# Patient Record
Sex: Female | Born: 1970 | Race: Black or African American | Hispanic: No | Marital: Single | Smoking: Never smoker
Health system: Southern US, Community
[De-identification: ages and names within clinical notes are randomized; demographics above are authoritative.]

## PROBLEM LIST (undated history)

## (undated) DIAGNOSIS — M069 Rheumatoid arthritis, unspecified: Secondary | ICD-10-CM

## (undated) DIAGNOSIS — A6009 Herpesviral infection of other urogenital tract: Secondary | ICD-10-CM

## (undated) DIAGNOSIS — J45909 Unspecified asthma, uncomplicated: Secondary | ICD-10-CM

## (undated) DIAGNOSIS — E119 Type 2 diabetes mellitus without complications: Secondary | ICD-10-CM

## (undated) DIAGNOSIS — T7840XA Allergy, unspecified, initial encounter: Secondary | ICD-10-CM

## (undated) DIAGNOSIS — I10 Essential (primary) hypertension: Secondary | ICD-10-CM

## (undated) HISTORY — DX: Unspecified asthma, uncomplicated: J45.909

## (undated) HISTORY — DX: Type 2 diabetes mellitus without complications: E11.9

## (undated) HISTORY — PX: DENTAL SURGERY: SHX609

## (undated) HISTORY — DX: Allergy, unspecified, initial encounter: T78.40XA

## (undated) HISTORY — PX: OVARY SURGERY: SHX727

---

## 1997-12-06 ENCOUNTER — Emergency Department (HOSPITAL_COMMUNITY): Admission: EM | Admit: 1997-12-06 | Discharge: 1997-12-07 | Payer: Self-pay | Admitting: Emergency Medicine

## 1998-09-12 ENCOUNTER — Ambulatory Visit (HOSPITAL_COMMUNITY): Admission: RE | Admit: 1998-09-12 | Discharge: 1998-09-12 | Payer: Self-pay | Admitting: Obstetrics and Gynecology

## 1998-09-12 ENCOUNTER — Encounter: Payer: Self-pay | Admitting: Obstetrics and Gynecology

## 1998-09-29 ENCOUNTER — Emergency Department (HOSPITAL_COMMUNITY): Admission: EM | Admit: 1998-09-29 | Discharge: 1998-09-29 | Payer: Self-pay | Admitting: Emergency Medicine

## 1999-01-24 ENCOUNTER — Emergency Department (HOSPITAL_COMMUNITY): Admission: EM | Admit: 1999-01-24 | Discharge: 1999-01-24 | Payer: Self-pay

## 1999-10-09 ENCOUNTER — Other Ambulatory Visit: Admission: RE | Admit: 1999-10-09 | Discharge: 1999-10-09 | Payer: Self-pay | Admitting: Obstetrics and Gynecology

## 2001-06-12 ENCOUNTER — Encounter: Admission: RE | Admit: 2001-06-12 | Discharge: 2001-06-12 | Payer: Self-pay | Admitting: Rheumatology

## 2001-06-12 ENCOUNTER — Encounter: Payer: Self-pay | Admitting: Rheumatology

## 2002-04-02 ENCOUNTER — Encounter: Payer: Self-pay | Admitting: Emergency Medicine

## 2002-04-02 ENCOUNTER — Emergency Department (HOSPITAL_COMMUNITY): Admission: EM | Admit: 2002-04-02 | Discharge: 2002-04-02 | Payer: Self-pay | Admitting: Emergency Medicine

## 2002-10-09 ENCOUNTER — Other Ambulatory Visit: Admission: RE | Admit: 2002-10-09 | Discharge: 2002-10-09 | Payer: Self-pay | Admitting: Family Medicine

## 2002-12-25 ENCOUNTER — Encounter: Payer: Self-pay | Admitting: Emergency Medicine

## 2002-12-25 ENCOUNTER — Emergency Department (HOSPITAL_COMMUNITY): Admission: EM | Admit: 2002-12-25 | Discharge: 2002-12-25 | Payer: Self-pay | Admitting: Emergency Medicine

## 2003-04-16 ENCOUNTER — Emergency Department (HOSPITAL_COMMUNITY): Admission: EM | Admit: 2003-04-16 | Discharge: 2003-04-17 | Payer: Self-pay | Admitting: Emergency Medicine

## 2004-02-26 ENCOUNTER — Other Ambulatory Visit: Admission: RE | Admit: 2004-02-26 | Discharge: 2004-02-26 | Payer: Self-pay | Admitting: Family Medicine

## 2004-12-24 ENCOUNTER — Other Ambulatory Visit: Admission: RE | Admit: 2004-12-24 | Discharge: 2004-12-24 | Payer: Self-pay | Admitting: Gynecology

## 2005-06-07 ENCOUNTER — Other Ambulatory Visit: Admission: RE | Admit: 2005-06-07 | Discharge: 2005-06-07 | Payer: Self-pay | Admitting: Gynecology

## 2005-11-24 ENCOUNTER — Other Ambulatory Visit: Admission: RE | Admit: 2005-11-24 | Discharge: 2005-11-24 | Payer: Self-pay | Admitting: Gynecology

## 2006-08-21 ENCOUNTER — Emergency Department (HOSPITAL_COMMUNITY): Admission: EM | Admit: 2006-08-21 | Discharge: 2006-08-21 | Payer: Self-pay | Admitting: Emergency Medicine

## 2006-09-02 ENCOUNTER — Encounter: Admission: RE | Admit: 2006-09-02 | Discharge: 2006-09-02 | Payer: Self-pay | Admitting: Family Medicine

## 2007-02-01 ENCOUNTER — Encounter: Admission: RE | Admit: 2007-02-01 | Discharge: 2007-04-12 | Payer: Self-pay | Admitting: Family Medicine

## 2007-04-11 ENCOUNTER — Ambulatory Visit (HOSPITAL_COMMUNITY): Admission: RE | Admit: 2007-04-11 | Discharge: 2007-04-11 | Payer: Self-pay | Admitting: Surgery

## 2007-04-20 ENCOUNTER — Encounter: Admission: RE | Admit: 2007-04-20 | Discharge: 2007-04-20 | Payer: Self-pay | Admitting: Surgery

## 2007-04-25 ENCOUNTER — Ambulatory Visit (HOSPITAL_COMMUNITY): Admission: RE | Admit: 2007-04-25 | Discharge: 2007-04-25 | Payer: Self-pay | Admitting: Surgery

## 2007-05-10 ENCOUNTER — Encounter: Admission: RE | Admit: 2007-05-10 | Discharge: 2007-06-07 | Payer: Self-pay | Admitting: Family Medicine

## 2007-08-17 ENCOUNTER — Encounter: Admission: RE | Admit: 2007-08-17 | Discharge: 2007-08-17 | Payer: Self-pay | Admitting: Family Medicine

## 2007-12-22 ENCOUNTER — Emergency Department (HOSPITAL_COMMUNITY): Admission: EM | Admit: 2007-12-22 | Discharge: 2007-12-22 | Payer: Self-pay | Admitting: Family Medicine

## 2008-03-18 ENCOUNTER — Emergency Department (HOSPITAL_COMMUNITY): Admission: EM | Admit: 2008-03-18 | Discharge: 2008-03-18 | Payer: Self-pay | Admitting: Family Medicine

## 2008-05-08 ENCOUNTER — Emergency Department (HOSPITAL_COMMUNITY): Admission: EM | Admit: 2008-05-08 | Discharge: 2008-05-08 | Payer: Self-pay | Admitting: Family Medicine

## 2008-08-14 ENCOUNTER — Emergency Department (HOSPITAL_COMMUNITY): Admission: EM | Admit: 2008-08-14 | Discharge: 2008-08-14 | Payer: Self-pay | Admitting: Family Medicine

## 2008-11-13 ENCOUNTER — Emergency Department (HOSPITAL_COMMUNITY): Admission: EM | Admit: 2008-11-13 | Discharge: 2008-11-13 | Payer: Self-pay | Admitting: Family Medicine

## 2009-01-09 ENCOUNTER — Encounter: Admission: RE | Admit: 2009-01-09 | Discharge: 2009-01-09 | Payer: Self-pay | Admitting: Family Medicine

## 2009-05-22 ENCOUNTER — Encounter: Admission: RE | Admit: 2009-05-22 | Discharge: 2009-05-22 | Payer: Self-pay | Admitting: Family Medicine

## 2009-06-16 ENCOUNTER — Emergency Department (HOSPITAL_COMMUNITY): Admission: EM | Admit: 2009-06-16 | Discharge: 2009-06-16 | Payer: Self-pay | Admitting: Emergency Medicine

## 2009-09-17 ENCOUNTER — Encounter: Admission: RE | Admit: 2009-09-17 | Discharge: 2009-09-17 | Payer: Self-pay | Admitting: Family Medicine

## 2010-01-22 ENCOUNTER — Encounter: Admission: RE | Admit: 2010-01-22 | Discharge: 2010-01-22 | Payer: Self-pay | Admitting: Family Medicine

## 2010-01-22 ENCOUNTER — Emergency Department (HOSPITAL_COMMUNITY): Admission: EM | Admit: 2010-01-22 | Discharge: 2010-01-22 | Payer: Self-pay | Admitting: Emergency Medicine

## 2010-01-28 ENCOUNTER — Encounter: Admission: RE | Admit: 2010-01-28 | Discharge: 2010-01-28 | Payer: Self-pay | Admitting: Family Medicine

## 2010-02-09 ENCOUNTER — Encounter: Admission: RE | Admit: 2010-02-09 | Discharge: 2010-02-09 | Payer: Self-pay | Admitting: Family Medicine

## 2010-04-03 ENCOUNTER — Inpatient Hospital Stay (HOSPITAL_COMMUNITY)
Admission: EM | Admit: 2010-04-03 | Discharge: 2010-04-08 | Payer: Self-pay | Source: Home / Self Care | Attending: Internal Medicine | Admitting: Internal Medicine

## 2010-04-03 ENCOUNTER — Encounter (INDEPENDENT_AMBULATORY_CARE_PROVIDER_SITE_OTHER): Payer: Self-pay | Admitting: Cardiology

## 2010-04-29 ENCOUNTER — Encounter
Admission: RE | Admit: 2010-04-29 | Discharge: 2010-04-29 | Payer: Self-pay | Source: Home / Self Care | Attending: Family Medicine | Admitting: Family Medicine

## 2010-05-03 ENCOUNTER — Encounter: Payer: Self-pay | Admitting: Rheumatology

## 2010-05-03 ENCOUNTER — Encounter: Payer: Self-pay | Admitting: Surgery

## 2010-05-06 ENCOUNTER — Encounter: Payer: Self-pay | Admitting: Internal Medicine

## 2010-05-06 ENCOUNTER — Encounter
Admission: RE | Admit: 2010-05-06 | Discharge: 2010-05-06 | Payer: Self-pay | Source: Home / Self Care | Attending: Family Medicine | Admitting: Family Medicine

## 2010-05-06 ENCOUNTER — Inpatient Hospital Stay (HOSPITAL_COMMUNITY)
Admission: EM | Admit: 2010-05-06 | Discharge: 2010-05-08 | Payer: Self-pay | Source: Home / Self Care | Attending: Internal Medicine | Admitting: Internal Medicine

## 2010-05-06 LAB — CBC
HCT: 29.6 % — ABNORMAL LOW (ref 36.0–46.0)
Hemoglobin: 9.6 g/dL — ABNORMAL LOW (ref 12.0–15.0)
MCHC: 32.4 g/dL (ref 30.0–36.0)
MCV: 80.9 fL (ref 78.0–100.0)
RDW: 17 % — ABNORMAL HIGH (ref 11.5–15.5)

## 2010-05-06 LAB — DIFFERENTIAL
Basophils Relative: 0 % (ref 0–1)
Eosinophils Relative: 0 % (ref 0–5)
Lymphs Abs: 2.4 10*3/uL (ref 0.7–4.0)
Monocytes Absolute: 2.1 10*3/uL — ABNORMAL HIGH (ref 0.1–1.0)
Monocytes Relative: 13 % — ABNORMAL HIGH (ref 3–12)

## 2010-05-06 LAB — COMPREHENSIVE METABOLIC PANEL
ALT: 15 U/L (ref 0–35)
AST: 32 U/L (ref 0–37)
Albumin: 3.1 g/dL — ABNORMAL LOW (ref 3.5–5.2)
Alkaline Phosphatase: 90 U/L (ref 39–117)
Calcium: 8.5 mg/dL (ref 8.4–10.5)
GFR calc Af Amer: >60 mL/min (ref >60)
Glucose, Bld: 116 mg/dL — ABNORMAL HIGH (ref 70–99)
Potassium: 3.4 mEq/L — ABNORMAL LOW (ref 3.5–5.1)
Sodium: 141 mEq/L (ref 135–145)
Total Protein: 9.1 g/dL — ABNORMAL HIGH (ref 6.0–8.3)

## 2010-05-07 LAB — CARDIAC PANEL(CRET KIN+CKTOT+MB+TROPI)
Relative Index: INVALID (ref 0.0–2.5)
Relative Index: INVALID (ref 0.0–2.5)
Total CK: 89 U/L (ref 7–177)
Total CK: 80 U/L (ref 7–177)
Troponin I: 0.02 ng/mL (ref 0.00–0.06)

## 2010-05-07 LAB — CBC
HCT: 26.9 % — ABNORMAL LOW (ref 36.0–46.0)
Hemoglobin: 8.5 g/dL — ABNORMAL LOW (ref 12.0–15.0)
RBC: 3.33 MIL/uL — ABNORMAL LOW (ref 3.87–5.11)

## 2010-05-07 LAB — MAGNESIUM: Magnesium: 1.7 mg/dL (ref 1.5–2.5)

## 2010-05-07 LAB — DIFFERENTIAL
Basophils Absolute: 0 10*3/uL (ref 0.0–0.1)
Basophils Relative: 0 % (ref 0–1)
Lymphocytes Relative: 10 % — ABNORMAL LOW (ref 12–46)
Monocytes Relative: 8 % (ref 3–12)
Neutro Abs: 10.9 10*3/uL — ABNORMAL HIGH (ref 1.7–7.7)
Neutrophils Relative %: 81 % — ABNORMAL HIGH (ref 43–77)

## 2010-05-07 LAB — PHOSPHORUS: Phosphorus: 2.4 mg/dL (ref 2.3–4.6)

## 2010-05-07 LAB — BASIC METABOLIC PANEL
Chloride: 108 mEq/L (ref 96–112)
GFR calc non Af Amer: >60 mL/min (ref >60)
Potassium: 3.1 mEq/L — ABNORMAL LOW (ref 3.5–5.1)
Sodium: 141 mEq/L (ref 135–145)

## 2010-05-07 LAB — LACTIC ACID, PLASMA: Lactic Acid, Venous: 0.9 mmol/L (ref 0.5–2.2)

## 2010-05-08 LAB — COMPREHENSIVE METABOLIC PANEL
BUN: 10 mg/dL (ref 6–23)
CO2: 23 mEq/L (ref 19–32)
Calcium: 7.9 mg/dL — ABNORMAL LOW (ref 8.4–10.5)
Creatinine, Ser: 0.67 mg/dL (ref 0.4–1.2)
GFR calc non Af Amer: >60 mL/min (ref >60)
Glucose, Bld: 296 mg/dL — ABNORMAL HIGH (ref 70–99)
Sodium: 138 mEq/L (ref 135–145)
Total Protein: 7.6 g/dL (ref 6.0–8.3)

## 2010-05-08 LAB — DIFFERENTIAL
Basophils Absolute: 0 10*3/uL (ref 0.0–0.1)
Eosinophils Relative: 0 % (ref 0–5)
Lymphocytes Relative: 7 % — ABNORMAL LOW (ref 12–46)
Monocytes Absolute: 0.7 10*3/uL (ref 0.1–1.0)
Monocytes Relative: 5 % (ref 3–12)

## 2010-05-08 LAB — CBC
HCT: 26.2 % — ABNORMAL LOW (ref 36.0–46.0)
MCH: 24.8 pg — ABNORMAL LOW (ref 26.0–34.0)
MCHC: 30.2 g/dL (ref 30.0–36.0)
RDW: 17.1 % — ABNORMAL HIGH (ref 11.5–15.5)

## 2010-05-08 LAB — MAGNESIUM: Magnesium: 1.9 mg/dL (ref 1.5–2.5)

## 2010-05-08 LAB — PHOSPHORUS: Phosphorus: 2.2 mg/dL — ABNORMAL LOW (ref 2.3–4.6)

## 2010-05-08 NOTE — Discharge Summary (Signed)
[name], [name]                 ACCOUNT NO.:  [id number]  MEDICAL RECORD NO.:  [id number]          PATIENT TYPE:  INP  LOCATION:  1443                         FACILITY:  [facility]  PHYSICIAN:  Rock Nephew, MD       DATE OF BIRTH:  05-17-1970  DATE OF ADMISSION:  05/06/2010 DATE OF DISCHARGE:  05/08/2010                        DISCHARGE SUMMARY - REFERRING   PRIMARY CARE PHYSICIAN:  The patient's primary care physician is Carola J. Gerri Spore, M.D.  CARDIOLOGIST:  Armanda Magic, M.D.  DISCHARGE DIAGNOSES:  The patient's discharge diagnosis are as follows: 1. Bilateral pneumonia with small left pleural effusion. 2. Morbid obesity. 3. History of rheumatoid arthritis and lupus. 4. History of asthma. 5. History of chronic back pain. 6. History of gastroesophageal reflux disease. 7. History of pericardial effusion. 8. History of recent genital herpes.  DISCHARGE MEDICATIONS:  Discharge medications for the patient are as follows: 1. Levofloxacin 500 mg by mouth p.o. daily. 2. Prednisone taper 60 mg for 2 days, 50 mg for 2 days, 40 mg for 2     days, 30 mg for 2 days, 20 mg for 2 days, 10 mg for 2 days. 3. Acetaminophen 500 mg 2 to 3 tablets 2 tablets by mouth every 6     hours as needed for pain. 4. Acyclovir 200 mg p.o. daily. 5. Albuterol inhaler 2 puffs inhaled every 4 hours as needed for     shortness of breath. 6. Alka-Seltzer Plus Cold and Cough 2 tablets over-the-counter every 4     hours as needed. 7. Excedrin 2 to 3 tablets by mouth every 12 hours as needed. 8. Flonase 2 sprays nasally daily as needed. 9. Indomethacin 500 mg 1 capsule by mouth 3 times a day with meals. 10.Ibuprofen 600 mg by mouth 3 times a day as needed. 11.Mucinex 600 mg 1 tablet by mouth every 12 hours as needed. 12.Plaquenil 200 mg 1 tablet by mouth daily. 13.Promethazine with codeine 6.25/10 mg 5 mL by mouth every 6 hours as     needed. 14.Robaxin 500 mg 1 tablet by mouth every 6 hours as  needed. 15.Symbicort 80/4.5 mcg 2 puffs inhaled twice daily as needed. 16.Tramadol 50 mg 1 tablet by mouth every 6 hours as needed. 17.Veramyst 2 sprays inhaled every 4 hours as needed.  DISPOSITION:  The patient is discharged home.  DIET:  The patient's diet is regular.  PROCEDURES:  The patient had a CT angiogram of the chest which showed limited evaluation for pulmonary embolism secondary to the patient's body habitus.  No pulmonary embolism in the central or segmental pulmonary arteries.  Consolidation noted in the lingula and left lower lobe with left greater than right pleural effusion suggesting infection. Improved right lower lobe airspace disease, cardiomegaly, and small pericardial effusion, unchanged.  The patient also had a 2-D echocardiogram ordered that was cancelled by the echocardiogram department secondary to having an echocardiogram done in December 2011.  CONSULTATIONS:  Consultations on this case, none.  FOLLOWUP:  The patient should follow up with Dr. Otilio Connors. [place]ann in about 1 week.  The patient should follow up with Dr. Gloris Manchester  Turner in 1 to 2 weeks.  INITIAL HISTORY AND PHYSICAL:  Chief Complaint:  Shortness of breath. This 40 year old female who was recently admitted in December of this year with shortness of breath diagnosed with bilateral pneumonia and pleural as well as pericardial effusion.  She has a baseline history of lupus as well as rheumatoid arthritis.  At the time of discharge, the patient was doing fine.  She was discharged on 5 days of oral Avelox which she completed.  After completion, however, the patient continued to have recurrent shortness of breath and worsening symptoms.  She was referred back to Dr. Armanda Magic with possible recurrent pericardial effusion.  Evaluation there showed that she had no new pericardial effusion, rather she had another organizing pneumonia in her lungs.  The patient is subsequently admitted to the ED for  further management.  In the ED, her oxygen saturation dropped into 80s after being initially discharged home.  HOSPITAL COURSE: 1. Bilateral pneumonia with pleural effusion.  The patient was placed     on Levaquin as well as vancomycin.  The patient's leukocytosis     improved.  The patient was afebrile and seemed ready for discharge. 2. Morbid obesity.  The patient was counseled. 3. History of rheumatoid arthritis plus lupus.  The patient received     Solu-Medrol during the hospitalization.  She will go home on a     prednisone taper.  She takes Plaquenil at home for rheumatoid     arthritis and lupus. 4. History of asthma.  The patient is on nebulizations.  She also     received steroids. 5. Chronic back pain.  The patient has a history of chronic back pain     which is stable. 6. GERD.  The patient is on a PPI. 7. DVT prophylaxis.  The patient received Lovenox.  The patient also     has a history of genital herpes.  She takes acyclovir at home.  It     was also noted that the patient has more multiple medications that     have Tylenol in it.  The patient was instructed not to take more     than 4 g of Tylenol in a 24-hour period.  The patient's vital signs at time of discharge were temperature 97.6, pulse 81, respiratory rate 18, blood pressure 117/73, 92% saturation on room air.  Please note this is not an official document until electronically signed.     Rock Nephew, MD     NH/MEDQ  D:  05/08/2010  T:  05/08/2010  Job:  [id number]  cc:   Otilio Connors. Gerri Spore, M.D. Fax: [phone]  Armanda Magic, M.D. Fax: [phone]  Electronically Signed by Rock Nephew MD on 05/08/2010 03:49:59 PM

## 2010-05-14 LAB — CULTURE, BLOOD (ROUTINE X 2)
Culture  Setup Time: 201201271731
Culture: NO GROWTH

## 2010-05-18 ENCOUNTER — Other Ambulatory Visit: Payer: Self-pay | Admitting: Family Medicine

## 2010-05-18 ENCOUNTER — Ambulatory Visit
Admission: RE | Admit: 2010-05-18 | Discharge: 2010-05-18 | Disposition: A | Discharge status: Home / Self Care | Payer: Medicare PPO | Source: Ambulatory Visit | Attending: Family Medicine | Admitting: Family Medicine

## 2010-05-18 DIAGNOSIS — J189 Pneumonia, unspecified organism: Secondary | ICD-10-CM

## 2010-05-27 ENCOUNTER — Other Ambulatory Visit: Payer: Self-pay | Admitting: Family Medicine

## 2010-05-27 ENCOUNTER — Ambulatory Visit
Admission: RE | Admit: 2010-05-27 | Discharge: 2010-05-27 | Disposition: A | Discharge status: Home / Self Care | Payer: Medicare PPO | Source: Ambulatory Visit | Attending: Family Medicine | Admitting: Family Medicine

## 2010-05-27 ENCOUNTER — Encounter: Payer: Self-pay | Admitting: Internal Medicine

## 2010-05-27 DIAGNOSIS — R05 Cough: Secondary | ICD-10-CM

## 2010-05-27 DIAGNOSIS — R059 Cough, unspecified: Secondary | ICD-10-CM

## 2010-06-04 DIAGNOSIS — J45909 Unspecified asthma, uncomplicated: Secondary | ICD-10-CM | POA: Insufficient documentation

## 2010-06-05 ENCOUNTER — Institutional Professional Consult (permissible substitution) (INDEPENDENT_AMBULATORY_CARE_PROVIDER_SITE_OTHER): Payer: Medicare PPO | Admitting: Internal Medicine

## 2010-06-05 ENCOUNTER — Other Ambulatory Visit: Payer: Medicare PPO

## 2010-06-05 ENCOUNTER — Other Ambulatory Visit: Payer: Self-pay | Admitting: Internal Medicine

## 2010-06-05 ENCOUNTER — Encounter: Payer: Self-pay | Admitting: Internal Medicine

## 2010-06-05 DIAGNOSIS — J45909 Unspecified asthma, uncomplicated: Secondary | ICD-10-CM

## 2010-06-05 LAB — SEDIMENTATION RATE: Sed Rate: 58 mm/hr — ABNORMAL HIGH (ref 0–22)

## 2010-06-05 LAB — CBC WITH DIFFERENTIAL/PLATELET
Basophils Absolute: 0.1 10*3/uL (ref 0.0–0.1)
Eosinophils Relative: 5.3 % — ABNORMAL HIGH (ref 0.0–5.0)
HCT: 30.9 % — ABNORMAL LOW (ref 36.0–46.0)
Hemoglobin: 10.4 g/dL — ABNORMAL LOW (ref 12.0–15.0)
Lymphocytes Relative: 30.5 % (ref 12.0–46.0)
Lymphs Abs: 2.4 10*3/uL (ref 0.7–4.0)
Monocytes Relative: 12.3 % — ABNORMAL HIGH (ref 3.0–12.0)
Neutro Abs: 3.9 10*3/uL (ref 1.4–7.7)
RDW: 19.8 % — ABNORMAL HIGH (ref 11.5–14.6)
WBC: 7.7 10*3/uL (ref 4.5–10.5)

## 2010-06-08 ENCOUNTER — Telehealth: Payer: Self-pay | Admitting: Internal Medicine

## 2010-06-09 NOTE — Assessment & Plan Note (Signed)
Summary: Pulmonary/ new pt eval  > hfa 75% p coaching   Visit Type:  Initial Consult Copy to:  Dr. Armanda Magic Primary Provider/Referring Provider:  Dr. Clyde Canterbury  CC:  Recurrent PNA.  History of Present Illness: 40 yobf never smoker with dx of asthma as teenager requiring intermittent treatment since then.  June 05, 2010  1st pulmonary office eval ov cc abupt onset in early  December 2011 with severe dyspnea diifferent because didn't respond and began having chest tightness and cough > yellow hospitalized Apr 04 2011 admit [facility] x one week then better then readmitted 3 weeks later [facility] then better  x still felt a little tight then worse again starting x one week cough coming back and low fever rx avelox x 5 days.    Presently Pt denies any significant sore throat, dysphagia, itching, sneezing,  nasal congestion or excess secretions,  fever, chills, sweats, unintended wt loss, pleuritic or exertional cp, hempoptysis, change in activity tolerance  orthopnea pnd or leg swelling Pt also denies any obvious fluctuation in symptoms with weather or environmental change or other alleviating or aggravating factors.       Current Medications (verified): 1)  Acyclovir 200 Mg Caps (Acyclovir) .Marland Kitchen.. 1 Once Daily 2)  Tramadol Hcl 50 Mg Tabs (Tramadol Hcl) .Marland Kitchen.. 1 Every 6 Hrs As Needed 3)  Nexium 40 Mg Cpdr (Esomeprazole Magnesium) .Marland Kitchen.. 1 Once Daily As Needed 4)  Mucinex 600 Mg Xr12h-Tab (Guaifenesin) .Marland Kitchen.. 1 Every 12 Hrs As Neeed 5)  Symbicort 80-4.5 Mcg/act Aero (Budesonide-Formoterol Fumarate) .... 2 Puffs Two Times A Day As Needed 6)  Promethazine-Codeine 6.25-10 Mg/20ml Syrp (Promethazine-Codeine) .Marland Kitchen.. 1 Tsp Every 6 Hrs As Needed 7)  Avelox 400 Mg Tabs (Moxifloxacin Hcl) .Marland Kitchen.. 1 Once Daily X 10 Days- Per Dr Mayford Knife 8)  Plaquenil 200 Mg Tabs (Hydroxychloroquine Sulfate) .... 2 At Bedtime 9)  Veramyst 27.5 Mcg/spray Susp (Fluticasone Furoate) .... 2 Sprays Each Nostril Once Daily 10)  Flonase 50 Mcg/act  Susp (Fluticasone Propionate) .... 2 Sprays Each Nostril Once Daily 11)  Tessalon Perles 100 Mg Caps (Benzonatate) .Marland Kitchen.. 1 Every 8 Hrs As Needed For Cough  Allergies (verified): 1)  ! Pcn 2)  ! Biaxin 3)  ! Hydrocodone  Past History:  Past Medical History: Allergies  Asthma     - HFA 50% June 05, 2010  Genital Herpes RA-Sees [facility]    Family History: Heart dz- Mother DM- Mother Breast CA- Mother  Social History: Never smoker No ETOH [place] No children Disabled Lives with Mother  Review of Systems       The patient complains of shortness of breath with activity, shortness of breath at rest, productive cough, chest pain, irregular heartbeats, weight change, abdominal pain, headaches, sneezing, and joint stiffness or pain.  The patient denies non-productive cough, coughing up blood, acid heartburn, indigestion, loss of appetite, difficulty swallowing, sore throat, tooth/dental problems, nasal congestion/difficulty breathing through nose, itching, ear ache, anxiety, depression, hand/feet swelling, rash, change in color of mucus, and fever.    Vital Signs:  Patient profile:   40 year old female Height:      61 inches Weight:      250 pounds BMI:     47.41 O2 Sat:      96 % on Room air Temp:     98.9 degrees F oral Pulse rate:   94 / minute BP sitting:   160 / 98  (left arm)  Vitals Entered By: Vernie Murders (June 05, 2010 10:48  AM)  O2 Flow:  Room air  Physical Exam  Additional Exam:  obese very cushingnoid appearing bf nad wt 250 June 05, 2010 HEENT: nl dentition, turbinates, and orophanx. Nl external ear canals without cough reflex NECK :  without JVD/Nodes/TM/ nl carotid upstrokes bilaterally LUNGS: no acc muscle use, clear to A and P bilaterally without cough on insp or exp maneuvers CV:  RRR  no s3 or murmur or increase in P2, no edema   ABD:  soft and nontender with nl excursion in the supine position. No bruits or organomegaly, bowel sounds  nl MS:  warm without deformities, calf tenderness, cyanosis or clubbing SKIN: warm and dry without lesions   NEURO:  alert, approp, no deficits     White Cell Count          7.7 K/uL                    4.5-10.5   Red Cell Count       [L]  3.83 Mil/uL                 3.87-5.11   Hemoglobin           [L]  10.4 g/dL                   91.4-78.2   Hematocrit           [L]  30.9 %                      36.0-46.0   MCV                       80.6 fl                     78.0-100.0   MCHC                      33.7 g/dL                   95.6-21.3   RDW                  [H]  19.8 %                      11.5-14.6   Platelet Count       [H]  593.0 K/uL                  150.0-400.0   Neutrophil %              50.2 %                      43.0-77.0   Lymphocyte %              30.5 %                      12.0-46.0   Monocyte %           [H]  12.3 %                      3.0-12.0   Eosinophils%         [H]  5.3 %  0.0-5.0   Basophils %               1.7 %                       0.0-3.0   Neutrophill Absolute      3.9 K/uL                    1.4-7.7   Lymphocyte Absolute       2.4 K/uL                    0.7-4.0   Monocyte Absolute         1.0 K/uL                    0.1-1.0  Eosinophils, Absolute                             0.4 K/uL                    0.0-0.7   Basophils Absolute        0.1 K/uL                    0.0-0.1  Tests: (2) Sed Rate (ESR)   Sed Rate             [H]  58 mm/hr                    0-22  CXR  Procedure date:  06/05/2010  Findings:        Comparison: Chest x-ray of 05/18/2010    Findings: The lungs are clear.  Mediastinal contours appear normal.   The heart is within upper limits of normal.  No bony abnormality is   seen.    IMPRESSION:   No active lung disease.  Stable chest x-ray.    Impression & Recommendations:  Problem # 1:  ASTHMA (ICD-493.90)  DDX of  difficult airways managment all start with A and  include Adherence, Ace Inhibitors, Acid  Reflux, Active Sinus Disease, Alpha 1 Antitripsin deficiency, Anxiety masquerading as Airways dz,  ABPA,  allergy(esp in young), Aspiration (esp in elderly), Adverse effects of DPI,  Active smokers, plus two Bs  = Bronchiectasis and Beta blocker use..and one C= CHF    Also in ddx is RA assoc lung dz, both bronchiolits and BOOP - does not appear to have ILD  Adherence: I spent extra time with the patient today explaining optimal mdi  technique.  This improved from  50-75% p coaching  ? acid reflux esp given atypical assoc cp/ chest tightness : See instructions for specific recommendations    Each maintenance medication was reviewed in detail including most importantly the difference between maintenance and as needed and under what circumstances the prns are to be used. struggling with concept of medication reconciliation.   To keep things simple, I have asked the patient to first separate medicines that are perceived as maintenance, that is to be taken daily "no matter what", from those medicines that are taken on only on an as-needed basis and I have given the patient examples of both, and then return to see our NP to generate a  detailed  medication calendar which should be followed until the next physician sees the patient and updates it.   Once we're sure that we're all reading  from the same page in terms of medication admiistration, she needs to be scheduled to follow up with me with pft's  Medications Added to Medication List This Visit: 1)  Acyclovir 200 Mg Caps (Acyclovir) .Marland Kitchen.. 1 once daily 2)  Nexium 40 Mg Cpdr (Esomeprazole magnesium) .... Take  one 30-60 min before first meal of the day 3)  Nexium 40 Mg Cpdr (Esomeprazole magnesium) .Marland Kitchen.. 1 once daily as needed 4)  Symbicort 80-4.5 Mcg/act Aero (Budesonide-formoterol fumarate) .... 2 puffs two times a day as needed 5)  Plaquenil 200 Mg Tabs (Hydroxychloroquine sulfate) .... 2 at bedtime 6)  Veramyst 27.5 Mcg/spray Susp (Fluticasone furoate)  .... 2 sprays each nostril once daily 7)  Flonase 50 Mcg/act Susp (Fluticasone propionate) .... 2 sprays each nostril once daily 8)  Tessalon Perles 100 Mg Caps (Benzonatate) .Marland Kitchen.. 1 every 8 hrs as needed for cough 9)  Tramadol Hcl 50 Mg Tabs (Tramadol hcl) .Marland Kitchen.. 1 every 6 hrs as needed 10)  Tramadol Hcl 50 Mg Tabs (Tramadol hcl) .Marland Kitchen.. 1-2 up to every 4 hours for bad cough or chest pain 11)  Mucinex 600 Mg Xr12h-tab (Guaifenesin) .Marland Kitchen.. 1 every 12 hrs as neeed 12)  Promethazine-codeine 6.25-10 Mg/10ml Syrp (Promethazine-codeine) .Marland Kitchen.. 1 tsp every 6 hrs as needed 13)  Pepcid 20 Mg Tabs (Famotidine) .... Take one by mouth at bedtime 14)  Avelox 400 Mg Tabs (Moxifloxacin hcl) .Marland Kitchen.. 1 once daily x 10 days- per dr turner 15)  Prednisone 10 Mg Tabs (Prednisone) .... 4 each am x 2days, 2x2days, 1x2days and stop  Other Orders: TLB-CBC Platelet - w/Differential (85025-CBCD) TLB-Sedimentation Rate (ESR) (85652-ESR) New Patient Level V (16109)  Patient Instructions: 1)  Pepcid 20 mg at bedtime 2)  GERD (REFLUX)  is a common cause of respiratory symptoms. It commonly presents without heartburn and can be treated with medication, but also with lifestyle changes including avoidance of late meals, excessive alcohol, smoking cessation, and avoid fatty foods, chocolate, peppermint, colas, red wine, and acidic juices such as orange juice. NO MINT OR MENTHOL PRODUCTS SO NO COUGH DROPS  3)  USE SUGARLESS CANDY INSTEAD (jolley ranchers)  4)  NO OIL BASED VITAMINS  5)   Think of your medications in 3 separate categories and keep them separate:  6)  a) The ones you take no matter what daily on a scheduled basis 7)  b)  The ones you only take if needed for specific problems 8)  c) The ones you take for a short course and stop, like antibiotics and prednisone. 9)  If condition worsens take prednisone  4 each am x 2days, 2x2days, 1x2days and stop  10)  See Tammy NP w/in 1 weeks with all your medications, even over the  counter meds, separated in two separate bags, the ones you take no matter what vs the ones you stop once you feel better and take only as needed.  She will generate for you a new user friendly medication calendar that will put [place] all on the same page re: your medication use.    CXR  Procedure date:  06/05/2010  Findings:        Comparison: Chest x-ray of 05/18/2010    Findings: The lungs are clear.  Mediastinal contours appear normal.   The heart is within upper limits of normal.  No bony abnormality is   seen.    IMPRESSION:   No active lung disease.  Stable chest x-ray.

## 2010-06-12 ENCOUNTER — Encounter: Payer: Medicare PPO | Admitting: Adult Health

## 2010-06-15 ENCOUNTER — Inpatient Hospital Stay (HOSPITAL_COMMUNITY)
Admission: EM | Admit: 2010-06-15 | Discharge: 2010-06-22 | DRG: 196 | Disposition: A | Discharge status: Home / Self Care | Payer: Medicare PPO | Attending: Internal Medicine | Admitting: Internal Medicine

## 2010-06-15 ENCOUNTER — Emergency Department (HOSPITAL_COMMUNITY): Payer: Medicare PPO

## 2010-06-15 DIAGNOSIS — R079 Chest pain, unspecified: Secondary | ICD-10-CM

## 2010-06-15 DIAGNOSIS — I509 Heart failure, unspecified: Secondary | ICD-10-CM | POA: Diagnosis present

## 2010-06-15 DIAGNOSIS — M0510 Rheumatoid lung disease with rheumatoid arthritis of unspecified site: Principal | ICD-10-CM | POA: Diagnosis present

## 2010-06-15 DIAGNOSIS — E876 Hypokalemia: Secondary | ICD-10-CM | POA: Diagnosis present

## 2010-06-15 DIAGNOSIS — M3500 Sicca syndrome, unspecified: Secondary | ICD-10-CM | POA: Diagnosis present

## 2010-06-15 DIAGNOSIS — R918 Other nonspecific abnormal finding of lung field: Secondary | ICD-10-CM | POA: Diagnosis present

## 2010-06-15 DIAGNOSIS — M0560 Rheumatoid arthritis of unspecified site with involvement of other organs and systems: Secondary | ICD-10-CM | POA: Diagnosis present

## 2010-06-15 DIAGNOSIS — R059 Cough, unspecified: Secondary | ICD-10-CM | POA: Diagnosis present

## 2010-06-15 DIAGNOSIS — I5023 Acute on chronic systolic (congestive) heart failure: Secondary | ICD-10-CM | POA: Diagnosis present

## 2010-06-15 DIAGNOSIS — J90 Pleural effusion, not elsewhere classified: Secondary | ICD-10-CM | POA: Diagnosis present

## 2010-06-15 DIAGNOSIS — R000 Tachycardia, unspecified: Secondary | ICD-10-CM | POA: Diagnosis present

## 2010-06-15 DIAGNOSIS — R05 Cough: Secondary | ICD-10-CM | POA: Diagnosis present

## 2010-06-15 DIAGNOSIS — R6510 Systemic inflammatory response syndrome (SIRS) of non-infectious origin without acute organ dysfunction: Secondary | ICD-10-CM | POA: Diagnosis present

## 2010-06-15 DIAGNOSIS — R509 Fever, unspecified: Secondary | ICD-10-CM

## 2010-06-15 DIAGNOSIS — J45909 Unspecified asthma, uncomplicated: Secondary | ICD-10-CM | POA: Diagnosis present

## 2010-06-15 DIAGNOSIS — M329 Systemic lupus erythematosus, unspecified: Secondary | ICD-10-CM | POA: Diagnosis present

## 2010-06-15 DIAGNOSIS — A6000 Herpesviral infection of urogenital system, unspecified: Secondary | ICD-10-CM | POA: Diagnosis present

## 2010-06-15 DIAGNOSIS — K219 Gastro-esophageal reflux disease without esophagitis: Secondary | ICD-10-CM | POA: Diagnosis present

## 2010-06-15 DIAGNOSIS — R197 Diarrhea, unspecified: Secondary | ICD-10-CM | POA: Diagnosis not present

## 2010-06-15 DIAGNOSIS — R0902 Hypoxemia: Secondary | ICD-10-CM | POA: Diagnosis present

## 2010-06-15 LAB — BASIC METABOLIC PANEL
Chloride: 99 mEq/L (ref 96–112)
Potassium: 4.2 mEq/L (ref 3.5–5.1)
Sodium: 136 mEq/L (ref 135–145)

## 2010-06-15 LAB — URINALYSIS, ROUTINE W REFLEX MICROSCOPIC
Bilirubin Urine: NEGATIVE
Glucose, UA: NEGATIVE mg/dL
Hgb urine dipstick: NEGATIVE
Specific Gravity, Urine: >1.046 — ABNORMAL HIGH (ref 1.005–1.030)
pH: 5.5 (ref 5.0–8.0)

## 2010-06-15 LAB — DIFFERENTIAL
Basophils Absolute: 0 10*3/uL (ref 0.0–0.1)
Eosinophils Relative: 0 % (ref 0–5)
Lymphocytes Relative: 27 % (ref 12–46)
Lymphs Abs: 2.9 10*3/uL (ref 0.7–4.0)
Monocytes Absolute: 1.2 10*3/uL — ABNORMAL HIGH (ref 0.1–1.0)
Neutro Abs: 6.8 10*3/uL (ref 1.7–7.7)

## 2010-06-15 LAB — LACTIC ACID, PLASMA: Lactic Acid, Venous: 1.5 mmol/L (ref 0.5–2.2)

## 2010-06-15 LAB — BLOOD GAS, ARTERIAL
Drawn by: 257701
O2 Content: 2 L/min
pCO2 arterial: 38.9 mmHg (ref 35.0–45.0)
pH, Arterial: 7.438 — ABNORMAL HIGH (ref 7.350–7.400)
pO2, Arterial: 77.1 mmHg — ABNORMAL LOW (ref 80.0–100.0)

## 2010-06-15 LAB — APTT: aPTT: 34 seconds (ref 24–37)

## 2010-06-15 LAB — BRAIN NATRIURETIC PEPTIDE: Pro B Natriuretic peptide (BNP): 199 pg/mL — ABNORMAL HIGH (ref 0.0–100.0)

## 2010-06-15 LAB — PROTIME-INR: INR: 1.11 (ref 0.00–1.49)

## 2010-06-15 LAB — CBC
HCT: 33.8 % — ABNORMAL LOW (ref 36.0–46.0)
Hemoglobin: 10.3 g/dL — ABNORMAL LOW (ref 12.0–15.0)
MCV: 85.6 fL (ref 78.0–100.0)
WBC: 11 10*3/uL — ABNORMAL HIGH (ref 4.0–10.5)

## 2010-06-15 LAB — CK TOTAL AND CKMB (NOT AT ARMC)
CK, MB: 0.2 ng/mL — ABNORMAL LOW (ref 0.3–4.0)
Relative Index: INVALID (ref 0.0–2.5)
Total CK: 92 U/L (ref 7–177)

## 2010-06-15 LAB — PROCALCITONIN: Procalcitonin: 0.28 ng/mL

## 2010-06-15 LAB — URINE MICROSCOPIC-ADD ON

## 2010-06-15 MED ORDER — IOHEXOL 300 MG/ML  SOLN
100.0000 mL | Freq: Once | INTRAMUSCULAR | Status: AC | PRN
  Administered 2010-06-15: 100 mL via INTRAVENOUS

## 2010-06-16 DIAGNOSIS — J984 Other disorders of lung: Secondary | ICD-10-CM

## 2010-06-16 DIAGNOSIS — A419 Sepsis, unspecified organism: Secondary | ICD-10-CM

## 2010-06-16 DIAGNOSIS — M329 Systemic lupus erythematosus, unspecified: Secondary | ICD-10-CM

## 2010-06-16 LAB — COMPREHENSIVE METABOLIC PANEL
AST: 19 U/L (ref 0–37)
Albumin: 3 g/dL — ABNORMAL LOW (ref 3.5–5.2)
Alkaline Phosphatase: 135 U/L — ABNORMAL HIGH (ref 39–117)
BUN: 10 mg/dL (ref 6–23)
Chloride: 102 mEq/L (ref 96–112)
Creatinine, Ser: 0.76 mg/dL (ref 0.4–1.2)
GFR calc Af Amer: >60 mL/min (ref >60)
Potassium: 3.3 mEq/L — ABNORMAL LOW (ref 3.5–5.1)
Total Protein: 8.8 g/dL — ABNORMAL HIGH (ref 6.0–8.3)

## 2010-06-16 LAB — RAPID URINE DRUG SCREEN, HOSP PERFORMED
Amphetamines: NOT DETECTED
Tetrahydrocannabinol: NOT DETECTED

## 2010-06-16 LAB — CARDIAC PANEL(CRET KIN+CKTOT+MB+TROPI)
Relative Index: INVALID (ref 0.0–2.5)
Relative Index: INVALID (ref 0.0–2.5)
Total CK: 90 U/L (ref 7–177)
Total CK: 67 U/L (ref 7–177)
Troponin I: 0.02 ng/mL (ref 0.00–0.06)

## 2010-06-16 LAB — URINE CULTURE: Culture: NO GROWTH

## 2010-06-16 LAB — URINALYSIS, ROUTINE W REFLEX MICROSCOPIC
Nitrite: NEGATIVE
Specific Gravity, Urine: 1.035 — ABNORMAL HIGH (ref 1.005–1.030)
Urobilinogen, UA: 1 mg/dL (ref 0.0–1.0)

## 2010-06-16 LAB — TSH: TSH: 3.114 u[IU]/mL (ref 0.350–4.500)

## 2010-06-16 LAB — CBC
Hemoglobin: 9.2 g/dL — ABNORMAL LOW (ref 12.0–15.0)
MCH: 25.6 pg — ABNORMAL LOW (ref 26.0–34.0)
Platelets: 530 10*3/uL — ABNORMAL HIGH (ref 150–400)
RBC: 3.59 MIL/uL — ABNORMAL LOW (ref 3.87–5.11)

## 2010-06-16 LAB — DIFFERENTIAL
Basophils Absolute: 0 10*3/uL (ref 0.0–0.1)
Basophils Relative: 0 % (ref 0–1)
Eosinophils Absolute: 0 10*3/uL (ref 0.0–0.7)
Monocytes Relative: 14 % — ABNORMAL HIGH (ref 3–12)
Neutro Abs: 7.5 10*3/uL (ref 1.7–7.7)
Neutrophils Relative %: 67 % (ref 43–77)

## 2010-06-16 LAB — MRSA PCR SCREENING: MRSA by PCR: POSITIVE — AB

## 2010-06-16 LAB — URINE MICROSCOPIC-ADD ON

## 2010-06-17 ENCOUNTER — Institutional Professional Consult (permissible substitution): Payer: Medicare PPO | Admitting: Pulmonary Disease

## 2010-06-17 LAB — PROCALCITONIN: Procalcitonin: 0.3 ng/mL

## 2010-06-17 LAB — BASIC METABOLIC PANEL
CO2: 24 mEq/L (ref 19–32)
Calcium: 8.3 mg/dL — ABNORMAL LOW (ref 8.4–10.5)
Sodium: 136 mEq/L (ref 135–145)

## 2010-06-17 LAB — CBC
Hemoglobin: 9.2 g/dL — ABNORMAL LOW (ref 12.0–15.0)
MCHC: 29.6 g/dL — ABNORMAL LOW (ref 30.0–36.0)
WBC: 13.6 10*3/uL — ABNORMAL HIGH (ref 4.0–10.5)

## 2010-06-17 LAB — C4 COMPLEMENT: Complement C4, Body Fluid: 34 mg/dL (ref 16–47)

## 2010-06-17 LAB — LEGIONELLA ANTIGEN, URINE: Legionella Antigen, Urine: NEGATIVE

## 2010-06-17 LAB — CARDIAC PANEL(CRET KIN+CKTOT+MB+TROPI)
CK, MB: 1 ng/mL (ref 0.3–4.0)
Relative Index: 0.8 (ref 0.0–2.5)
Total CK: 130 U/L (ref 7–177)

## 2010-06-17 LAB — C3 COMPLEMENT: C3 Complement: 181 mg/dL (ref 88–201)

## 2010-06-17 LAB — RHEUMATOID FACTOR: Rhuematoid fact SerPl-aCnc: 64 IU/mL — ABNORMAL HIGH (ref <=14)

## 2010-06-18 DIAGNOSIS — M069 Rheumatoid arthritis, unspecified: Secondary | ICD-10-CM

## 2010-06-18 LAB — BASIC METABOLIC PANEL
CO2: 26 mEq/L (ref 19–32)
Chloride: 108 mEq/L (ref 96–112)
Creatinine, Ser: 0.78 mg/dL (ref 0.4–1.2)
GFR calc Af Amer: >60 mL/min (ref >60)
Glucose, Bld: 130 mg/dL — ABNORMAL HIGH (ref 70–99)

## 2010-06-18 LAB — CBC
HCT: 29.8 % — ABNORMAL LOW (ref 36.0–46.0)
Hemoglobin: 8.8 g/dL — ABNORMAL LOW (ref 12.0–15.0)
MCH: 24.9 pg — ABNORMAL LOW (ref 26.0–34.0)
MCV: 84.4 fL (ref 78.0–100.0)
RBC: 3.53 MIL/uL — ABNORMAL LOW (ref 3.87–5.11)

## 2010-06-18 LAB — ANTI-RIBONUCLEIC ACID ANTIBODY: Sm/rnp: 1 AU/mL (ref <30)

## 2010-06-18 LAB — SJOGRENS SYNDROME-B EXTRACTABLE NUCLEAR ANTIBODY: SSB (La) (ENA) Antibody, IgG: 71 AU/mL — ABNORMAL HIGH (ref <30)

## 2010-06-18 LAB — CYCLIC CITRUL PEPTIDE ANTIBODY, IGG: Cyclic Citrullin Peptide Ab: 99.9 U/mL — ABNORMAL HIGH (ref 0.0–5.0)

## 2010-06-18 LAB — SJOGRENS SYNDROME-A EXTRACTABLE NUCLEAR ANTIBODY: SSA (Ro) (ENA) Antibody, IgG: 245 AU/mL — ABNORMAL HIGH (ref <30)

## 2010-06-18 NOTE — Progress Notes (Signed)
Summary: pt stated that she was returning a call from leslie  Phone Note Call from Patient Call back at Home Phone [phone] [facility]     Caller: Patient Call For: Yasemin Rabon Action Taken: Patient advised to go to ER Summary of Call: patient phoned stated that she was returning a call to [place]. Patient can be reached at [phone]  Follow-up for Phone Call        pt advised of lab results per append. Carron Curie CMA  June 08, 2010 2:55 PM

## 2010-06-18 NOTE — H&P (Signed)
[name], [name]                 ACCOUNT NO.:  [id number]  MEDICAL RECORD NO.:  [id number]          PATIENT TYPE:  INP  LOCATION:  1443                         FACILITY:  [facility]  PHYSICIAN:  Lonia Blood, M.D.      DATE OF BIRTH:  Mar 16, 1971  DATE OF ADMISSION:  05/06/2010 DATE OF DISCHARGE:                             HISTORY & PHYSICAL   PRIMARY CARE PHYSICIAN:  Carola J. Gerri Spore, M.D.  PRESENTING COMPLAINTS:  Shortness of breath.  HISTORY OF PRESENT ILLNESS:  The patient is a 40 year old female who was recently admitted in December of this year with shortness of breath diagnosed with bilateral pneumonia and pleural as well as pericardial effusion.  She has baseline history of lupus as well as rheumatoid arthritis.  At the time of discharge, the patient was doing fine.  She was discharged on 5 days of oral Avelox which she completed.  After completion, however, the patient continued to have recurrent shortness of breath and worsening symptoms.  She was referred back to Dr. Armanda Magic, cardiologist, with possible recurrent pericardial effusion. Evaluation there showed that she had no new pericardial effusion; rather, she had another organizing pneumonia in the lungs.  The patient is subsequently being admitted for further management.  In the ED, her oxygen saturation dropped into the 80s after being initially discharged home.  PAST MEDICAL HISTORY:  Significant for recent pneumonia which is multifocal with pericardial effusion, history of lupus, history of rheumatoid arthritis, GERD, asthma, recent genital herpes, history of back pain, morbid obesity, chronic back pain, previous EKG changes but no confound cardiac disease.  ALLERGIES:  To PENICILLIN that causes swelling, HYDROCODONE, ACETAMINOPHEN as well as CLARITHROMYCIN.  CURRENT MEDICATIONS:  Her current medications include ibuprofen 600 mg t.i.d.  She is back on Avelox 400 mg daily started today, OxyContin 5 mg 1  tablet every 6 hours p.r.n., prednisone as needed, Robaxin 600 mg q.6 h p.r.n., Tylenol 500 mg 1 to 2 tablets p.r.n., albuterol inhaler 2 puffs q.4 h p.r.n., Nexium 40 mg daily, Plaquenil 200 mg daily, Symbicort 84.5 two puffs twice daily, and Veramyst 2 sprays inhale every 4 hours as needed.  SOCIAL HISTORY:  The patient lives in [place].  She denied any smoking, alcohol or IV drug use.  She is unemployed and lives with her family including her brother her mother.  She moved along with without any problem.  FAMILY HISTORY:  Her family history is significant for diabetes, hypertension and coronary artery disease.  REVIEW OF SYSTEMS:  Just some pain in her back.  Otherwise, all systems reviewed are negative except per HPI.  PHYSICAL EXAMINATION:  VITAL SIGNS:  On exam, temperature was 97.9, initial blood pressure 127/84 with a pulse of 117, respiratory rate 20. Her sats 86% on room air, currently 97% of 2 L. GENERAL:  Generally, she is awake, alert, oriented.  She is in no acute distress.  She is morbidly obese. HEENT: PERRLA.  EOMI.  No pallor, no jaundice.  No rhinorrhea. NECK:  Supple.  No JVD, no lymphadenopathy. RESPIRATORY:  She has slight decrease in air entry at the bases with  some rhonchi and crackles.  No audible wheezing. CARDIOVASCULAR SYSTEM:  She is tachycardic. ABDOMEN:  Obese, soft, nontender with positive bowel sounds. EXTREMITIES:  No edema, cyanosis or clubbing. SKIN EXAM:  No rashes, no ulcers observed. MUSCULOSKELETAL:  No significant joint swellings or tenderness at this point.  LABORATORY DATA:  Her white count is 15900 with left shift, ANC of 11.4, platelet count of 632.  Sodium is 141, potassium is 3.4, chloride 107, CO2 of 22, glucose 116, BUN 9, creatinine 0.70, total bilirubin is 1.4, total protein 9.1 with albumin 3.1, calcium 8.5.  CT angiogram of the chest showed limited evaluation for PE but consolidation noted in the lingula and left lower lobe  with left greater than right pleural effusion suggesting infection.  There is improved right lower lobe airspace disease.  There is cardiomegaly and small pericardial effusion which is unchanged.  ASSESSMENT:  This a 40 year old female with persistent recurrent pneumonia.  More than likely, the patient's pneumonia was not completely cleared.  She probably started with the pneumonia early on, was given some Avelox but it has not completely cleared.  She needs to continue with further treatment.  PLAN: 1. Bilateral pneumonia and pleural effusion.  We will admit the     patient momentarily secondary to her hypoxia.  We have attempted to     treat her as an outpatient, however, her oxygen sat on room air was     in the 80s.  We will therefore admit her, start IV antibiotics once     again.  We will recheck her chest x-ray in the morning.  If her     pleural effusion gets worse, we may have to do thoracentesis;     otherwise, we will keep her on oxygen briefly, treat her     accordingly and see if she requires home oxygen. 2. Morbid obesity.  The patient is counseled and she knows she needs     to lose weight. 3. Rheumatoid arthritis and lupus.  She is currently on Plaquenil.  We     will continue with her medication in the hospital. 4. History of asthma.  I have put her empirically on nebulizers, again     due to her hypoxia. 5. Chronic back pain.  We will continue her home medications as     necessary. 6. Subtle EKG changes.  The patient's EKG shows sinus tachycardia with     T-wave inversions in lateral leads, however, this is not new.  We     will still cycle her enzymes since she has family history of     coronary artery disease. 7. GERD.  Continue with PPI at this hospitalization.  Further     treatment depends on the patient's response to our initial     measures.     Lonia Blood, M.D.     [name]  D:  05/07/2010  T:  05/07/2010  Job:  [id number]  Electronically Signed by  Lonia Blood M.D. on 06/17/2010 04:10:11 PM

## 2010-06-19 DIAGNOSIS — R05 Cough: Secondary | ICD-10-CM

## 2010-06-19 DIAGNOSIS — R059 Cough, unspecified: Secondary | ICD-10-CM

## 2010-06-19 LAB — ANTI-NUCLEAR AB-TITER (ANA TITER): ANA Titer 1: 1:2560 {titer}

## 2010-06-19 LAB — BASIC METABOLIC PANEL
Calcium: 8.8 mg/dL (ref 8.4–10.5)
Chloride: 109 mEq/L (ref 96–112)
Creatinine, Ser: 0.51 mg/dL (ref 0.4–1.2)
GFR calc Af Amer: >60 mL/min (ref >60)
Sodium: 139 mEq/L (ref 135–145)

## 2010-06-19 LAB — CBC
MCH: 24.3 pg — ABNORMAL LOW (ref 26.0–34.0)
MCHC: 29.1 g/dL — ABNORMAL LOW (ref 30.0–36.0)
Platelets: 454 10*3/uL — ABNORMAL HIGH (ref 150–400)
RBC: 3.45 MIL/uL — ABNORMAL LOW (ref 3.87–5.11)

## 2010-06-19 NOTE — Consult Note (Signed)
[name], [name] NO.:  [id number]  MEDICAL RECORD NO.:  [id number]           PATIENT TYPE:  E  LOCATION:  WLED                         FACILITY:  [facility]  PHYSICIAN:  Wendi Snipes, MD DATE OF BIRTH:  10-20-1970  DATE OF CONSULTATION:  06/15/2010 DATE OF DISCHARGE:                                CONSULTATION   CARDIOLOGIST:  Armanda Magic, M.D.  PRIMARY CARE PHYSICIAN:  Carola J. Gerri Spore, M.D.  CHIEF COMPLAINT:  Fever and chest pain.  HISTORY OF PRESENT ILLNESS:  This is a 40 year old African American female with a history of rheumatoid arthritis and lupus with a recent diagnosis of bilateral pneumonia who presents here with a week of fever and worsening pleuritic/positional chest pain.  She states that she had most recently been on antibiotics 2 weeks ago for severe bilateral pneumonia and she was admitted for approximately 3 days of January.  She was diagnosed with a small pleural effusion at that time with a normal ejection fraction.  She was discharged and has been defervescing up until approximately 1 week ago when she noticed return of her fever and the severe burning chest pain.  The chest pain starts in right chest wall and has most recently radiated to her left side.  She is unable to take deep breaths or laugh and additionally she takes ibuprofen at night so that she can fully lay flat as the pain is worse in a supine position.  She otherwise denies increased lower extremity edema, palpitations, syncope or presyncope before.  ALLERGIES:  HYDROCODONE, PENICILLIN, BIAXIN, and ZOFRAN.  PAST MEDICAL HISTORY: 1. Lupus. 2. Rheumatoid arthritis. 3. Asthma. 4. Genital herpes. 5. GERD. 6. Obesity.  MEDICATIONS ON ADMISSION: 1. Acyclovir 200 mg daily. 2. Flonase 50 mcg 2 puffs daily. 3. Veramyst 2.75 twice daily. 4. Hydroxychloroquine 400 mg at night. 5. Nexium 40 mg daily. 6. Albuterol as needed. 7. Symbicort 80/4.5 twice a day. 8.  Phenergan with Codeine. 9. Tessalon Perles. 10.Advil 200 mg as needed.  SOCIAL HISTORY:  She lives in [place] with mother.  She is currently unemployed.  She does not smoke.  FAMILY HISTORY:  Mother had myocardial function in her 47s.  REVIEW OF SYSTEMS:  All 14 systems were reviewed and were negative except as mentioned in detail in HPI.  PHYSICAL EXAMINATION:  VITAL SIGNS:  Blood pressure is 140/97, respiratory rate is 20.  Pulse is 135.  She is satting 90% on 2 L nasal cannula.  Her T-max in the emergency department was 102.6 degrees Fahrenheit. GENERAL:  She is a 40 year old obese African American female appearing stated age in mild acute distress. HEENT:  Moist mucous membranes.  Pupils equal, round, react to light accommodation.  Anicteric sclera. NECK:  A 10-12 cm jugular venous pulsations without Kussmaul sign. CARDIOVASCULAR:  Tachycardic without murmur. LUNGS:  Dull to auscultation at bases, otherwise clear at the apices. ABDOMEN:  Nontender, nondistended.  Positive bowel sounds.  No masses. EXTREMITIES:  Trace lower extremity edema, 2+ pulses throughout. NEUROLOGIC:  Alert and oriented x3.  Cranial nerves II-XII grossly intact.  No focal neurologic deficits. SKIN:  Warm, dry, intact.  No rashes. PSYCH:  Mood and affect are appropriate.  RADIOLOGY:  CT PA showed suspicion for acute bilateral pneumonia with pleural and pericardial effusions.  EKG showed sinus tachycardia with a rate of 103 beats per minute with nonspecific T-wave abnormalities and no PR deviation with previous study showing T-wave inversions in inferolateral leads with faster rate.  LABORATORY REVIEW:  White cell count is 11, hematocrit is 33, potassium is 4.2, creatinine is less than 0.3.  INR is 1.1.  Her blood gas showed a pH of 7.44, CO2 of 39, O2 of 77 on 2 L nasal cannula.  ASSESSMENT AND PLAN:  This is a 40 year old African American female with fever, pleuritic chest pain, here found to  have bilateral pneumonia and pericardial and pleural effusions. 1. Pericardial effusion.  Her exam does not suggest tamponade     physiology at this time.  This is likely a chronic pericardial     effusion and very likely has progressed since her previous study in     January.  Her pericardial process is likely rheumatoid in etiology     and may require trial of systemic steroids.  Continue her current     treatment of bilateral pneumonia and supportive care with serial     echocardiograms to monitor effusion.  We will check an     echocardiogram in the morning. 2. Sinus tachycardia.  This is likely multifactorial in context of an     effusion and thoracic inflammations.  She may indeed develop a     tachycardia induced cardiomyopathy and her sinus tachycardia should     be addressed by treating her underlying inflammatory process at     this time.  We will watch her closely.     Wendi Snipes, MD     BHH/MEDQ  D:  06/15/2010  T:  06/15/2010  Job:  [id number]  Electronically Signed by Jim Desanctis MD on 06/19/2010 12:57:56 PM

## 2010-06-20 DIAGNOSIS — I5021 Acute systolic (congestive) heart failure: Secondary | ICD-10-CM

## 2010-06-20 LAB — BASIC METABOLIC PANEL
CO2: 24 mEq/L (ref 19–32)
Calcium: 9.3 mg/dL (ref 8.4–10.5)
Creatinine, Ser: 0.65 mg/dL (ref 0.4–1.2)
Glucose, Bld: 178 mg/dL — ABNORMAL HIGH (ref 70–99)

## 2010-06-21 LAB — CULTURE, BLOOD (ROUTINE X 2)
Culture  Setup Time: 201203052310
Culture: NO GROWTH

## 2010-06-21 LAB — BASIC METABOLIC PANEL
BUN: 16 mg/dL (ref 6–23)
CO2: 26 mEq/L (ref 19–32)
Chloride: 103 mEq/L (ref 96–112)
Glucose, Bld: 275 mg/dL — ABNORMAL HIGH (ref 70–99)
Potassium: 4.4 mEq/L (ref 3.5–5.1)

## 2010-06-22 LAB — BLOOD GAS, ARTERIAL
Acid-base deficit: 1 mmol/L (ref 0.0–2.0)
Acid-base deficit: 1.3 mmol/L (ref 0.0–2.0)
Bicarbonate: 19.9 mEq/L — ABNORMAL LOW (ref 20.0–24.0)
Bicarbonate: 22.7 mEq/L (ref 20.0–24.0)
Delivery systems: POSITIVE
Expiratory PAP: 4
FIO2: 0.4 %
FIO2: 0.4 %
FIO2: 0.4 %
Inspiratory PAP: 8
O2 Saturation: 98.5 %
O2 Saturation: 98.5 %
O2 Saturation: 98.5 %
Patient temperature: 98.6
Patient temperature: 99.4
TCO2: 18.8 mmol/L (ref 0–100)
TCO2: 19.8 mmol/L (ref 0–100)
pCO2 arterial: 29.4 mmHg — ABNORMAL LOW (ref 35.0–45.0)
pO2, Arterial: 115 mmHg — ABNORMAL HIGH (ref 80.0–100.0)

## 2010-06-22 LAB — COMPREHENSIVE METABOLIC PANEL
ALT: 17 U/L (ref 0–35)
AST: 17 U/L (ref 0–37)
CO2: 23 mEq/L (ref 19–32)
Chloride: 110 mEq/L (ref 96–112)
GFR calc Af Amer: >60 mL/min (ref >60)
GFR calc non Af Amer: >60 mL/min (ref >60)
Sodium: 140 mEq/L (ref 135–145)
Total Bilirubin: 0.4 mg/dL (ref 0.3–1.2)

## 2010-06-22 LAB — BASIC METABOLIC PANEL
BUN: 23 mg/dL (ref 6–23)
BUN: 23 mg/dL (ref 6–23)
BUN: 7 mg/dL (ref 6–23)
CO2: 27 mEq/L (ref 19–32)
CO2: 23 mEq/L (ref 19–32)
CO2: 26 mEq/L (ref 19–32)
Chloride: 99 mEq/L (ref 96–112)
Chloride: 107 mEq/L (ref 96–112)
Chloride: 107 mEq/L (ref 96–112)
Creatinine, Ser: 0.72 mg/dL (ref 0.4–1.2)
GFR calc Af Amer: >60 mL/min (ref >60)
GFR calc Af Amer: >60 mL/min (ref >60)
GFR calc non Af Amer: >60 mL/min (ref >60)
Glucose, Bld: 125 mg/dL — ABNORMAL HIGH (ref 70–99)
Glucose, Bld: 179 mg/dL — ABNORMAL HIGH (ref 70–99)
Potassium: 2.9 mEq/L — ABNORMAL LOW (ref 3.5–5.1)
Potassium: 3.6 mEq/L (ref 3.5–5.1)
Potassium: 4.2 mEq/L (ref 3.5–5.1)
Sodium: 138 mEq/L (ref 135–145)
Sodium: 139 mEq/L (ref 135–145)
Sodium: 142 mEq/L (ref 135–145)

## 2010-06-22 LAB — TSH: TSH: 1.488 u[IU]/mL (ref 0.350–4.500)

## 2010-06-22 LAB — URINALYSIS, ROUTINE W REFLEX MICROSCOPIC
Glucose, UA: NEGATIVE mg/dL
Hgb urine dipstick: NEGATIVE
Leukocytes, UA: NEGATIVE
Protein, ur: 30 mg/dL — AB
Specific Gravity, Urine: 1.026 (ref 1.005–1.030)
pH: 7 (ref 5.0–8.0)

## 2010-06-22 LAB — CULTURE, BLOOD (ROUTINE X 2)
Culture  Setup Time: 201112232308
Culture: NO GROWTH

## 2010-06-22 LAB — POCT CARDIAC MARKERS
CKMB, poc: <1 ng/mL — ABNORMAL LOW (ref 1.0–8.0)
Myoglobin, poc: 92.7 ng/mL (ref 12–200)

## 2010-06-22 LAB — URINE MICROSCOPIC-ADD ON

## 2010-06-22 LAB — DIFFERENTIAL
Basophils Relative: 0 % (ref 0–1)
Lymphs Abs: 2.1 10*3/uL (ref 0.7–4.0)
Monocytes Absolute: 1.3 10*3/uL — ABNORMAL HIGH (ref 0.1–1.0)
Monocytes Relative: 9 % (ref 3–12)
Neutro Abs: 11.7 10*3/uL — ABNORMAL HIGH (ref 1.7–7.7)
Neutrophils Relative %: 77 % (ref 43–77)

## 2010-06-22 LAB — CARDIAC PANEL(CRET KIN+CKTOT+MB+TROPI)
CK, MB: 0.7 ng/mL (ref 0.3–4.0)
Relative Index: INVALID (ref 0.0–2.5)
Total CK: 68 U/L (ref 7–177)
Total CK: 69 U/L (ref 7–177)

## 2010-06-22 LAB — GLUCOSE, CAPILLARY
Glucose-Capillary: 168 mg/dL — ABNORMAL HIGH (ref 70–99)
Glucose-Capillary: 201 mg/dL — ABNORMAL HIGH (ref 70–99)
Glucose-Capillary: 206 mg/dL — ABNORMAL HIGH (ref 70–99)
Glucose-Capillary: 234 mg/dL — ABNORMAL HIGH (ref 70–99)
Glucose-Capillary: 319 mg/dL — ABNORMAL HIGH (ref 70–99)
Glucose-Capillary: 337 mg/dL — ABNORMAL HIGH (ref 70–99)
Glucose-Capillary: 367 mg/dL — ABNORMAL HIGH (ref 70–99)

## 2010-06-22 LAB — CBC
HCT: 32.4 % — ABNORMAL LOW (ref 36.0–46.0)
Hemoglobin: 10.1 g/dL — ABNORMAL LOW (ref 12.0–15.0)
Hemoglobin: 8.2 g/dL — ABNORMAL LOW (ref 12.0–15.0)
MCH: 25.4 pg — ABNORMAL LOW (ref 26.0–34.0)
MCHC: 30.8 g/dL (ref 30.0–36.0)
MCHC: 31.2 g/dL (ref 30.0–36.0)
Platelets: 378 10*3/uL (ref 150–400)
RBC: 3.21 MIL/uL — ABNORMAL LOW (ref 3.87–5.11)
RBC: 3.46 MIL/uL — ABNORMAL LOW (ref 3.87–5.11)
RBC: 3.9 MIL/uL (ref 3.87–5.11)
WBC: 16.9 10*3/uL — ABNORMAL HIGH (ref 4.0–10.5)

## 2010-06-22 LAB — MAGNESIUM: Magnesium: 1.5 mg/dL (ref 1.5–2.5)

## 2010-06-22 LAB — SEDIMENTATION RATE: Sed Rate: 80 mm/hr — ABNORMAL HIGH (ref 0–22)

## 2010-06-22 LAB — TROPONIN I: Troponin I: 0.02 ng/mL (ref 0.00–0.06)

## 2010-06-22 LAB — PROCALCITONIN: Procalcitonin: 0.18 ng/mL

## 2010-06-22 LAB — LACTIC ACID, PLASMA: Lactic Acid, Venous: 2 mmol/L (ref 0.5–2.2)

## 2010-06-22 LAB — MRSA PCR SCREENING: MRSA by PCR: NEGATIVE

## 2010-06-22 LAB — VANCOMYCIN, TROUGH: Vancomycin Tr: 18.4 ug/mL (ref 10.0–20.0)

## 2010-06-22 LAB — CK TOTAL AND CKMB (NOT AT ARMC): Relative Index: INVALID (ref 0.0–2.5)

## 2010-06-22 LAB — POTASSIUM: Potassium: 3.9 mEq/L (ref 3.5–5.1)

## 2010-06-24 LAB — BASIC METABOLIC PANEL
CO2: 28 mEq/L (ref 19–32)
Chloride: 105 mEq/L (ref 96–112)
GFR calc Af Amer: >60 mL/min (ref >60)
Potassium: 3.2 mEq/L — ABNORMAL LOW (ref 3.5–5.1)
Sodium: 139 mEq/L (ref 135–145)

## 2010-06-24 LAB — CBC
MCH: 28 pg (ref 26.0–34.0)
Platelets: 499 10*3/uL — ABNORMAL HIGH (ref 150–400)
RBC: 4.02 MIL/uL (ref 3.87–5.11)
WBC: 14.2 10*3/uL — ABNORMAL HIGH (ref 4.0–10.5)

## 2010-06-24 LAB — DIFFERENTIAL
Eosinophils Absolute: 0.3 10*3/uL (ref 0.0–0.7)
Lymphs Abs: 4.7 10*3/uL — ABNORMAL HIGH (ref 0.7–4.0)
Neutro Abs: 8.4 10*3/uL — ABNORMAL HIGH (ref 1.7–7.7)
Neutrophils Relative %: 59 % (ref 43–77)

## 2010-06-25 NOTE — H&P (Signed)
NAMEBRONWEN, [name] NO.:  [id number]  MEDICAL RECORD NO.:  [id number]           PATIENT TYPE:  E  LOCATION:  WLED                         FACILITY:  [facility]  PHYSICIAN:  Marilyn Harvest, MD    DATE OF BIRTH:  01/02/1971  DATE OF ADMISSION:  06/15/2010 DATE OF DISCHARGE:                             HISTORY & PHYSICAL   PRIMARY CARE PHYSICIAN:  [name], M.D., of [facility]  CARDIOLOGIST:  [name], M.D., of [facility].  CHIEF COMPLAINT:  Hypoxia/shortness of breath.  HISTORY OF PRESENT ILLNESS:  [name] is a pleasant 40 year old African American female with a history of asthma, lupus, rheumatoid arthritis on Plaquenil, history of pericardial effusion per E-Chart, recent hospitalization twice for multifocal pneumonia since December 2011, who presents to ED from PCP's office with hypoxia.  The patient stated that she did have a 3-day history of worsening shortness of breath on exertion, will get short of breath on walking to the mailbox. She also did state that she had a temperature of 101.7 at home with a productive cough of yellowish sputum, nausea and some burning in the midchest, constipation, right-sided dull pain which has moved now to the left side and is a dull pain.  The patient went to see her PCP.  She was found to be hypoxic with sats in the high 80s, with increased shortness of breath and she presented to the ED.  In the ED, CT chest which was done was negative for PE and was positive for bilateral pneumonia with a pericardial effusion.  We were called to admit the patient.  The patient on the interview does have a packet from her PCP's office that shows a recent 2-D echo done on June 01, 2010, with an EF of 32.2% and the patient states that she is supposed to be scheduled for a cardiac catheterization this Friday.  The patient denies any headache.  No diarrhea.  Does endorse some constipation.  No focal  neurological symptoms.  No dysuria.  No other associated symptoms.  ALLERGIES: 1. HYDROCODONE/APAP causes nausea. 2. PENICILLIN causes swelling. 3. BIAXIN causes an upset stomach. 4. ONDANSETRON causes itching.  SOCIAL HISTORY:  No tobacco use.  No alcohol use.  No IV drug use.  The patient is unemployed and on [employer].  PAST MEDICAL HISTORY: 1. Asthma. 2. Rheumatoid arthritis. 3. Lupus. 4. History of genital herpes. 5. Gastroesophageal reflux disease. 6. Obesity. 7. Chronic back pain. 8. History of bilateral recurrent pneumonia in December 2011 and     January 2012.  FAMILY HISTORY:  Mother alive at age 71 with a history of coronary artery disease, diabetes, and breast cancer.  Father deceased at age 81 with blood clots in the brain per the patient.  HOME MEDICATIONS: 1. Acyclovir 200 mg p.o. daily. 2. Flonase 50 mcg 2 sprays once daily. 3. Veramyst 27.5 mcg per spray 2 puffs each nostril once a day. 4. Hydroxychloroquine sulfate 200 mg 2 tablets at bedtime. 5. Mucinex 600 mg p.o. b.i.d. 6. Nexium 40 mg p.o. daily p.r.n. 7. Albuterol inhaler 2 puffs q.3-4 h. p.r.n. 8.  Symbicort 80/4.5 two puffs twice daily as needed. 9. Promethazine and codeine 6.25/10 mg per 5 cc syrup 5 cc p.r.n. q.6     h. 10.Tessalon Perles 100 mg p.o. q.8 h. p.r.n. 11.Tramadol 50 mg p.o. q.6 h. p.r.n. 12.Advil 200 mg p.o. daily p.r.n.  REVIEW OF SYSTEMS:  As per HPI, otherwise negative.  PHYSICAL EXAMINATION:  VITAL SIGNS:  Temperature 98.8, blood pressure 138/53, heart rate ranged from 125-134, respiratory rate 20, satting 93% on room air. GENERAL:  The patient is a well-developed, well-nourished female with some shortness of breath on talking, however, in no overt cardiopulmonary distress. HEENT:  Normocephalic, atraumatic.  Pupils equal, round, reactive to light and accommodation.  Extraocular movements intact.  Oropharynx is clear.  No lesions, no exudates. NECK:  Supple.  No  lymphadenopathy. RESPIRATORY:  Coarse diffuse crackly breath sounds, decreased breath sounds in the bases. CARDIOVASCULAR:  Tachycardic, regular rhythm. ABDOMEN:  Soft, obese, nontender, nondistended.  Positive bowel sounds. EXTREMITIES:  No clubbing, cyanosis, or edema. NEUROLOGIC:  The patient is alert and oriented x3.  Cranial nerves II through XII are grossly intact with no focal deficits.  ADMISSION LABORATORY DATA:  BMET:  Sodium 136, potassium 4.2, chloride 99, bicarb 28, glucose 123, BUN 9, creatinine less than 0.3, calcium of 8.7.  PTT of 34, PT of 14.5, INR 1.11.  CBC:  White count 11, hemoglobin 10.3, hematocrit 33.8, platelet count of 573 with an ANC of 6.8.  CT angiogram of the chest study is limited technically by large body habitus and breathing motion, no suspicion of PE, bilateral patchy airspace infiltrates suspicious for acute pneumonia, bilateral pleural effusions and pericardial effusion.  EKG shows a sinus tachycardia, LVH with repolarization abnormalities.  ASSESSMENT AND PLAN:  [name] is a 40 year old female with a history of lupus, history of rheumatoid arthritis, recurrent hospitalizations for pneumonia x3 since December presenting with hypoxia from PCP's office.  CT of the chest with bilateral pneumonia and bilateral pleural effusions and pericardial effusion. 1. Recurrent bilateral pneumonia.  This is the patient's 3rd episode     since December 2011, this is per CT.  We will admit the patient to     step-down unit as the patient is tachypneic and tachycardic.  We     will check a sputum Gram stain and culture.  Check blood cultures     x2.  Check a lactic acid level.  Check a procalcitonin.  Place on     oxygen, Tessalon Perles, IV vancomycin and Levaquin, nebulizer     treatment.  We will consult with Pulmonary for further evaluation     and recommendations.  Continue home dose Symbicort. 2. Tachycardia, questionable etiology, may be secondary to  recurrent     bilateral pneumonia versus secondary to pericardial effusion and     pleural effusions versus cardiac in nature versus dehydration     versus secondary to hypoxia.  CT of the chest is negative for     pulmonary embolism.  The patient does have a recurrent 2-D echo     June 01, 2010, with an EF of 32%.  We will check a BNP.  We     will cycle cardiac enzymes q.8 h. x3.  Check a 2-D echo.  Saline     lock IV fluids.  We will give 1 dose of Lasix IV x1 and we will     consult with Cardiology for further evaluation and management. 3. Pericardial effusion, may be likely secondary to  lupus; however,     this on CT scan seems a little bigger than before.  We will check a     TSH.  We will check a 2-D echo to follow up on pericardial     effusion, to follow up on hemodynamics.  The patient's blood     pressure is currently stable.  We will need to monitor this.  We     will consult with Cardiology for further evaluation and     recommendations. 4. Dyspnea/hypoxia, likely multifactorial in nature secondary to     recurrent pneumonia and may be secondary to pericardial effusion     and a cardiac etiology.  The patient did have a recent 2-D echo     done that did show an EF of 32.2%.  We will check an ABG stat,     check a TSH, check a BNP.  We will repeat a 2-D echo.  We will give     Lasix 40 mg IV x1.  We will check a BNP.  We will saline lock     fluids.  We will consult with Cardiology and Pulmonary for further     evaluation and recommendation.  We will place on empiric IV     vancomycin and Levaquin. 5. Lupus/rheumatoid arthritis.  We will hold the patient's Plaquenil     for now. 6. Asthma.  Nebs as needed. 7. History of herpes.  Continue home dose acyclovir. 8. Prophylaxis.  Protonix for gastrointestinal prophylaxis.  Lovenox     for deep venous thrombosis prophylaxis.  It has been a pleasure taking care of Ms. Casimer Leek.     Marilyn Harvest, MD     DT/MEDQ   D:  06/15/2010  T:  06/15/2010  Job:  [id number]  cc:   Otilio Connors. Gerri [name], M.D. Fax: [phone]  [name], M.D. Fax: [phone]  Electronically Signed by Marilyn Harvest MD on 06/25/2010 08:27:04 PM

## 2010-06-27 ENCOUNTER — Inpatient Hospital Stay (INDEPENDENT_AMBULATORY_CARE_PROVIDER_SITE_OTHER)
Admission: RE | Admit: 2010-06-27 | Discharge: 2010-06-27 | Disposition: A | Discharge status: Home / Self Care | Payer: Medicare PPO | Source: Ambulatory Visit | Attending: Family Medicine | Admitting: Family Medicine

## 2010-06-27 DIAGNOSIS — K219 Gastro-esophageal reflux disease without esophagitis: Secondary | ICD-10-CM

## 2010-06-30 NOTE — Letter (Signed)
Summary: Carolin Coy MD/[facility]  Carolin Coy MD/[facility]   Imported By: Lester Canterwood 06/23/2010 08:05:57  _____________________________________________________________________  External Attachment:    Type:   Image     Comment:   External Document

## 2010-07-09 NOTE — Discharge Summary (Signed)
[name], [name]                 ACCOUNT NO.:  [id number]  MEDICAL RECORD NO.:  [id number]           PATIENT TYPE:  I  LOCATION:  1421                         [facility]AC[facility]  [facility]  PHYSICIAN:  Kela Millin, M.D.DATE OF BIRTH:  November 27, 1970  DATE OF ADMISSION:  06/15/2010 DATE OF DISCHARGE:  06/22/2010                        DISCHARGE SUMMARY - REFERRING   DISCHARGE DIAGNOSES: 1. 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 2. Pericardial effusion secondary to rheumatoid arthritis, per     rheumatology. 3. Acute on chronic systolic heart failure. 4. Diarrhea, Clostridium difficile negative, resolved. 5. Hypokalemia, resolved. 6. Systemic lupus. 7. Probable Sjogren's. 8. History of asthma. 9. History of genital herpes. 10.Gastroesophageal reflux disease. 11.Obesity. 12.History of chronic back pain. 13.History of ?bilateral recurrent pneumonia in December 2011 and     January 2012.  PROCEDURES AND STUDIES: 1. CT angiogram of the chest on 03/05:  The study is limited     technically by a large body habitus and breathing motion.  No     suspicion of pulmonary emboli.  Bilateral patchy air space     infiltrates suspicious for acute pneumonia.  Bilateral pleural     effusions and pericardial effusion. 2. 2-D echocardiogram on June 16, 2010:  There is mild focal basal     hypertrophy of the septum.  Systolic function moderately to     severely reduced.  The estimated ejection fraction was in the range     of 30% to35%.  Diffuse hypokinesis.  Doppler parameters consistent     with abnormal left ventricular relaxation, grade 1 diastolic     dysfunction.  A small pericardial effusion was identified.  When     compared to a prior study of 03/2010, ejection fraction has     decreased.  Tachycardia remains persistent.  Diffuse left     ventricular hypokinesis.  CONSULTATIONS: 1. Pulmonary:  Dr. Corinda Gubler, Critical care. 2. Cardiology: Dr. French Ana  Turner/Dr. Garnette Scheuermann. 3. Rheumatology:  Dr. Stacey Drain. 4.  BRIEF HISTORY:  The patient is a pleasant 40 year old black female with the above-listed medical problems who presented with complaints of shortness of breath and was found to be hypoxic at her primary care physician's office.  She reported that she had had a 3-day history of worsening shortness of breath on exertion and was getting short of breath with walking to her mailbox.  She also had a temperature to 101.7 and a cough productive of yellowish sputum.  She also admitted to burning pain in her mid chest and right-sided chest pain which was dull and stated that it had moved to the left side.  From her PCP's office she was sent to the ED and her saturations were in the high 80s in the ED and a CT scan of her chest was done.  It was negative for PE but did show bilateral infiltrates.  She was admitted for further evaluation and management.  HOSPITAL COURSE: 1. Rheumatoid lung disease with bilateral lung infiltrates and a     pleural effusion:  Upon admission, the blood and sputum cultures  were obtained and the patient was started on empiric antibiotics.     A CT scan of her chest was done and the results are as stated     above.  Pulmonology was consulted for further recommendations.  She     was also maintained on bronchodilators during this hospital stay.     Pulmonology saw the patient and indicated that her CT scan was     unchanged compared to the one of October 2011, December 2011 and     this was despite treatment on these occasions already for     infectious etiologies.  Dr. Marchelle Gearing indicated that his suspicion     was that this was due to collagen vascular disease given the     history of failed antibiotic treatment and relatively normal PCP.     Workup for collagen vascular disease included rheumatoid factor,     which came back elevated at 64, her C3 was 181 with C4 complement     of 34, cyclic  citrullinated peptide antibody - IgG was high at     99.9.  Anti-DNA 10, scleroderma SCL of 70, IgG antibody was 1, SSA     245 SSB 71, RNP 1, ANA positive, anti ANA antibody 1:2560, CH50     greater than 60.  These findings were consistent with active     rheumatoid disease and probable Sjogren's as well.  The patient     continued to be febrile even on the broad-spectrum antibiotics and     her blood cultures came back with no growth.  Following this, Dr.     Kellie Simmering was consulted and he saw the patient and his impression was     that these lung findings, including the pleural effusion as well as     her pericardial effusion, was secondary to rheumatoid arthritis.     He started the patient on IV Solu-Medrol 80 mg IV q.8 h for 2 days     and then changed that to q.12 h.  With this, the patient's symptoms     improved significantly and she defervesced and has remained     afebrile.  I discussed the patient with Dr. Marchelle Gearing and he agreed     to have the antibiotics discontinued and this was done.  The     patient has continued to improve and Dr. Kellie Simmering followed up with     her today, and given her improvement, he changed her to oral  steroids, and from his standpoint, recommended that she could be     discharged.  A component of the patient's lung infiltrates was     likely secondary to CHF/edema as well and cardiology had been     consulted, who followed and diuresed the patient and she is much     improved and her peripheral edema is resolved at this time.  Her IV     Lasix was also changed to p.o. Lasix per cardiology today.  She     will be discharged on oral steroids at this time and she is to     taper the prednisone down as directed to 20 mg and stay on that for     a maintenance dose and follow up with Dr. Kellie Simmering as directed. 2. Small pericardial effusion:  As discussed above, noted on CT scan     as well as 2-D echocardiogram.  Per rheumatology and cardiology,     this is  secondary to her  rheumatoid arthritis, which was treated as     discussed above. 3. Acute on chronic systolic heart failure:  A 2-D echocardiogram was     done following her admission and her cardiac enzymes were cycled     and came back negative.  The 2-D echocardiogram came back with     ejection fraction decreased to 30% to 35% from 50% to 60% in     December 2011.  The patient was placed on beta blockers as well as     ACE inhibitor, and because she was tachycardic as well, her beta-     blockers dose was adjusted as appropriate per cardiology.  She was     diuresed with IV fluids and her shortness of breath and peripheral     edema have resolved at this time; her last brain natriuretic     peptide today prior to discharge is 131.  It was noted that Dr.     Mayford Knife had been planning on doing a cardiac cath on the patient     even prior to this admission, and Dr. Katrinka Blazing followed up with the     patient today and indicated that he doubts coronary artery disease,     and following discussion with Dr. Mayford Knife, indicated that no cardiac     cath was planned during this hospitalization and that the patient     is to follow up with Dr. Mayford Knife in 5 to 7 days, and is to have a     BMET done as an outpatient upon followup.  She had been on an ACE     during this hospital stay but because of her cough which had been     persisting, Pulmonology recommended to change the ACE to an ARB and     so she has been discharged on an ARB as well as the metoprolol and     Lasix. 4. Lupus:  She was maintained on Plaquenil during this hospital stay. 5. Rheumatoid arthritis:  As discussed above, the patient has been     discharged on prednisone which she is to taper down to 20 mg and     stay on that dose, and follow up with Dr. Kellie Simmering. 6. Her other chronic medical problems remained stable during this     hospital stay and she was maintained on her outpatient medications     except as indicated  above.  DISCHARGE MEDICATIONS: 1. Lasix 40 mg p.o. b.i.d. 2. Mucinex DM 2 tablets p.o. b.i.d. 3. Losartan 50 mg p.o. daily. 4. Metoprolol 50 mg p.o. b.i.d. 5. Oxycodone 5 mg q.4 h p.r.n. 6. Potassium chloride 20 mEq p.o. daily. 7. Prednisone, taper as directed and stay on a maintenance dose of 20     mg daily and follow up with Dr. Kellie Simmering. 8. Nexium 40 mg p.o. b.i.d. 9. Acyclovir 200 mg p.o. q. p.m. as previously. 10.Advil migraine 1 tablet daily p.r.n. as previously. 11.Albuterol inhaler 2 puffs q.4 h p.r.n. 12.Alka-Seltzer Plus Cold and Cough p.r.n. as previously. 13.Flonase nasal spray, 2 sprays daily p.r.n. 14.Plaquenil 200 mg 2 tablets at bedtime. 15.Promethazine/codeine 5 mL q.6 h p.r.n. 16.Robaxin one tablet q.6 h p.r.n. 17.Symbicort 2 puffs b.i.d. as previously. 18.Tessalon Perles 100 mg q.8 h p.r.n. 19.Tramadol 50 mg q.6 h p.r.n.  FOLLOWUP CARE: 1. Dr. Carolin Coy in 1 to 2 weeks.  The patient to call for     appointment. 2. Dr. Armanda Magic on July 02, 2010, at 3:30 p.m.,  the patient is to     have a BMET on followup. 3. Dr. Kellie Simmering in 7 to 10 days.  The patient is to call for an     appointment.  DISCHARGE CONDITION:  Improved/stable.     Kela Millin, M.D.     ACV/MEDQ  D:  06/22/2010  T:  06/22/2010  Job:  [id number]  cc:   Aundra Dubin, M.D. [address] [place] [address]  Otilio Connors. Gerri Spore, M.D. Fax: [phone]  Armanda Magic, M.D. Fax: [phone]  Electronically Signed by Donnalee Curry M.D. on 07/09/2010 10:37:00 AM

## 2010-07-13 ENCOUNTER — Inpatient Hospital Stay (HOSPITAL_BASED_OUTPATIENT_CLINIC_OR_DEPARTMENT_OTHER)
Admission: RE | Admit: 2010-07-13 | Discharge: 2010-07-13 | Disposition: A | Discharge status: Home / Self Care | Payer: Medicare PPO | Source: Ambulatory Visit | Attending: Cardiology | Admitting: Cardiology

## 2010-07-13 DIAGNOSIS — R9439 Abnormal result of other cardiovascular function study: Secondary | ICD-10-CM | POA: Insufficient documentation

## 2010-07-13 DIAGNOSIS — I428 Other cardiomyopathies: Secondary | ICD-10-CM | POA: Insufficient documentation

## 2010-07-13 DIAGNOSIS — R0789 Other chest pain: Secondary | ICD-10-CM | POA: Insufficient documentation

## 2010-07-15 NOTE — Procedures (Signed)
[name], [name]                 ACCOUNT NO.:  [id number]  MEDICAL RECORD NO.:  [id number]           PATIENT TYPE:  A  LOCATION:  URG                          FACILITY:  [facility]  PHYSICIAN:  Armanda Magic, M.D.     DATE OF BIRTH:  May 31, 1970  DATE OF PROCEDURE:  07/13/2010 DATE OF DISCHARGE:  06/27/2010                           CARDIAC CATHETERIZATION   REFERRING PHYSICIAN:  Carola J. Gerri Spore, MD.  PROCEDURES:  Left heart catheterization, coronary angiography, left ventriculography.  OPERATOR:  Armanda Magic, MD  INDICATIONS:  Dilated cardiomyopathy, chest pain, abnormal nuclear stress test.  COMPLICATIONS:  None.  IV ACCESS:  Via right femoral artery 4-French sheath.  IV MEDICATIONS:  Versed 2 mg, fentanyl 25 mcg.  This is a 40 year old black female with a history of recurrent chest pain, dilated cardiomyopathy by echo several months ago, recurrent congestive heart failure in the setting of pneumonia and lupus who had an abnormal nuclear stress test and now presents for cardiac catheterization.  The patient was brought to cardiac catheterization laboratory in a fasting nonsedated state.  Informed consent was obtained.  The patient was connected to continuous heart rate and pulse oximetry monitoring and intermittent blood pressure monitoring.  The right groin was prepped and draped in sterile fashion.  A 1% Xylocaine was used for local anesthesia.  Using modified Seldinger technique, a 4-French sheath was placed in right femoral artery.  Under fluoroscopic guidance, a 4-French JL-4 catheter was placed in a cine left coronary ostium but could not engage the ostium.  The catheter was exchanged out over a guidewire for a 4-French JL 3.5 catheter was successfully engaged to the coronary ostium.  Multiple cine films were taken at 30 degree RAO and 40 degree LAO views.  This catheter was exchanged out over a guidewire for a 4- [place] 3DRCA catheter which successfully  engaged to the right coronary ostium.  Multiple cine films were taken at 30 degree RAO, 40 degree LAO views.  This catheter was exchanged out over a guidewire for a 4-French angled pigtail catheter.  This was placed in fluoroscopic guidance in left ventricular cavity.  Left ventriculography was performed in 30 degrees RAO view using total of 25 mL of contrast, 12 mL per second. The catheter was then pulled back across the aortic valve with no significant gradient noted.  At the end of procedure, all catheters and sheaths were removed.  Manual compression was performed, so adequate hemostasis was obtained.  The patient was transferred back to room in stable condition.  RESULTS:  Left main coronary artery was widely patent and bifurcates in left anterior descending artery and left circumflex artery both of which were widely patent.  The left anterior descending artery was widely patent throughout its course giving rise to a first diagonal branch which is moderate to size and widely patent and the ongoing LAD traverses the apex is widely patent.  It gives rise to a second small diagonal branch which was widely patent.  The left circumflex was widely patent throughout its course in the AV groove.  It gives rise to a first obtuse marginal branch  which is small and patent and a second moderate-sized obtuse marginal branch which was patent.  The distal circumflex was widely patent.  The second obtuse marginal branch actually trifurcates into several branches all of which were widely patent.  Right coronary was widely patent throughout its course, distally bifurcates into posterior descending artery and posterolateral artery both of which were widely patent.  Left ventriculography shows low normal LV function, EF 50-55%, LVEDP 10 mmHg, LV pressure 110/6 mmHg, aortic pressure 111/68 mmHg.  ASSESSMENT: 1. Normal coronary arteries. 2. Noncardiac chest pain. 3. Low normal left ventricular  function. 4. History of lupus. 5. 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  PLAN:  We will discharge to home after IV fluid and bedrest are complete.  Given low LVEDP, we will decrease Lasix to 40 mg 1/2 tablet daily.  She will follow up with my nurse practitioner on July 20, 2010 at 2:00 p.m. and we will also get labs done same day with a basic metabolic panel at 1:30 p.m.     Armanda Magic, M.D.     TT/MEDQ  D:  07/13/2010  T:  07/14/2010  Job:  [id number]  cc:   Otilio Connors. Gerri Spore, M.D.  Electronically Signed by Armanda Magic M.D. on 07/15/2010 11:26:09 AM

## 2010-12-09 ENCOUNTER — Other Ambulatory Visit: Payer: Self-pay | Admitting: Family Medicine

## 2010-12-09 ENCOUNTER — Ambulatory Visit
Admission: RE | Admit: 2010-12-09 | Discharge: 2010-12-09 | Disposition: A | Discharge status: Home / Self Care | Payer: Medicare PPO | Source: Ambulatory Visit | Attending: Family Medicine | Admitting: Family Medicine

## 2010-12-09 DIAGNOSIS — R0602 Shortness of breath: Secondary | ICD-10-CM

## 2010-12-09 DIAGNOSIS — R0989 Other specified symptoms and signs involving the circulatory and respiratory systems: Secondary | ICD-10-CM

## 2010-12-09 DIAGNOSIS — R059 Cough, unspecified: Secondary | ICD-10-CM

## 2010-12-09 DIAGNOSIS — R05 Cough: Secondary | ICD-10-CM

## 2011-04-20 ENCOUNTER — Other Ambulatory Visit (HOSPITAL_COMMUNITY)
Admission: RE | Admit: 2011-04-20 | Discharge: 2011-04-20 | Disposition: A | Discharge status: Home / Self Care | Payer: Medicare Other | Source: Ambulatory Visit | Attending: Family Medicine | Admitting: Family Medicine

## 2011-04-20 ENCOUNTER — Other Ambulatory Visit: Payer: Self-pay | Admitting: Family Medicine

## 2011-04-20 ENCOUNTER — Ambulatory Visit
Admission: RE | Admit: 2011-04-20 | Discharge: 2011-04-20 | Disposition: A | Discharge status: Home / Self Care | Payer: Medicare Other | Source: Ambulatory Visit | Attending: Family Medicine | Admitting: Family Medicine

## 2011-04-20 DIAGNOSIS — R05 Cough: Secondary | ICD-10-CM

## 2011-04-20 DIAGNOSIS — R87810 Cervical high risk human papillomavirus (HPV) DNA test positive: Secondary | ICD-10-CM | POA: Insufficient documentation

## 2011-04-20 DIAGNOSIS — Z124 Encounter for screening for malignant neoplasm of cervix: Secondary | ICD-10-CM | POA: Insufficient documentation

## 2011-04-20 DIAGNOSIS — R059 Cough, unspecified: Secondary | ICD-10-CM

## 2011-04-20 DIAGNOSIS — R509 Fever, unspecified: Secondary | ICD-10-CM

## 2011-05-04 ENCOUNTER — Ambulatory Visit
Admission: RE | Admit: 2011-05-04 | Discharge: 2011-05-04 | Disposition: A | Discharge status: Home / Self Care | Payer: Medicare Other | Source: Ambulatory Visit | Attending: Family Medicine | Admitting: Family Medicine

## 2011-05-04 ENCOUNTER — Other Ambulatory Visit: Payer: Self-pay | Admitting: Family Medicine

## 2011-05-04 DIAGNOSIS — R059 Cough, unspecified: Secondary | ICD-10-CM

## 2011-05-04 DIAGNOSIS — R05 Cough: Secondary | ICD-10-CM

## 2011-05-04 DIAGNOSIS — R509 Fever, unspecified: Secondary | ICD-10-CM

## 2011-07-07 ENCOUNTER — Other Ambulatory Visit: Payer: Self-pay | Admitting: Obstetrics and Gynecology

## 2011-07-25 ENCOUNTER — Encounter (HOSPITAL_COMMUNITY): Payer: Self-pay

## 2011-07-25 ENCOUNTER — Emergency Department (HOSPITAL_COMMUNITY)
Admission: EM | Admit: 2011-07-25 | Discharge: 2011-07-25 | Disposition: A | Discharge status: Home / Self Care | Payer: Medicare Other | Source: Home / Self Care | Attending: Emergency Medicine | Admitting: Emergency Medicine

## 2011-07-25 DIAGNOSIS — Z79899 Other long term (current) drug therapy: Secondary | ICD-10-CM

## 2011-07-25 DIAGNOSIS — N898 Other specified noninflammatory disorders of vagina: Secondary | ICD-10-CM

## 2011-07-25 DIAGNOSIS — N899 Noninflammatory disorder of vagina, unspecified: Secondary | ICD-10-CM

## 2011-07-25 DIAGNOSIS — Z113 Encounter for screening for infections with a predominantly sexual mode of transmission: Secondary | ICD-10-CM

## 2011-07-25 HISTORY — DX: Herpesviral infection of other urogenital tract: A60.09

## 2011-07-25 HISTORY — DX: Rheumatoid arthritis, unspecified: M06.9

## 2011-07-25 HISTORY — DX: Essential (primary) hypertension: I10

## 2011-07-25 LAB — POCT URINALYSIS DIP (DEVICE)
Bilirubin Urine: NEGATIVE
Glucose, UA: NEGATIVE mg/dL
Ketones, ur: NEGATIVE mg/dL
Nitrite: NEGATIVE

## 2011-07-25 LAB — WET PREP, GENITAL: Clue Cells Wet Prep HPF POC: NONE SEEN

## 2011-07-25 NOTE — ED Provider Notes (Signed)
History     CSN: [id number]  Arrival date & time 07/25/11  1325   First MD Initiated Contact with Patient 07/25/11 1402      Chief Complaint  Patient presents with  . Vaginal Itching    (Consider location/radiation/quality/duration/timing/severity/associated sxs/prior treatment) HPI Comments: Pt had unknown gyn procedure on 3/26 for abnormal pap smear.  No problems post procedure.  Had sexual intercourse for the first time since procedure evening of 4/12.  Felt ok initially, but has had burning, irritated, occasionally itchy feeling vaginally since morning of 4/13.  Feels worse if urine touches area while urinating.      Patient is a 41 y.o. female presenting with vaginal itching. The history is provided by the patient.  Vaginal Itching This is a new problem. The current episode started yesterday. The problem occurs constantly. The problem has not changed since onset.Pertinent negatives include no abdominal pain. Exacerbated by: urination. The symptoms are relieved by nothing. Treatments tried: ibuprofen. The treatment provided no relief.    Past Medical History  Diagnosis Date  . Hypertension   . Rheumatoid arthritis   . Herpes simplex of female genitalia   . Cardiomyopathy     Past Surgical History  Procedure Date  . Dental surgery     History reviewed. No pertinent family history.  History  Substance Use Topics  . Smoking status: Never Smoker   . Smokeless tobacco: Not on file  . Alcohol Use: No    OB History    Grav Para Term Preterm Abortions TAB SAB Ect Mult Living                  Review of Systems  Constitutional: Negative for fever and chills.  Gastrointestinal: Negative for nausea, vomiting, abdominal pain and diarrhea.  Genitourinary: Positive for vaginal pain. Negative for dysuria, hematuria, flank pain, vaginal bleeding, vaginal discharge and dyspareunia.       Pt hasn't noticed a change in vaginal d/c from 4/12 to now; has had some d/c post  procedure, but is not malodorous.     Allergies  Clarithromycin; Hydrocodone; and Penicillins  Home Medications   Current Outpatient Rx  Name Route Sig Dispense Refill  . ACYCLOVIR 200 MG PO CAPS Oral Take by mouth every 4 (four) hours while awake.    . ALBUTEROL SULFATE HFA 108 (90 BASE) MCG/ACT IN AERS Inhalation Inhale 2 puffs into the lungs every 6 (six) hours as needed.    Marland Kitchen BENZONATATE 100 MG PO CAPS Oral Take 100 mg by mouth 3 (three) times daily as needed.    . BUDESONIDE-FORMOTEROL FUMARATE 80-4.5 MCG/ACT IN AERO Inhalation Inhale into the lungs 2 (two) times daily.    Marland Kitchen CETIRIZINE HCL 10 MG PO TABS Oral Take 10 mg by mouth daily.    . CYCLOBENZAPRINE HCL 10 MG PO TABS Oral Take 10 mg by mouth 3 (three) times daily as needed.    Marland Kitchen DIPHENHYDRAMINE HCL (SLEEP) 25 MG PO TABS Oral Take 25 mg by mouth at bedtime as needed.    Marland Kitchen DIPHENOXYLATE-ATROPINE 2.5-0.025 MG PO TABS Oral Take 1 tablet by mouth 4 (four) times daily as needed.    Marland Kitchen ESOMEPRAZOLE MAGNESIUM 40 MG PO CPDR Oral Take 40 mg by mouth daily before breakfast.    . FLUTICASONE PROPIONATE 50 MCG/ACT NA SUSP Nasal Place 2 sprays into the nose daily.    Marland Kitchen FLUTICASONE FUROATE 27.5 MCG/SPRAY NA SUSP Nasal Place 2 sprays into the nose daily.    Marland Kitchen FOLIC  ACID 1 MG PO TABS Oral Take 1 mg by mouth daily.    Marland Kitchen HYDROXYCHLOROQUINE SULFATE 200 MG PO TABS Oral Take by mouth daily.    . IBUPROFEN 600 MG PO TABS Oral Take 600 mg by mouth every 6 (six) hours as needed.    Marland Kitchen LEVOFLOXACIN 500 MG PO TABS Oral Take by mouth daily.    Marland Kitchen LOSARTAN POTASSIUM 50 MG PO TABS Oral Take 50 mg by mouth daily.    Marland Kitchen METHOCARBAMOL 500 MG PO TABS Oral Take 500 mg by mouth 4 (four) times daily.    Marland Kitchen METHOTREXATE SODIUM 5 MG PO TABS Oral Take 5 mg by mouth once a week. Caution: Chemotherapy. Protect from light.    Marland Kitchen METOPROLOL SUCCINATE ER 50 MG PO TB24 Oral Take 50 mg by mouth daily. Take with or immediately following a meal.    . POTASSIUM CHLORIDE CRYS ER 20  MEQ PO TBCR Oral Take 20 mEq by mouth 2 (two) times daily.    Marland Kitchen PREDNISONE 5 MG PO TABS Oral Take 5 mg by mouth daily.    . TRAMADOL HCL 50 MG PO TABS Oral Take 50 mg by mouth every 6 (six) hours as needed.      BP 122/72  Pulse 94  Temp(Src) 98.7 F (37.1 C) (Oral)  Resp 18  SpO2 98%  LMP 07/16/2011  Physical Exam  Constitutional: She appears well-developed and well-nourished. No distress.       Morbidly obese  Cardiovascular: Normal rate and regular rhythm.   Pulmonary/Chest: Effort normal and breath sounds normal.  Abdominal: Soft. Bowel sounds are normal. She exhibits no distension. There is no tenderness. There is no guarding.  Genitourinary: Cervix exhibits no motion tenderness and no discharge. There are signs of injury around the vagina. Vaginal discharge found.       Slight superificial tearing/irritation to the posterior almost external aspect of vagina- this is location of patient's pain. There is evidence of old trauma to cervix (likely from whatever procedure pt had done) that is healing well.  Vaginal d/c most likely related to this healing tissue.     ED Course  Procedures (including critical care time)  Labs Reviewed  POCT URINALYSIS DIP (DEVICE) - Abnormal; Notable for the following:    Hgb urine dipstick SMALL (*)    Protein, ur 30 (*)    Leukocytes, UA SMALL (*) Biochemical Testing Only. Please order routine urinalysis from main lab if confirmatory testing is needed.   All other components within normal limits  POCT PREGNANCY, URINE  GC/CHLAMYDIA PROBE AMP, GENITAL  WET PREP, GENITAL   No results found.   1. Vaginal irritation       MDM          Cathlyn Parsons, NP 07/25/11 1645

## 2011-07-25 NOTE — Discharge Instructions (Signed)
Try using KY jelly if the irritated areas are really bothering you.  Also consider using KY jelly during sexual activity.    Follow up with your gynecologist if you feel you are not getting better.

## 2011-07-25 NOTE — ED Notes (Signed)
Pt has vaginal burning when urinating, itching and discharge.  Pt had procedure done for abdnormal pap on 3-26 and now has itching and burning.

## 2011-07-26 ENCOUNTER — Telehealth (HOSPITAL_COMMUNITY): Payer: Self-pay | Admitting: *Deleted

## 2011-07-26 LAB — GC/CHLAMYDIA PROBE AMP, GENITAL
Chlamydia, DNA Probe: NEGATIVE
GC Probe Amp, Genital: NEGATIVE

## 2011-07-26 NOTE — ED Notes (Signed)
GC/Chlamydia neg., Wet prep: few yeast, mod. WBC's. No treatment. Shown to Dr. Chaney [name]. She said it could be her Herpes flaring up. She said to call pt. and ask her if she has a discharge or is it like her Herpes. I called pt. and she said its not like her Herpes. She does have a discharge, but it is clear and brownish, with a little blood mixed with it. States she is just coming off her period. I told her I would call back. [name] 07/26/2011

## 2011-07-26 NOTE — ED Notes (Signed)
Discussed with Dr. Chaney Malling and she said she thinks the discharge she is describing is just her coming off her period. She thinks the irritation and burning are from the superficial tear/injury to the vagina.  She is not going to treat the yeast at this time. I called pt. back and told her this. I told her if the discharge gets white and clumpy like yeast to call back. Pt. voiced understanding. [name] 07/26/2011

## 2011-07-27 NOTE — ED Provider Notes (Signed)
Medical screening examination/treatment/procedure(s) were performed by non-physician practitioner and as supervising physician I was immediately available for consultation/collaboration.  Saulo Anthis M. MD   Kess Mcilwain M Arin Vanosdol, MD 07/27/11 2322 

## 2011-10-04 ENCOUNTER — Ambulatory Visit
Admission: RE | Admit: 2011-10-04 | Discharge: 2011-10-04 | Disposition: A | Discharge status: Home / Self Care | Payer: Medicare Other | Source: Ambulatory Visit | Attending: Family Medicine | Admitting: Family Medicine

## 2011-10-04 ENCOUNTER — Other Ambulatory Visit: Payer: Self-pay | Admitting: Family Medicine

## 2011-10-04 DIAGNOSIS — M79606 Pain in leg, unspecified: Secondary | ICD-10-CM

## 2011-12-26 IMAGING — CR DG CHEST 2V
2 series · 2 of 2 positions shown · non-contrast
Comparison: Chest x-ray of 05/22/2009

CLINICAL DATA: Shortness of breath, cough

CHEST - 2 VIEW

[w chest pa]
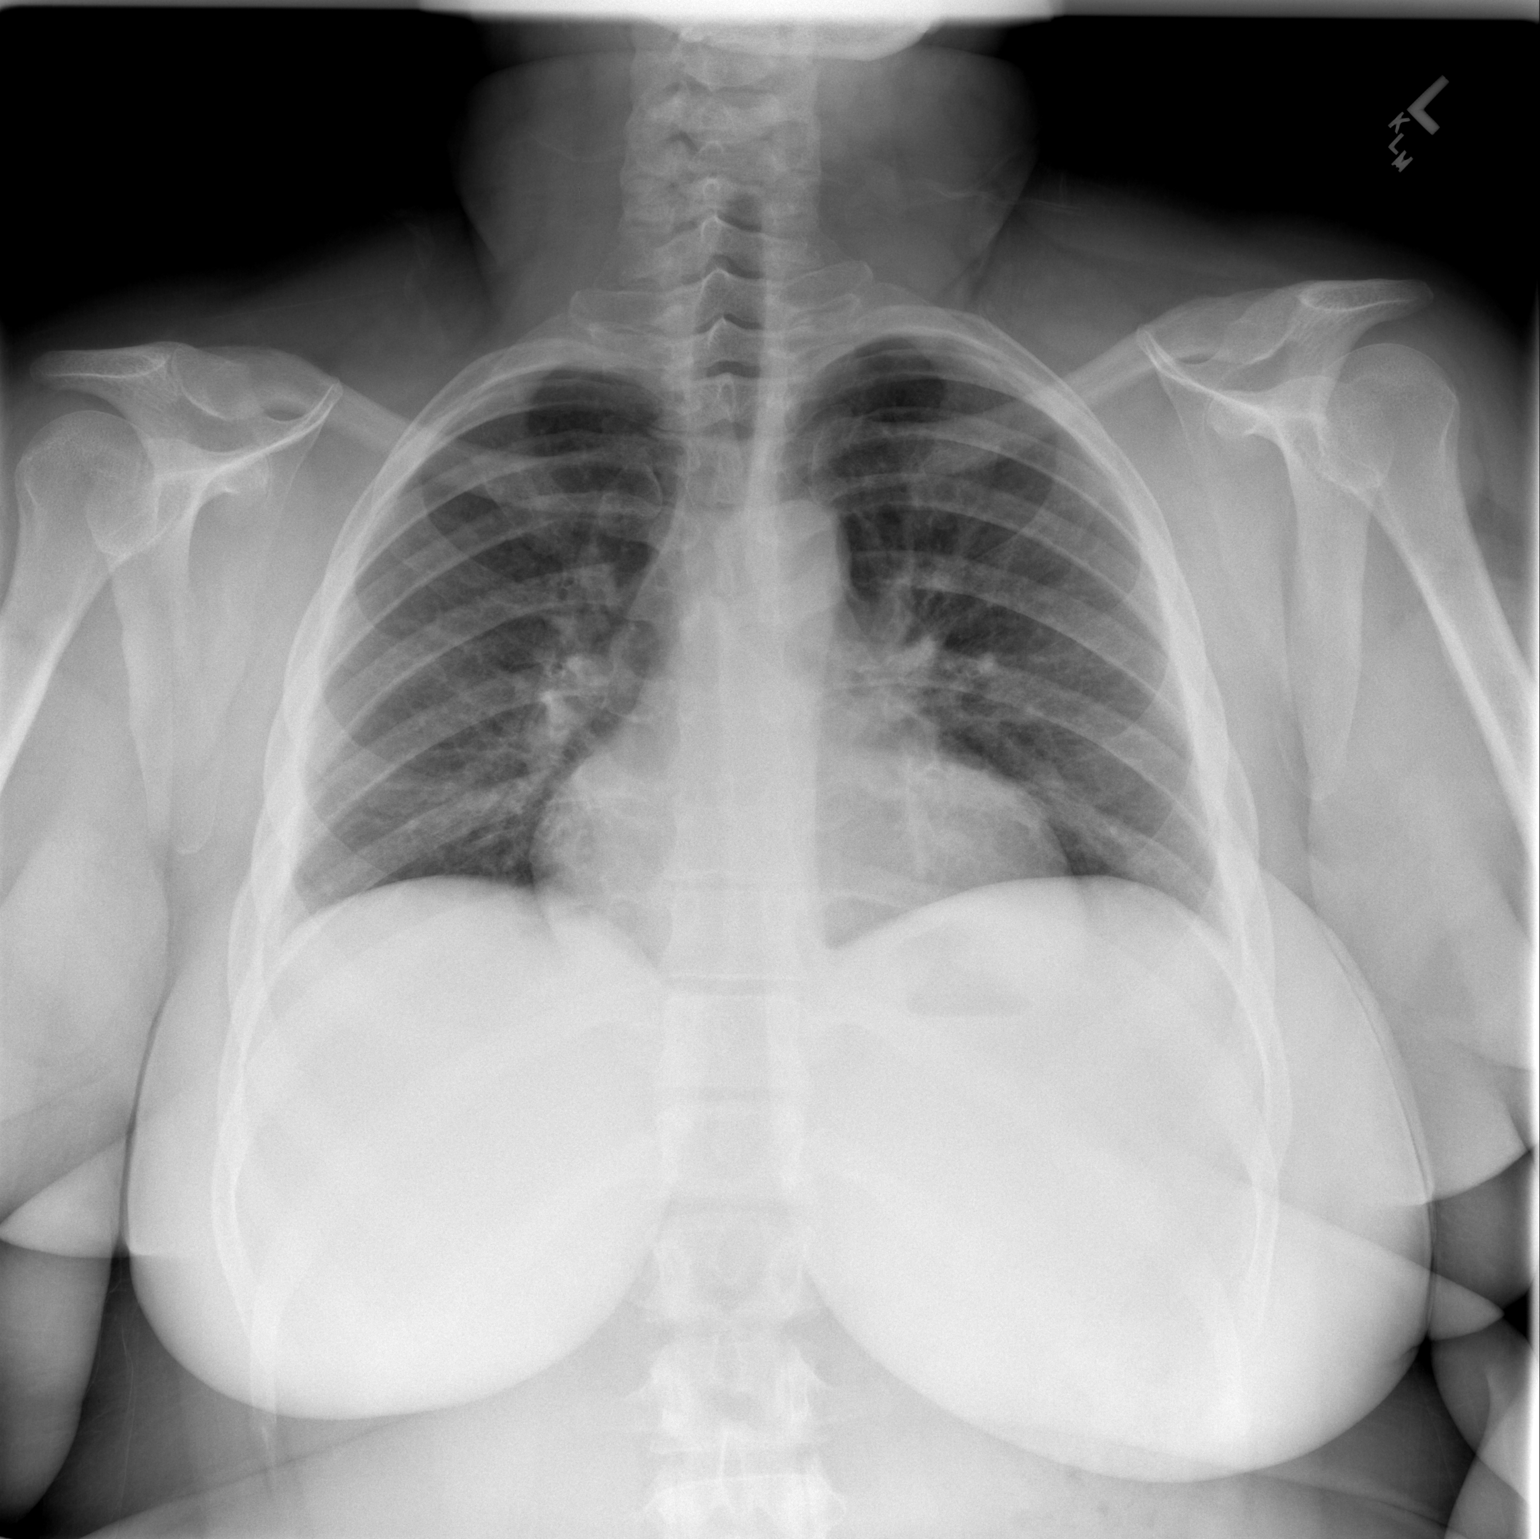

[w chest lat]
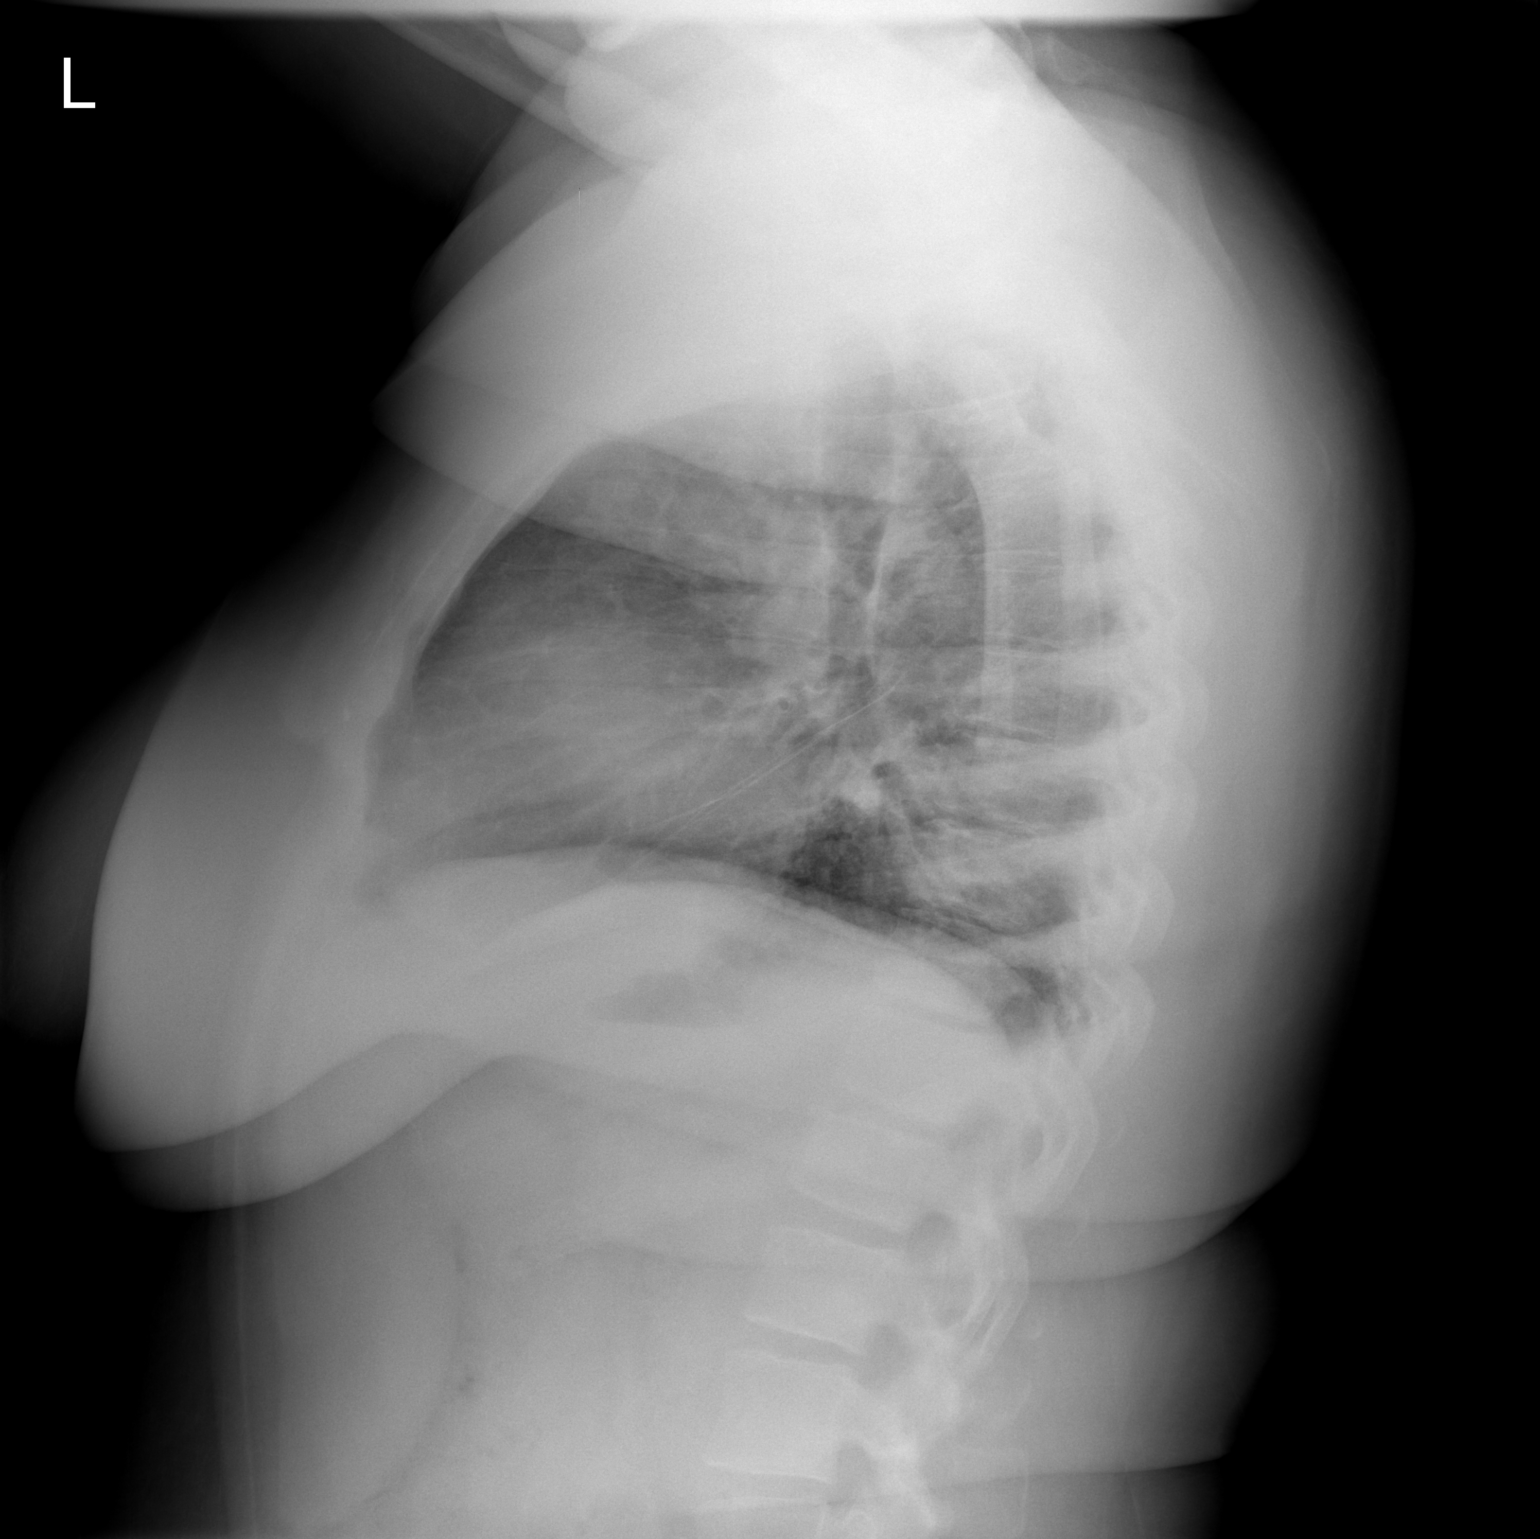

[2 of 2 positions shown; findings below may reference images not displayed]

FINDINGS: No pneumonia is seen.  There is peribronchial thickening
which may indicate bronchitis.  The heart is within normal limits
in size.  No bony abnormality is seen.
IMPRESSION: No active lung disease.  Peribronchial thickening may indicate
bronchitis.

## 2012-03-02 ENCOUNTER — Inpatient Hospital Stay (HOSPITAL_COMMUNITY)
Admission: EM | Admit: 2012-03-02 | Discharge: 2012-03-05 | DRG: 292 | Disposition: A | Discharge status: Home / Self Care | Payer: Medicare Other | Attending: Internal Medicine | Admitting: Internal Medicine

## 2012-03-02 ENCOUNTER — Encounter (HOSPITAL_COMMUNITY): Payer: Self-pay

## 2012-03-02 ENCOUNTER — Other Ambulatory Visit: Payer: Self-pay | Admitting: Family Medicine

## 2012-03-02 ENCOUNTER — Emergency Department (HOSPITAL_COMMUNITY): Payer: Medicare Other

## 2012-03-02 ENCOUNTER — Ambulatory Visit
Admission: RE | Admit: 2012-03-02 | Discharge: 2012-03-02 | Disposition: A | Discharge status: Home / Self Care | Payer: Medicare Other | Source: Ambulatory Visit | Attending: Family Medicine | Admitting: Family Medicine

## 2012-03-02 DIAGNOSIS — I10 Essential (primary) hypertension: Secondary | ICD-10-CM | POA: Diagnosis present

## 2012-03-02 DIAGNOSIS — R0602 Shortness of breath: Secondary | ICD-10-CM

## 2012-03-02 DIAGNOSIS — M069 Rheumatoid arthritis, unspecified: Secondary | ICD-10-CM | POA: Diagnosis present

## 2012-03-02 DIAGNOSIS — I509 Heart failure, unspecified: Secondary | ICD-10-CM | POA: Diagnosis present

## 2012-03-02 DIAGNOSIS — E876 Hypokalemia: Secondary | ICD-10-CM | POA: Diagnosis present

## 2012-03-02 DIAGNOSIS — I5023 Acute on chronic systolic (congestive) heart failure: Principal | ICD-10-CM | POA: Diagnosis present

## 2012-03-02 DIAGNOSIS — K219 Gastro-esophageal reflux disease without esophagitis: Secondary | ICD-10-CM | POA: Diagnosis present

## 2012-03-02 DIAGNOSIS — Z6841 Body Mass Index (BMI) 40.0 and over, adult: Secondary | ICD-10-CM

## 2012-03-02 DIAGNOSIS — J45909 Unspecified asthma, uncomplicated: Secondary | ICD-10-CM

## 2012-03-02 DIAGNOSIS — I011 Acute rheumatic endocarditis: Secondary | ICD-10-CM | POA: Insufficient documentation

## 2012-03-02 DIAGNOSIS — Z9119 Patient's noncompliance with other medical treatment and regimen: Secondary | ICD-10-CM

## 2012-03-02 DIAGNOSIS — Z91199 Patient's noncompliance with other medical treatment and regimen due to unspecified reason: Secondary | ICD-10-CM

## 2012-03-02 DIAGNOSIS — M329 Systemic lupus erythematosus, unspecified: Secondary | ICD-10-CM | POA: Diagnosis present

## 2012-03-02 DIAGNOSIS — E119 Type 2 diabetes mellitus without complications: Secondary | ICD-10-CM | POA: Diagnosis present

## 2012-03-02 LAB — BASIC METABOLIC PANEL
BUN: 9 mg/dL (ref 6–23)
Chloride: 102 mEq/L (ref 96–112)
Creatinine, Ser: 0.63 mg/dL (ref 0.50–1.10)
GFR calc Af Amer: >90 mL/min (ref >90)
GFR calc non Af Amer: >90 mL/min (ref >90)
Potassium: 3.3 mEq/L — ABNORMAL LOW (ref 3.5–5.1)

## 2012-03-02 LAB — POCT I-STAT TROPONIN I: Troponin i, poc: 0 ng/mL (ref 0.00–0.08)

## 2012-03-02 LAB — CBC WITH DIFFERENTIAL/PLATELET
Basophils Absolute: 0 10*3/uL (ref 0.0–0.1)
Basophils Relative: 1 % (ref 0–1)
Eosinophils Absolute: 0.1 10*3/uL (ref 0.0–0.7)
MCH: 25.8 pg — ABNORMAL LOW (ref 26.0–34.0)
MCHC: 31.3 g/dL (ref 30.0–36.0)
Monocytes Relative: 7 % (ref 3–12)
Neutro Abs: 4.9 10*3/uL (ref 1.7–7.7)
Neutrophils Relative %: 58 % (ref 43–77)
RDW: 16.4 % — ABNORMAL HIGH (ref 11.5–15.5)

## 2012-03-02 LAB — PRO B NATRIURETIC PEPTIDE: Pro B Natriuretic peptide (BNP): 2197 pg/mL — ABNORMAL HIGH (ref 0–125)

## 2012-03-02 MED ORDER — SODIUM CHLORIDE 0.9 % IV SOLN
INTRAVENOUS | Status: DC
  Administered 2012-03-02: 21:00:00 via INTRAVENOUS

## 2012-03-02 MED ORDER — FUROSEMIDE 10 MG/ML IJ SOLN
60.0000 mg | Freq: Once | INTRAMUSCULAR | Status: AC
  Administered 2012-03-02: 60 mg via INTRAVENOUS
  Filled 2012-03-02: qty 8

## 2012-03-02 NOTE — H&P (Signed)
PCP:   Elie Confer, MD  Cardiology: Dr. Mayford Knife  Chief Complaint:  Shortness of breath  HPI: This is a 41 year old female who states that approximately 2 weeks ago she developed a cough and shortness of breath. She thought she had asthma, she used her inhaler with no effect. Approximately 2 days ago she went and saw her PCP who prescribed a Zpack and give her a steroid shot. The patient states she continued to feel worse and therefore she returned to her PCP today. Chest x-ray done revealed congestive heart failure. She was sent to the ER. The patient does have a history of recurrent congestive heart failure, the last episode was approximately 2 years ago. She sees Dr. Mayford Knife. She states her last heart cath was 2 years ago, it was normal. Her last stress test was approximately one year ago. She last saw Dr. Mayford Knife a year ago. Patient does report some chest pain on and off over the last 2 weeks. Pain is located on the right side of the chest. She states it been associated with activity, at its worst it was 7/10. She has had no chest pain over the past week. She denies any palpitation, no fluid retention. She states she's not on Lasix and has not been for almost 2 years. She denies increase in fluid intake or salt intake.  Review of Systems:  The patient denies anorexia, fever, weight loss,, vision loss, decreased hearing, hoarseness, syncope, dyspnea on exertion, peripheral edema, balance deficits, hemoptysis, abdominal pain, melena, hematochezia, severe indigestion/heartburn, hematuria, incontinence, genital sores, muscle weakness, suspicious skin lesions, transient blindness, difficulty walking, depression, unusual weight change, abnormal bleeding, enlarged lymph nodes, angioedema, and breast masses.  Past Medical History: Past Medical History  Diagnosis Date  . Hypertension   . Rheumatoid arthritis   . Herpes simplex of female genitalia   . Cardiomyopathy   . Lupus    Past Surgical  History  Procedure Date  . Dental surgery     Medications: Prior to Admission medications   Medication Sig Start Date End Date Taking? Authorizing Provider  acyclovir (ZOVIRAX) 200 MG capsule Take 200 mg by mouth daily.    Yes Historical Provider, MD  albuterol (PROVENTIL HFA;VENTOLIN HFA) 108 (90 BASE) MCG/ACT inhaler Inhale 2 puffs into the lungs every 6 (six) hours as needed. Shortness of breath   Yes Historical Provider, MD  benzonatate (TESSALON) 100 MG capsule Take 100 mg by mouth 3 (three) times daily as needed. cough   Yes Historical Provider, MD  budesonide-formoterol (SYMBICORT) 80-4.5 MCG/ACT inhaler Inhale 2 puffs into the lungs 2 (two) times daily.    Yes Historical Provider, MD  cyclobenzaprine (FLEXERIL) 10 MG tablet Take 10 mg by mouth 3 (three) times daily as needed. Muscle spasms   Yes Historical Provider, MD  diphenhydrAMINE (SOMINEX) 25 MG tablet Take 25 mg by mouth at bedtime as needed. allergies   Yes Historical Provider, MD  esomeprazole (NEXIUM) 40 MG capsule Take 40 mg by mouth daily before breakfast.   Yes Historical Provider, MD  folic acid (FOLVITE) 1 MG tablet Take 1 mg by mouth daily.   Yes Historical Provider, MD  hydroxychloroquine (PLAQUENIL) 200 MG tablet Take 200 mg by mouth daily.    Yes Historical Provider, MD  ibuprofen (ADVIL,MOTRIN) 600 MG tablet Take 600 mg by mouth every 6 (six) hours as needed. pain   Yes Historical Provider, MD  losartan (COZAAR) 50 MG tablet Take 50 mg by mouth daily.   Yes Historical Provider, MD  metFORMIN (GLUCOPHAGE) 500 MG tablet Take 500 mg by mouth 2 (two) times daily with a meal.   Yes Historical Provider, MD  methocarbamol (ROBAXIN) 500 MG tablet Take 500 mg by mouth 2 (two) times daily as needed. Muscle spasms   Yes Historical Provider, MD  metoprolol succinate (TOPROL-XL) 50 MG 24 hr tablet Take 50 mg by mouth daily. Take with or immediately following a meal.   Yes Historical Provider, MD  predniSONE (DELTASONE) 5 MG  tablet Take 5 mg by mouth daily.   Yes Historical Provider, MD  traMADol (ULTRAM) 50 MG tablet Take 50 mg by mouth every 6 (six) hours as needed. pain   Yes Historical Provider, MD    Allergies:   Allergies  Allergen Reactions  . Clarithromycin     REACTION: upset stomach  . Hydrocodone     REACTION: nausea  . Penicillins Swelling    Social History:  reports that she has never smoked. She has never used smokeless tobacco. She reports that she does not drink alcohol or use illicit drugs.  Family History: Hypertension, diabetes mellitus  Physical Exam: Filed Vitals:   03/02/12 1811 03/02/12 1817  BP: 140/96   Pulse: 101   Temp: 98.2 F (36.8 C)   Resp: 20   SpO2: 96% 96%    General:  Alert and oriented times three, obese, no acute distress Eyes: PERRLA, pink conjunctiva, no scleral icterus ENT: Moist oral mucosa, neck supple, no thyromegaly Lungs: clear to ascultation, no wheeze, no crackles, no use of accessory muscles Cardiovascular: regular rate and rhythm, no regurgitation, no gallops, no murmurs. No carotid bruits, no JVD Abdomen: soft, positive BS, non-tender, non-distended, no organomegaly, not an acute abdomen GU: not examined Neuro: CN II - XII grossly intact, sensation intact Musculoskeletal: strength 5/5 all extremities, no clubbing, cyanosis or edema Skin: no rash, no subcutaneous crepitation, no decubitus Psych: appropriate patient   Labs on Admission:   [facility] 03/02/12 1920  NA 139  K 3.3*  CL 102  CO2 24  GLUCOSE 94  BUN 9  CREATININE 0.63  CALCIUM 9.0  MG --  PHOS --   No results found for this basename: AST:2,ALT:2,ALKPHOS:2,BILITOT:2,PROT:2,ALBUMIN:2 in the last 72 hours No results found for this basename: LIPASE:2,AMYLASE:2 in the last 72 hours  Basename 03/02/12 1920  WBC 8.5  NEUTROABS 4.9  HGB 9.4*  HCT 30.0*  MCV 82.2  PLT 397  Results for DYLAN, SORRENTINO (MRN [id number]) as of 03/02/2012 23:45  Ref. Range 03/02/2012 19:43  03/02/2012 20:20  Troponin i, poc Latest Range: 0.00-0.08 ng/mL 0.00   Pro B Natriuretic peptide (BNP) Latest Range: 0-125 pg/mL  2197.0 (H)     Micro Results: No results found for this or any previous visit (from the past 240 hour(s)).   Radiological Exams on Admission: Dg Chest 2 View  03/02/2012  *RADIOLOGY REPORT*  Clinical Data: Shortness of breath and cough.  CHEST - 2 VIEW  Comparison: 03/02/2012  Findings: Cardiomegaly noted with bilateral interstitial opacity. No overt airspace opacity.  No pleural effusion identified.  IMPRESSION:  1.  Cardiomegaly with bilateral interstitial accentuation favoring interstitial edema in the setting of congestive heart failure. Atypical pneumonia is a differential diagnostic consideration.   Original Report Authenticated By: Gaylyn Rong, M.D.    Dg Chest 2 View  03/02/2012  *RADIOLOGY REPORT*  Clinical Data: Shortness of breath.  CHEST - 2 VIEW  Comparison: 05/04/2011.  Findings: The heart is mildly enlarged but stable.  There is central vascular  congestion, peribronchial thickening and increased interstitial markings suggesting vascular congestion and probable mild interstitial edema.  No definite pleural effusion.  IMPRESSION: Cardiac enlargement and mild CHF.   Original Report Authenticated By: Rudie Meyer, M.D.    EKG: Normal sinus rhythm  Assessment/Plan Present on Admission:  . CHF (congestive heart failure) Cardiomyopathy Admit to telemetry Lasix 40 mg IV every 12 BMP in a.m., cycle cardiac enzymes Oxygen and nebulizers as needed  patient's most recent 2-D echo was in 2011 EF 50-60%  Rheumatoid arthritis History of lupus Diabetes mellitus Morbid obesity  stable ADA diet Sensitive insulin Resume home medications   Full code DVT prophylaxis   Rabia Argote 03/02/2012, 11:45 PM

## 2012-03-02 NOTE — ED Notes (Signed)
Crosley, MD at bedside. 

## 2012-03-02 NOTE — ED Notes (Signed)
Patient reports that she has had SOB  and a prouductive cough at night x 2 weeks.Patient was seen by PCP yesterday and a CXR was done. Patient was instructed to come to the ED for evaluation and treatment of mild CHF.

## 2012-03-02 NOTE — ED Provider Notes (Signed)
History     CSN: [id number]  Arrival date & time 03/02/12  1805   First MD Initiated Contact with Patient 03/02/12 2004      No chief complaint on file.   (Consider location/radiation/quality/duration/timing/severity/associated sxs/prior treatment) The history is provided by the patient.   patient here with shortness of breath x3-4 days is worse with lying flat and walking around. An x-ray today prior to arrival which did show heart failure. She does have a prior history of CHF but has not taken medications currently. Denies any chest pain chest pressure. Does note some increasing pedal edema. Topics been nonproductive and not associated with fever. Her Dr. call her with results and she was sent here for further evaluation  Past Medical History  Diagnosis Date  . Hypertension   . Rheumatoid arthritis   . Herpes simplex of female genitalia   . Cardiomyopathy   . Lupus     Past Surgical History  Procedure Date  . Dental surgery     No family history on file.  History  Substance Use Topics  . Smoking status: Never Smoker   . Smokeless tobacco: Never Used  . Alcohol Use: No    OB History    Grav Para Term Preterm Abortions TAB SAB Ect Mult Living                  Review of Systems  All other systems reviewed and are negative.    Allergies  Clarithromycin; Hydrocodone; and Penicillins  Home Medications   Current Outpatient Rx  Name  Route  Sig  Dispense  Refill  . ACYCLOVIR 200 MG PO CAPS   Oral   Take 200 mg by mouth daily.          . ALBUTEROL SULFATE HFA 108 (90 BASE) MCG/ACT IN AERS   Inhalation   Inhale 2 puffs into the lungs every 6 (six) hours as needed. Shortness of breath         . BENZONATATE 100 MG PO CAPS   Oral   Take 100 mg by mouth 3 (three) times daily as needed. cough         . BUDESONIDE-FORMOTEROL FUMARATE 80-4.5 MCG/ACT IN AERO   Inhalation   Inhale 2 puffs into the lungs 2 (two) times daily.          . CYCLOBENZAPRINE  HCL 10 MG PO TABS   Oral   Take 10 mg by mouth 3 (three) times daily as needed. Muscle spasms         . DIPHENHYDRAMINE HCL (SLEEP) 25 MG PO TABS   Oral   Take 25 mg by mouth at bedtime as needed. allergies         . ESOMEPRAZOLE MAGNESIUM 40 MG PO CPDR   Oral   Take 40 mg by mouth daily before breakfast.         . FOLIC ACID 1 MG PO TABS   Oral   Take 1 mg by mouth daily.         Marland Kitchen HYDROXYCHLOROQUINE SULFATE 200 MG PO TABS   Oral   Take 200 mg by mouth daily.          . IBUPROFEN 600 MG PO TABS   Oral   Take 600 mg by mouth every 6 (six) hours as needed. pain         . LOSARTAN POTASSIUM 50 MG PO TABS   Oral   Take 50 mg by mouth daily.         Marland Kitchen  METHOCARBAMOL 500 MG PO TABS   Oral   Take 500 mg by mouth 2 (two) times daily as needed. Muscle spasms         . METOPROLOL SUCCINATE ER 50 MG PO TB24   Oral   Take 50 mg by mouth daily. Take with or immediately following a meal.         . PREDNISONE 5 MG PO TABS   Oral   Take 5 mg by mouth daily.         . TRAMADOL HCL 50 MG PO TABS   Oral   Take 50 mg by mouth every 6 (six) hours as needed. pain           BP 140/96  Pulse 101  Temp 98.2 F (36.8 C)  Resp 20  SpO2 96%  LMP 02/15/2012  Physical Exam  Nursing note and vitals reviewed. Constitutional: She is oriented to person, place, and time. She appears well-developed and well-nourished.  Non-toxic appearance. No distress.  HENT:  Head: Normocephalic and atraumatic.  Eyes: Conjunctivae normal, EOM and lids are normal. Pupils are equal, round, and reactive to light.  Neck: Normal range of motion. Neck supple. No tracheal deviation present. No mass present.  Cardiovascular: Normal rate, regular rhythm and normal heart sounds.  Exam reveals no gallop.   No murmur heard. Pulmonary/Chest: Effort normal. No stridor. No respiratory distress. She has decreased breath sounds. She has no wheezes. She has rhonchi. She has no rales.  Abdominal:  Soft. Normal appearance and bowel sounds are normal. She exhibits no distension. There is no tenderness. There is no rebound and no CVA tenderness.  Musculoskeletal: Normal range of motion. She exhibits no edema and no tenderness.  Neurological: She is alert and oriented to person, place, and time. She has normal strength. No cranial nerve deficit or sensory deficit. GCS eye subscore is 4. GCS verbal subscore is 5. GCS motor subscore is 6.  Skin: Skin is warm and dry. No abrasion and no rash noted.  Psychiatric: She has a normal mood and affect. Her speech is normal and behavior is normal.    ED Course  Procedures (including critical care time)  Labs Reviewed  CBC WITH DIFFERENTIAL - Abnormal; Notable for the following:    RBC 3.65 (*)     Hemoglobin 9.4 (*)     HCT 30.0 (*)     MCH 25.8 (*)     RDW 16.4 (*)     All other components within normal limits  POCT I-STAT TROPONIN I  BASIC METABOLIC PANEL  PRO B NATRIURETIC PEPTIDE   Dg Chest 2 View  03/02/2012  *RADIOLOGY REPORT*  Clinical Data: Shortness of breath.  CHEST - 2 VIEW  Comparison: 05/04/2011.  Findings: The heart is mildly enlarged but stable.  There is central vascular congestion, peribronchial thickening and increased interstitial markings suggesting vascular congestion and probable mild interstitial edema.  No definite pleural effusion.  IMPRESSION: Cardiac enlargement and mild CHF.   Original Report Authenticated By: Rudie Meyer, M.D.      No diagnosis found.    MDM   Date: 03/02/2012  Rate: 97  Rhythm: normal sinus rhythm  QRS Axis: normal  Intervals: normal  ST/T Wave abnormalities: nonspecific ST changes  Conduction Disutrbances:none  Narrative Interpretation:   Old EKG Reviewed: none available   10:31 PM Patient given IV Lasix here and will be admitted for CHF exacerbation  CRITICAL CARE Performed by: Toy Baker   Total critical care time:  50  Critical care time was exclusive of separately  billable procedures and treating other patients.  Critical care was necessary to treat or prevent imminent or life-threatening deterioration.  Critical care was time spent personally by me on the following activities: development of treatment plan with patient and/or surrogate as well as nursing, discussions with consultants, evaluation of patient's response to treatment, examination of patient, obtaining history from patient or surrogate, ordering and performing treatments and interventions, ordering and review of laboratory studies, ordering and review of radiographic studies, pulse oximetry and re-evaluation of patient's condition.       Toy Baker, MD 03/02/12 2232

## 2012-03-03 DIAGNOSIS — I10 Essential (primary) hypertension: Secondary | ICD-10-CM | POA: Diagnosis present

## 2012-03-03 DIAGNOSIS — E119 Type 2 diabetes mellitus without complications: Secondary | ICD-10-CM | POA: Diagnosis present

## 2012-03-03 DIAGNOSIS — Z6841 Body Mass Index (BMI) 40.0 and over, adult: Secondary | ICD-10-CM

## 2012-03-03 DIAGNOSIS — M069 Rheumatoid arthritis, unspecified: Secondary | ICD-10-CM

## 2012-03-03 DIAGNOSIS — J45909 Unspecified asthma, uncomplicated: Secondary | ICD-10-CM

## 2012-03-03 DIAGNOSIS — E876 Hypokalemia: Secondary | ICD-10-CM

## 2012-03-03 DIAGNOSIS — M329 Systemic lupus erythematosus, unspecified: Secondary | ICD-10-CM

## 2012-03-03 DIAGNOSIS — K219 Gastro-esophageal reflux disease without esophagitis: Secondary | ICD-10-CM | POA: Diagnosis present

## 2012-03-03 LAB — CBC
HCT: 32.6 % — ABNORMAL LOW (ref 36.0–46.0)
MCH: 26.2 pg (ref 26.0–34.0)
MCHC: 32.2 g/dL (ref 30.0–36.0)
MCV: 81.3 fL (ref 78.0–100.0)
RDW: 16.5 % — ABNORMAL HIGH (ref 11.5–15.5)

## 2012-03-03 LAB — GLUCOSE, CAPILLARY
Glucose-Capillary: 118 mg/dL — ABNORMAL HIGH (ref 70–99)
Glucose-Capillary: 81 mg/dL (ref 70–99)
Glucose-Capillary: 90 mg/dL (ref 70–99)
Glucose-Capillary: 134 mg/dL — ABNORMAL HIGH (ref 70–99)

## 2012-03-03 LAB — LIPID PANEL
Cholesterol: 139 mg/dL (ref 0–200)
HDL: 40 mg/dL (ref >39)
Total CHOL/HDL Ratio: 3.5 RATIO

## 2012-03-03 LAB — POTASSIUM: Potassium: 3.2 mEq/L — ABNORMAL LOW (ref 3.5–5.1)

## 2012-03-03 LAB — MRSA PCR SCREENING: MRSA by PCR: NEGATIVE

## 2012-03-03 LAB — BASIC METABOLIC PANEL
BUN: 9 mg/dL (ref 6–23)
CO2: 29 mEq/L (ref 19–32)
Chloride: 99 mEq/L (ref 96–112)
Creatinine, Ser: 0.65 mg/dL (ref 0.50–1.10)
GFR calc Af Amer: >90 mL/min (ref >90)
Glucose, Bld: 103 mg/dL — ABNORMAL HIGH (ref 70–99)

## 2012-03-03 LAB — HEMOGLOBIN A1C
Hgb A1c MFr Bld: 6 % — ABNORMAL HIGH (ref <5.7)
Mean Plasma Glucose: 126 mg/dL — ABNORMAL HIGH (ref <117)

## 2012-03-03 LAB — MAGNESIUM: Magnesium: 1.5 mg/dL (ref 1.5–2.5)

## 2012-03-03 MED ORDER — TRAMADOL HCL 50 MG PO TABS
50.0000 mg | ORAL_TABLET | Freq: Two times a day (BID) | ORAL | Status: DC | PRN
  Administered 2012-03-03: 50 mg via ORAL
  Filled 2012-03-03: qty 1

## 2012-03-03 MED ORDER — POTASSIUM CHLORIDE CRYS ER 20 MEQ PO TBCR
40.0000 meq | EXTENDED_RELEASE_TABLET | Freq: Once | ORAL | Status: AC
  Administered 2012-03-03: 40 meq via ORAL
  Filled 2012-03-03: qty 2

## 2012-03-03 MED ORDER — ALUM & MAG HYDROXIDE-SIMETH 200-200-20 MG/5ML PO SUSP
30.0000 mL | Freq: Four times a day (QID) | ORAL | Status: DC | PRN
  Administered 2012-03-03: 30 mL via ORAL
  Filled 2012-03-03: qty 30

## 2012-03-03 MED ORDER — FOLIC ACID 1 MG PO TABS
1.0000 mg | ORAL_TABLET | Freq: Every day | ORAL | Status: DC
  Administered 2012-03-03 – 2012-03-05 (×3): 1 mg via ORAL
  Filled 2012-03-03 (×3): qty 1

## 2012-03-03 MED ORDER — SODIUM CHLORIDE 0.9 % IJ SOLN
3.0000 mL | Freq: Two times a day (BID) | INTRAMUSCULAR | Status: DC
  Administered 2012-03-03 – 2012-03-05 (×5): 3 mL via INTRAVENOUS

## 2012-03-03 MED ORDER — FUROSEMIDE 10 MG/ML IJ SOLN
40.0000 mg | Freq: Two times a day (BID) | INTRAMUSCULAR | Status: DC
  Administered 2012-03-03 – 2012-03-04 (×4): 40 mg via INTRAVENOUS
  Filled 2012-03-03 (×5): qty 4

## 2012-03-03 MED ORDER — METHOCARBAMOL 500 MG PO TABS
500.0000 mg | ORAL_TABLET | Freq: Four times a day (QID) | ORAL | Status: DC | PRN
  Administered 2012-03-03 – 2012-03-05 (×4): 500 mg via ORAL
  Filled 2012-03-03 (×4): qty 1

## 2012-03-03 MED ORDER — CYCLOBENZAPRINE HCL 10 MG PO TABS
10.0000 mg | ORAL_TABLET | Freq: Three times a day (TID) | ORAL | Status: DC | PRN
  Administered 2012-03-03: 10 mg via ORAL
  Filled 2012-03-03 (×2): qty 1

## 2012-03-03 MED ORDER — METFORMIN HCL 500 MG PO TABS
500.0000 mg | ORAL_TABLET | Freq: Two times a day (BID) | ORAL | Status: DC
  Administered 2012-03-03 – 2012-03-05 (×5): 500 mg via ORAL
  Filled 2012-03-03 (×8): qty 1

## 2012-03-03 MED ORDER — TRAMADOL HCL 50 MG PO TABS
50.0000 mg | ORAL_TABLET | Freq: Four times a day (QID) | ORAL | Status: DC
  Administered 2012-03-03 – 2012-03-05 (×10): 50 mg via ORAL
  Filled 2012-03-03 (×13): qty 1

## 2012-03-03 MED ORDER — ENOXAPARIN SODIUM 60 MG/0.6ML ~~LOC~~ SOLN
50.0000 mg | SUBCUTANEOUS | Status: DC
  Administered 2012-03-03 – 2012-03-04 (×2): 50 mg via SUBCUTANEOUS
  Filled 2012-03-03 (×3): qty 0.6

## 2012-03-03 MED ORDER — PANTOPRAZOLE SODIUM 40 MG PO TBEC
40.0000 mg | DELAYED_RELEASE_TABLET | Freq: Every day | ORAL | Status: DC
  Administered 2012-03-03 – 2012-03-05 (×3): 40 mg via ORAL
  Filled 2012-03-03 (×3): qty 1

## 2012-03-03 MED ORDER — ONDANSETRON HCL 4 MG PO TABS
4.0000 mg | ORAL_TABLET | Freq: Four times a day (QID) | ORAL | Status: DC | PRN
  Administered 2012-03-05: 4 mg via ORAL
  Filled 2012-03-03: qty 1

## 2012-03-03 MED ORDER — TRAMADOL HCL 50 MG PO TABS
50.0000 mg | ORAL_TABLET | Freq: Four times a day (QID) | ORAL | Status: DC
  Filled 2012-03-03 (×5): qty 1

## 2012-03-03 MED ORDER — ONDANSETRON HCL 4 MG/2ML IJ SOLN: 4.0000 mg | Freq: Four times a day (QID) | INTRAMUSCULAR | Status: DC | PRN

## 2012-03-03 MED ORDER — LOSARTAN POTASSIUM 50 MG PO TABS
50.0000 mg | ORAL_TABLET | Freq: Every day | ORAL | Status: DC
  Administered 2012-03-03 – 2012-03-04 (×2): 50 mg via ORAL
  Filled 2012-03-03 (×2): qty 1

## 2012-03-03 MED ORDER — ENOXAPARIN SODIUM 40 MG/0.4ML ~~LOC~~ SOLN: 40.0000 mg | SUBCUTANEOUS | Status: DC

## 2012-03-03 MED ORDER — VITAMINS A & D EX OINT
TOPICAL_OINTMENT | CUTANEOUS | Status: AC
  Administered 2012-03-03: 11:00:00
  Filled 2012-03-03: qty 5

## 2012-03-03 MED ORDER — ALBUTEROL SULFATE (5 MG/ML) 0.5% IN NEBU: 2.5000 mg | INHALATION_SOLUTION | RESPIRATORY_TRACT | Status: DC | PRN

## 2012-03-03 MED ORDER — BUDESONIDE-FORMOTEROL FUMARATE 80-4.5 MCG/ACT IN AERO
2.0000 | INHALATION_SPRAY | Freq: Two times a day (BID) | RESPIRATORY_TRACT | Status: DC
  Administered 2012-03-03 – 2012-03-05 (×6): 2 via RESPIRATORY_TRACT
  Filled 2012-03-03: qty 6.9

## 2012-03-03 MED ORDER — HYDROXYCHLOROQUINE SULFATE 200 MG PO TABS
200.0000 mg | ORAL_TABLET | Freq: Every day | ORAL | Status: DC
  Administered 2012-03-03 – 2012-03-05 (×3): 200 mg via ORAL
  Filled 2012-03-03 (×3): qty 1

## 2012-03-03 MED ORDER — BENZONATATE 100 MG PO CAPS
100.0000 mg | ORAL_CAPSULE | Freq: Three times a day (TID) | ORAL | Status: DC | PRN
  Filled 2012-03-03: qty 1

## 2012-03-03 MED ORDER — INSULIN ASPART 100 UNIT/ML ~~LOC~~ SOLN
0.0000 [IU] | Freq: Three times a day (TID) | SUBCUTANEOUS | Status: DC
  Administered 2012-03-04: 2 [IU] via SUBCUTANEOUS

## 2012-03-03 MED ORDER — METOPROLOL SUCCINATE ER 50 MG PO TB24
50.0000 mg | ORAL_TABLET | Freq: Every day | ORAL | Status: DC
  Administered 2012-03-03 – 2012-03-04 (×2): 50 mg via ORAL
  Filled 2012-03-03 (×2): qty 1

## 2012-03-03 MED ORDER — DIPHENHYDRAMINE HCL 25 MG PO CAPS
25.0000 mg | ORAL_CAPSULE | Freq: Every day | ORAL | Status: DC | PRN
  Administered 2012-03-03 – 2012-03-05 (×4): 25 mg via ORAL
  Filled 2012-03-03 (×4): qty 1

## 2012-03-03 MED ORDER — IPRATROPIUM BROMIDE 0.02 % IN SOLN: 0.5000 mg | RESPIRATORY_TRACT | Status: DC | PRN

## 2012-03-03 MED ORDER — SENNOSIDES-DOCUSATE SODIUM 8.6-50 MG PO TABS
1.0000 | ORAL_TABLET | Freq: Every evening | ORAL | Status: DC | PRN
  Administered 2012-03-03: 1 via ORAL
  Filled 2012-03-03: qty 1

## 2012-03-03 MED ORDER — ACYCLOVIR 200 MG PO CAPS
200.0000 mg | ORAL_CAPSULE | Freq: Every day | ORAL | Status: DC
  Administered 2012-03-03 – 2012-03-05 (×3): 200 mg via ORAL
  Filled 2012-03-03 (×3): qty 1

## 2012-03-03 MED ORDER — INSULIN ASPART 100 UNIT/ML ~~LOC~~ SOLN: 0.0000 [IU] | Freq: Every day | SUBCUTANEOUS | Status: DC

## 2012-03-03 NOTE — Progress Notes (Signed)
Rx:  Brief Lovenox note  Wt= 109kg, CrCl = 108 ml/min  Adjusted Lovenox to 0.5 mg/kg for DVT prophylaxis in pt with BMI >30.  [name] 03/03/2012 12:35 AM

## 2012-03-03 NOTE — Care Management Note (Unsigned)
    Page 1 of 1   03/03/2012     12:33:42 PM   CARE MANAGEMENT NOTE 03/03/2012  Patient:  [name], [name]   Account Number:  [id number]  Date Initiated:  03/03/2012  Documentation initiated by:  Lanier Clam  Subjective/Objective Assessment:   ADMITTED W/SOB.     Action/Plan:   FROM HOME.HAS PCP,PHARMACY.   Anticipated DC Date:  03/07/2012   Anticipated DC Plan:  HOME/SELF CARE      DC Planning Services  CM consult      Choice offered to / List presented to:             Status of service:  In process, will continue to follow Medicare Important Message given?   (If response is "NO", the following Medicare IM given date fields will be blank) Date Medicare IM given:   Date Additional Medicare IM given:    Discharge Disposition:    Per UR Regulation:  Reviewed for med. necessity/level of care/duration of stay  If discussed at Long Length of Stay Meetings, dates discussed:    Comments:  03/03/12 [place] Coleta Grosshans RN,BSN NCM [phone]

## 2012-03-03 NOTE — Progress Notes (Signed)
Patient ID: [name]  female  WGN:[id number]    DOB: 1970-07-24    DOA: 03/02/2012  PCP: Elie Confer, MD  Assessment/Plan: Active Problems:  Acute on chronic CHF, diastolic: Likely secondary to noncompliance with medication and dietary indiscretions (per brother in the room, she does not follow low salt diet, eating a lot, does not exercise) -  Continue IV Lasix, daily weights, I.'s and O.'s - recheck 2-D echocardiogram - Per pt, last stress test approximately one year ago was normal and last heart cath 2 years ago was normal   Rheumatoid arthritis/ Lupus - Continue pain medication, Flexeril, Robaxin   HTN (hypertension): Continue Avapro, BB, Lasix   GERD (gastroesophageal reflux disease) - cont PPI   Morbid obesity,BMI of 45.0-49.9, adult - check HbA1c, TSH, Lipid Panel - counseled strongly on diet and lifestyle modifications  Diabetes mellitus - Obtain hemoglobin A1c, continue sliding-scale insulin  Hypokalemia: Likely secondary to diuresis, replaced  DVT Prophylaxis: Lovenox  Code Status: Full code  Disposition: Hopefully tomorrow if improving    Subjective: Feels somewhat better today, no chest pain, sitting upright but no significant SOB, no nause,a no vomiting  Objective: Weight change:   Intake/Output Summary (Last 24 hours) at 03/03/12 1119 Last data filed at 03/03/12 0700  Gross per 24 hour  Intake    200 ml  Output   4000 ml  Net  -3800 ml   Blood pressure 151/83, pulse 101, temperature 93 F (33.9 C), temperature source Oral, resp. rate 16, height 5\' 1"  (1.549 m), weight 109.9 kg (242 lb 4.6 oz), last menstrual period 01/28/2012, SpO2 95.00%.  Physical Exam: General: Alert and awake, oriented x3, not in any acute distress. HEENT: anicteric sclera, PERLA, EOMI CVS: S1-S2 clear, no murmur rubs or gallops Chest: Bibasilar crackles Abdomen: Morbidly obese, soft nontender, nondistended, normal bowel sounds Extremities: no cyanosis,  clubbing. Trace edema noted bilaterally Neuro: Cranial nerves II-XII intact, no focal neurological deficits  Lab Results: Basic Metabolic Panel:  Lab 03/03/12 7846 03/02/12 1920  NA 140 139  K 2.7* 3.3*  CL 99 102  CO2 29 24  GLUCOSE 103* 94  BUN 9 9  CREATININE 0.65 0.63  CALCIUM 9.6 9.0  MG 1.5 --  PHOS -- --   CBC:  Lab 03/03/12 0635 03/02/12 1920  WBC 7.7 8.5  NEUTROABS -- 4.9  HGB 10.5* 9.4*  HCT 32.6* 30.0*  MCV 81.3 82.2  PLT 428* 397   Cardiac Enzymes:  Lab 03/03/12 0635 03/03/12 0121  CKTOTAL -- --  CKMB -- --  CKMBINDEX -- --  TROPONINI <0.30 <0.30   BNP: No components found with this basename: POCBNP:2 CBG:  Lab 03/03/12 0744 03/03/12 0015  GLUCAP 118* 119*     Micro Results: Recent Results (from the past 240 hour(s))  MRSA PCR SCREENING     Status: Normal   Collection Time   03/03/12 12:57 AM      Component Value Range Status Comment   MRSA by PCR NEGATIVE  NEGATIVE Final     Studies/Results: Dg Chest 2 View  03/02/2012  *RADIOLOGY REPORT*  Clinical Data: Shortness of breath and cough.  CHEST - 2 VIEW  Comparison: 03/02/2012  Findings: Cardiomegaly noted with bilateral interstitial opacity. No overt airspace opacity.  No pleural effusion identified.  IMPRESSION:  1.  Cardiomegaly with bilateral interstitial accentuation favoring interstitial edema in the setting of congestive heart failure. Atypical pneumonia is a differential diagnostic consideration.   Original Report Authenticated By: Gaylyn Rong, M.D.  Dg Chest 2 View  03/02/2012  *RADIOLOGY REPORT*  Clinical Data: Shortness of breath.  CHEST - 2 VIEW  Comparison: 05/04/2011.  Findings: The heart is mildly enlarged but stable.  There is central vascular congestion, peribronchial thickening and increased interstitial markings suggesting vascular congestion and probable mild interstitial edema.  No definite pleural effusion.  IMPRESSION: Cardiac enlargement and mild CHF.   Original  Report Authenticated By: Rudie Meyer, M.D.     Medications: Scheduled Meds:   . acyclovir  200 mg Oral Daily  . budesonide-formoterol  2 puff Inhalation BID  . enoxaparin (LOVENOX) injection  50 mg Subcutaneous Q24H  . folic acid  1 mg Oral Daily  . furosemide  40 mg Intravenous Q12H  . [COMPLETED] furosemide  60 mg Intravenous Once  . hydroxychloroquine  200 mg Oral Daily  . insulin aspart  0-15 Units Subcutaneous TID WC  . insulin aspart  0-5 Units Subcutaneous QHS  . losartan  50 mg Oral Daily  . metFORMIN  500 mg Oral BID WC  . metoprolol succinate  50 mg Oral Daily  . pantoprazole  40 mg Oral Daily  . [COMPLETED] potassium chloride  40 mEq Oral Once  . [COMPLETED] potassium chloride  40 mEq Oral Once  . sodium chloride  3 mL Intravenous Q12H  . traMADol  50 mg Oral Q6H  . [COMPLETED] vitamin A & D      . [DISCONTINUED] enoxaparin (LOVENOX) injection  40 mg Subcutaneous Q24H  . [DISCONTINUED] traMADol  50 mg Oral Q6H      LOS: 1 day   RAI,RIPUDEEP M.D. [facility] 03/03/2012, 11:19 AM Pager: [phone]  If 7PM-7AM, please contact night-coverage [email or web address] Password [email or web address]

## 2012-03-03 NOTE — Clinical Documentation Improvement (Signed)
CHF DOCUMENTATION CLARIFICATION QUERY  THIS DOCUMENT IS NOT A PERMANENT PART OF THE MEDICAL RECORD  TO RESPOND TO THE THIS QUERY, FOLLOW THE INSTRUCTIONS BELOW:  1. If needed, update documentation for the patient's encounter via the notes activity.  2. Access this query again and click edit on the In [employer].  3. After updating, or not, click F2 to complete all highlighted (required) fields concerning your review. Select "additional documentation in the medical record" OR "no additional documentation provided".  4. Click Sign note button.  5. The deficiency will fall out of your In Basket *Please let us know if you are not able to complete this workflow by phone or e-mail (listed below).  Please update your documentation within the medical record to reflect your response to this query.                                                                                    03/03/12  Dear Dr. Isidoro Donning, [facility],  In a better effort to capture your patient's severity of illness, reflect appropriate length of stay and utilization of resources, a review of the patient medical record has revealed the following indicators the diagnosis of Heart Failure.    Based on your clinical judgment, please clarify and document in a progress note and/or discharge summary the clinical condition associated with the following supporting information:  In responding to this query please exercise your independent judgment.  The fact that a query is asked, does not imply that any particular answer is desired or expected.  Pt with documented CHF. Please clarify the acuity and type of CHF and document in pn and d/c summary.    Possible Clinical Conditions?   Chronic Systolic Congestive Heart Failure Chronic Diastolic Congestive Heart Failure Chronic Systolic & Diastolic Congestive Heart Failure Acute Systolic Congestive Heart Failure Acute Diastolic Congestive Heart Failure Acute Systolic & Diastolic  Congestive Heart Failure Acute on Chronic Systolic Congestive Heart Failure Acute on Chronic Diastolic Congestive Heart Failure Acute on Chronic Systolic & Diastolic  Congestive Heart Failure Other Condition________________________________________ Cannot Clinically Determine  Supporting Information:  Risk Factors CHF, Morbid Obesity  Signs & Symptoms:  cough and shortness of breath, Cardiomegaly  Diagnostics: Component     Latest Ref Rng 03/02/2012          Pro B Natriuretic peptide (BNP)     0 - 125 pg/mL 2197.0 (H)   Component     Latest Ref Rng 03/03/2012         6:35 AM  Pro B Natriuretic peptide (BNP)     0 - 125 pg/mL 2632.0 (H)   CXR 03/02/12 IMPRESSION:  1. Cardiomegaly with bilateral interstitial accentuation favoring  interstitial edema in the setting of congestive heart failure  Treatment: Monitoring: predniSONE (DELTASONE metoprolol succinate (TOPROL-XL) 50 MG COZAAR PROVENTIL    Reviewed: {Reviewed:please refer to my progress note, thanks Thank You,  Enis Slipper  RN, BSN, MSN/Inf, CCDS Clinical Documentation Specialist [facility] Pager: [phone] / E-mail: [email or web address]   Health Information Management [place]

## 2012-03-03 NOTE — Progress Notes (Signed)
  Echocardiogram 2D Echocardiogram has been performed.  [name] 03/03/2012, 1:37 PM

## 2012-03-04 LAB — BASIC METABOLIC PANEL
CO2: 27 mEq/L (ref 19–32)
Chloride: 99 mEq/L (ref 96–112)
Creatinine, Ser: 0.54 mg/dL (ref 0.50–1.10)
GFR calc Af Amer: >90 mL/min (ref >90)
Potassium: 3.5 mEq/L (ref 3.5–5.1)

## 2012-03-04 LAB — GLUCOSE, CAPILLARY: Glucose-Capillary: 116 mg/dL — ABNORMAL HIGH (ref 70–99)

## 2012-03-04 MED ORDER — METOPROLOL SUCCINATE ER 25 MG PO TB24
25.0000 mg | ORAL_TABLET | Freq: Every day | ORAL | Status: DC
  Administered 2012-03-05: 25 mg via ORAL
  Filled 2012-03-04: qty 1

## 2012-03-04 MED ORDER — LOSARTAN POTASSIUM 25 MG PO TABS
25.0000 mg | ORAL_TABLET | Freq: Every day | ORAL | Status: DC
  Administered 2012-03-05: 25 mg via ORAL
  Filled 2012-03-04 (×2): qty 1

## 2012-03-04 MED ORDER — BISACODYL 10 MG RE SUPP
10.0000 mg | Freq: Once | RECTAL | Status: DC
  Filled 2012-03-04: qty 1

## 2012-03-04 MED ORDER — SENNOSIDES-DOCUSATE SODIUM 8.6-50 MG PO TABS
2.0000 | ORAL_TABLET | Freq: Once | ORAL | Status: AC
  Administered 2012-03-04: 2 via ORAL
  Filled 2012-03-04: qty 2

## 2012-03-04 NOTE — Progress Notes (Signed)
Patient ID: [name]  female  ZOX:[id number]    DOB: 01-17-1971    DOA: 03/02/2012  PCP: Elie Confer, MD  Assessment/Plan: Active Problems:  Acute on chronic CHF, diastolic: Likely secondary to noncompliance with medication and dietary indiscretions (per brother in the room, she does not follow low salt diet, eating a lot, does not exercise), down 4 pounds during this hospitalization  -  Continue IV Lasix, daily weights, I.'s and O.'s - 2-D echo done, results pending - Per pt, last stress test approximately one year ago was normal and last heart cath 2 years ago was normal   Rheumatoid arthritis/ Lupus - Continue pain medication, Flexeril, Robaxin   HTN (hypertension): BP on softer side - Decrease Avapro, BB, continue Lasix   GERD (gastroesophageal reflux disease) - cont PPI   Morbid obesity,BMI of 45.0-49.9, adult - Hemoglobin A1c 6.0, TSH 2.9, LDL 76 - counseled strongly on diet and lifestyle modifications  Diabetes mellitus: Hemoglobin A1c 6.0 -  continue sliding-scale insulin  Constipation: Added suppository and Senokot-S  DVT Prophylaxis: Lovenox  Code Status: Full code  Disposition: Hopefully tomorrow if improving    Subjective: Feels better, however slightly dizzy this morning. Feels constipated, no chest pain, shortness of breath or nausea, vomiting, tolerating solid diet  Objective: Weight change: -1.717 kg (-3 lb 12.6 oz)  Intake/Output Summary (Last 24 hours) at 03/04/12 1111 Last data filed at 03/04/12 0654  Gross per 24 hour  Intake      4 ml  Output   3075 ml  Net  -3071 ml   Blood pressure 108/72, pulse 92, temperature 97.8 F (36.6 C), temperature source Oral, resp. rate 16, height 5\' 1"  (1.549 m), weight 108.183 kg (238 lb 8 oz), last menstrual period 01/28/2012, SpO2 97.00%.  Physical Exam: General: Alert and awake, oriented x3, not in any acute distress. HEENT: anicteric sclera, PERLA, EOMI CVS: S1-S2 clear, no murmur rubs or  gallops Chest: Bibasilar crackles improved, fairly clear today Abdomen: Morbidly obese, soft nontender, nondistended, normal bowel sounds Extremities: no cyanosis, clubbing. Trace edema noted bilaterally   Lab Results: Basic Metabolic Panel:  Lab 03/04/12 8119 03/03/12 1215 03/03/12 0635  NA 138 -- 140  K 3.5 3.2* --  CL 99 -- 99  CO2 27 -- 29  GLUCOSE 103* -- 103*  BUN 11 -- 9  CREATININE 0.54 -- 0.65  CALCIUM 9.5 -- 9.6  MG -- -- 1.5  PHOS -- -- --   CBC:  Lab 03/03/12 0635 03/02/12 1920  WBC 7.7 8.5  NEUTROABS -- 4.9  HGB 10.5* 9.4*  HCT 32.6* 30.0*  MCV 81.3 82.2  PLT 428* 397   Cardiac Enzymes:  Lab 03/03/12 1215 03/03/12 0635 03/03/12 0121  CKTOTAL -- -- --  CKMB -- -- --  CKMBINDEX -- -- --  TROPONINI <0.30 <0.30 <0.30   BNP: No components found with this basename: POCBNP:2 CBG:  Lab 03/04/12 0720 03/03/12 2110 03/03/12 1648 03/03/12 1156 03/03/12 0744  GLUCAP 116* 134* 90 81 118*     Micro Results: Recent Results (from the past 240 hour(s))  MRSA PCR SCREENING     Status: Normal   Collection Time   03/03/12 12:57 AM      Component Value Range Status Comment   MRSA by PCR NEGATIVE  NEGATIVE Final     Studies/Results: Dg Chest 2 View  03/02/2012  *RADIOLOGY REPORT*  Clinical Data: Shortness of breath and cough.  CHEST - 2 VIEW  Comparison: 03/02/2012  Findings:  Cardiomegaly noted with bilateral interstitial opacity. No overt airspace opacity.  No pleural effusion identified.  IMPRESSION:  1.  Cardiomegaly with bilateral interstitial accentuation favoring interstitial edema in the setting of congestive heart failure. Atypical pneumonia is a differential diagnostic consideration.   Original Report Authenticated By: Gaylyn Rong, M.D.    Dg Chest 2 View  03/02/2012  *RADIOLOGY REPORT*  Clinical Data: Shortness of breath.  CHEST - 2 VIEW  Comparison: 05/04/2011.  Findings: The heart is mildly enlarged but stable.  There is central vascular  congestion, peribronchial thickening and increased interstitial markings suggesting vascular congestion and probable mild interstitial edema.  No definite pleural effusion.  IMPRESSION: Cardiac enlargement and mild CHF.   Original Report Authenticated By: Rudie Meyer, M.D.     Medications: Scheduled Meds:    . acyclovir  200 mg Oral Daily  . bisacodyl  10 mg Rectal Once  . budesonide-formoterol  2 puff Inhalation BID  . enoxaparin (LOVENOX) injection  50 mg Subcutaneous Q24H  . folic acid  1 mg Oral Daily  . furosemide  40 mg Intravenous Q12H  . hydroxychloroquine  200 mg Oral Daily  . insulin aspart  0-15 Units Subcutaneous TID WC  . insulin aspart  0-5 Units Subcutaneous QHS  . losartan  25 mg Oral Daily  . metFORMIN  500 mg Oral BID WC  . metoprolol succinate  25 mg Oral Daily  . pantoprazole  40 mg Oral Daily  . senna-docusate  2 tablet Oral Once  . sodium chloride  3 mL Intravenous Q12H  . traMADol  50 mg Oral Q6H  . [DISCONTINUED] losartan  50 mg Oral Daily  . [DISCONTINUED] metoprolol succinate  50 mg Oral Daily      LOS: 2 days   RAI,RIPUDEEP M.D. [facility] 03/04/2012, 11:11 AM Pager: [phone]  If 7PM-7AM, please contact night-coverage [email or web address] Password [email or web address]

## 2012-03-05 ENCOUNTER — Emergency Department (HOSPITAL_COMMUNITY)
Admission: EM | Admit: 2012-03-05 | Discharge: 2012-03-06 | Disposition: A | Discharge status: Home / Self Care | Payer: Medicare Other | Attending: Emergency Medicine | Admitting: Emergency Medicine

## 2012-03-05 ENCOUNTER — Other Ambulatory Visit: Payer: Self-pay

## 2012-03-05 ENCOUNTER — Encounter (HOSPITAL_COMMUNITY): Payer: Self-pay | Admitting: Emergency Medicine

## 2012-03-05 DIAGNOSIS — Z872 Personal history of diseases of the skin and subcutaneous tissue: Secondary | ICD-10-CM | POA: Insufficient documentation

## 2012-03-05 DIAGNOSIS — M069 Rheumatoid arthritis, unspecified: Secondary | ICD-10-CM | POA: Insufficient documentation

## 2012-03-05 DIAGNOSIS — E669 Obesity, unspecified: Secondary | ICD-10-CM | POA: Insufficient documentation

## 2012-03-05 DIAGNOSIS — R42 Dizziness and giddiness: Secondary | ICD-10-CM

## 2012-03-05 DIAGNOSIS — R51 Headache: Secondary | ICD-10-CM | POA: Insufficient documentation

## 2012-03-05 DIAGNOSIS — M329 Systemic lupus erythematosus, unspecified: Secondary | ICD-10-CM | POA: Insufficient documentation

## 2012-03-05 DIAGNOSIS — I10 Essential (primary) hypertension: Secondary | ICD-10-CM | POA: Insufficient documentation

## 2012-03-05 DIAGNOSIS — Z79899 Other long term (current) drug therapy: Secondary | ICD-10-CM | POA: Insufficient documentation

## 2012-03-05 LAB — BASIC METABOLIC PANEL
BUN: 19 mg/dL (ref 6–23)
CO2: 26 mEq/L (ref 19–32)
Calcium: 9.3 mg/dL (ref 8.4–10.5)
Creatinine, Ser: 0.68 mg/dL (ref 0.50–1.10)
GFR calc non Af Amer: >90 mL/min (ref >90)
Glucose, Bld: 110 mg/dL — ABNORMAL HIGH (ref 70–99)
Sodium: 134 mEq/L — ABNORMAL LOW (ref 135–145)

## 2012-03-05 LAB — GLUCOSE, CAPILLARY: Glucose-Capillary: 105 mg/dL — ABNORMAL HIGH (ref 70–99)

## 2012-03-05 MED ORDER — FUROSEMIDE 40 MG PO TABS
40.0000 mg | ORAL_TABLET | Freq: Two times a day (BID) | ORAL | Status: DC
  Administered 2012-03-05: 40 mg via ORAL
  Filled 2012-03-05 (×3): qty 1

## 2012-03-05 MED ORDER — ONDANSETRON HCL 4 MG/2ML IJ SOLN
4.0000 mg | Freq: Once | INTRAMUSCULAR | Status: AC
  Administered 2012-03-05: 4 mg via INTRAVENOUS
  Filled 2012-03-05: qty 2

## 2012-03-05 MED ORDER — TRAMADOL HCL 50 MG PO TABS
50.0000 mg | ORAL_TABLET | Freq: Four times a day (QID) | ORAL | Status: DC
Start: 2012-03-05 — End: 2019-05-15

## 2012-03-05 MED ORDER — METOPROLOL SUCCINATE ER 25 MG PO TB24: 25.0000 mg | ORAL_TABLET | Freq: Every day | ORAL | Status: DC

## 2012-03-05 MED ORDER — ASPIRIN 81 MG PO CHEW
81.0000 mg | CHEWABLE_TABLET | Freq: Every day | ORAL | Status: DC
  Administered 2012-03-05: 81 mg via ORAL
  Filled 2012-03-05: qty 1

## 2012-03-05 MED ORDER — LOSARTAN POTASSIUM 50 MG PO TABS: 25.0000 mg | ORAL_TABLET | Freq: Every day | ORAL | Status: DC

## 2012-03-05 MED ORDER — FUROSEMIDE 40 MG PO TABS: 40.0000 mg | ORAL_TABLET | Freq: Two times a day (BID) | ORAL | Status: DC

## 2012-03-05 MED ORDER — ASPIRIN 81 MG PO CHEW: 81.0000 mg | CHEWABLE_TABLET | Freq: Every day | ORAL | Status: DC

## 2012-03-05 MED ORDER — MECLIZINE HCL 25 MG PO TABS
25.0000 mg | ORAL_TABLET | Freq: Once | ORAL | Status: AC
  Administered 2012-03-05: 25 mg via ORAL
  Filled 2012-03-05: qty 1

## 2012-03-05 NOTE — Consult Note (Signed)
[name] is an 41 y.o. female.   Chief Complaint: Shortness of breath and leg edema. HPI: 41 years old female with dietary non-compliance has shortness of breath and leg edema with non-ischemic dilated cardiomyopathy. She is treated with lasix. No fever. Cardiac cath 2 years ago was unremarkable with normal coronaries. Echocardiogram has shown further decline in systolic function. Past Medical History  Diagnosis Date  . Hypertension   . Rheumatoid arthritis   . Herpes simplex of female genitalia   . Cardiomyopathy   . Lupus       Past Surgical History  Procedure Date  . Dental surgery     Family History  Problem Relation Age of Onset  . Hypertension    . Diabetes Mellitus II     Social History:  reports that she has never smoked. She has never used smokeless tobacco. She reports that she does not drink alcohol or use illicit drugs.  Allergies:  Allergies  Allergen Reactions  . Clarithromycin     REACTION: upset stomach  . Hydrocodone     REACTION: nausea  . Penicillins Swelling    Medications Prior to Admission  Medication Sig Dispense Refill  . acyclovir (ZOVIRAX) 200 MG capsule Take 200 mg by mouth daily.       Marland Kitchen albuterol (PROVENTIL HFA;VENTOLIN HFA) 108 (90 BASE) MCG/ACT inhaler Inhale 2 puffs into the lungs every 6 (six) hours as needed. Shortness of breath      . benzonatate (TESSALON) 100 MG capsule Take 100 mg by mouth 3 (three) times daily as needed. cough      . budesonide-formoterol (SYMBICORT) 80-4.5 MCG/ACT inhaler Inhale 2 puffs into the lungs 2 (two) times daily.       . cyclobenzaprine (FLEXERIL) 10 MG tablet Take 10 mg by mouth 3 (three) times daily as needed. Muscle spasms      . diphenhydrAMINE (SOMINEX) 25 MG tablet Take 25 mg by mouth at bedtime as needed. allergies      . esomeprazole (NEXIUM) 40 MG capsule Take 40 mg by mouth daily before breakfast.      . folic acid (FOLVITE) 1 MG tablet Take 1 mg by mouth daily.      . hydroxychloroquine  (PLAQUENIL) 200 MG tablet Take 200 mg by mouth daily.       . metFORMIN (GLUCOPHAGE) 500 MG tablet Take 500 mg by mouth 2 (two) times daily with a meal.      . methocarbamol (ROBAXIN) 500 MG tablet Take 500 mg by mouth 2 (two) times daily as needed. Muscle spasms      . [DISCONTINUED] ibuprofen (ADVIL,MOTRIN) 600 MG tablet Take 600 mg by mouth every 6 (six) hours as needed. pain      . [DISCONTINUED] losartan (COZAAR) 50 MG tablet Take 50 mg by mouth daily.      . [DISCONTINUED] metoprolol succinate (TOPROL-XL) 50 MG 24 hr tablet Take 50 mg by mouth daily. Take with or immediately following a meal.      . [DISCONTINUED] traMADol (ULTRAM) 50 MG tablet Take 50 mg by mouth every 6 (six) hours as needed. pain        Results for orders placed during the hospital encounter of 03/02/12 (from the past 48 hour(s))  GLUCOSE, CAPILLARY     Status: Normal   Collection Time   03/03/12 11:56 AM      Component Value Range Comment   Glucose-Capillary 81  70 - 99 mg/dL   TROPONIN I  Status: Normal   Collection Time   03/03/12 12:15 PM      Component Value Range Comment   Troponin I <0.30  <0.30 ng/mL   POTASSIUM     Status: Abnormal   Collection Time   03/03/12 12:15 PM      Component Value Range Comment   Potassium 3.2 (*) 3.5 - 5.1 mEq/L   TSH     Status: Normal   Collection Time   03/03/12 12:15 PM      Component Value Range Comment   TSH 2.943  0.350 - 4.500 uIU/mL   HEMOGLOBIN A1C     Status: Abnormal   Collection Time   03/03/12 12:15 PM      Component Value Range Comment   Hemoglobin A1C 6.0 (*) <5.7 %    Mean Plasma Glucose 126 (*) <117 mg/dL   LIPID PANEL     Status: Normal   Collection Time   03/03/12 12:15 PM      Component Value Range Comment   Cholesterol 139  0 - 200 mg/dL    Triglycerides 161  <096 mg/dL    HDL 40  >04 mg/dL    Total CHOL/HDL Ratio 3.5      VLDL 23  0 - 40 mg/dL    LDL Cholesterol 76  0 - 99 mg/dL   GLUCOSE, CAPILLARY     Status: Normal   Collection  Time   03/03/12  4:48 PM      Component Value Range Comment   Glucose-Capillary 90  70 - 99 mg/dL   GLUCOSE, CAPILLARY     Status: Abnormal   Collection Time   03/03/12  9:10 PM      Component Value Range Comment   Glucose-Capillary 134 (*) 70 - 99 mg/dL    Comment 1 Documented in Chart      Comment 2 Notify RN     BASIC METABOLIC PANEL     Status: Abnormal   Collection Time   03/04/12  5:20 AM      Component Value Range Comment   Sodium 138  135 - 145 mEq/L    Potassium 3.5  3.5 - 5.1 mEq/L    Chloride 99  96 - 112 mEq/L    CO2 27  19 - 32 mEq/L    Glucose, Bld 103 (*) 70 - 99 mg/dL    BUN 11  6 - 23 mg/dL    Creatinine, Ser 5.40  0.50 - 1.10 mg/dL    Calcium 9.5  8.4 - 98.1 mg/dL    GFR calc non Af Amer >90  >90 mL/min    GFR calc Af Amer >90  >90 mL/min   GLUCOSE, CAPILLARY     Status: Abnormal   Collection Time   03/04/12  7:20 AM      Component Value Range Comment   Glucose-Capillary 116 (*) 70 - 99 mg/dL    Comment 1 Notify RN      Comment 2 Documented in Chart     GLUCOSE, CAPILLARY     Status: Abnormal   Collection Time   03/04/12  1:06 PM      Component Value Range Comment   Glucose-Capillary 131 (*) 70 - 99 mg/dL   GLUCOSE, CAPILLARY     Status: Normal   Collection Time   03/04/12  5:15 PM      Component Value Range Comment   Glucose-Capillary 91  70 - 99 mg/dL    Comment 1 Notify RN  Comment 2 Documented in Chart     GLUCOSE, CAPILLARY     Status: Abnormal   Collection Time   03/04/12  8:49 PM      Component Value Range Comment   Glucose-Capillary 135 (*) 70 - 99 mg/dL    Comment 1 Documented in Chart      Comment 2 Notify RN     BASIC METABOLIC PANEL     Status: Abnormal   Collection Time   03/05/12  4:47 AM      Component Value Range Comment   Sodium 134 (*) 135 - 145 mEq/L    Potassium 3.5  3.5 - 5.1 mEq/L    Chloride 96  96 - 112 mEq/L    CO2 26  19 - 32 mEq/L    Glucose, Bld 110 (*) 70 - 99 mg/dL    BUN 19  6 - 23 mg/dL    Creatinine,  Ser 1.61  0.50 - 1.10 mg/dL    Calcium 9.3  8.4 - 09.6 mg/dL    GFR calc non Af Amer >90  >90 mL/min    GFR calc Af Amer >90  >90 mL/min   GLUCOSE, CAPILLARY     Status: Abnormal   Collection Time   03/05/12  7:44 AM      Component Value Range Comment   Glucose-Capillary 105 (*) 70 - 99 mg/dL    No results found.  @ROS @ + weight gain, No hearing or vision loss. + Chest pain, No hemoptysis, + arthritis, + diabetes, No stroke, seizures or psych admissions.  Physical Exam: Blood pressure 117/73, pulse 90, temperature 98.4 F (36.9 C), temperature source Oral, resp. rate 17, height 5\' 1"  (1.549 m), weight 108.455 kg (239 lb 1.6 oz), last menstrual period 01/28/2012, SpO2 96.00%.  General: Alert and oriented times three, obese, no acute distress  Eyes: Brown eyes, PERLA, pink conjunctiva, no scleral icterus  ENT: Moist oral mucosa,  Neck: supple, no thyromegaly, no JVD at 90 degree. Lungs: bibasilar crackles, no use of accessory muscles  Cardiovascular: regular rate and rhythm, II/VI systolic murmurs.  Abdomen: soft, positive BS, non-tender, non-distended, no organomegaly. Neuro: CN II - XII grossly intact, sensation intact. Moves all 4 ext. Musculoskeletal: strength 5/5 all extremities, no clubbing, cyanosis or edema  Skin: no rash, warm and dry.  Psych: appropriate affect.  Assessment/Plan Acute on chronic left heart systolic failure. Non ischemic dilated cardiomyopathy DM, II Rheumatoid arthritis HTN Dietary non-compliance Obesity  She has improved with lasix use. She was instructed to decrease food intake and fluid intake by about half. F/U in 1 week. Agree with current lasix,  small dose B-blocker and ARB use. Dietary consult for DM, II and obesity.  Janice Seales S 03/05/2012, 9:54 AM

## 2012-03-05 NOTE — Discharge Summary (Signed)
Physician Discharge Summary  Patient ID: [name] MRN: [id number] DOB/AGE: 10/25/1970 41 y.o.  Admit date: 03/02/2012 Discharge date: 03/05/2012  Primary Care Physician:  Elie Confer, MD  Discharge Diagnoses:    . Acute on chronic CHF, systolic   . Lupus . HTN (hypertension) . GERD (gastroesophageal reflux disease) . Rheumatoid arthritis . Diabetes mellitus . Hypokalemia Morbid Obesity BMI 45.9  Consults:  Cardiology, Dr Algie Coffer   Discharge Medications:   Medication List     As of 03/05/2012 10:30 AM    STOP taking these medications         ibuprofen 600 MG tablet   Commonly known as: ADVIL,MOTRIN      TAKE these medications         acyclovir 200 MG capsule   Commonly known as: ZOVIRAX   Take 200 mg by mouth daily.      albuterol 108 (90 BASE) MCG/ACT inhaler   Commonly known as: PROVENTIL HFA;VENTOLIN HFA   Inhale 2 puffs into the lungs every 6 (six) hours as needed. Shortness of breath      aspirin 81 MG chewable tablet   Chew 1 tablet (81 mg total) by mouth daily.      benzonatate 100 MG capsule   Commonly known as: TESSALON   Take 100 mg by mouth 3 (three) times daily as needed. cough      budesonide-formoterol 80-4.5 MCG/ACT inhaler   Commonly known as: SYMBICORT   Inhale 2 puffs into the lungs 2 (two) times daily.      cyclobenzaprine 10 MG tablet   Commonly known as: FLEXERIL   Take 10 mg by mouth 3 (three) times daily as needed. Muscle spasms      diphenhydrAMINE 25 MG tablet   Commonly known as: SOMINEX   Take 25 mg by mouth at bedtime as needed. allergies      esomeprazole 40 MG capsule   Commonly known as: NEXIUM   Take 40 mg by mouth daily before breakfast.      folic acid 1 MG tablet   Commonly known as: FOLVITE   Take 1 mg by mouth daily.      furosemide 40 MG tablet   Commonly known as: LASIX   Take 1 tablet (40 mg total) by mouth 2 (two) times daily.      hydroxychloroquine 200 MG tablet   Commonly known as:  PLAQUENIL   Take 200 mg by mouth daily.      losartan 50 MG tablet   Commonly known as: COZAAR   Take 0.5 tablets (25 mg total) by mouth daily.      metFORMIN 500 MG tablet   Commonly known as: GLUCOPHAGE   Take 500 mg by mouth 2 (two) times daily with a meal.      methocarbamol 500 MG tablet   Commonly known as: ROBAXIN   Take 500 mg by mouth 2 (two) times daily as needed. Muscle spasms      metoprolol succinate 25 MG 24 hr tablet   Commonly known as: TOPROL-XL   Take 1 tablet (25 mg total) by mouth daily.      traMADol 50 MG tablet   Commonly known as: ULTRAM   Take 1 tablet (50 mg total) by mouth 4 (four) times daily. pain         Brief H and P: For complete details please refer to admission H and P, but in brief This is a 41 year old female who states that approximately 2 weeks  ago she developed a cough and shortness of breath. She thought she had asthma, she used her inhaler with no effect. Approximately 2 days ago she went and saw her PCP who prescribed a Zpack and give her a steroid shot. The patient states she continued to feel worse and therefore she returned to her PCP on the day of admission.  Chest x-ray done revealed congestive heart failure. She was sent to the ER. The patient does have a history of recurrent congestive heart failure, the last episode was approximately 2 years ago.  She states her last heart cath was 2 years ago, it was normal. Her last stress test was approximately one year ago. Patient also reported right sided exertional, chest pain off and on for the last 2 weeks prior to admission.   Hospital Course:  Acute on chronic CHF, systolic: Likely secondary to noncompliance with medication and dietary indiscretions (per her family's report, she does not follow low salt diet, eating a lot and at night, does not exercise). Patient was admitted to telemetry, ruled out for acute ACS, placed on IV Lasix for diuresis. She had good diuresis with Lasix and diuresed  7 L since admission. 2-D echocardiogram was done which showed EF of 30-35% which was considerable drop from her prior EF of 50-55% on cardiac cath in 06/2010. Patient was transitioned over to oral Lasix. Cardiology was consulted and patient wanted to continue to followup with Dr. Algie Coffer. She will followup in one week for labs and may need adjustment in the Lasix dose at that time. Patient was strongly counseled to follow low salt diet, weight loss, exercise and lifestyle modifications.  Rheumatoid arthritis/ Lupus- Continue pain medication, Flexeril, Robaxin. She is not on prednisone anymore.   HTN (hypertension): BP on softer side, Avapro and beta blocker were reduced in the view of diuresis with Lasix   GERD (gastroesophageal reflux disease)- cont PPI   Morbid obesity,BMI of 45.0-49.9, adult: Hemoglobin A1c 6.0, TSH 2.9, LDL 76, counseled strongly on diet control and lifestyle modifications   Day of Discharge BP 117/73  Pulse 90  Temp 98.4 F (36.9 C) (Oral)  Resp 17  Ht 5\' 1"  (1.549 m)  Wt 108.455 kg (239 lb 1.6 oz)  BMI 45.18 kg/m2  SpO2 96%  LMP 01/28/2012  Physical Exam: General: Alert and awake oriented x3 not in any acute distress. HEENT: anicteric sclera, pupils reactive to light and accommodation CVS: S1-S2 clear no murmur rubs or gallops Chest: clear to auscultation bilaterally, no wheezing rales or rhonchi Abdomen: Obesity, soft nontender, nondistended, normal bowel sounds, no organomegaly Extremities: no cyanosis, clubbing or edema noted bilaterally Neuro: Cranial nerves II-XII intact, no focal neurological deficits   The results of significant diagnostics from this hospitalization (including imaging, microbiology, ancillary and laboratory) are listed below for reference.    LAB RESULTS: Basic Metabolic Panel:  Lab 03/05/12 2130 03/04/12 0520 03/03/12 0635  NA 134* 138 --  K 3.5 3.5 --  CL 96 99 --  CO2 26 27 --  GLUCOSE 110* 103* --  BUN 19 11 --  CREATININE  0.68 0.54 --  CALCIUM 9.3 9.5 --  MG -- -- 1.5  PHOS -- -- --   Liver Function Tests: No results found for this basename: AST:2,ALT:2,ALKPHOS:2,BILITOT:2,PROT:2,ALBUMIN:2 in the last 168 hours No results found for this basename: LIPASE:2,AMYLASE:2 in the last 168 hours No results found for this basename: AMMONIA:2 in the last 168 hours CBC:  Lab 03/03/12 0635 03/02/12 1920  WBC 7.7 8.5  NEUTROABS -- 4.9  HGB 10.5* 9.4*  HCT 32.6* 30.0*  MCV 81.3 --  PLT 428* 397   Cardiac Enzymes:  Lab 03/03/12 1215 03/03/12 0635  CKTOTAL -- --  CKMB -- --  CKMBINDEX -- --  TROPONINI <0.30 <0.30   BNP: No components found with this basename: POCBNP:2 CBG:  Lab 03/05/12 0744 03/04/12 2049  GLUCAP 105* 135*    Significant Diagnostic Studies:  Dg Chest 2 View  03/02/2012  *RADIOLOGY REPORT*  Clinical Data: Shortness of breath and cough.  CHEST - 2 VIEW  Comparison: 03/02/2012  Findings: Cardiomegaly noted with bilateral interstitial opacity. No overt airspace opacity.  No pleural effusion identified.  IMPRESSION:  1.  Cardiomegaly with bilateral interstitial accentuation favoring interstitial edema in the setting of congestive heart failure. Atypical pneumonia is a differential diagnostic consideration.   Original Report Authenticated By: Gaylyn Rong, M.D.      Disposition and Follow-up:     Discharge Orders    Future Orders Please Complete By Expires   Diet - low sodium heart healthy      Increase activity slowly      (HEART FAILURE PATIENTS) Call MD:  Anytime you have any of the following symptoms: 1) 3 pound weight gain in 24 hours or 5 pounds in 1 week 2) shortness of breath, with or without a dry hacking cough 3) swelling in the hands, feet or stomach 4) if you have to sleep on extra pillows at night in order to breathe.      Discharge instructions      Comments:   Please watch your diet and fluid intake at home. Weigh yourself weekly and start exercising.        DISPOSITION: Home  DIET: Heart healthy, carb modified diet ACTIVITY: As tolerated   TESTS THAT NEED FOLLOW-UP BMET in 1 week  DISCHARGE FOLLOW-UP Follow-up Information    Follow up with [facility] S, MD. Schedule an appointment as soon as possible for a visit in 1 week. (Needs labs in 1 week)    Contact information:   [address] [place] [address] [phone]       Follow up with Elie Confer, MD. Schedule an appointment as soon as possible for a visit in 10 days. (for hospital follow-up)    Contact information:   [address] [place] [address] [phone]          Time spent on Discharge: 40 mins  Signed:   Nidal Rivet M.D. [facility] 03/05/2012, 10:30 AM Pager: [phone]

## 2012-03-05 NOTE — ED Provider Notes (Signed)
History     CSN: [id number]  Arrival date & time 03/05/12  2215   First MD Initiated Contact with Patient 03/05/12 2232      Chief Complaint  Patient presents with  . Dizziness  . Headache    (Consider location/radiation/quality/duration/timing/severity/associated sxs/prior treatment) HPI  Patient reports she was admitted to the hospital for congestive heart failure. She reports she had echocardiogram done this weekend and was told by a cardiologist today that her ejection fraction was 33%. She states she was discharged today about noon. She states however yesterday while she was in the hospital she felt dizzy and states it's a spinning sensation.  She states she was told there was nothing they could give her for it. She also has a mild frontal headache that is throbbing with nausea but no vomiting. She also feels like she started having ringing in her left ear today. She states movement of her head will make the dizziness worse. She denies numbness or weakness in her arms or legs. She denies any fever. She states she has wheezing and some mild shortness of breath that she had while she was still in the hospital. She denies swelling in her legs and relates that she had a lot of fluid removed while in the hospital which she reports it was about 7 pounds. She states she called her cardiologist Dr. Eliott Nine tonight and was told to return to the hospital for evaluation. She states she was seen by another cardiologist today when she was in the hospital.  PCP Dr Clyde Canterbury Cardiologist Dr Kym Groom  Past Medical History  Diagnosis Date  . Hypertension   . Rheumatoid arthritis   . Herpes simplex of female genitalia   . Cardiomyopathy   . Lupus     Past Surgical History  Procedure Date  . Dental surgery     Family History  Problem Relation Age of Onset  . Hypertension    . Diabetes Mellitus II      History  Substance Use Topics  . Smoking status: Never Smoker   . Smokeless  tobacco: Never Used  . Alcohol Use: No  Lives at home  OB History    Grav Para Term Preterm Abortions TAB SAB Ect Mult Living                  Review of Systems  All other systems reviewed and are negative.    Allergies  Clarithromycin; Hydrocodone; and Penicillins  Home Medications   Current Outpatient Rx  Name  Route  Sig  Dispense  Refill  . ACYCLOVIR 200 MG PO CAPS   Oral   Take 200 mg by mouth daily.          . ALBUTEROL SULFATE HFA 108 (90 BASE) MCG/ACT IN AERS   Inhalation   Inhale 2 puffs into the lungs every 6 (six) hours as needed. Shortness of breath         . ASPIRIN 81 MG PO CHEW   Oral   Chew 1 tablet (81 mg total) by mouth daily.   30 tablet   3   . BENZONATATE 100 MG PO CAPS   Oral   Take 100 mg by mouth 3 (three) times daily as needed. cough         . BUDESONIDE-FORMOTEROL FUMARATE 80-4.5 MCG/ACT IN AERO   Inhalation   Inhale 2 puffs into the lungs 2 (two) times daily.          . CYCLOBENZAPRINE  HCL 10 MG PO TABS   Oral   Take 10 mg by mouth 3 (three) times daily as needed. Muscle spasms         . DIPHENHYDRAMINE HCL (SLEEP) 25 MG PO TABS   Oral   Take 25 mg by mouth at bedtime as needed. allergies         . ESOMEPRAZOLE MAGNESIUM 40 MG PO CPDR   Oral   Take 40 mg by mouth daily before breakfast.         . FOLIC ACID 1 MG PO TABS   Oral   Take 1 mg by mouth daily.         . FUROSEMIDE 40 MG PO TABS   Oral   Take 1 tablet (40 mg total) by mouth 2 (two) times daily.   60 tablet   3   . HYDROXYCHLOROQUINE SULFATE 200 MG PO TABS   Oral   Take 200 mg by mouth daily.          Marland Kitchen LOSARTAN POTASSIUM 50 MG PO TABS   Oral   Take 0.5 tablets (25 mg total) by mouth daily.   30 tablet   3   . METFORMIN HCL 500 MG PO TABS   Oral   Take 500 mg by mouth 2 (two) times daily with a meal.         . METHOCARBAMOL 500 MG PO TABS   Oral   Take 500 mg by mouth 2 (two) times daily as needed. Muscle spasms         .  METOPROLOL SUCCINATE ER 25 MG PO TB24   Oral   Take 1 tablet (25 mg total) by mouth daily.   30 tablet   3   . TRAMADOL HCL 50 MG PO TABS   Oral   Take 1 tablet (50 mg total) by mouth 4 (four) times daily. pain   60 tablet   1     BP 104/87  Pulse 92  Temp 98.7 F (37.1 C) (Oral)  Resp 20  SpO2 98%  LMP 01/28/2012  Vital signs normal    Physical Exam  Nursing note and vitals reviewed. Constitutional: She is oriented to person, place, and time. She appears well-developed and well-nourished.  Non-toxic appearance. She does not appear ill. No distress.       obese  HENT:  Head: Normocephalic and atraumatic.  Right Ear: External ear normal.  Left Ear: External ear normal.  Nose: Nose normal. No mucosal edema or rhinorrhea.  Mouth/Throat: Oropharynx is clear and moist and mucous membranes are normal. No dental abscesses or uvula swelling.  Eyes: Conjunctivae normal and EOM are normal. Pupils are equal, round, and reactive to light.       No nystagymus  Neck: Normal range of motion and full passive range of motion without pain. Neck supple.  Cardiovascular: Normal rate, regular rhythm and normal heart sounds.  Exam reveals no gallop and no friction rub.   No murmur heard. Pulmonary/Chest: Effort normal and breath sounds normal. No respiratory distress. She has no wheezes. She has no rhonchi. She has no rales. She exhibits no tenderness and no crepitus.  Abdominal: Soft. Normal appearance and bowel sounds are normal. She exhibits no distension. There is no tenderness. There is no rebound and no guarding.  Musculoskeletal: Normal range of motion. She exhibits no edema and no tenderness.       Moves all extremities well.   Neurological: She is alert and oriented to  person, place, and time. She has normal strength. No cranial nerve deficit.  Skin: Skin is warm, dry and intact. No rash noted. No erythema. No pallor.  Psychiatric: She has a normal mood and affect. Her speech is  normal and behavior is normal. Her mood appears not anxious.    ED Course  Procedures (including critical care time)   Medications  meclizine (ANTIVERT) tablet 25 mg (not administered)  ondansetron (ZOFRAN) injection 4 mg (not administered)    Pt turned over to Dr Norlene Campbell at change of shift to get CXR and lab results.      Date: 03/05/2012  Rate: 89  Rhythm: normal sinus rhythm  QRS Axis: normal  Intervals: normal  ST/T Wave abnormalities: nonspecific ST/T changes  Conduction Disutrbances:none  Narrative Interpretation:   Old EKG Reviewed: unchanged from 03/02/2012    1. Dizziness     Disposition per Dr Roney Marion, MD, FACEP   MDM          Ward Givens, MD 03/05/12 [phone]

## 2012-03-05 NOTE — ED Notes (Signed)
Pt states she was seen here on Thursday for same complaints and was admitted and was discharged today  Pt states she had an echo and it showed her heart function was only 37%  Pt states she spoke to her cardiologist today and was told to come back to the hospital  Pt states her dr is Dr Mayford Knife with [facility]

## 2012-03-05 NOTE — ED Notes (Signed)
MD at bedside. 

## 2012-03-06 LAB — URINE MICROSCOPIC-ADD ON

## 2012-03-06 LAB — CBC WITH DIFFERENTIAL/PLATELET
Basophils Absolute: 0 10*3/uL (ref 0.0–0.1)
HCT: 33 % — ABNORMAL LOW (ref 36.0–46.0)
Hemoglobin: 10.3 g/dL — ABNORMAL LOW (ref 12.0–15.0)
Lymphocytes Relative: 31 % (ref 12–46)
Monocytes Absolute: 0.7 10*3/uL (ref 0.1–1.0)
Monocytes Relative: 10 % (ref 3–12)
Neutro Abs: 4 10*3/uL (ref 1.7–7.7)
RBC: 3.98 MIL/uL (ref 3.87–5.11)
WBC: 7.2 10*3/uL (ref 4.0–10.5)

## 2012-03-06 LAB — URINALYSIS, ROUTINE W REFLEX MICROSCOPIC
Bilirubin Urine: NEGATIVE
Glucose, UA: NEGATIVE mg/dL
Hgb urine dipstick: NEGATIVE
Ketones, ur: NEGATIVE mg/dL
Protein, ur: NEGATIVE mg/dL

## 2012-03-06 LAB — COMPREHENSIVE METABOLIC PANEL
AST: 56 U/L — ABNORMAL HIGH (ref 0–37)
CO2: 26 mEq/L (ref 19–32)
Chloride: 95 mEq/L — ABNORMAL LOW (ref 96–112)
Creatinine, Ser: 0.95 mg/dL (ref 0.50–1.10)
GFR calc non Af Amer: 74 mL/min — ABNORMAL LOW (ref >90)
Total Bilirubin: 0.5 mg/dL (ref 0.3–1.2)

## 2012-03-06 MED ORDER — MECLIZINE HCL 25 MG PO TABS: 25.0000 mg | ORAL_TABLET | Freq: Three times a day (TID) | ORAL | Status: DC | PRN

## 2012-03-06 NOTE — ED Provider Notes (Signed)
Care received from Dr. Lynelle Doctor. Patient discharged after a [place] for congestive heart failure with diuresis. Patient complaining of dizziness and headache. Labs were pending, patient was given Zofran and meclizine by Dr. Lynelle Doctor. Labs with slightly low sodium, otherwise unremarkable. Patient feels better. She has followup with primary care and with her cardiologist.  Results for orders placed during the hospital encounter of 03/05/12  CBC WITH DIFFERENTIAL      Component Value Range   WBC 7.2  4.0 - 10.5 K/uL   RBC 3.98  3.87 - 5.11 MIL/uL   Hemoglobin 10.3 (*) 12.0 - 15.0 g/dL   HCT 45.4 (*) 09.8 - 11.9 %   MCV 82.9  78.0 - 100.0 fL   MCH 25.9 (*) 26.0 - 34.0 pg   MCHC 31.2  30.0 - 36.0 g/dL   RDW 14.7 (*) 82.9 - 56.2 %   Platelets 423 (*) 150 - 400 K/uL   Neutrophils Relative 56  43 - 77 %   Neutro Abs 4.0  1.7 - 7.7 K/uL   Lymphocytes Relative 31  12 - 46 %   Lymphs Abs 2.2  0.7 - 4.0 K/uL   Monocytes Relative 10  3 - 12 %   Monocytes Absolute 0.7  0.1 - 1.0 K/uL   Eosinophils Relative 3  0 - 5 %   Eosinophils Absolute 0.2  0.0 - 0.7 K/uL   Basophils Relative 0  0 - 1 %   Basophils Absolute 0.0  0.0 - 0.1 K/uL  COMPREHENSIVE METABOLIC PANEL      Component Value Range   Sodium 131 (*) 135 - 145 mEq/L   Potassium 4.7  3.5 - 5.1 mEq/L   Chloride 95 (*) 96 - 112 mEq/L   CO2 26  19 - 32 mEq/L   Glucose, Bld 114 (*) 70 - 99 mg/dL   BUN 20  6 - 23 mg/dL   Creatinine, Ser 1.30  0.50 - 1.10 mg/dL   Calcium 9.4  8.4 - 86.5 mg/dL   Total Protein 9.4 (*) 6.0 - 8.3 g/dL   Albumin 3.6  3.5 - 5.2 g/dL   AST 56 (*) 0 - 37 U/L   ALT 41 (*) 0 - 35 U/L   Alkaline Phosphatase 122 (*) 39 - 117 U/L   Total Bilirubin 0.5  0.3 - 1.2 mg/dL   GFR calc non Af Amer 74 (*) >90 mL/min   GFR calc Af Amer 86 (*) >90 mL/min  PRO B NATRIURETIC PEPTIDE      Component Value Range   Pro B Natriuretic peptide (BNP) 227.9 (*) 0 - 125 pg/mL  URINALYSIS, ROUTINE W REFLEX MICROSCOPIC      Component Value Range     Color, Urine YELLOW  YELLOW   APPearance CLOUDY (*) CLEAR   Specific Gravity, Urine 1.024  1.005 - 1.030   pH 5.5  5.0 - 8.0   Glucose, UA NEGATIVE  NEGATIVE mg/dL   Hgb urine dipstick NEGATIVE  NEGATIVE   Bilirubin Urine NEGATIVE  NEGATIVE   Ketones, ur NEGATIVE  NEGATIVE mg/dL   Protein, ur NEGATIVE  NEGATIVE mg/dL   Urobilinogen, UA 0.2  0.0 - 1.0 mg/dL   Nitrite NEGATIVE  NEGATIVE   Leukocytes, UA SMALL (*) NEGATIVE  URINE MICROSCOPIC-ADD ON      Component Value Range   Squamous Epithelial / LPF FEW (*) RARE   WBC, UA 0-2  <3 WBC/hpf   RBC / HPF 0-2  <3 RBC/hpf   Bacteria, UA FEW (*)  RARE   Casts GRANULAR CAST (*) NEGATIVE   Urine-Other MUCOUS PRESENT     Dg Chest 2 View  03/02/2012  *RADIOLOGY REPORT*  Clinical Data: Shortness of breath and cough.  CHEST - 2 VIEW  Comparison: 03/02/2012  Findings: Cardiomegaly noted with bilateral interstitial opacity. No overt airspace opacity.  No pleural effusion identified.  IMPRESSION:  1.  Cardiomegaly with bilateral interstitial accentuation favoring interstitial edema in the setting of congestive heart failure. Atypical pneumonia is a differential diagnostic consideration.   Original Report Authenticated By: Gaylyn Rong, M.D.    Dg Chest 2 View  03/02/2012  *RADIOLOGY REPORT*  Clinical Data: Shortness of breath.  CHEST - 2 VIEW  Comparison: 05/04/2011.  Findings: The heart is mildly enlarged but stable.  There is central vascular congestion, peribronchial thickening and increased interstitial markings suggesting vascular congestion and probable mild interstitial edema.  No definite pleural effusion.  IMPRESSION: Cardiac enlargement and mild CHF.   Original Report Authenticated By: Rudie Meyer, M.D.       Olivia Mackie, MD 03/06/12 [phone]

## 2012-05-24 DIAGNOSIS — M069 Rheumatoid arthritis, unspecified: Secondary | ICD-10-CM | POA: Insufficient documentation

## 2012-06-08 ENCOUNTER — Other Ambulatory Visit: Payer: Self-pay | Admitting: Obstetrics and Gynecology

## 2012-06-08 ENCOUNTER — Ambulatory Visit (HOSPITAL_COMMUNITY)
Admission: RE | Admit: 2012-06-08 | Discharge: 2012-06-08 | Disposition: A | Discharge status: Home / Self Care | Payer: BC Managed Care – PPO | Source: Ambulatory Visit | Attending: Obstetrics and Gynecology | Admitting: Obstetrics and Gynecology

## 2012-06-08 NOTE — ED Notes (Signed)
BP 133/75, Pulse 89, Wt. 239.5lb

## 2012-06-27 ENCOUNTER — Other Ambulatory Visit: Payer: Self-pay | Admitting: Obstetrics and Gynecology

## 2012-06-28 ENCOUNTER — Other Ambulatory Visit (HOSPITAL_COMMUNITY)
Admission: RE | Admit: 2012-06-28 | Discharge: 2012-06-28 | Disposition: A | Discharge status: Home / Self Care | Payer: BC Managed Care – PPO | Source: Ambulatory Visit | Attending: Obstetrics and Gynecology | Admitting: Obstetrics and Gynecology

## 2012-06-28 ENCOUNTER — Other Ambulatory Visit (HOSPITAL_COMMUNITY)
Admission: RE | Admit: 2012-06-28 | Discharge: 2012-06-28 | Disposition: A | Discharge status: Home / Self Care | Payer: Medicare Other | Source: Ambulatory Visit | Attending: Obstetrics and Gynecology | Admitting: Obstetrics and Gynecology

## 2012-06-28 ENCOUNTER — Other Ambulatory Visit: Payer: Self-pay | Admitting: Obstetrics and Gynecology

## 2012-06-28 DIAGNOSIS — Z1151 Encounter for screening for human papillomavirus (HPV): Secondary | ICD-10-CM | POA: Insufficient documentation

## 2012-06-28 DIAGNOSIS — Z01419 Encounter for gynecological examination (general) (routine) without abnormal findings: Secondary | ICD-10-CM | POA: Diagnosis not present

## 2012-07-15 IMAGING — CR DG CHEST 2V
3 series · 3 of 3 positions shown · non-contrast
Comparison: 04/03/2010

CLINICAL DATA: Recent pneumonia.  Pericardial effusion.

CHEST - 2 VIEW

[w chest pa * (1 of 2)]
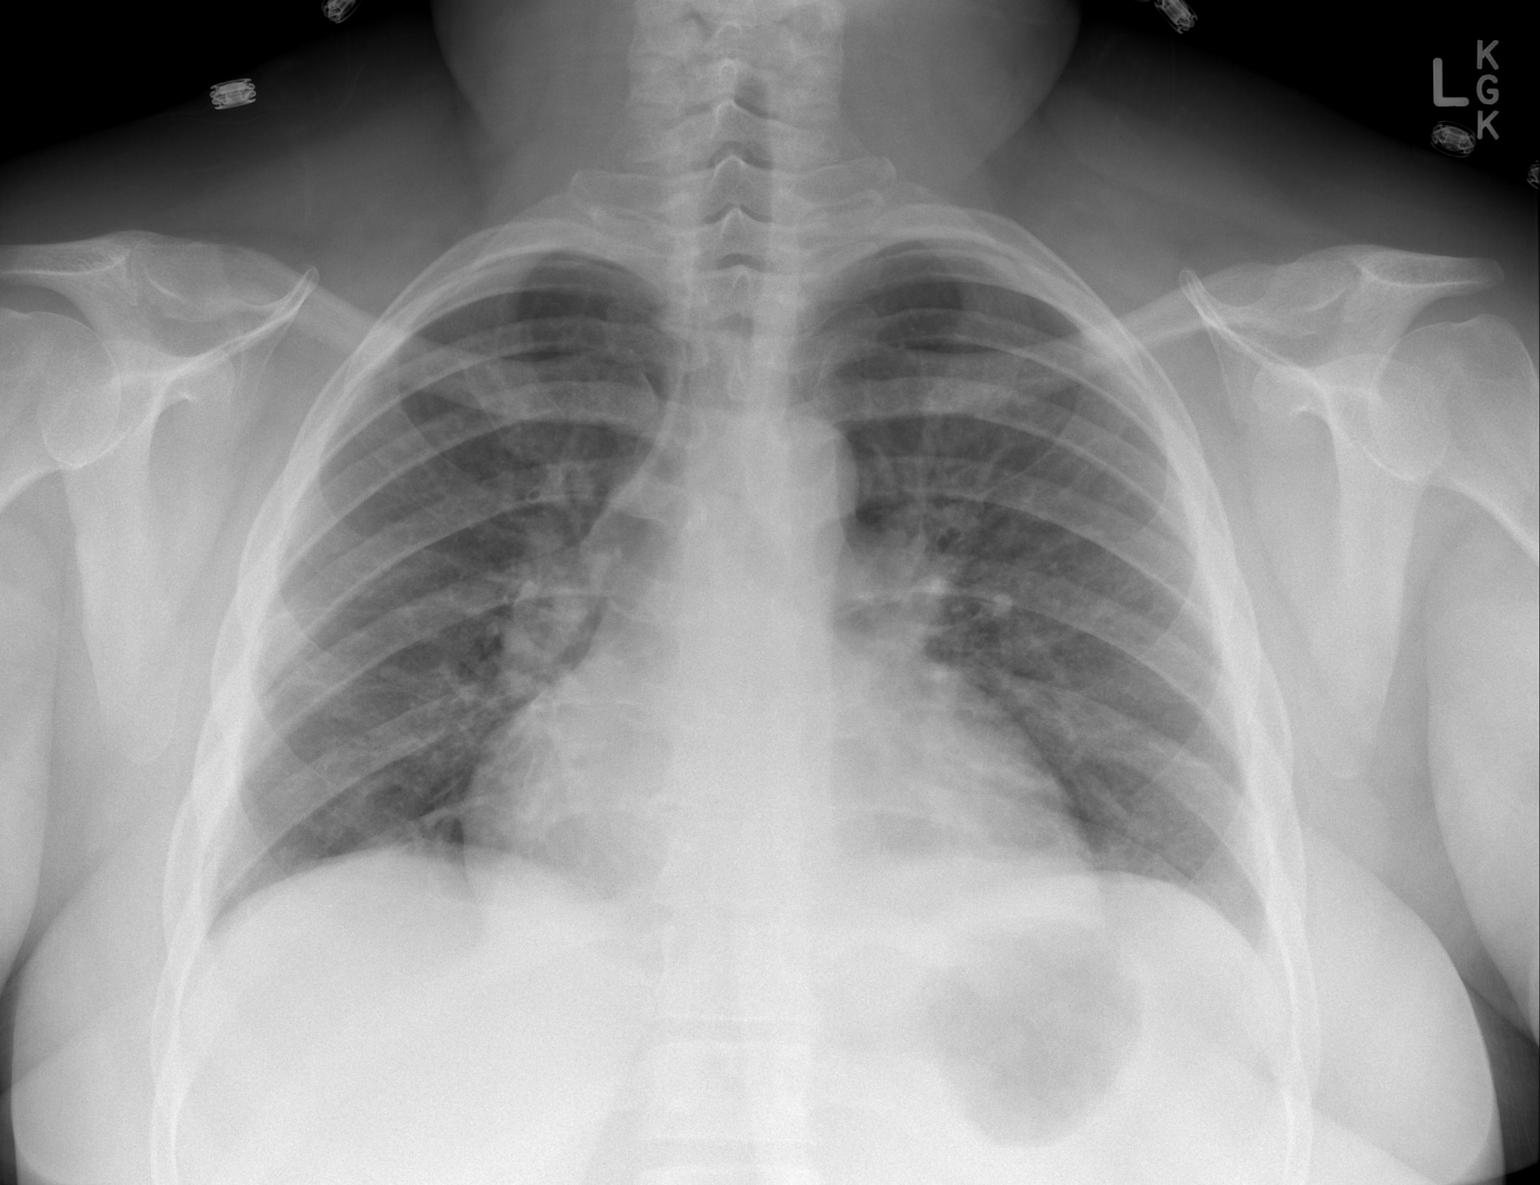

[w chest pa * (2 of 2)]
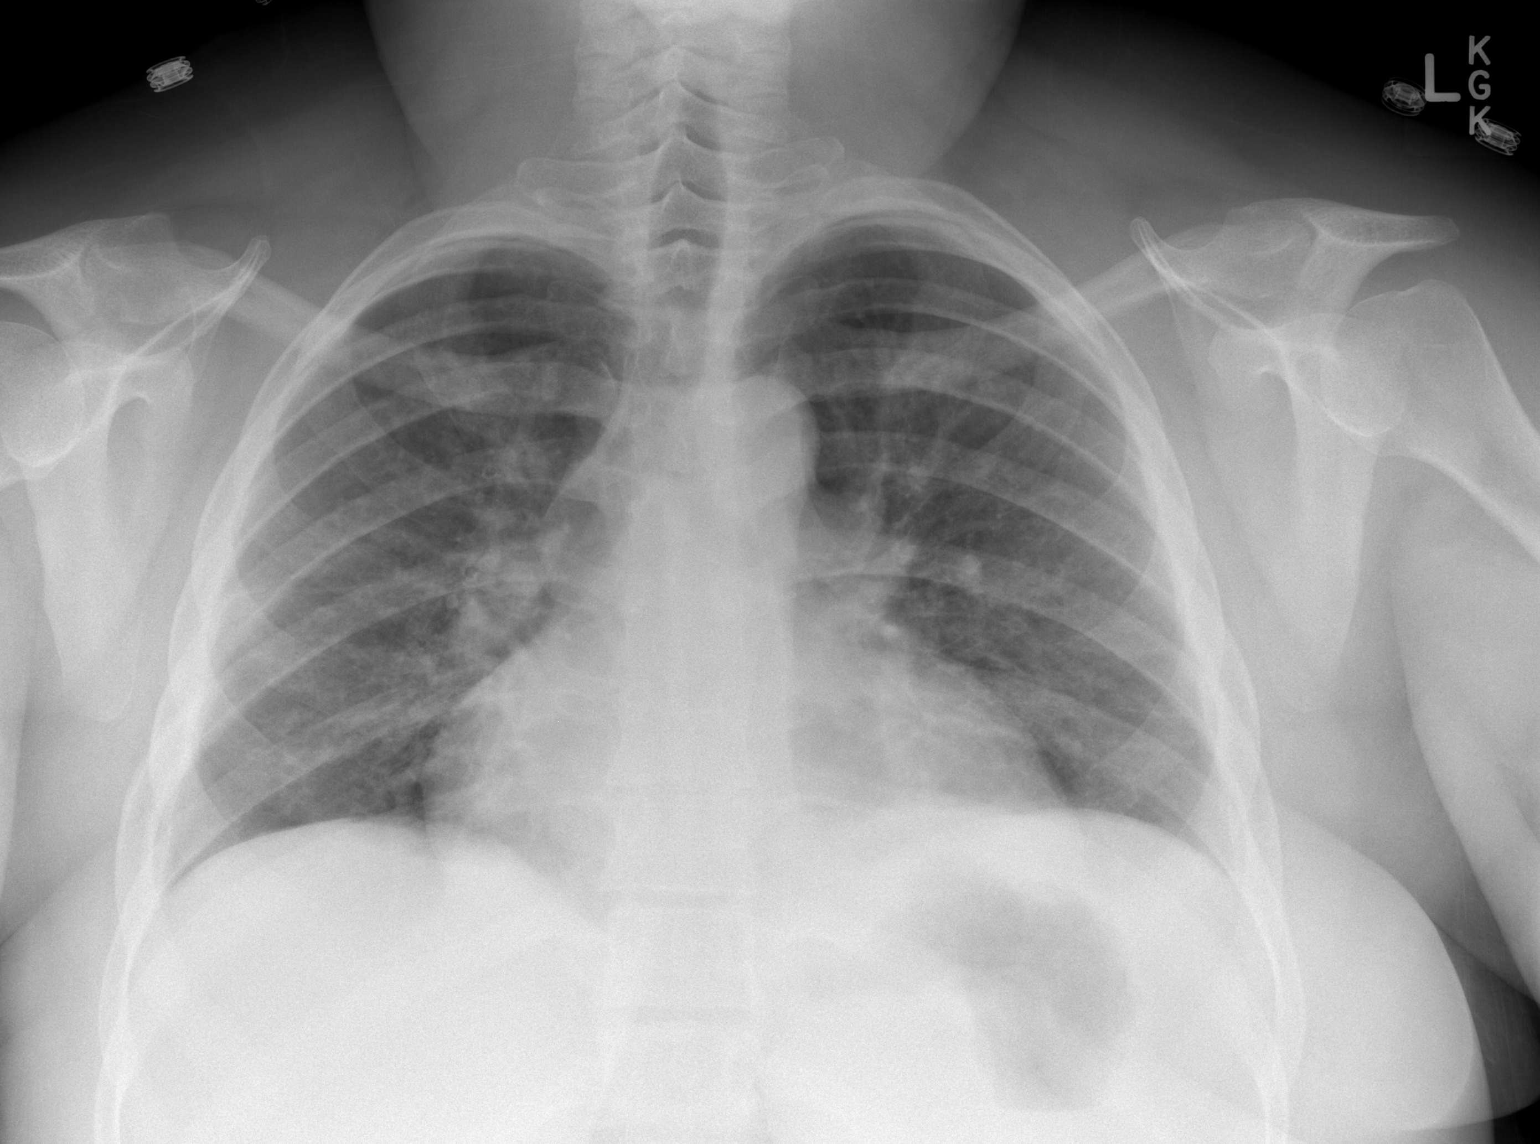

[w chest lat *]
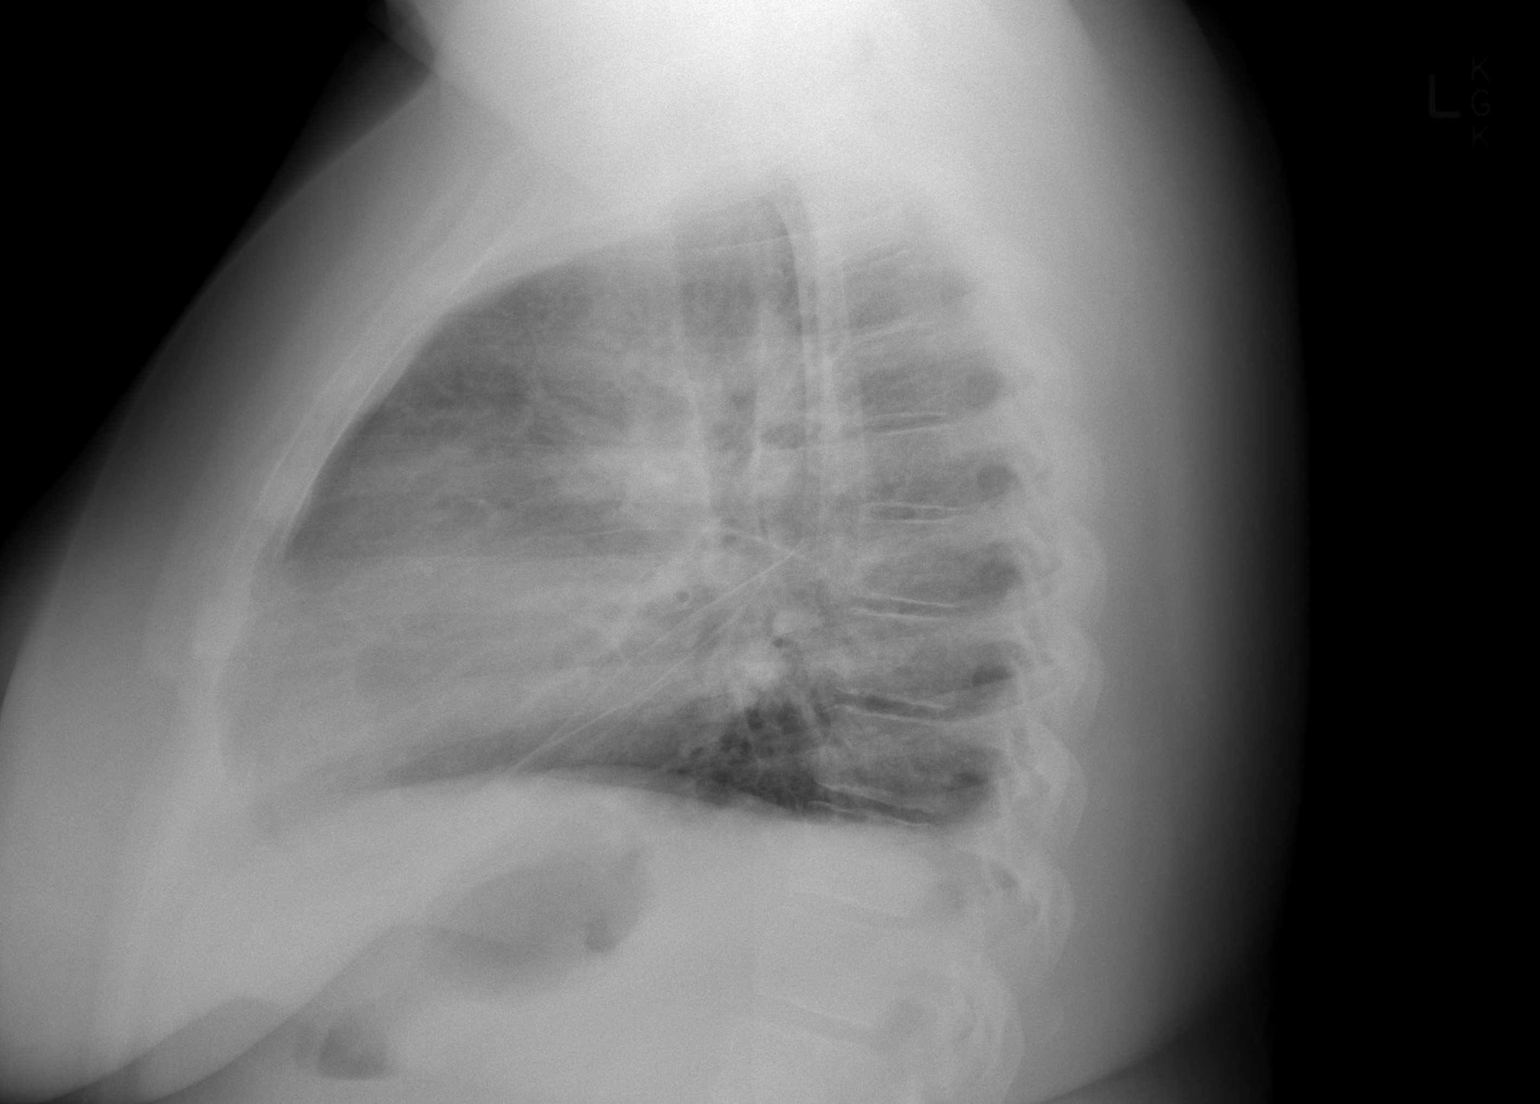

[3 of 3 positions shown; findings below may reference images not displayed]

FINDINGS: Cardiopericardial silhouette remains enlarged.  The
bulbous configuration of the heart is compatible with a degree of
pericardial effusion is seen on the recent CT scan.  The bibasilar
atelectasis/infiltrate has improved substantially in the interval.
No substantial pleural effusion.  There is mild vascular congestion
without overt airspace pulmonary edema. Imaged bony structures of
the thorax are intact.
IMPRESSION: Persistent enlargement of the cardiopericardial silhouette.

Interval decrease in bibasilar collapse / consolidation.

Vascular congestion without overt pulmonary edema.

## 2012-08-18 DIAGNOSIS — N978 Female infertility of other origin: Secondary | ICD-10-CM | POA: Insufficient documentation

## 2012-08-18 DIAGNOSIS — N971 Female infertility of tubal origin: Secondary | ICD-10-CM | POA: Insufficient documentation

## 2012-10-16 ENCOUNTER — Ambulatory Visit: Payer: Medicare Other | Admitting: Cardiovascular Disease

## 2012-10-23 ENCOUNTER — Ambulatory Visit (INDEPENDENT_AMBULATORY_CARE_PROVIDER_SITE_OTHER): Payer: BC Managed Care – PPO | Admitting: Cardiovascular Disease

## 2012-10-23 ENCOUNTER — Encounter: Payer: Self-pay | Admitting: Cardiovascular Disease

## 2012-10-23 VITALS — BP 114/80 | HR 84 | Ht 61.0 in | Wt 240.0 lb

## 2012-10-23 DIAGNOSIS — I428 Other cardiomyopathies: Secondary | ICD-10-CM

## 2012-10-23 NOTE — Patient Instructions (Addendum)
Your physician recommends that you schedule a follow-up appointment in: on July 29 or 30 at end of scheduled office day with Dr. Clifton James  Your physician has requested that you have an echocardiogram. Echocardiography is a painless test that uses sound waves to create images of your heart. It provides your doctor with information about the size and shape of your heart and how well your heart's chambers and valves are working. This procedure takes approximately one hour. There are no restrictions for this procedure.

## 2012-10-23 NOTE — Progress Notes (Signed)
History of Present Illness: 42 yo female with history of RA, Lupus, non-ischemic cardiomyopathy, pericardial effusion who is here today for a second opinion on her cardiac disease. She has been followed in the past by Dr. Armanda Magic in [facility]. She was told several years ago that her heart muscle was weak. Last echo November 2013 with LVEF 30-35%, mild MR. She has been feeling well. No chest pain, SOB, palpitations, near syncope or syncope. She is planning on pursuing in-vitro fertilization. She is wondering if she can pursue this. She has been managed by Dr. Mayford Knife   Primary Care Physician: [facility]  Last Lipid Profile:Lipid Panel     Component Value Date/Time   CHOL 139 03/03/2012 1215   TRIG 115 03/03/2012 1215   HDL 40 03/03/2012 1215   CHOLHDL 3.5 03/03/2012 1215   VLDL 23 03/03/2012 1215   LDLCALC 76 03/03/2012 1215     Past Medical History  Diagnosis Date  . Hypertension   . Rheumatoid arthritis(714.0)   . Herpes simplex of female genitalia   . Cardiomyopathy     Non-ischemic  . Lupus     Past Surgical History  Procedure Laterality Date  . Dental surgery    . Ovary surgery      Current Outpatient Prescriptions  Medication Sig Dispense Refill  . acyclovir (ZOVIRAX) 200 MG capsule Take 200 mg by mouth daily.       Marland Kitchen aspirin 81 MG chewable tablet Chew 1 tablet (81 mg total) by mouth daily.  30 tablet  3  . diphenhydrAMINE (SOMINEX) 25 MG tablet Take 25 mg by mouth at bedtime as needed. allergies      . esomeprazole (NEXIUM) 40 MG capsule Take 40 mg by mouth daily before breakfast.      . fluconazole (DIFLUCAN) 150 MG tablet as directed.      . hydroxychloroquine (PLAQUENIL) 200 MG tablet Take 200 mg by mouth daily.       Marland Kitchen losartan (COZAAR) 50 MG tablet Take 50 mg by mouth daily.      . metFORMIN (GLUCOPHAGE) 500 MG tablet Take 500 mg by mouth 2 (two) times daily with a meal.      . methocarbamol (ROBAXIN) 500 MG tablet Take 500 mg by mouth 2 (two) times  daily as needed. Muscle spasms      . metoprolol succinate (TOPROL-XL) 25 MG 24 hr tablet Take 1 tablet (25 mg total) by mouth daily.  30 tablet  3  . potassium chloride (K-DUR) 10 MEQ tablet Take 1 tablet by mouth daily.      . traMADol (ULTRAM) 50 MG tablet Take 1 tablet (50 mg total) by mouth 4 (four) times daily. pain  60 tablet  1   No current facility-administered medications for this visit.    Allergies  Allergen Reactions  . Clarithromycin     REACTION: upset stomach  . Hydrocodone     REACTION: nausea  . Ondansetron Itching  . Penicillins Swelling  . Tdap (Diphth-Acell Pertussis-Tetanus)     History   Social History  . Marital Status: Single    Spouse Name: N/A    Number of Children: 0  . Years of Education: N/A   Occupational History  . Disabled    Social History Main Topics  . Smoking status: Never Smoker   . Smokeless tobacco: Never Used  . Alcohol Use: No  . Drug Use: No  . Sexually Active:    Other Topics Concern  . Not on  file   Social History Narrative  . No narrative on file    Family History  Problem Relation Age of Onset  . Hypertension    . Diabetes Mellitus II    . Heart attack Mother   . CAD Mother     Review of Systems:  As stated in the HPI and otherwise negative.   BP 114/80  Pulse 84  Ht 5\' 1"  (1.549 m)  Wt 240 lb (108.863 kg)  BMI 45.37 kg/m2  SpO2 96%  Physical Examination: General: Well developed, well nourished, NAD HEENT: OP clear, mucus membranes moist SKIN: warm, dry. No rashes. Neuro: No focal deficits Musculoskeletal: Muscle strength 5/5 all ext Psychiatric: Mood and affect normal Neck: No JVD, no carotid bruits, no thyromegaly, no lymphadenopathy. Lungs:Clear bilaterally, no wheezes, rhonci, crackles Cardiovascular: Regular rate and rhythm. No murmurs, gallops or rubs. Abdomen:Soft. Bowel sounds present. Non-tender.  Extremities: No lower extremity edema. Pulses are 2 + in the bilateral DP/PT.  Echo  03/03/12: Left ventricle: The cavity size was normal. Systolic function was moderately to severely reduced. The estimated ejection fraction was in the range of 30% to 35%. Wall motion was normal; there were no regional wall motion abnormalities. - Mitral valve: Mild regurgitation. - Right ventricle: Systolic function was moderately reduced. - Pulmonary arteries: Systolic pressure was mildly increased.  Assessment and Plan:   1. Non-ischemic CM: Followed in [facility]. Cath 2012 with reported normal coronary arteries. She is on appropriate medical therapy. Will repeat echo to assess LVEF. Will get records from [facility]. No medication changes. Further planning to follow after records reviewed and echo repeated.

## 2012-10-26 ENCOUNTER — Ambulatory Visit (HOSPITAL_COMMUNITY): Payer: Medicare Other | Attending: Cardiovascular Disease | Admitting: Radiology

## 2012-10-26 DIAGNOSIS — I428 Other cardiomyopathies: Secondary | ICD-10-CM

## 2012-10-26 NOTE — Progress Notes (Signed)
Echocardiogram performed.  

## 2012-11-07 ENCOUNTER — Ambulatory Visit: Payer: BC Managed Care – PPO | Admitting: Cardiovascular Disease

## 2012-11-14 ENCOUNTER — Telehealth: Payer: Self-pay | Admitting: Cardiovascular Disease

## 2012-11-14 NOTE — Telephone Encounter (Signed)
ROI Faxed to Dr.Turner/[facility] [phone]  11/14/12/KM

## 2012-11-23 ENCOUNTER — Encounter: Payer: Self-pay | Admitting: Cardiovascular Disease

## 2012-11-23 ENCOUNTER — Ambulatory Visit (INDEPENDENT_AMBULATORY_CARE_PROVIDER_SITE_OTHER): Payer: Medicare Other | Admitting: Cardiovascular Disease

## 2012-11-23 VITALS — BP 110/84 | HR 82 | Ht 61.0 in | Wt 239.0 lb

## 2012-11-23 DIAGNOSIS — I428 Other cardiomyopathies: Secondary | ICD-10-CM

## 2012-11-23 NOTE — Patient Instructions (Addendum)
Your physician recommends that you schedule a follow-up appointment in: 2 MONTHS with Dr Clifton James  Your physician recommends that you continue on your current medications as directed. Please refer to the Current Medication list given to you today.

## 2012-11-23 NOTE — Progress Notes (Addendum)
History of Present Illness: 42 yo female with history of RA, Lupus, non-ischemic cardiomyopathy, pericardial effusion who is here today for cardiac follow up. I saw her as new patient 10/23/12 for a second opinion on her cardiac disease. She has been followed in the past by Dr. Armanda Magic in [facility]. She was told several years ago that her heart muscle was weak. Last echo November 2013 with LVEF 30-35%, mild MR. Cath 2012 with normal coronary arteries. She has been feeling well. No chest pain, SOB, palpitations, near syncope or syncope. She is planning on pursuing in-vitro fertilization. She is wondering if she can pursue this.   She is here today for follow up. She is feeling well. No chest pain, SOB, orthopnea, PND.   Primary Care Physician: [facility]  Last Lipid Profile:Lipid Panel     Component Value Date/Time   CHOL 139 03/03/2012 1215   TRIG 115 03/03/2012 1215   HDL 40 03/03/2012 1215   CHOLHDL 3.5 03/03/2012 1215   VLDL 23 03/03/2012 1215   LDLCALC 76 03/03/2012 1215     Past Medical History  Diagnosis Date  . Hypertension   . Rheumatoid arthritis(714.0)   . Herpes simplex of female genitalia   . Cardiomyopathy     Non-ischemic  . Lupus     Past Surgical History  Procedure Laterality Date  . Dental surgery    . Ovary surgery      Current Outpatient Prescriptions  Medication Sig Dispense Refill  . acyclovir (ZOVIRAX) 200 MG capsule Take 200 mg by mouth daily.       Marland Kitchen aspirin 81 MG chewable tablet Chew 1 tablet (81 mg total) by mouth daily.  30 tablet  3  . esomeprazole (NEXIUM) 40 MG capsule Take 40 mg by mouth daily before breakfast.      . fluconazole (DIFLUCAN) 150 MG tablet as directed.      . hydroxychloroquine (PLAQUENIL) 200 MG tablet Take 200 mg by mouth daily.       Marland Kitchen losartan (COZAAR) 50 MG tablet Take 50 mg by mouth daily.      . metFORMIN (GLUCOPHAGE) 500 MG tablet Take 500 mg by mouth 2 (two) times daily with a meal.      . methocarbamol  (ROBAXIN) 500 MG tablet Take 500 mg by mouth 2 (two) times daily as needed. Muscle spasms      . metoprolol succinate (TOPROL-XL) 25 MG 24 hr tablet Take 1 tablet (25 mg total) by mouth daily.  30 tablet  3  . potassium chloride (K-DUR) 10 MEQ tablet Take 1 tablet by mouth daily.      . traMADol (ULTRAM) 50 MG tablet Take 1 tablet (50 mg total) by mouth 4 (four) times daily. pain  60 tablet  1   No current facility-administered medications for this visit.    Allergies  Allergen Reactions  . Clarithromycin     REACTION: upset stomach  . Hydrocodone     REACTION: nausea  . Ondansetron Itching  . Penicillins Swelling  . Tdap [Diphth-Acell Pertussis-Tetanus]     History   Social History  . Marital Status: Single    Spouse Name: N/A    Number of Children: 0  . Years of Education: N/A   Occupational History  . Disabled    Social History Main Topics  . Smoking status: Never Smoker   . Smokeless tobacco: Never Used  . Alcohol Use: No  . Drug Use: No  . Sexual Activity:  Other Topics Concern  . Not on file   Social History Narrative  . No narrative on file    Family History  Problem Relation Age of Onset  . Hypertension    . Diabetes Mellitus II    . Heart attack Mother   . CAD Mother     Review of Systems:  As stated in the HPI and otherwise negative.   BP 110/84  Pulse 82  Ht 5\' 1"  (1.549 m)  Wt 239 lb (108.41 kg)  BMI 45.18 kg/m2  SpO2 99%  Physical Examination: General: Well developed, well nourished, NAD HEENT: OP clear, mucus membranes moist SKIN: warm, dry. No rashes. Neuro: No focal deficits Musculoskeletal: Muscle strength 5/5 all ext Psychiatric: Mood and affect normal Neck: No JVD, no carotid bruits, no thyromegaly, no lymphadenopathy. Lungs:Clear bilaterally, no wheezes, rhonci, crackles Cardiovascular: Regular rate and rhythm. No murmurs, gallops or rubs. Abdomen:Soft. Bowel sounds present. Non-tender.  Extremities: No lower extremity  edema. Pulses are 2 + in the bilateral DP/PT.  EKG:  Echo 10/26/12: Left ventricle: LVEF is approximately 30% with diffuse hypokinesis. The cavity size was mildly dilated. Wall thickness was normal. - Right ventricle: Systolic function was mildly reduced.  Assessment and Plan:   1. Non-ischemic CM: Followed in past in [facility]. Cath April 2012 with reported normal coronary arteries. She is on appropriate medical therapy. Repeat echo with LVEF 30%. Will attempt again to get records from [facility]. No medication changes today. We briefly discussed ICD today. I would like to arrange a cardiac MRI but she wishes to delay for now. Will get records and if no MRI has been done, will arrange cardiac MRI. I will see her back in 2 months.   Addendum 12/21/12: Records received from [place]. ABI normal 5/13. Venous dopplers June 2013 with no DVT.

## 2013-01-03 ENCOUNTER — Ambulatory Visit: Payer: BC Managed Care – PPO | Admitting: Cardiovascular Disease

## 2013-01-24 ENCOUNTER — Ambulatory Visit (INDEPENDENT_AMBULATORY_CARE_PROVIDER_SITE_OTHER): Payer: Medicare Other | Admitting: Cardiovascular Disease

## 2013-01-24 ENCOUNTER — Encounter (INDEPENDENT_AMBULATORY_CARE_PROVIDER_SITE_OTHER): Payer: Self-pay

## 2013-01-24 ENCOUNTER — Encounter: Payer: Self-pay | Admitting: Cardiovascular Disease

## 2013-01-24 VITALS — BP 124/82 | HR 72 | Ht 61.0 in | Wt 247.0 lb

## 2013-01-24 DIAGNOSIS — I428 Other cardiomyopathies: Secondary | ICD-10-CM

## 2013-01-24 LAB — BASIC METABOLIC PANEL
GFR: 147.46 mL/min (ref >60.00)
Potassium: 3.3 mEq/L — ABNORMAL LOW (ref 3.5–5.1)
Sodium: 136 mEq/L (ref 135–145)

## 2013-01-24 NOTE — Patient Instructions (Signed)
Your physician wants you to follow-up in:  3 months. You will receive a reminder letter in the mail two months in advance. If you don't receive a letter, please call our office to schedule the follow-up appointment.  Your physician has requested that you have a cardiac MRI. Cardiac MRI uses a computer to create images of your heart as its beating, producing both still and moving pictures of your heart and major blood vessels. For further information please visit [email or web address].

## 2013-01-24 NOTE — Progress Notes (Signed)
History of Present Illness: 42 yo female with history of RA, Lupus, non-ischemic cardiomyopathy, pericardial effusion who is here today for cardiac follow up. I saw her as new patient 10/23/12 for a second opinion on her cardiac disease. She has been followed in the past by Dr. Armanda Magic in [facility]. She was told several years ago that her heart muscle was weak. Echo November 2013 with LVEF 30-35%, mild MR. Cath 2012 with normal coronary arteries. She has been feeling well. No chest pain, SOB, palpitations, near syncope or syncope. She is planning on pursuing in-vitro fertilization. She is wondering if she can pursue this. Records received from [place]. ABI normal 5/13. Venous dopplers June 2013 with no DVT. Repeat echo July 2013 with LVEF 30% with diffuse hypokinesis. The cavity size was mildly dilated. Wall thickness was normal. Right ventricular systolic function was mildly reduced.  She is here today for follow up. She is feeling well. No chest pain, SOB, orthopnea, PND.   Primary Care Physician: [facility]  Last Lipid Profile:Lipid Panel     Component Value Date/Time   CHOL 139 03/03/2012 1215   TRIG 115 03/03/2012 1215   HDL 40 03/03/2012 1215   CHOLHDL 3.5 03/03/2012 1215   VLDL 23 03/03/2012 1215   LDLCALC 76 03/03/2012 1215     Past Medical History  Diagnosis Date  . Hypertension   . Rheumatoid arthritis(714.0)   . Herpes simplex of female genitalia   . Cardiomyopathy     Non-ischemic  . Lupus     Past Surgical History  Procedure Laterality Date  . Dental surgery    . Ovary surgery      Current Outpatient Prescriptions  Medication Sig Dispense Refill  . acyclovir (ZOVIRAX) 200 MG capsule Take 200 mg by mouth daily.       Marland Kitchen aspirin 81 MG chewable tablet Chew 1 tablet (81 mg total) by mouth daily.  30 tablet  3  . esomeprazole (NEXIUM) 40 MG capsule Take 40 mg by mouth daily before breakfast.      . fluconazole (DIFLUCAN) 150 MG tablet as directed.      .  hydroxychloroquine (PLAQUENIL) 200 MG tablet Take 200 mg by mouth daily.       Marland Kitchen losartan (COZAAR) 50 MG tablet Take 50 mg by mouth daily.      . metFORMIN (GLUCOPHAGE) 500 MG tablet Take 500 mg by mouth 2 (two) times daily with a meal.      . methocarbamol (ROBAXIN) 500 MG tablet Take 500 mg by mouth 2 (two) times daily as needed. Muscle spasms      . metoprolol succinate (TOPROL-XL) 25 MG 24 hr tablet Take 1 tablet (25 mg total) by mouth daily.  30 tablet  3  . potassium chloride (K-DUR) 10 MEQ tablet Take 1 tablet by mouth daily.      . traMADol (ULTRAM) 50 MG tablet Take 1 tablet (50 mg total) by mouth 4 (four) times daily. pain  60 tablet  1   No current facility-administered medications for this visit.    Allergies  Allergen Reactions  . Clarithromycin     REACTION: upset stomach  . Hydrocodone     REACTION: nausea  . Ondansetron Itching  . Penicillins Swelling  . Tdap [Diphth-Acell Pertussis-Tetanus]     History   Social History  . Marital Status: Single    Spouse Name: N/A    Number of Children: 0  . Years of Education: N/A   Occupational  History  . Disabled    Social History Main Topics  . Smoking status: Never Smoker   . Smokeless tobacco: Never Used  . Alcohol Use: No  . Drug Use: No  . Sexual Activity:    Other Topics Concern  . Not on file   Social History Narrative  . No narrative on file    Family History  Problem Relation Age of Onset  . Hypertension    . Diabetes Mellitus II    . Heart attack Mother   . CAD Mother     Review of Systems:  As stated in the HPI and otherwise negative.   BP 124/82  Pulse 72  Ht 5\' 1"  (1.549 m)  Wt 247 lb (112.038 kg)  BMI 46.69 kg/m2  Physical Examination: General: Well developed, well nourished, NAD HEENT: OP clear, mucus membranes moist SKIN: warm, dry. No rashes. Neuro: No focal deficits Musculoskeletal: Muscle strength 5/5 all ext Psychiatric: Mood and affect normal Neck: No JVD, no carotid  bruits, no thyromegaly, no lymphadenopathy. Lungs:Clear bilaterally, no wheezes, rhonci, crackles Cardiovascular: Regular rate and rhythm. No murmurs, gallops or rubs. Abdomen:Soft. Bowel sounds present. Non-tender.  Extremities: No lower extremity edema. Pulses are 2 + in the bilateral DP/PT.  Echo 10/26/12: Left ventricle: LVEF is approximately 30% with diffuse hypokinesis. The cavity size was mildly dilated. Wall thickness was normal. - Right ventricle: Systolic function was mildly reduced.  Assessment and Plan:   1. Non-ischemic CM: Followed in past in [facility]. Cath April 2012 with reported normal coronary arteries. She is on appropriate medical therapy. Repeat echo July 2014 with LVEF 30%.  I  would like to arrange a cardiac MRI to better define her LVEF, assess myocardium. She agrees to proceed. Continue current meds. Will have to discuss ICD in the future if LVEF remains below 35%.  I have recommended that she not become pregnant at this time as it is very risky given her cardiomyopathy as well as connective tissue disease.

## 2013-02-08 ENCOUNTER — Encounter: Payer: Self-pay | Admitting: Cardiovascular Disease

## 2013-02-20 ENCOUNTER — Encounter: Payer: Self-pay | Admitting: Cardiovascular Disease

## 2013-02-23 ENCOUNTER — Ambulatory Visit (HOSPITAL_COMMUNITY): Admission: RE | Admit: 2013-02-23 | Payer: Medicare Other | Source: Ambulatory Visit

## 2013-02-27 ENCOUNTER — Encounter: Payer: Self-pay | Admitting: Cardiovascular Disease

## 2013-02-28 ENCOUNTER — Telehealth: Payer: Self-pay | Admitting: Cardiovascular Disease

## 2013-02-28 DIAGNOSIS — I428 Other cardiomyopathies: Secondary | ICD-10-CM

## 2013-02-28 NOTE — Telephone Encounter (Signed)
Spoke with pt who is asking if MRI can be done as an open MRI. I told her this cannot be done for a cardiac MRI. I explained to pt the reason test was ordered. She is asking if she there is any other test that can be done and if she really needed to have MRI done. She states she will try again if Dr. Clifton James feels it is necessary for her to have MRI.

## 2013-02-28 NOTE — Telephone Encounter (Signed)
New Problem:  Pt states she went to [facility] for an MRI and she couldn't do it b/c it was closed. Pt is asking if they can order her to have an open xray test. Pt is also asking if the test is medically necessary. Pt states she wants to know why the test was ordered to begin with... Please advise

## 2013-02-28 NOTE — Telephone Encounter (Signed)
Follow Up  ° °Pt returned the call  °

## 2013-02-28 NOTE — Telephone Encounter (Signed)
Left message to call back  

## 2013-03-01 NOTE — Telephone Encounter (Signed)
Left message to call back  

## 2013-03-01 NOTE — Telephone Encounter (Signed)
Spoke with pt and gave her information from Dr. Clifton James.  She would like to proceed with MRI. She would like to try Valium prior to test. Dr. Clifton James will be back in office and prescription can be written at that time.  Pt will stop by office tomorrow to pick up prescription.

## 2013-03-01 NOTE — Telephone Encounter (Signed)
The MRI was to better define her LVEF because she will likely need referral to EP for consideration of ICD. If she cannot complete the MRI, we can cancel. No other tests will give [place] that information. Does she think a little Valium would help her complete the test? chris

## 2013-03-01 NOTE — Telephone Encounter (Signed)
Patient is returning your call. Please call [phone].

## 2013-03-02 MED ORDER — DIAZEPAM 5 MG PO TABS: ORAL_TABLET | ORAL | Status: DC

## 2013-03-02 NOTE — Telephone Encounter (Signed)
Prescription left at front desk for pt to pick up.

## 2013-03-07 ENCOUNTER — Ambulatory Visit (HOSPITAL_COMMUNITY): Admission: RE | Admit: 2013-03-07 | Payer: Medicare Other | Source: Ambulatory Visit

## 2013-03-13 ENCOUNTER — Telehealth: Payer: Self-pay | Admitting: Cardiovascular Disease

## 2013-03-13 ENCOUNTER — Other Ambulatory Visit: Payer: Self-pay | Admitting: Cardiovascular Disease

## 2013-03-13 ENCOUNTER — Ambulatory Visit (HOSPITAL_COMMUNITY)
Admission: RE | Admit: 2013-03-13 | Discharge: 2013-03-13 | Disposition: A | Discharge status: Home / Self Care | Payer: Medicare Other | Source: Ambulatory Visit | Attending: Cardiovascular Disease | Admitting: Cardiovascular Disease

## 2013-03-13 DIAGNOSIS — I517 Cardiomegaly: Secondary | ICD-10-CM | POA: Insufficient documentation

## 2013-03-13 DIAGNOSIS — I428 Other cardiomyopathies: Secondary | ICD-10-CM

## 2013-03-13 LAB — CREATININE, SERUM
Creatinine, Ser: 0.46 mg/dL — ABNORMAL LOW (ref 0.50–1.10)
GFR calc Af Amer: >90 mL/min (ref >90)
GFR calc non Af Amer: >90 mL/min (ref >90)

## 2013-03-13 NOTE — Telephone Encounter (Signed)
Follow up   [facility] MRI sup.  needs new written order for IV start.   It could not be done today the appoint was rescheduled 12/16.    Pt need a PO med order for claustrophobia.   Fax # [phone]    Thank you!

## 2013-03-13 NOTE — Telephone Encounter (Signed)
Spoke with [name] in MRI (phone [phone]).  Pt was to have MRI today but IV team was unable to start IV.  Radiology is requesting an order for ultrasound guided IV to be started in Interventional Radiology.  Pt took Valium prior to procedure today at home and radiology is requesting order for po Valium to be given in radiology just prior to repeat MRI.  [name] is checking how these orders can be put in EPIC and I will call her back tomorrow to follow up.

## 2013-03-14 NOTE — Telephone Encounter (Signed)
Spoke with [name] and orders can be put in [facility] under hospital encounter. ---Place under Interventional Radiology with order to read IV start by MD. Order for Valium 5 mg po prior to MRI can be placed in EPIC also.  If unable to place in EPIC can be faxed to number below

## 2013-03-14 NOTE — Telephone Encounter (Signed)
Pat, Can we handwrite the orders? I would otherwise say that the MD in the hospital in radiology can write this order. Thayer Ohm

## 2013-03-14 NOTE — Telephone Encounter (Signed)
Order faxed to Radiology at number below.

## 2013-03-23 ENCOUNTER — Other Ambulatory Visit: Payer: Self-pay | Admitting: Family Medicine

## 2013-03-23 ENCOUNTER — Ambulatory Visit
Admission: RE | Admit: 2013-03-23 | Discharge: 2013-03-23 | Disposition: A | Discharge status: Home / Self Care | Payer: Commercial Managed Care - HMO | Source: Ambulatory Visit | Attending: Family Medicine | Admitting: Family Medicine

## 2013-03-23 DIAGNOSIS — R059 Cough, unspecified: Secondary | ICD-10-CM

## 2013-03-23 DIAGNOSIS — R05 Cough: Secondary | ICD-10-CM

## 2013-03-27 ENCOUNTER — Ambulatory Visit (HOSPITAL_COMMUNITY)
Admission: RE | Admit: 2013-03-27 | Discharge: 2013-03-27 | Disposition: A | Discharge status: Home / Self Care | Payer: Medicare Other | Source: Ambulatory Visit | Attending: Cardiovascular Disease | Admitting: Cardiovascular Disease

## 2013-03-27 ENCOUNTER — Other Ambulatory Visit: Payer: Self-pay | Admitting: Cardiovascular Disease

## 2013-03-27 ENCOUNTER — Telehealth: Payer: Self-pay | Admitting: *Deleted

## 2013-03-27 DIAGNOSIS — I428 Other cardiomyopathies: Secondary | ICD-10-CM

## 2013-03-27 MED ORDER — GADOBENATE DIMEGLUMINE 529 MG/ML IV SOLN
35.0000 mL | Freq: Once | INTRAVENOUS | Status: AC
  Administered 2013-03-27: 35 mL via INTRAVENOUS

## 2013-03-27 MED ORDER — DIAZEPAM 5 MG PO TABS
ORAL_TABLET | ORAL | Status: AC
  Filled 2013-03-27: qty 2

## 2013-03-27 MED ORDER — DIAZEPAM 5 MG PO TABS
10.0000 mg | ORAL_TABLET | Freq: Once | ORAL | Status: AC
  Administered 2013-03-27: 10 mg via ORAL

## 2013-03-27 NOTE — Telephone Encounter (Signed)
Received phone call from [facility] Radiology nurse. Patient is there for an MRI ordered by Dr.McAlhany and is anxious and needs an order for Valium. Advised OK to give Valium 10mg  one time dose verbal order from Dr.McAlhany. Patient has a driver that will be taking her home.

## 2013-03-28 ENCOUNTER — Telehealth: Payer: Self-pay | Admitting: Cardiovascular Disease

## 2013-03-28 NOTE — Telephone Encounter (Signed)
Spoke with pt and reviewed cardiac MRI results with her.

## 2013-03-28 NOTE — Telephone Encounter (Signed)
Follow up ° ° °Pt returning our call please give her a call back.  °

## 2013-05-11 ENCOUNTER — Encounter: Payer: Self-pay | Admitting: Cardiovascular Disease

## 2013-05-11 ENCOUNTER — Ambulatory Visit (INDEPENDENT_AMBULATORY_CARE_PROVIDER_SITE_OTHER): Payer: Commercial Managed Care - HMO | Admitting: Cardiovascular Disease

## 2013-05-11 VITALS — BP 147/97 | HR 87 | Ht 61.0 in | Wt 248.0 lb

## 2013-05-11 DIAGNOSIS — I428 Other cardiomyopathies: Secondary | ICD-10-CM

## 2013-05-11 NOTE — Patient Instructions (Signed)
Your physician wants you to follow-up in:  12 months.  You will receive a reminder letter in the mail two months in advance. If you don't receive a letter, please call our office to schedule the follow-up appointment.   

## 2013-05-11 NOTE — Progress Notes (Signed)
History of Present Illness: 43 yo female with history of RA, Lupus, non-ischemic cardiomyopathy, pericardial effusion who is here today for cardiac follow up. I saw her as new patient 10/23/12 for a second opinion on her cardiac disease. She has been followed in the past by Dr. Armanda Magic in [facility]. She was told several years ago that her heart muscle was weak. Echo November 2013 with LVEF 30-35%, mild MR. Cath 2012 with normal coronary arteries. She had done well with no symptoms and was planning on pursuing in-vitro fertilization and she wanted my opinion on doing this. ABI normal 5/13. Venous dopplers June 2013 with no DVT. Repeat echo July 2013 with LVEF 30% with diffuse hypokinesis. The cavity size was mildly dilated. Wall thickness was normal. Right ventricular systolic function was mildly reduced.  She is here today for follow up. She is feeling well. No chest pain, SOB, orthopnea, PND.   Primary Care Physician: [facility]  Last Lipid Profile:Lipid Panel     Component Value Date/Time   CHOL 139 03/03/2012 1215   TRIG 115 03/03/2012 1215   HDL 40 03/03/2012 1215   CHOLHDL 3.5 03/03/2012 1215   VLDL 23 03/03/2012 1215   LDLCALC 76 03/03/2012 1215     Past Medical History  Diagnosis Date  . Hypertension   . Rheumatoid arthritis(714.0)   . Herpes simplex of female genitalia   . Cardiomyopathy     Non-ischemic  . Lupus     Past Surgical History  Procedure Laterality Date  . Dental surgery    . Ovary surgery      Current Outpatient Prescriptions  Medication Sig Dispense Refill  . acyclovir (ZOVIRAX) 200 MG capsule Take 200 mg by mouth daily.       Marland Kitchen aspirin 81 MG chewable tablet Chew 1 tablet (81 mg total) by mouth daily.  30 tablet  3  . esomeprazole (NEXIUM) 40 MG capsule Take 40 mg by mouth daily before breakfast.      . hydroxychloroquine (PLAQUENIL) 200 MG tablet Take 200 mg by mouth daily.       Marland Kitchen losartan (COZAAR) 50 MG tablet Take 50 mg by mouth  daily.      . metFORMIN (GLUCOPHAGE) 500 MG tablet Take 500 mg by mouth 2 (two) times daily with a meal.      . methocarbamol (ROBAXIN) 500 MG tablet Take 500 mg by mouth 2 (two) times daily as needed. Muscle spasms      . metoprolol succinate (TOPROL-XL) 25 MG 24 hr tablet Take 1 tablet (25 mg total) by mouth daily.  30 tablet  3  . potassium chloride (K-DUR) 10 MEQ tablet Take 1 tablet by mouth daily.      . traMADol (ULTRAM) 50 MG tablet Take 1 tablet (50 mg total) by mouth 4 (four) times daily. pain  60 tablet  1   No current facility-administered medications for this visit.    Allergies  Allergen Reactions  . Clarithromycin     REACTION: upset stomach  . Hydrocodone     REACTION: nausea  . Ondansetron Itching  . Penicillins Swelling  . Tdap [Diphth-Acell Pertussis-Tetanus]     History   Social History  . Marital Status: Single    Spouse Name: N/A    Number of Children: 0  . Years of Education: N/A   Occupational History  . Disabled    Social History Main Topics  . Smoking status: Never Smoker   . Smokeless tobacco: Never  Used  . Alcohol Use: No  . Drug Use: No  . Sexual Activity:    Other Topics Concern  . Not on file   Social History Narrative  . No narrative on file    Family History  Problem Relation Age of Onset  . Hypertension    . Diabetes Mellitus II    . Heart attack Mother   . CAD Mother     Review of Systems:  As stated in the HPI and otherwise negative.   BP 147/97  Pulse 87  Ht 5\' 1"  (1.549 m)  Wt 248 lb (112.492 kg)  BMI 46.88 kg/m2  Physical Examination: General: Well developed, well nourished, NAD HEENT: OP clear, mucus membranes moist SKIN: warm, dry. No rashes. Neuro: No focal deficits Musculoskeletal: Muscle strength 5/5 all ext Psychiatric: Mood and affect normal Neck: No JVD, no carotid bruits, no thyromegaly, no lymphadenopathy. Lungs:Clear bilaterally, no wheezes, rhonci, crackles Cardiovascular: Regular rate and  rhythm. No murmurs, gallops or rubs. Abdomen:Soft. Bowel sounds present. Non-tender.  Extremities: No lower extremity edema. Pulses are 2 + in the bilateral DP/PT.  EKG: NSR, rate 88 bpm. Non-specific T wave abnormality.   Echo 10/26/12: Left ventricle: LVEF is approximately 30% with diffuse hypokinesis. The cavity size was mildly dilated. Wall thickness was normal. - Right ventricle: Systolic function was mildly reduced.  Cardiac MRI 03/27/13: 1. Mildly dilated LV with moderate global hypokinesis, EF 40%. 2. Normal RV size and systolic function. 3. No myocardial delayed enhancement, so no definitive evidence for prior MI, infiltrative disease, or myocarditis.  Assessment and Plan:   1. Non-ischemic cardiomyopathy: Followed in past in [facility]. Known to have LV systolic dysfunction for years. Cath April 2012 with reported normal coronary arteries. She is on appropriate medical therapy. Repeat echo July 2014 with LVEF 30%. Cardiac MRI 03/27/13 with LVEF=40%. She is doing well. Continue current meds. We have once again discussed her wishes to become pregnant. She has spoken to Dr. 03/29/13 in the fertility clinic and is still considering fertility therapy. As in previous visits,  I have recommended that she not become pregnant at this time as it is high rik given her cardiomyopathy as well as connective tissue disease. She is adamant about proceeding. While high risk, I do not think pregnancy would be absolutely contraindicated. If she does go this route, will need to review all medications to assess safety while pregnant. She understands that becoming pregnant with connective tissue disease and a cardiomyopathy could be detrimental to her own health.

## 2013-07-02 ENCOUNTER — Other Ambulatory Visit (HOSPITAL_COMMUNITY)
Admission: RE | Admit: 2013-07-02 | Discharge: 2013-07-02 | Disposition: A | Discharge status: Home / Self Care | Payer: Medicare HMO | Source: Ambulatory Visit | Attending: Obstetrics and Gynecology | Admitting: Obstetrics and Gynecology

## 2013-07-02 ENCOUNTER — Other Ambulatory Visit: Payer: Self-pay | Admitting: Obstetrics and Gynecology

## 2013-07-02 DIAGNOSIS — Z1151 Encounter for screening for human papillomavirus (HPV): Secondary | ICD-10-CM | POA: Insufficient documentation

## 2013-07-02 DIAGNOSIS — Z124 Encounter for screening for malignant neoplasm of cervix: Secondary | ICD-10-CM | POA: Insufficient documentation

## 2013-07-02 DIAGNOSIS — R87810 Cervical high risk human papillomavirus (HPV) DNA test positive: Secondary | ICD-10-CM | POA: Insufficient documentation

## 2013-07-03 ENCOUNTER — Other Ambulatory Visit: Payer: Self-pay | Admitting: Family Medicine

## 2013-07-03 DIAGNOSIS — R0602 Shortness of breath: Secondary | ICD-10-CM

## 2013-07-04 ENCOUNTER — Ambulatory Visit (INDEPENDENT_AMBULATORY_CARE_PROVIDER_SITE_OTHER): Payer: Medicare HMO | Admitting: Internal Medicine

## 2013-07-04 DIAGNOSIS — R0602 Shortness of breath: Secondary | ICD-10-CM

## 2013-07-04 LAB — PULMONARY FUNCTION TEST
DL/VA % pred: 123 %
DL/VA: 5.6 ml/min/mmHg/L
DLCO UNC % PRED: 81 %
DLCO unc: 17.56 ml/min/mmHg
FEF 25-75 PRE: 2.26 L/s
FEF 25-75 Post: 2.04 L/sec
FEF2575-%Change-Post: -10 %
FEF2575-%Pred-Post: 77 %
FEF2575-%Pred-Pre: 85 %
FEV1-%CHANGE-POST: 0 %
FEV1-%Pred-Post: 74 %
FEV1-%Pred-Pre: 74 %
FEV1-PRE: 1.74 L
FEV1-Post: 1.73 L
FEV1FVC-%Change-Post: 1 %
FEV1FVC-%PRED-PRE: 102 %
FEV6-%Change-Post: 0 %
FEV6-%Pred-Post: 72 %
FEV6-%Pred-Pre: 72 %
FEV6-POST: 2.01 L
FEV6-PRE: 2.02 L
FEV6FVC-%CHANGE-POST: 0 %
FEV6FVC-%Pred-Post: 102 %
FEV6FVC-%Pred-Pre: 101 %
FVC-%Change-Post: -1 %
FVC-%Pred-Post: 70 %
FVC-%Pred-Pre: 71 %
FVC-Post: 2.01 L
FVC-Pre: 2.03 L
POST FEV6/FVC RATIO: 100 %
Post FEV1/FVC ratio: 86 %
Pre FEV1/FVC ratio: 85 %
Pre FEV6/FVC Ratio: 99 %
RV % pred: 89 %
RV: 1.36 L
TLC % PRED: 71 %
TLC: 3.37 L

## 2013-07-04 NOTE — Progress Notes (Signed)
PFT done today. 

## 2013-07-12 NOTE — Progress Notes (Signed)
Quick Note:  [id number] ______ 

## 2013-07-17 NOTE — Progress Notes (Signed)
Quick Note:  [id number] ______ 

## 2013-07-18 ENCOUNTER — Ambulatory Visit
Admission: RE | Admit: 2013-07-18 | Discharge: 2013-07-18 | Disposition: A | Discharge status: Home / Self Care | Payer: Commercial Managed Care - HMO | Source: Ambulatory Visit | Attending: Family Medicine | Admitting: Family Medicine

## 2013-07-18 ENCOUNTER — Other Ambulatory Visit: Payer: Self-pay | Admitting: Family Medicine

## 2013-07-18 DIAGNOSIS — R0989 Other specified symptoms and signs involving the circulatory and respiratory systems: Secondary | ICD-10-CM

## 2013-07-23 ENCOUNTER — Other Ambulatory Visit: Payer: Self-pay | Admitting: Family Medicine

## 2013-07-23 DIAGNOSIS — J90 Pleural effusion, not elsewhere classified: Secondary | ICD-10-CM

## 2013-07-31 ENCOUNTER — Encounter: Payer: Self-pay | Admitting: Internal Medicine

## 2013-07-31 ENCOUNTER — Ambulatory Visit (INDEPENDENT_AMBULATORY_CARE_PROVIDER_SITE_OTHER): Payer: Commercial Managed Care - HMO | Admitting: Internal Medicine

## 2013-07-31 VITALS — BP 142/70 | HR 73 | Temp 97.3°F | Ht 62.0 in | Wt 252.2 lb

## 2013-07-31 DIAGNOSIS — R918 Other nonspecific abnormal finding of lung field: Secondary | ICD-10-CM

## 2013-07-31 DIAGNOSIS — R05 Cough: Secondary | ICD-10-CM

## 2013-07-31 DIAGNOSIS — R059 Cough, unspecified: Secondary | ICD-10-CM

## 2013-07-31 DIAGNOSIS — J45909 Unspecified asthma, uncomplicated: Secondary | ICD-10-CM

## 2013-07-31 MED ORDER — MOXIFLOXACIN HCL 400 MG PO TABS: 400.0000 mg | ORAL_TABLET | Freq: Every day | ORAL | Status: DC

## 2013-07-31 MED ORDER — PROMETHAZINE-CODEINE 6.25-10 MG/5ML PO SYRP: 5.0000 mL | ORAL_SOLUTION | Freq: Four times a day (QID) | ORAL | Status: DC | PRN

## 2013-07-31 MED ORDER — PREDNISONE 10 MG PO TABS: ORAL_TABLET | ORAL | Status: DC

## 2013-07-31 NOTE — Progress Notes (Signed)
   Subjective:    Patient ID: [name], female    DOB: 03-19-1971 MRN: [id number]  HPI  43 yobf never smoker with RA x age 43 referred 07/31/2013 to pulmonary clinic by Shirlean Mylar for recurrent cough and sob x around early 2000s  but worse since Oct 2014  07/31/2013 1st [facility] office visit/ [name] on prednisone daily better on higher doses ? 3 pills daily Chief Complaint  Patient presents with  . Pulmonary Consult    Referred per Dr. Shirlean Mylar. The pt c/o SOB and cough x 6 months. Cough is prod with green/yellow/blood tinged sputum.    cough onset was insidious, present daily x 40m, worst after supper, some better with neb , better with percocet but can't sleep with it.  Sob at rest mostly linked to coughing, cough worse with exp to cig smoke  No obvious  Other patterns in day to day or daytime variabilty or assoc  Localized pleuritic or exertional cp or chest tightness, subjective wheeze.  Has both hearburn and sensation of nasal congestion. No unusual exp hx or h/o childhood pna/ asthma or knowledge of premature birth.   Also denies any obvious fluctuation of symptoms with weather or other environmental changes or other aggravating or alleviating factors except as outlined above   Current Medications, Allergies, Complete Past Medical History, Past Surgical History, Family History, and Social History were reviewed in [employer] record.             Review of Systems  Constitutional: Negative for fever, chills and unexpected weight change.  HENT: Positive for congestion and sore throat. Negative for dental problem, ear pain, nosebleeds, postnasal drip, rhinorrhea, sinus pressure, sneezing, trouble swallowing and voice change.   Eyes: Negative for visual disturbance.  Respiratory: Positive for cough and shortness of breath. Negative for choking.   Cardiovascular: Positive for leg swelling. Negative for chest pain.  Gastrointestinal: Negative for  vomiting, abdominal pain and diarrhea.  Genitourinary: Negative for difficulty urinating.       Heartburn Indigestion  Musculoskeletal: Positive for arthralgias.  Skin: Negative for rash.  Neurological: Positive for headaches. Negative for tremors and syncope.  Hematological: Does not bruise/bleed easily.       Objective:   Physical Exam  Wt Readings from Last 3 Encounters:  05/11/13 248 lb (112.492 kg)  01/24/13 247 lb (112.038 kg)  11/23/12 239 lb (108.41 kg)     amb hoarse slt cushingnoid obese bf nad  HEENT: nl dentition, turbinates, and orophanx. Nl external ear canals without cough reflex   NECK :  without JVD/Nodes/TM/ nl carotid upstrokes bilaterally   LUNGS: no acc muscle use, clear to A and P bilaterally without cough on insp or exp maneuvers   CV:  RRR  no s3 or murmur or increase in P2, no edema   ABD:  soft and nontender with nl excursion in the supine position. No bruits or organomegaly, bowel sounds nl  MS:  warm without deformities, calf tenderness, cyanosis or clubbing  SKIN: warm and dry without lesions    NEURO:  alert, approp, no deficits      CXR  07/31/2013 :  1. New pleural opacity at the right lung base may represent fluid,  possibly loculated and or pleural thickening with volume loss.  Consider CT of the chest if further assessment is warranted  clinically.  2. Peribronchial thickening most consistent with bronchitis.  3. Mild cardiomegaly.  Assessment & Plan:

## 2013-07-31 NOTE — Patient Instructions (Addendum)
Prednisone 10 mg 2 daily until better then 1 daily x 5 days and stop.  Avelox 400 mg one daily x 5 days and repeat x 5 days if mucus is still green/ yellow  Take nexium 40 mg Take 30-60 min before first meal of the day and pepcid ac 20 mg at bedtime until return to office  For cough > use the codeine cough syrup as needed / tramadol or percocet will also work  GERD (REFLUX)  is an extremely common cause of respiratory symptoms, many times with no significant heartburn at all.    It can be treated with medication, but also with lifestyle changes including avoidance of late meals, excessive alcohol, smoking cessation, and avoid fatty foods, chocolate, peppermint, colas, red wine, and acidic juices such as orange juice.  NO MINT OR MENTHOL PRODUCTS SO NO COUGH DROPS  USE SUGARLESS CANDY INSTEAD (jolley ranchers or Stover's)  NO OIL BASED VITAMINS - use powdered substitutes.    Please schedule a follow up office visit in 2 weeks with cxr and all medications and your list in hand to see Tammy NP who will generate a new/ updated accurate list for you Add Next step sinus and chest ct

## 2013-08-01 ENCOUNTER — Encounter: Payer: Self-pay | Admitting: Internal Medicine

## 2013-08-01 DIAGNOSIS — R918 Other nonspecific abnormal finding of lung field: Secondary | ICD-10-CM | POA: Insufficient documentation

## 2013-08-01 DIAGNOSIS — R059 Cough, unspecified: Secondary | ICD-10-CM | POA: Insufficient documentation

## 2013-08-01 DIAGNOSIS — R05 Cough: Secondary | ICD-10-CM | POA: Insufficient documentation

## 2013-08-01 NOTE — Assessment & Plan Note (Addendum)
In setting of ? RA/ lupus ddx includes low grade pneumonia or Lupus/ RA lung dz or even opportunistic infection related to rx   So far has received zpak with no change so re cavelox x 10 day and regroup in 2 weeks to consider CT chest   See instructions for specific recommendations which were reviewed directly with the patient who was given a copy with highlighter outlining the key components.

## 2013-08-01 NOTE — Assessment & Plan Note (Addendum)
Not clearly present but ok to rx with prn saba for now if she feels it helps  She is very confused with meds/ indications  To keep things simple, I have asked the patient to first separate medicines that are perceived as maintenance, that is to be taken daily "no matter what", from those medicines that are taken on only on an as-needed basis and I have given the patient examples of both, and then return to see our NP to generate a  detailed  medication calendar which should be followed until the next physician sees the patient and updates it.

## 2013-08-01 NOTE — Assessment & Plan Note (Signed)
It is possible that the cough and the infiltrates are not related to one another except possibly via sinus dz with pnds and asp of nasal contents into distal airways   rec Empiric rx for gerd while actively coughing Sinus ct if chest CT done

## 2013-08-14 ENCOUNTER — Encounter: Payer: Commercial Managed Care - HMO | Admitting: Adult Health

## 2013-11-23 ENCOUNTER — Encounter: Payer: Commercial Managed Care - HMO | Admitting: Adult Health

## 2014-09-21 ENCOUNTER — Other Ambulatory Visit: Payer: Self-pay | Admitting: Internal Medicine

## 2014-10-17 ENCOUNTER — Ambulatory Visit (INDEPENDENT_AMBULATORY_CARE_PROVIDER_SITE_OTHER): Payer: Commercial Managed Care - HMO | Admitting: Nurse Practitioner

## 2014-10-17 ENCOUNTER — Encounter: Payer: Self-pay | Admitting: Nurse Practitioner

## 2014-10-17 VITALS — BP 120/80 | HR 91 | Ht 61.0 in | Wt 249.0 lb

## 2014-10-17 DIAGNOSIS — I429 Cardiomyopathy, unspecified: Secondary | ICD-10-CM

## 2014-10-17 DIAGNOSIS — I428 Other cardiomyopathies: Secondary | ICD-10-CM

## 2014-10-17 NOTE — Progress Notes (Addendum)
CARDIOLOGY OFFICE NOTE  Date:  10/17/2014    [name] Date of Birth: [birth year] Medical Record #[id number]  PCP:  Frederich Chick, MD  Cardiologist:  [facility]    Chief Complaint  Patient presents with  . Congestive Heart Failure    1 1/2 year check - seen for Dr. Clifton James    History of Present Illness: [name] is a 44 y.o. female who presents today for a 1 1/2 year check. Seen for Dr. Clifton James. She has a history of RA, Lupus, non-ischemic cardiomyopathy, & pericardial effusion.  Dr. Mike Craze saw her as new patient 10/23/12 for a second opinion on her cardiac disease. She had been followed in the past by Dr. Armanda Magic in [facility].  Echo November 2013 with LVEF 30-35%, mild MR. Cath 2012 with normal coronary arteries. Repeat echo July 2013 with LVEF 30% with diffuse hypokinesis. The cavity size was mildly dilated. Wall thickness was normal. Right ventricular systolic function was mildly reduced.  Last seen in January of 2015. Felt to be doing well. Discussion about fertility/pregnancy - Dr. Clifton James explained that she would be high risk for a pregnancy due to her NICM and connective tissue disorder and he advised her to NOT pursue a pregnancy.   Comes in today. Here alone. Referred back by PCP for complaint of palpitations. No EKG sent. Can't lose weight. Says she has been going to the gym for 4 months and has had no weight loss. Some "light" palpitations - happens almost every day - do not seem to be symptomatic. More concerned about her weight. Blood sugar not controlled. A1C almost 8. Discussed her diet - her portions are too large, she loves sweets, etc. No chest pain. Breathing is ok. No swelling.   Past Medical History  Diagnosis Date  . Hypertension   . Rheumatoid arthritis(714.0)   . Herpes simplex of female genitalia   . Cardiomyopathy     Non-ischemic  . Lupus     Past Surgical History  Procedure Laterality Date  . Dental surgery    . Ovary  surgery       Medications: Current Outpatient Prescriptions  Medication Sig Dispense Refill  . acyclovir (ZOVIRAX) 200 MG capsule Take 200 mg by mouth daily.     Marland Kitchen albuterol (PROAIR HFA) 108 (90 BASE) MCG/ACT inhaler Inhale 2 puffs into the lungs every 6 (six) hours as needed for wheezing or shortness of breath.    Marland Kitchen aspirin 81 MG chewable tablet Chew 1 tablet (81 mg total) by mouth daily. 30 tablet 3  . esomeprazole (NEXIUM) 40 MG capsule Take 40 mg by mouth daily before breakfast.    . fluticasone (FLONASE) 50 MCG/ACT nasal spray Place 2 sprays into both nostrils daily.    . hydroxychloroquine (PLAQUENIL) 200 MG tablet Take 200 mg by mouth daily.     Marland Kitchen loratadine (CLARITIN) 10 MG tablet Take 10 mg by mouth daily.    Marland Kitchen losartan (COZAAR) 50 MG tablet Take 50 mg by mouth daily.    . metFORMIN (GLUCOPHAGE) 500 MG tablet Take 500 mg by mouth 2 (two) times daily with a meal.    . methocarbamol (ROBAXIN) 500 MG tablet Take 500 mg by mouth 2 (two) times daily as needed. Muscle spasms    . metoprolol succinate (TOPROL-XL) 25 MG 24 hr tablet Take 1 tablet (25 mg total) by mouth daily. 30 tablet 3  . potassium chloride (K-DUR) 10 MEQ tablet Take 1 tablet by mouth  daily.    . promethazine-codeine (PHENERGAN WITH CODEINE) 6.25-10 MG/5ML syrup Take 5 mLs by mouth every 6 (six) hours as needed for cough.    . SYMBICORT 160-4.5 MCG/ACT inhaler Inhale 2 puffs into the lungs at bedtime.    . traMADol (ULTRAM) 50 MG tablet Take 1 tablet (50 mg total) by mouth 4 (four) times daily. pain 60 tablet 1   No current facility-administered medications for this visit.    Allergies: Allergies  Allergen Reactions  . Clarithromycin     REACTION: upset stomach  . Hydrocodone     REACTION: nausea  . Ondansetron Itching  . Penicillins Swelling  . Tdap [Diphth-Acell Pertussis-Tetanus]     Social History: The patient  reports that she has never smoked. She has never used smokeless tobacco. She reports that she  does not drink alcohol or use illicit drugs.   Family History: The patient's family history includes Allergies in her mother; Breast cancer in her mother; CAD in her mother; Diabetes Mellitus II in an other family member; Heart attack in her mother; Hypertension in an other family member.   Review of Systems: Please see the history of present illness.   Otherwise, the review of systems is positive for none.   All other systems are reviewed and negative.   Physical Exam: VS:  BP 120/80 mmHg  Pulse 91  Ht 5\' 1"  (1.549 m)  Wt 249 lb (112.946 kg)  BMI 47.07 kg/m2 .  BMI Body mass index is 47.07 kg/(m^2).  Wt Readings from Last 3 Encounters:  10/17/14 249 lb (112.946 kg)  07/31/13 252 lb 3.2 oz (114.397 kg)  05/11/13 248 lb (112.492 kg)    General: Pleasant. She is morbidly obese but in no acute distress.  HEENT: Normal. Neck: Supple, no JVD, carotid bruits, or masses noted.  Cardiac: Regular rate and rhythm. No murmurs, rubs, or gallops. No edema.  Respiratory:  Lungs are clear to auscultation bilaterally with normal work of breathing.  GI: Soft and nontender.  MS: No deformity or atrophy. Gait and ROM intact. Skin: Warm and dry. Color is normal.  Neuro:  Strength and sensation are intact and no gross focal deficits noted.  Psych: Alert, appropriate and with normal affect.   LABORATORY DATA:  EKG:  EKG is ordered today. This demonstrates NSR with inferolateral ST changes .  Lab Results  Component Value Date   WBC 7.2 03/06/2012   HGB 10.3* 03/06/2012   HCT 33.0* 03/06/2012   PLT 423* 03/06/2012   GLUCOSE 164* 01/24/2013   CHOL 139 03/03/2012   TRIG 115 03/03/2012   HDL 40 03/03/2012   LDLCALC 76 03/03/2012   ALT 41* 03/06/2012   AST 56* 03/06/2012   NA 136 01/24/2013   K 3.3* 01/24/2013   CL 103 01/24/2013   CREATININE 0.46* 03/13/2013   BUN 11 01/24/2013   CO2 26 01/24/2013   TSH 2.943 03/03/2012   INR 1.11 06/15/2010   HGBA1C 6.0* 03/03/2012    BNP (last 3  results) No results for input(s): BNP in the last 8760 hours.  ProBNP (last 3 results) No results for input(s): PROBNP in the last 8760 hours.   Other Studies Reviewed Today: Echo Study Conclusions from 10/2012  - Left ventricle: LVEF is approximately 30% with diffuse hypokinesis. The cavity size was mildly dilated. Wall thickness was normal. - Right ventricle: Systolic function was mildly reduced.  Assessment/Plan: 1. Non-ischemic cardiomyopathy: Followed in past by [facility]. Known to have LV systolic dysfunction for  years. Cath April 2012 with reported normal coronary arteries. She is on appropriate medical therapy. Repeat echo July 2014 with LVEF 30%. Cardiac MRI 03/27/13 with LVEF=40%. Will get her echo updated. ? If we may need to consider repeat cardiac MRI. Will see what the holter shows and the echo.   2. Obesity - discussed at length  3. Palpitations - will place Holter. He symptoms are happening almost every day.   Further disposition to follow.   Current medicines are reviewed with the patient today.  The patient does not have concerns regarding medicines other than what has been noted above.  The following changes have been made:  See above.  Labs/ tests ordered today include:    Orders Placed This Encounter  Procedures  . Holter monitor - 24 hour  . EKG 12-Lead  . ECHOCARDIOGRAM COMPLETE     Disposition:   FU with Dr. Clifton James in 6 months.   Patient is agreeable to this plan and will call if any problems develop in the interim.   Signed: Rosalio Macadamia, RN, ANP-C 10/17/2014 9:10 AM  [facility] [address] [place], [place]  [address] Phone: [phone] Fax: [phone]      Addendum 10/23/2014 from Dr. Clifton James following reading of her echo [facility] Fiscal, It looks like her EF has been around 30% by echo for years. It sounds like she is NYHA class 1 so I don't think the EP guys would put in an  ICD. I would continue medical management for now. Thanks for seeing her. chris

## 2014-10-17 NOTE — Patient Instructions (Addendum)
We will be checking the following labs today - NONE   Medication Instructions:    Continue with your current medicines.     Testing/Procedures To Be Arranged:  Echocardiogram  Holter monitor for 24 hours  Follow-Up:   See Dr. Clifton James in 6 months unless studies are abnormal    Other Special Instructions:  Here are my tips to lose weight:  1. Drink only water. You do not need milk, juice, tea, soda or diet soda.  2. Do not eat anything "white". This includes white bread, potatoes, rice or mayo  3. Stay away from fried foods and sweets  4. Your portion should be the size of the palm of your hand.  5. Know what your weaknesses are and avoid.  6. Find an exercise you like and do it every day for 45 to 60 minutes.        Call the [facility] office at [phone] if you have any questions, problems or concerns.

## 2014-10-22 ENCOUNTER — Ambulatory Visit (HOSPITAL_COMMUNITY): Payer: Commercial Managed Care - HMO | Attending: Cardiology

## 2014-10-22 ENCOUNTER — Other Ambulatory Visit: Payer: Self-pay

## 2014-10-22 DIAGNOSIS — I429 Cardiomyopathy, unspecified: Secondary | ICD-10-CM

## 2014-10-22 DIAGNOSIS — I428 Other cardiomyopathies: Secondary | ICD-10-CM

## 2014-10-22 DIAGNOSIS — I059 Rheumatic mitral valve disease, unspecified: Secondary | ICD-10-CM | POA: Diagnosis not present

## 2014-10-25 ENCOUNTER — Telehealth: Payer: Self-pay | Admitting: Nurse Practitioner

## 2014-10-25 NOTE — Telephone Encounter (Signed)
New Message    Pt calling to speak to Danielle per Duwayne Heck she will call pt back

## 2014-10-28 ENCOUNTER — Other Ambulatory Visit: Payer: Self-pay | Admitting: Internal Medicine

## 2014-10-28 NOTE — Telephone Encounter (Signed)
Follow Up ° ° ° ° ° °Pt returning Danielle's phone call. °

## 2014-10-31 DIAGNOSIS — R002 Palpitations: Secondary | ICD-10-CM | POA: Insufficient documentation

## 2014-10-31 DIAGNOSIS — I428 Other cardiomyopathies: Secondary | ICD-10-CM | POA: Insufficient documentation

## 2014-11-04 ENCOUNTER — Ambulatory Visit (INDEPENDENT_AMBULATORY_CARE_PROVIDER_SITE_OTHER): Payer: Commercial Managed Care - HMO

## 2014-11-04 DIAGNOSIS — I429 Cardiomyopathy, unspecified: Secondary | ICD-10-CM | POA: Diagnosis not present

## 2014-11-04 DIAGNOSIS — R002 Palpitations: Secondary | ICD-10-CM | POA: Diagnosis not present

## 2014-11-04 DIAGNOSIS — I428 Other cardiomyopathies: Secondary | ICD-10-CM

## 2014-11-07 ENCOUNTER — Telehealth: Payer: Self-pay | Admitting: Cardiovascular Disease

## 2014-11-07 DIAGNOSIS — I428 Other cardiomyopathies: Secondary | ICD-10-CM

## 2014-11-07 NOTE — Telephone Encounter (Signed)
Spoke with pt. She is calling for results of holter monitor.  I told pt results are not available yet and we would call her when available.

## 2014-11-07 NOTE — Telephone Encounter (Signed)
°  Follow Up ° °Pt is calling following up on test results. Please call. °

## 2014-11-11 ENCOUNTER — Telehealth: Payer: Self-pay | Admitting: Cardiovascular Disease

## 2014-11-11 NOTE — Telephone Encounter (Signed)
New message ° ° ° ° °Pt returning your call °

## 2014-11-11 NOTE — Telephone Encounter (Signed)
We can increase her Toprol to 50 mg daily. Thayer Ohm

## 2014-11-11 NOTE — Telephone Encounter (Signed)
Spoke with pt and reviewed monitor results with her.  She reports she still has feelings of palpitations every 5 minutes. These are worse at night and very bothersome to her.  Is asking if Dr. Clifton James has any suggestions.  She limits caffeine to tea and coffee once per week.  Not using inhalers daily.   Is also concerned about how she can lose weight while watching fluid intake.  Is not exercising at present because she did not lose weight when she last tried to exercise.  Trying to avoid fried foods and white foods as instructed at last office visit.  I asked her to follow up with primary care for additional weight loss recommendations and possible referral to nutritionist. Will forward to Dr. Clifton James regarding continuing complaints of palpitations

## 2014-11-11 NOTE — Telephone Encounter (Signed)
Left message for pt to call back  °

## 2014-11-11 NOTE — Telephone Encounter (Signed)
Left message to call back  

## 2014-11-11 NOTE — Telephone Encounter (Signed)
See previous phone note that addresses this call

## 2014-11-12 NOTE — Telephone Encounter (Signed)
Left message to call back  

## 2014-11-13 ENCOUNTER — Telehealth: Payer: Self-pay | Admitting: Cardiovascular Disease

## 2014-11-13 MED ORDER — FUROSEMIDE 40 MG PO TABS: 40.0000 mg | ORAL_TABLET | Freq: Every day | ORAL | Status: DC

## 2014-11-13 MED ORDER — METOPROLOL SUCCINATE ER 50 MG PO TB24: 50.0000 mg | ORAL_TABLET | Freq: Every day | ORAL | Status: DC

## 2014-11-13 NOTE — Telephone Encounter (Signed)
Left message to call back.  See phone call dated 7/28 that addresses the reason for this call.

## 2014-11-13 NOTE — Telephone Encounter (Signed)
I spoke with pt and gave her instructions from Dr. Clifton James. She will come in for [facility] on November 19, 2014. Will send prescription to [facility] on [address].

## 2014-11-13 NOTE — Telephone Encounter (Signed)
New message ° ° ° ° °Pt returning your call °

## 2014-11-13 NOTE — Telephone Encounter (Signed)
Spoke with [name] and gave her instructions to increase Toprol to 50 mg by mouth daily.  Will send prescription to [facility] on [address].   [name] reports swelling in legs/ankles. This has been going on for 6 months. Has not worsened since she saw Norma Fredrickson, NP in July. [name] is not having shortness of breath. Does not weigh daily. Swelling OK in the morning but worsens as day goes on.  I instructed [name] to weigh daily and let us know if weight increases. I also instructed [name] to keep legs elevated as much as possible.  [name] is asking if she needs to be on a fluid medication.  States she was on for a short term in the past when she saw her previous cardiologist. Will send to Dr. Clifton James for recommendations.

## 2014-11-13 NOTE — Telephone Encounter (Signed)
Would start lasix 40 mg daily with f/u BMET in 5 days. Thayer Ohm

## 2014-11-19 ENCOUNTER — Other Ambulatory Visit (INDEPENDENT_AMBULATORY_CARE_PROVIDER_SITE_OTHER): Payer: Commercial Managed Care - HMO | Admitting: *Deleted

## 2014-11-19 DIAGNOSIS — I429 Cardiomyopathy, unspecified: Secondary | ICD-10-CM

## 2014-11-19 DIAGNOSIS — I428 Other cardiomyopathies: Secondary | ICD-10-CM

## 2014-11-19 LAB — BASIC METABOLIC PANEL
BUN: 13 mg/dL (ref 6–23)
CO2: 28 mEq/L (ref 19–32)
CREATININE: 0.59 mg/dL (ref 0.40–1.20)
Calcium: 8.4 mg/dL (ref 8.4–10.5)
Chloride: 103 mEq/L (ref 96–112)
GFR: 143.31 mL/min (ref >60.00)
GLUCOSE: 164 mg/dL — AB (ref 70–99)
Potassium: 3.6 mEq/L (ref 3.5–5.1)
SODIUM: 138 meq/L (ref 135–145)

## 2014-11-20 ENCOUNTER — Telehealth: Payer: Self-pay | Admitting: Cardiovascular Disease

## 2014-11-20 MED ORDER — POTASSIUM CHLORIDE CRYS ER 20 MEQ PO TBCR: 20.0000 meq | EXTENDED_RELEASE_TABLET | Freq: Every day | ORAL | Status: DC

## 2014-11-20 NOTE — Telephone Encounter (Signed)
Informed pt of lab results and new order to increase K+ to QD. Pt verbalized understanding and was in agreement with this plan.

## 2014-11-20 NOTE — Telephone Encounter (Signed)
New message      Pt is returning a nurses call to get lab results

## 2014-11-26 ENCOUNTER — Encounter: Payer: Commercial Managed Care - HMO | Attending: Family Medicine | Admitting: Dietician

## 2014-11-26 ENCOUNTER — Encounter: Payer: Self-pay | Admitting: Dietician

## 2014-11-26 VITALS — Ht 61.0 in | Wt 249.0 lb

## 2014-11-26 DIAGNOSIS — Z713 Dietary counseling and surveillance: Secondary | ICD-10-CM | POA: Diagnosis not present

## 2014-11-26 DIAGNOSIS — I509 Heart failure, unspecified: Secondary | ICD-10-CM

## 2014-11-26 DIAGNOSIS — I428 Other cardiomyopathies: Secondary | ICD-10-CM

## 2014-11-26 DIAGNOSIS — E785 Hyperlipidemia, unspecified: Secondary | ICD-10-CM | POA: Diagnosis not present

## 2014-11-26 DIAGNOSIS — I159 Secondary hypertension, unspecified: Secondary | ICD-10-CM

## 2014-11-26 DIAGNOSIS — E118 Type 2 diabetes mellitus with unspecified complications: Secondary | ICD-10-CM | POA: Insufficient documentation

## 2014-11-26 DIAGNOSIS — Z6841 Body Mass Index (BMI) 40.0 and over, adult: Secondary | ICD-10-CM

## 2014-11-26 NOTE — Patient Instructions (Addendum)
Recommend no caffeine (regular coffee, iced tea, cola) Never use diet pills! Consider choosing more non starchy vegetables. Recommend choosing water or drinks with no sugar. Fluid restrictions per MD.  5-6 cups daily. Avoid salt and high sodium foods such as processed meat (ham, bacon, sausage, lunch meat, canned vegetables, canned soup) Try the Mrs. Dash seasoning packets and other salt free seasonings.  Only use lite salt or No salt if approved by your doctor. Protein in moderation with each meal and snack. Aim for breakfast, lunch, dinner daily. Read the labels for sodium.  Aim for no more than 2000 mg per day.

## 2014-11-26 NOTE — Progress Notes (Signed)
Medical Nutrition Therapy:  Appt start time: 0900 end time:  1015.   Assessment:  Primary concerns today: Patient is here alone.  He has a hx that includes dilated cardiomyopathy, hyperlipidemia, type 2 diabetes (47yrs), RA, asthma and allergies.  Patient is obese and states that bariatric surgery was recommended but that her insurance will not cover this.  Her last HgbA1C was 8.0% in 2016.  She checks her blood sugar every other day and am reading is 117 and 1 hour post meal is 160.  She states that she is concerned about how much to exercise, fluid restrictions, and desire to lose weight.    Patient lives with her mother. Patient does her own shopping and cooking.  Mother purchases many high sodium items and prepares high sodium foods.  She does not work.  She sleeps 6-7 hours per night with short 30 minute naps occasionally.  Preferred Learning Style:   No preference indicated   Learning Readiness:   Ready  MEDICATIONS: see list to include Metformin, lasix, and KCL  She often takes Metformin on an empty stomach.  DIETARY INTAKE: Patient is lactose intolerant.  "I don't use salt."  Meals are irregular.  Patient with decreased appetite until night. 24-hr recall:  B (11 AM): 2 strips bacon  Snk ( AM):   L (4 PM): baked chicken, vegetable, fruit Snk ( PM):  D ( PM):  Snk ( PM): fruit, cheese and crackers, dessert 1-2 times per month OR cereal with almond milk Beverages: water. Usually decaf coffee once a week, occasionally regular coffee with cream and 1 T sugar or splenda., diet gingerale, regular sweet tea  Usual physical activity: ADL's.  Patient was going to the gym for 4 months and was discouraged when she did not lose weight and then quit when she began having heart palpitations.  Estimated energy needs: 1400 calories 158 g carbohydrates 105 g protein 39 g fat  Progress Towards Goal(s):  In progress.   Nutritional Diagnosis:  NB-1.1 Food and nutrition-related knowledge  deficit As related to fluid limits/needs, balance of carbohydrate, protein, and fat.  As evidenced by patient report and diet hx..    Intervention:  Nutrition counseling/education regarding a low sodium diet avoiding caffeine.  Discussed label reading and meal planning.  Fluid limits discussed.  Patient was not drinking even to the minimal limit of a restriction.  She is weighing herself daily but questions the accuracy of the scale.  Discussed importance of balanced meals, meal timing and good nutrition.  Will need to focus on diabetes and diet, and label reading further in another visit.  Recommend no caffeine (regular coffee, iced tea, cola) Never use diet pills! Consider choosing more non starchy vegetables. Recommend choosing water or drinks with no sugar. Fluid restrictions per MD.  5-6 cups daily. Avoid salt and high sodium foods such as processed meat (ham, bacon, sausage, lunch meat, canned vegetables, canned soup) Try the Mrs. Dash seasoning packets and other salt free seasonings.  Only use lite salt or No salt if approved by your doctor. Protein in moderation with each meal and snack. Aim for breakfast, lunch, dinner daily. Read the labels for sodium.  Aim for no more than 2000 mg per day.   Teaching Method Utilized:  Visual Auditory Hands on  Handouts given during visit include:  Breakfast  My plate  Low sodium nutrition therapy  Low sodium seasoning  Barriers to learning/adherence to lifestyle change: none  Demonstrated degree of understanding via:  Teach  Back   Monitoring/Evaluation:  Dietary intake, exercise, and body weight in 2 week(s).

## 2014-12-10 ENCOUNTER — Ambulatory Visit: Payer: Commercial Managed Care - HMO | Admitting: Dietician

## 2014-12-23 ENCOUNTER — Ambulatory Visit: Payer: Commercial Managed Care - HMO | Admitting: Dietician

## 2014-12-31 ENCOUNTER — Encounter: Payer: Commercial Managed Care - HMO | Attending: Family Medicine | Admitting: Dietician

## 2014-12-31 ENCOUNTER — Encounter: Payer: Self-pay | Admitting: Dietician

## 2014-12-31 VITALS — Wt 250.5 lb

## 2014-12-31 DIAGNOSIS — Z713 Dietary counseling and surveillance: Secondary | ICD-10-CM | POA: Insufficient documentation

## 2014-12-31 DIAGNOSIS — I509 Heart failure, unspecified: Secondary | ICD-10-CM

## 2014-12-31 DIAGNOSIS — E118 Type 2 diabetes mellitus with unspecified complications: Secondary | ICD-10-CM

## 2014-12-31 DIAGNOSIS — E785 Hyperlipidemia, unspecified: Secondary | ICD-10-CM | POA: Diagnosis not present

## 2014-12-31 NOTE — Patient Instructions (Signed)
Don't skip meals. Please do not use salt. Avoid fast foods and processed foods.   Do not drink carbohydrates.  No regular soda, sweet tea or added sugar.  Avoid juice.  Meal ideas: Spanish scrambled eggs and fruit or boiled egg and fruit or grits and scrambled egg and fruit. Homemade chicken and vegetable soup and grilled low sodium swiss cheese sandwich [place] sandwich on thin buns and fruit Chicken with steamed veges.

## 2014-12-31 NOTE — Progress Notes (Signed)
Medical Nutrition Therapy:  Appt start time: 1610 end time:  1300.   Assessment:  11/26/14: Primary concerns today: Patient is here alone.  He has a hx that includes dilated cardiomyopathy, hyperlipidemia, type 2 diabetes (56yr), RA, asthma and allergies.  Patient is obese and states that bariatric surgery was recommended but that her insurance will not cover this.  Her last HgbA1C was 8.0% in 2016.  She checks her blood sugar every other day and am reading is 117 and 1 hour post meal is 160.  She states that she is concerned about how much to exercise, fluid restrictions, and desire to lose weight.    Patient lives with her mother and brother. Patient does her own shopping and cooking.  Mother purchases many high sodium items and prepares high sodium foods.  She does not work.  She sleeps 6-7 hours per night with short 30 minute naps occasionally.  12/31/14: Patient is here alone.  She states that it is difficult avoiding sodium but cooks without added salt.  She states that she has become more aware of what foods contain sodium but diet continues to be very high in sodium.  She states that she met the goal of 2000 mg twice.  She stated that when she did this she felt much better and her legs did not swell.  She goes to bed around 3 am.  Wt Readings from Last 3 Encounters:  12/31/14 250 lb 8 oz (113.626 kg)  11/26/14 249 lb (112.946 kg)  10/17/14 249 lb (112.946 kg)   TANITA  BODY COMP RESULTS 12/31/14 250.5 lbs   BMI (kg/m^2) 47.3   Fat Mass (lbs) 130   Fat Free Mass (lbs) 120.5   Total Body Water (lbs) 88   Preferred Learning Style:   No preference indicated   Learning Readiness:   Ready  MEDICATIONS: see list to include Metformin, lasix, and KCL  She often takes Metformin on an empty stomach.  DIETARY INTAKE: Patient is lactose intolerant.  "I don't use salt."  Meals are irregular.  Patient with decreased appetite until night. 24-hr recall:  B (11 AM): skipped except 16 oz  juice OR brother brings her a steak biscuit from [employer]  Snk ( AM):  banana L (3 PM): sandwich (processed meat) on white bread with regular or diet drink Snk (8 PM): banana or other fruit D ( PM):  Snk (1AM): ice cream OR Hers[employer]'s mini OR fruit, cheese and crackers,   Beverages:  Regular or diet soda, water. Usually decaf coffee once a week, occasionally regular coffee with cream and 1 T sugar or splenda., diet gingerale, regular sweet tea  Usual physical activity: ADL's.  Patient was going to the gym for 4 months and was discouraged when she did not lose weight and then quit when she began having heart palpitations.  Patient states that palpitations are getting stronger.  Estimated energy needs: 1400 calories 158 g carbohydrates 105 g protein 39 g fat  Progress Towards Goal(s):  In progress.   Nutritional Diagnosis:  NB-1.1 Food and nutrition-related knowledge deficit As related to fluid limits/needs, balance of carbohydrate, protein, and fat.  As evidenced by patient report and diet hx..    Intervention:  Nutrition counseling/education continues  regarding a low sodium diet avoiding caffeine.  Discussed  meal planning and basic recommendations of a diabetic diet.    11/26/14:   Recommend no caffeine (regular coffee, iced tea, cola) Never use diet pills! Consider choosing more non starchy vegetables. Recommend  choosing water or drinks with no sugar. Fluid restrictions per MD.  5-6 cups daily. Avoid salt and high sodium foods such as processed meat (ham, bacon, sausage, lunch meat, canned vegetables, canned soup) Try the Mrs. Dash seasoning packets and other salt free seasonings.  Only use lite salt or No salt if approved by your doctor. Protein in moderation with each meal and snack. Aim for breakfast, lunch, dinner daily. Read the labels for sodium.  Aim for no more than 2000 mg per day.  12/31/14: Don't skip meals. Please do not use salt. Avoid fast foods and processed  foods.   Do not drink carbohydrates.  No regular soda, sweet tea or added sugar.  Avoid juice.  Meal ideas: Spanish scrambled eggs and fruit or boiled egg and fruit or grits and scrambled egg and fruit. Homemade chicken and vegetable soup and grilled low sodium swiss cheese sandwich [place] sandwich on thin buns and fruit Chicken with steamed veges.  Teaching Method Utilized:  Visual Auditory Hands on  Handouts given during visit include:  11/26/14  Breakfast  My plate  Low sodium nutrition therapy  Low sodium seasoning  Barriers to learning/adherence to lifestyle change: none  Demonstrated degree of understanding via:  Teach Back   Monitoring/Evaluation:  Dietary intake, exercise, and body weight in 1 month.

## 2015-01-28 ENCOUNTER — Ambulatory Visit: Payer: Commercial Managed Care - HMO | Admitting: Dietician

## 2015-04-16 DIAGNOSIS — M2550 Pain in unspecified joint: Secondary | ICD-10-CM | POA: Diagnosis not present

## 2015-04-16 DIAGNOSIS — R5383 Other fatigue: Secondary | ICD-10-CM | POA: Diagnosis not present

## 2015-04-16 DIAGNOSIS — M0579 Rheumatoid arthritis with rheumatoid factor of multiple sites without organ or systems involvement: Secondary | ICD-10-CM | POA: Diagnosis not present

## 2015-04-16 DIAGNOSIS — Z79899 Other long term (current) drug therapy: Secondary | ICD-10-CM | POA: Diagnosis not present

## 2015-04-25 DIAGNOSIS — J4530 Mild persistent asthma, uncomplicated: Secondary | ICD-10-CM | POA: Diagnosis not present

## 2015-05-21 DIAGNOSIS — M0579 Rheumatoid arthritis with rheumatoid factor of multiple sites without organ or systems involvement: Secondary | ICD-10-CM | POA: Diagnosis not present

## 2015-05-21 DIAGNOSIS — Z79899 Other long term (current) drug therapy: Secondary | ICD-10-CM | POA: Diagnosis not present

## 2015-05-21 DIAGNOSIS — M2550 Pain in unspecified joint: Secondary | ICD-10-CM | POA: Diagnosis not present

## 2015-05-21 DIAGNOSIS — R5383 Other fatigue: Secondary | ICD-10-CM | POA: Diagnosis not present

## 2015-05-26 ENCOUNTER — Encounter: Payer: Self-pay | Admitting: Cardiovascular Disease

## 2015-05-26 ENCOUNTER — Ambulatory Visit (INDEPENDENT_AMBULATORY_CARE_PROVIDER_SITE_OTHER): Payer: PPO | Admitting: Cardiovascular Disease

## 2015-05-26 VITALS — BP 100/66 | HR 76 | Ht 61.0 in | Wt 253.1 lb

## 2015-05-26 DIAGNOSIS — I429 Cardiomyopathy, unspecified: Secondary | ICD-10-CM | POA: Diagnosis not present

## 2015-05-26 DIAGNOSIS — I493 Ventricular premature depolarization: Secondary | ICD-10-CM | POA: Diagnosis not present

## 2015-05-26 DIAGNOSIS — I428 Other cardiomyopathies: Secondary | ICD-10-CM

## 2015-05-26 NOTE — Patient Instructions (Signed)

## 2015-05-26 NOTE — Progress Notes (Signed)
Chief Complaint  Patient presents with  . Follow-up     History of Present Illness: 45 yo female with history of RA, non-ischemic cardiomyopathy, pericardial effusion who is here today for cardiac follow up. I saw her as new patient 10/23/12 for a second opinion on her cardiac disease. She has been followed in the past by Dr. Armanda Magic. She was told several years ago that her heart muscle was weak. Echo November 2013 with LVEF 30-35%, mild MR. Cath 2012 with normal coronary arteries. She had done well with no symptoms and was planning on pursuing in-vitro fertilization and she wanted my opinion on doing this. ABI normal 5/13. Venous dopplers June 2013 with no DVT. I advised against pregnancy. She was seen by Norma Fredrickson, NP July 2016 with c/o palpitations and dyspnea. Echo 10/22/14 with LVEF=25-30%. Cardiac monitor with PACs, PVCs.   She is here today for follow up. She is feeling well. No chest pain, SOB, orthopnea, PND. No LE edema. Rare palpitations.   Primary Care Physician: [facility]  Past Medical History  Diagnosis Date  . Hypertension   . Rheumatoid arthritis(714.0)   . Herpes simplex of female genitalia   . Cardiomyopathy     Non-ischemic  . Diabetes mellitus without complication (HCC)   . Allergy   . Asthma     Past Surgical History  Procedure Laterality Date  . Dental surgery    . Ovary surgery      Current Outpatient Prescriptions  Medication Sig Dispense Refill  . acyclovir (ZOVIRAX) 200 MG capsule Take 200 mg by mouth daily.     Marland Kitchen albuterol (PROAIR HFA) 108 (90 BASE) MCG/ACT inhaler Inhale 2 puffs into the lungs every 6 (six) hours as needed for wheezing or shortness of breath.    Marland Kitchen aspirin 81 MG chewable tablet Chew 1 tablet (81 mg total) by mouth daily. 30 tablet 3  . esomeprazole (NEXIUM) 40 MG capsule Take 40 mg by mouth daily before breakfast.    . fluticasone (FLONASE) 50 MCG/ACT nasal spray Place 2 sprays into both nostrils daily.    . furosemide  (LASIX) 40 MG tablet Take 1 tablet (40 mg total) by mouth daily. 30 tablet 6  . hydroxychloroquine (PLAQUENIL) 200 MG tablet Take 200 mg by mouth daily.     Marland Kitchen loratadine (CLARITIN) 10 MG tablet Take 10 mg by mouth daily.    Marland Kitchen losartan (COZAAR) 50 MG tablet Take 50 mg by mouth daily.    . metFORMIN (GLUCOPHAGE) 500 MG tablet Take 500 mg by mouth 2 (two) times daily with a meal.    . methocarbamol (ROBAXIN) 500 MG tablet Take 500 mg by mouth 2 (two) times daily as needed. Muscle spasms    . metoprolol succinate (TOPROL-XL) 50 MG 24 hr tablet Take 1 tablet (50 mg total) by mouth daily. 30 tablet 6  . potassium chloride SA (K-DUR,KLOR-CON) 20 MEQ tablet Take 1 tablet (20 mEq total) by mouth daily. 90 tablet 3  . promethazine-codeine (PHENERGAN WITH CODEINE) 6.25-10 MG/5ML syrup Take 5 mLs by mouth every 6 (six) hours as needed for cough.    . SYMBICORT 160-4.5 MCG/ACT inhaler Inhale 2 puffs into the lungs at bedtime.    . traMADol (ULTRAM) 50 MG tablet Take 1 tablet (50 mg total) by mouth 4 (four) times daily. pain 60 tablet 1   No current facility-administered medications for this visit.    Allergies  Allergen Reactions  . Clarithromycin     REACTION: upset stomach  .  Hydrocodone     REACTION: nausea  . Ondansetron Itching  . Penicillins Swelling  . Tdap [Diphth-Acell Pertussis-Tetanus]     Social History   Social History  . Marital Status: Single    Spouse Name: N/A  . Number of Children: 0  . Years of Education: N/A   Occupational History  . Disabled    Social History Main Topics  . Smoking status: Never Smoker   . Smokeless tobacco: Never Used  . Alcohol Use: No  . Drug Use: No  . Sexual Activity: Not on file   Other Topics Concern  . Not on file   Social History Narrative    Family History  Problem Relation Age of Onset  . Hypertension    . Diabetes Mellitus II    . Heart attack Mother   . CAD Mother   . Allergies Mother   . Breast cancer Mother     Review  of Systems:  As stated in the HPI and otherwise negative.   BP 100/66 mmHg  Pulse 76  Ht 5\' 1"  (1.549 m)  Wt 253 lb 1.9 oz (114.814 kg)  BMI 47.85 kg/m2  SpO2 99%  Physical Examination: General: Well developed, well nourished, NAD HEENT: OP clear, mucus membranes moist SKIN: warm, dry. No rashes. Neuro: No focal deficits Musculoskeletal: Muscle strength 5/5 all ext Psychiatric: Mood and affect normal Neck: No JVD, no carotid bruits, no thyromegaly, no lymphadenopathy. Lungs:Clear bilaterally, no wheezes, rhonci, crackles Cardiovascular: Regular rate and rhythm. No murmurs, gallops or rubs. Abdomen:Soft. Bowel sounds present. Non-tender.  Extremities: No lower extremity edema. Pulses are 2 + in the bilateral DP/PT.  Echo 10/22/14: Left ventricle: The cavity size was mildly dilated. Systolic function was severely reduced. The estimated ejection fraction was in the range of 25% to 30%. Diffuse hypokinesis. - Mitral valve: Calcified annulus. - Left atrium: The atrium was mildly dilated.  Cardiac MRI 03/27/13: 1. Mildly dilated LV with moderate global hypokinesis, EF 40%. 2. Normal RV size and systolic function. 3. No myocardial delayed enhancement, so no definitive evidence for prior MI, infiltrative disease, or myocarditis.  EKG:  EKG is not ordered today. The ekg ordered today demonstrates   Recent Labs: 11/19/2014: BUN 13; Creatinine, Ser 0.59; Potassium 3.6; Sodium 138   Lipid Panel    Component Value Date/Time   CHOL 139 03/03/2012 1215   TRIG 115 03/03/2012 1215   HDL 40 03/03/2012 1215   CHOLHDL 3.5 03/03/2012 1215   VLDL 23 03/03/2012 1215   LDLCALC 76 03/03/2012 1215     Wt Readings from Last 3 Encounters:  05/26/15 253 lb 1.9 oz (114.814 kg)  12/31/14 250 lb 8 oz (113.626 kg)  11/26/14 249 lb (112.946 kg)     Other studies Reviewed: Additional studies/ records that were reviewed today include: . Review of the above records demonstrates:    Assessment and Plan:   1. Non-ischemic cardiomyopathy: She is known to have LV systolic dysfunction for years. Cath April 2012 with reported normal coronary arteries. She is on appropriate medical therapy. Repeat echo July 2016 with LVEF 30%. Cardiac MRI 03/27/13 with LVEF=40%.   2. PVC/PAC: Rare palpitations. Continue beta blocker  Current medicines are reviewed at length with the patient today.  The patient does not have concerns regarding medicines.  The following changes have been made:  no change  Labs/ tests ordered today include:  No orders of the defined types were placed in this encounter.    Disposition:   FU  with me in 12  months  Signed, Verne Carrow, MD 05/26/2015 4:55 PM    [facility] [address] [place], [place], [place]  [address] Phone: [phone]; Fax: [phone]

## 2015-06-13 DIAGNOSIS — M2550 Pain in unspecified joint: Secondary | ICD-10-CM | POA: Diagnosis not present

## 2015-06-13 DIAGNOSIS — M0579 Rheumatoid arthritis with rheumatoid factor of multiple sites without organ or systems involvement: Secondary | ICD-10-CM | POA: Diagnosis not present

## 2015-06-13 DIAGNOSIS — Z79899 Other long term (current) drug therapy: Secondary | ICD-10-CM | POA: Diagnosis not present

## 2015-06-16 DIAGNOSIS — M79674 Pain in right toe(s): Secondary | ICD-10-CM | POA: Diagnosis not present

## 2015-06-16 DIAGNOSIS — J329 Chronic sinusitis, unspecified: Secondary | ICD-10-CM | POA: Diagnosis not present

## 2015-06-27 DIAGNOSIS — L270 Generalized skin eruption due to drugs and medicaments taken internally: Secondary | ICD-10-CM | POA: Diagnosis not present

## 2015-06-27 DIAGNOSIS — T7840XA Allergy, unspecified, initial encounter: Secondary | ICD-10-CM | POA: Diagnosis not present

## 2015-07-01 DIAGNOSIS — L659 Nonscarring hair loss, unspecified: Secondary | ICD-10-CM | POA: Diagnosis not present

## 2015-07-23 DIAGNOSIS — E119 Type 2 diabetes mellitus without complications: Secondary | ICD-10-CM | POA: Diagnosis not present

## 2015-07-23 DIAGNOSIS — E785 Hyperlipidemia, unspecified: Secondary | ICD-10-CM | POA: Diagnosis not present

## 2015-07-23 DIAGNOSIS — Z7984 Long term (current) use of oral hypoglycemic drugs: Secondary | ICD-10-CM | POA: Diagnosis not present

## 2015-07-23 DIAGNOSIS — Z0000 Encounter for general adult medical examination without abnormal findings: Secondary | ICD-10-CM | POA: Diagnosis not present

## 2015-07-23 DIAGNOSIS — J329 Chronic sinusitis, unspecified: Secondary | ICD-10-CM | POA: Diagnosis not present

## 2015-09-01 DIAGNOSIS — J309 Allergic rhinitis, unspecified: Secondary | ICD-10-CM | POA: Diagnosis not present

## 2015-09-01 DIAGNOSIS — J4540 Moderate persistent asthma, uncomplicated: Secondary | ICD-10-CM | POA: Diagnosis not present

## 2015-09-01 DIAGNOSIS — H1045 Other chronic allergic conjunctivitis: Secondary | ICD-10-CM | POA: Diagnosis not present

## 2015-09-01 DIAGNOSIS — J300 Vasomotor rhinitis: Secondary | ICD-10-CM | POA: Diagnosis not present

## 2015-11-17 ENCOUNTER — Other Ambulatory Visit (HOSPITAL_COMMUNITY)
Admission: RE | Admit: 2015-11-17 | Discharge: 2015-11-17 | Disposition: A | Discharge status: Home / Self Care | Payer: PPO | Source: Ambulatory Visit | Attending: Obstetrics and Gynecology | Admitting: Obstetrics and Gynecology

## 2015-11-17 ENCOUNTER — Other Ambulatory Visit: Payer: Self-pay | Admitting: Obstetrics and Gynecology

## 2015-11-17 DIAGNOSIS — Z9189 Other specified personal risk factors, not elsewhere classified: Secondary | ICD-10-CM | POA: Diagnosis not present

## 2015-11-17 DIAGNOSIS — Z01419 Encounter for gynecological examination (general) (routine) without abnormal findings: Secondary | ICD-10-CM | POA: Diagnosis not present

## 2015-11-17 DIAGNOSIS — Z1151 Encounter for screening for human papillomavirus (HPV): Secondary | ICD-10-CM | POA: Insufficient documentation

## 2015-11-17 DIAGNOSIS — M5432 Sciatica, left side: Secondary | ICD-10-CM | POA: Diagnosis not present

## 2015-11-17 DIAGNOSIS — Z01411 Encounter for gynecological examination (general) (routine) with abnormal findings: Secondary | ICD-10-CM | POA: Diagnosis not present

## 2015-11-17 DIAGNOSIS — R8789 Other abnormal findings in specimens from female genital organs: Secondary | ICD-10-CM | POA: Diagnosis not present

## 2015-11-17 DIAGNOSIS — N760 Acute vaginitis: Secondary | ICD-10-CM | POA: Insufficient documentation

## 2015-11-17 DIAGNOSIS — A6004 Herpesviral vulvovaginitis: Secondary | ICD-10-CM | POA: Diagnosis not present

## 2015-11-17 DIAGNOSIS — M069 Rheumatoid arthritis, unspecified: Secondary | ICD-10-CM | POA: Diagnosis not present

## 2015-11-19 LAB — CYTOLOGY - PAP

## 2015-12-16 DIAGNOSIS — M2550 Pain in unspecified joint: Secondary | ICD-10-CM | POA: Diagnosis not present

## 2015-12-16 DIAGNOSIS — M0579 Rheumatoid arthritis with rheumatoid factor of multiple sites without organ or systems involvement: Secondary | ICD-10-CM | POA: Diagnosis not present

## 2015-12-16 DIAGNOSIS — Z79899 Other long term (current) drug therapy: Secondary | ICD-10-CM | POA: Diagnosis not present

## 2016-01-16 DIAGNOSIS — Z23 Encounter for immunization: Secondary | ICD-10-CM | POA: Diagnosis not present

## 2016-01-16 DIAGNOSIS — J309 Allergic rhinitis, unspecified: Secondary | ICD-10-CM | POA: Diagnosis not present

## 2016-01-16 DIAGNOSIS — J45909 Unspecified asthma, uncomplicated: Secondary | ICD-10-CM | POA: Diagnosis not present

## 2016-01-28 ENCOUNTER — Encounter: Payer: Self-pay | Admitting: Physician Assistant

## 2016-01-30 DIAGNOSIS — R05 Cough: Secondary | ICD-10-CM | POA: Diagnosis not present

## 2016-02-02 ENCOUNTER — Ambulatory Visit
Admission: RE | Admit: 2016-02-02 | Discharge: 2016-02-02 | Disposition: A | Discharge status: Home / Self Care | Payer: PPO | Source: Ambulatory Visit | Attending: Family Medicine | Admitting: Family Medicine

## 2016-02-02 ENCOUNTER — Other Ambulatory Visit: Payer: Self-pay | Admitting: Family Medicine

## 2016-02-02 ENCOUNTER — Ambulatory Visit: Payer: PPO | Admitting: Physician Assistant

## 2016-02-02 DIAGNOSIS — R05 Cough: Secondary | ICD-10-CM

## 2016-02-02 DIAGNOSIS — R059 Cough, unspecified: Secondary | ICD-10-CM

## 2016-02-03 ENCOUNTER — Other Ambulatory Visit: Payer: Self-pay | Admitting: Family Medicine

## 2016-02-03 DIAGNOSIS — R1011 Right upper quadrant pain: Secondary | ICD-10-CM

## 2016-02-12 ENCOUNTER — Other Ambulatory Visit: Payer: Self-pay | Admitting: Cardiovascular Disease

## 2016-02-13 ENCOUNTER — Ambulatory Visit
Admission: RE | Admit: 2016-02-13 | Discharge: 2016-02-13 | Disposition: A | Discharge status: Home / Self Care | Payer: PPO | Source: Ambulatory Visit | Attending: Family Medicine | Admitting: Family Medicine

## 2016-02-13 DIAGNOSIS — R109 Unspecified abdominal pain: Secondary | ICD-10-CM | POA: Diagnosis not present

## 2016-02-13 DIAGNOSIS — R1011 Right upper quadrant pain: Secondary | ICD-10-CM

## 2016-02-26 ENCOUNTER — Other Ambulatory Visit: Payer: Self-pay | Admitting: Cardiovascular Disease

## 2016-04-16 ENCOUNTER — Ambulatory Visit: Payer: PPO | Admitting: Cardiovascular Disease

## 2016-05-19 DIAGNOSIS — R87810 Cervical high risk human papillomavirus (HPV) DNA test positive: Secondary | ICD-10-CM | POA: Diagnosis not present

## 2016-05-20 ENCOUNTER — Other Ambulatory Visit: Payer: Self-pay | Admitting: Obstetrics and Gynecology

## 2016-05-20 DIAGNOSIS — N72 Inflammatory disease of cervix uteri: Secondary | ICD-10-CM | POA: Diagnosis not present

## 2016-06-03 DIAGNOSIS — L84 Corns and callosities: Secondary | ICD-10-CM | POA: Diagnosis not present

## 2016-06-23 ENCOUNTER — Other Ambulatory Visit: Payer: Self-pay | Admitting: Cardiovascular Disease

## 2016-06-23 DIAGNOSIS — Z79899 Other long term (current) drug therapy: Secondary | ICD-10-CM | POA: Diagnosis not present

## 2016-06-23 DIAGNOSIS — Z6841 Body Mass Index (BMI) 40.0 and over, adult: Secondary | ICD-10-CM | POA: Diagnosis not present

## 2016-06-23 DIAGNOSIS — M2550 Pain in unspecified joint: Secondary | ICD-10-CM | POA: Diagnosis not present

## 2016-06-23 DIAGNOSIS — M0579 Rheumatoid arthritis with rheumatoid factor of multiple sites without organ or systems involvement: Secondary | ICD-10-CM | POA: Diagnosis not present

## 2016-06-24 ENCOUNTER — Other Ambulatory Visit: Payer: Self-pay | Admitting: *Deleted

## 2016-06-24 MED ORDER — POTASSIUM CHLORIDE CRYS ER 20 MEQ PO TBCR: 20.0000 meq | EXTENDED_RELEASE_TABLET | Freq: Every day | ORAL | 0 refills | Status: DC

## 2016-07-26 DIAGNOSIS — E785 Hyperlipidemia, unspecified: Secondary | ICD-10-CM | POA: Diagnosis not present

## 2016-07-26 DIAGNOSIS — I5022 Chronic systolic (congestive) heart failure: Secondary | ICD-10-CM | POA: Diagnosis not present

## 2016-07-26 DIAGNOSIS — Z6841 Body Mass Index (BMI) 40.0 and over, adult: Secondary | ICD-10-CM | POA: Diagnosis not present

## 2016-07-26 DIAGNOSIS — I10 Essential (primary) hypertension: Secondary | ICD-10-CM | POA: Diagnosis not present

## 2016-07-26 DIAGNOSIS — E119 Type 2 diabetes mellitus without complications: Secondary | ICD-10-CM | POA: Diagnosis not present

## 2016-07-26 DIAGNOSIS — M069 Rheumatoid arthritis, unspecified: Secondary | ICD-10-CM | POA: Diagnosis not present

## 2016-07-26 DIAGNOSIS — D509 Iron deficiency anemia, unspecified: Secondary | ICD-10-CM | POA: Diagnosis not present

## 2016-07-26 DIAGNOSIS — Z0000 Encounter for general adult medical examination without abnormal findings: Secondary | ICD-10-CM | POA: Diagnosis not present

## 2016-07-26 DIAGNOSIS — Z7984 Long term (current) use of oral hypoglycemic drugs: Secondary | ICD-10-CM | POA: Diagnosis not present

## 2016-08-06 ENCOUNTER — Ambulatory Visit: Payer: PPO | Admitting: Cardiovascular Disease

## 2016-08-06 DIAGNOSIS — M069 Rheumatoid arthritis, unspecified: Secondary | ICD-10-CM | POA: Diagnosis not present

## 2016-08-18 ENCOUNTER — Other Ambulatory Visit: Payer: Self-pay | Admitting: Cardiovascular Disease

## 2016-08-18 NOTE — Telephone Encounter (Signed)
potassium chloride SA (K-DUR,KLOR-CON) 20 MEQ tablet  Medication  Date: 06/24/2016 Department: [facility] [address] Office Ordering/Authorizing: Kathleene Hazel, MD  Order Providers   Prescribing Provider Encounter Provider  Kathleene Hazel, MD Valrie Hart, CMA  Medication Detail    Disp Refills Start End   potassium chloride SA (K-DUR,KLOR-CON) 20 MEQ tablet 30 tablet 0 06/24/2016    Sig - Route: Take 1 tablet (20 mEq total) by mouth daily. *Patient is overdue for an appointment and needs to call and schedule for further refills* - Oral   E-Prescribing Status: Receipt confirmed by pharmacy (06/24/2016 3:48 PM EDT)   Pharmacy   [id number] - [place], [place] - [address]

## 2016-08-26 DIAGNOSIS — L639 Alopecia areata, unspecified: Secondary | ICD-10-CM | POA: Diagnosis not present

## 2016-09-23 DIAGNOSIS — M2550 Pain in unspecified joint: Secondary | ICD-10-CM | POA: Diagnosis not present

## 2016-09-23 DIAGNOSIS — M0579 Rheumatoid arthritis with rheumatoid factor of multiple sites without organ or systems involvement: Secondary | ICD-10-CM | POA: Diagnosis not present

## 2016-09-23 DIAGNOSIS — Z6841 Body Mass Index (BMI) 40.0 and over, adult: Secondary | ICD-10-CM | POA: Diagnosis not present

## 2016-09-23 DIAGNOSIS — Z79899 Other long term (current) drug therapy: Secondary | ICD-10-CM | POA: Diagnosis not present

## 2016-09-23 DIAGNOSIS — M7989 Other specified soft tissue disorders: Secondary | ICD-10-CM | POA: Diagnosis not present

## 2016-09-30 ENCOUNTER — Encounter: Payer: Self-pay | Admitting: Cardiovascular Disease

## 2016-09-30 ENCOUNTER — Ambulatory Visit (INDEPENDENT_AMBULATORY_CARE_PROVIDER_SITE_OTHER): Payer: PPO | Admitting: Cardiovascular Disease

## 2016-09-30 VITALS — BP 120/68 | HR 80 | Ht 61.0 in | Wt 217.6 lb

## 2016-09-30 DIAGNOSIS — I428 Other cardiomyopathies: Secondary | ICD-10-CM

## 2016-09-30 DIAGNOSIS — I493 Ventricular premature depolarization: Secondary | ICD-10-CM | POA: Diagnosis not present

## 2016-09-30 MED ORDER — LOSARTAN POTASSIUM 50 MG PO TABS: 50.0000 mg | ORAL_TABLET | Freq: Every day | ORAL | 3 refills | Status: DC

## 2016-09-30 MED ORDER — METOPROLOL SUCCINATE ER 50 MG PO TB24: 50.0000 mg | ORAL_TABLET | Freq: Every day | ORAL | 3 refills | Status: DC

## 2016-09-30 NOTE — Progress Notes (Signed)
Chief Complaint  Patient presents with  . Follow-up    cardiomyopathy   History of Present Illness: 46 yo female with history of RA, non-ischemic cardiomyopathy, pericardial effusion who is here today for cardiac follow up. I saw her as new patient 10/23/12 for a second opinion on her cardiac disease. She has been followed in the past by Dr. Armanda Magic. She is known to have a cardiomyopathy for years. at least dating back to 2013. Echo November 2013 with LVEF 30-35%, mild MR. Cardiac MRI in 2014 with no suggestion of myocardial scar or infiltrative disease. Cath 2012 with normal coronary arteries. ABI normal 5/13. Venous dopplers June 2013 with no DVT. I advised against pregnancy given her cardiomyopathy.  She was seen by Norma Fredrickson, NP July 2016 with c/o palpitations and dyspnea. Echo 10/22/14 with LVEF=25-30%. Cardiac monitor with PACs, PVCs.   She is here today for follow up. The patient denies any chest pain, dyspnea, palpitations, lower extremity edema, orthopnea, PND, dizziness, near syncope or syncope.   Primary Care Physician: Shirlean Mylar, MD   Past Medical History:  Diagnosis Date  . Allergy   . Asthma   . Cardiomyopathy    Non-ischemic  . Diabetes mellitus without complication (HCC)   . Herpes simplex of female genitalia   . Hypertension   . Rheumatoid arthritis(714.0)     Past Surgical History:  Procedure Laterality Date  . DENTAL SURGERY    . OVARY SURGERY      Current Outpatient Prescriptions  Medication Sig Dispense Refill  . albuterol (PROAIR HFA) 108 (90 BASE) MCG/ACT inhaler Inhale 2 puffs into the lungs every 6 (six) hours as needed for wheezing or shortness of breath.    . esomeprazole (NEXIUM) 40 MG capsule Take 40 mg by mouth daily before breakfast.    . fluticasone (FLONASE) 50 MCG/ACT nasal spray Place 2 sprays into both nostrils daily.    . hydroxychloroquine (PLAQUENIL) 200 MG tablet Take 200 mg by mouth daily.     Marland Kitchen loratadine (CLARITIN) 10 MG  tablet Take 10 mg by mouth daily.    Marland Kitchen losartan (COZAAR) 50 MG tablet Take 50 mg by mouth daily.    . metFORMIN (GLUCOPHAGE) 500 MG tablet Take 500 mg by mouth 2 (two) times daily with a meal.    . methocarbamol (ROBAXIN) 500 MG tablet Take 500 mg by mouth 2 (two) times daily as needed. Muscle spasms    . metoprolol succinate (TOPROL-XL) 50 MG 24 hr tablet Take 1 tablet (50 mg total) by mouth daily. 30 tablet 6  . omeprazole (PRILOSEC) 40 MG capsule Take 40 mg by mouth daily.    . potassium chloride SA (KLOR-CON M20) 20 MEQ tablet Take 1 tablet (20 mEq total) by mouth daily. PT NEEDS APPT, CALL [phone] TO SCHEDULE, 2ND ATTEMPT 15 tablet 0  . promethazine-codeine (PHENERGAN WITH CODEINE) 6.25-10 MG/5ML syrup Take 5 mLs by mouth every 6 (six) hours as needed for cough.    . SYMBICORT 160-4.5 MCG/ACT inhaler Inhale 2 puffs into the lungs at bedtime.    . traMADol (ULTRAM) 50 MG tablet Take 1 tablet (50 mg total) by mouth 4 (four) times daily. pain 60 tablet 1  . furosemide (LASIX) 40 MG tablet Take 1 tablet (40 mg total) by mouth daily. 30 tablet 6   No current facility-administered medications for this visit.     Allergies  Allergen Reactions  . Clarithromycin     REACTION: upset stomach  . Hydrocodone  REACTION: nausea  . Ondansetron Itching  . Penicillins Swelling  . Tdap [Diphth-Acell Pertussis-Tetanus]     Social History   Social History  . Marital status: Single    Spouse name: N/A  . Number of children: 0  . Years of education: N/A   Occupational History  . Disabled Unemployed   Social History Main Topics  . Smoking status: Never Smoker  . Smokeless tobacco: Never Used  . Alcohol use No  . Drug use: No  . Sexual activity: Not on file   Other Topics Concern  . Not on file   Social History Narrative  . No narrative on file    Family History  Problem Relation Age of Onset  . Heart attack Mother   . CAD Mother   . Allergies Mother   . Breast cancer  Mother   . Hypertension Unknown   . Diabetes Mellitus II Unknown     Review of Systems:  As stated in the HPI and otherwise negative.   BP 120/68   Pulse 80   Ht 5\' 1"  (1.549 m)   Wt 217 lb 9.6 oz (98.7 kg)   SpO2 98%   BMI 41.12 kg/m   Physical Examination: General: Well developed, well nourished, NAD  HEENT: OP clear, mucus membranes moist  SKIN: warm, dry. No rashes. Neuro: No focal deficits  Musculoskeletal: Muscle strength 5/5 all ext  Psychiatric: Mood and affect normal  Neck: No JVD, no carotid bruits, no thyromegaly, no lymphadenopathy.  Lungs:Clear bilaterally, no wheezes, rhonci, crackles Cardiovascular: Regular rate and rhythm. No murmurs, gallops or rubs. Abdomen:Soft. Bowel sounds present. Non-tender.  Extremities: No lower extremity edema. Pulses are 2 + in the bilateral DP/PT.  Echo 10/22/14: Left ventricle: The cavity size was mildly dilated. Systolic function was severely reduced. The estimated ejection fraction was in the range of 25% to 30%. Diffuse hypokinesis. - Mitral valve: Calcified annulus. - Left atrium: The atrium was mildly dilated.  Cardiac MRI 03/27/13: 1. Mildly dilated LV with moderate global hypokinesis, EF 40%. 2. Normal RV size and systolic function. 3. No myocardial delayed enhancement, so no definitive evidence for prior MI, infiltrative disease, or myocarditis.  EKG:  EKG is ordered today. The ekg ordered today demonstrates NSR, rate 73 bpm. T wave inversions diffusely, unchanged.   Recent Labs: No results found for requested labs within last 8760 hours.   Lipid Panel    Component Value Date/Time   CHOL 139 03/03/2012 1215   TRIG 115 03/03/2012 1215   HDL 40 03/03/2012 1215   CHOLHDL 3.5 03/03/2012 1215   VLDL 23 03/03/2012 1215   LDLCALC 76 03/03/2012 1215     Wt Readings from Last 3 Encounters:  09/30/16 217 lb 9.6 oz (98.7 kg)  05/26/15 253 lb 1.9 oz (114.8 kg)  12/31/14 250 lb 8 oz (113.6 kg)     Other  studies Reviewed: Additional studies/ records that were reviewed today include: . Review of the above records demonstrates:   Assessment and Plan:   1. Non-ischemic cardiomyopathy: She is known to have LV systolic dysfunction dating back for years. Cardiac MRI in 2014 with LVEF=40% with no scar or infiltrative disease. Cath in 2012 with no CAD. Last echo in 2016 with LVEF=30%. She feels great. Continue beta blocker and ARB.    2. PVC/PAC: continue beta blocker  Current medicines are reviewed at length with the patient today.  The patient does not have concerns regarding medicines.  The following changes have been made:  no change  Labs/ tests ordered today include:  No orders of the defined types were placed in this encounter.   Disposition:   FU with me in 12  months  Signed, Verne Carrow, MD 09/30/2016 4:03 PM    [facility] [address] [place], [place], [place]  [address] Phone: [phone]; Fax: [phone]

## 2016-09-30 NOTE — Patient Instructions (Signed)

## 2016-10-18 ENCOUNTER — Other Ambulatory Visit: Payer: Self-pay | Admitting: Cardiovascular Disease

## 2016-10-18 DIAGNOSIS — I428 Other cardiomyopathies: Secondary | ICD-10-CM

## 2016-11-16 DIAGNOSIS — R0602 Shortness of breath: Secondary | ICD-10-CM | POA: Diagnosis not present

## 2016-11-16 DIAGNOSIS — D649 Anemia, unspecified: Secondary | ICD-10-CM | POA: Diagnosis not present

## 2016-11-16 DIAGNOSIS — I5022 Chronic systolic (congestive) heart failure: Secondary | ICD-10-CM | POA: Diagnosis not present

## 2016-11-17 ENCOUNTER — Other Ambulatory Visit: Payer: Self-pay | Admitting: Family Medicine

## 2016-11-17 ENCOUNTER — Ambulatory Visit
Admission: RE | Admit: 2016-11-17 | Discharge: 2016-11-17 | Disposition: A | Discharge status: Home / Self Care | Payer: PPO | Source: Ambulatory Visit | Attending: Family Medicine | Admitting: Family Medicine

## 2016-11-17 DIAGNOSIS — I5023 Acute on chronic systolic (congestive) heart failure: Secondary | ICD-10-CM | POA: Diagnosis not present

## 2016-11-17 DIAGNOSIS — R0602 Shortness of breath: Secondary | ICD-10-CM | POA: Diagnosis not present

## 2016-11-22 DIAGNOSIS — I5023 Acute on chronic systolic (congestive) heart failure: Secondary | ICD-10-CM | POA: Diagnosis not present

## 2016-11-24 DIAGNOSIS — I5022 Chronic systolic (congestive) heart failure: Secondary | ICD-10-CM | POA: Diagnosis not present

## 2016-11-25 ENCOUNTER — Telehealth: Payer: Self-pay

## 2016-11-25 NOTE — Telephone Encounter (Signed)
SENT NOTES TO SCHEDULING 

## 2016-11-26 ENCOUNTER — Other Ambulatory Visit: Payer: Self-pay | Admitting: Family Medicine

## 2016-11-26 DIAGNOSIS — I5023 Acute on chronic systolic (congestive) heart failure: Secondary | ICD-10-CM

## 2016-11-30 ENCOUNTER — Other Ambulatory Visit: Payer: Self-pay

## 2016-11-30 ENCOUNTER — Ambulatory Visit (HOSPITAL_COMMUNITY): Payer: PPO | Attending: Cardiovascular Disease

## 2016-11-30 DIAGNOSIS — I069 Rheumatic aortic valve disease, unspecified: Secondary | ICD-10-CM | POA: Diagnosis not present

## 2016-11-30 DIAGNOSIS — I5023 Acute on chronic systolic (congestive) heart failure: Secondary | ICD-10-CM

## 2016-11-30 DIAGNOSIS — I5030 Unspecified diastolic (congestive) heart failure: Secondary | ICD-10-CM | POA: Insufficient documentation

## 2016-12-03 ENCOUNTER — Telehealth: Payer: Self-pay | Admitting: Cardiovascular Disease

## 2016-12-03 NOTE — Telephone Encounter (Signed)
New Message  Pt call requesting to speak with Rn about getting echo results.please call back to discuss

## 2016-12-03 NOTE — Telephone Encounter (Signed)
Echo was ordered by Dr. Hyman Hopes.  I spoke with pt and asked her to contact Dr. Marland Mcalpine office for results.

## 2016-12-08 ENCOUNTER — Encounter: Payer: Self-pay | Admitting: *Deleted

## 2016-12-08 ENCOUNTER — Telehealth: Payer: Self-pay | Admitting: *Deleted

## 2016-12-08 NOTE — Telephone Encounter (Signed)
I spoke with pt and scheduled her to see Dr. Clifton James on 12/10/16 at 9:00.  She reports she continues to have shortness of breath and coughing.  Taking fluid pill but doesn't think it has been helping.

## 2016-12-08 NOTE — Telephone Encounter (Signed)
Received fax from Dr. Hyman Hopes requesting appointment for pt for medication titration due to worsened CHF.  I placed call to pt to schedule this appointment.  Left message to call back

## 2016-12-09 ENCOUNTER — Ambulatory Visit: Payer: PPO | Admitting: Cardiology

## 2016-12-10 ENCOUNTER — Encounter: Payer: Self-pay | Admitting: Cardiovascular Disease

## 2016-12-10 ENCOUNTER — Ambulatory Visit (INDEPENDENT_AMBULATORY_CARE_PROVIDER_SITE_OTHER): Payer: PPO | Admitting: Cardiovascular Disease

## 2016-12-10 VITALS — BP 114/70 | HR 83 | Ht 61.0 in | Wt 226.8 lb

## 2016-12-10 DIAGNOSIS — I428 Other cardiomyopathies: Secondary | ICD-10-CM

## 2016-12-10 DIAGNOSIS — I5021 Acute systolic (congestive) heart failure: Secondary | ICD-10-CM | POA: Diagnosis not present

## 2016-12-10 LAB — BASIC METABOLIC PANEL
BUN/Creatinine Ratio: 14 (ref 9–23)
BUN: 8 mg/dL (ref 6–24)
CALCIUM: 8.5 mg/dL — AB (ref 8.7–10.2)
CO2: 23 mmol/L (ref 20–29)
Chloride: 103 mmol/L (ref 96–106)
Creatinine, Ser: 0.59 mg/dL (ref 0.57–1.00)
GFR, EST AFRICAN AMERICAN: 129 mL/min/{1.73_m2} (ref >59)
GFR, EST NON AFRICAN AMERICAN: 112 mL/min/{1.73_m2} (ref >59)
Glucose: 114 mg/dL — ABNORMAL HIGH (ref 65–99)
POTASSIUM: 3.7 mmol/L (ref 3.5–5.2)
SODIUM: 140 mmol/L (ref 134–144)

## 2016-12-10 MED ORDER — SACUBITRIL-VALSARTAN 49-51 MG PO TABS: 1.0000 | ORAL_TABLET | Freq: Two times a day (BID) | ORAL | 3 refills | Status: DC

## 2016-12-10 NOTE — Progress Notes (Signed)
Chief Complaint  Patient presents with  . Shortness of Breath  . Cough   History of Present Illness: 46 yo female with history of RA, non-ischemic cardiomyopathy, pericardial effusion who is here today for cardiac follow up. I saw her as new patient 10/23/12 for a second opinion on her cardiac disease. She has been followed in the past by Dr. Armanda Magic. She is known to have a cardiomyopathy for years. at least dating back to 2013. Echo November 2013 with LVEF 30-35%, mild MR. Cardiac MRI in 2014 with no suggestion of myocardial scar or infiltrative disease. Cardiac cath in 2012 with normal coronary arteries. ABI normal 5/13. Venous dopplers June 2013 with no DVT. I advised against pregnancy given her cardiomyopathy.  She was seen by Norma Fredrickson, NP July 2016 with c/o palpitations and dyspnea. Echo 10/22/14 with LVEF=25-30%. Cardiac monitor with PACs, PVCs. She has done well over the last five years with no symptoms due to CHF. I saw her in June of 2018 and she was doing well on beta blocker and ARB therapy with class 1 symptoms. 2 weeks ago she started having dyspnea and was seen in primary care. She was started on Lasix and has had this titrated over the past two weeks with loss of 10 lbs. Echo 11/30/16 with LVEF=25-30% with normal wall thickness and no significant valve disease. While seeing some improvement in her dyspnea and LE edema over the past two weeks on Lasix, she continue to have dyspnea with exertion and cough. Her LE edema is improved. She is now NYHA class 2-3. She does endorse chest pain with inspiration.     Primary Care Physician: Shirlean Mylar, MD  Past Medical History:  Diagnosis Date  . Allergy   . Asthma   . Cardiomyopathy    Non-ischemic  . Diabetes mellitus without complication (HCC)   . Herpes simplex of female genitalia   . Hypertension   . Rheumatoid arthritis(714.0)     Past Surgical History:  Procedure Laterality Date  . DENTAL SURGERY    . OVARY SURGERY        Current Outpatient Prescriptions  Medication Sig Dispense Refill  . albuterol (PROAIR HFA) 108 (90 BASE) MCG/ACT inhaler Inhale 2 puffs into the lungs every 6 (six) hours as needed for wheezing or shortness of breath.    . esomeprazole (NEXIUM) 40 MG capsule Take 40 mg by mouth daily before breakfast.    . fluticasone (FLONASE) 50 MCG/ACT nasal spray Place 2 sprays into both nostrils daily.    . furosemide (LASIX) 80 MG tablet Take 80 mg by mouth 2 (two) times daily.    . hydroxychloroquine (PLAQUENIL) 200 MG tablet Take 200 mg by mouth daily.     Marland Kitchen KLOR-CON M20 20 MEQ tablet TAKE 1 TABLET BY MOUTH ONCE DAILY 90 tablet 3  . loratadine (CLARITIN) 10 MG tablet Take 10 mg by mouth daily.    . metFORMIN (GLUCOPHAGE) 500 MG tablet Take 500 mg by mouth 2 (two) times daily with a meal.    . methocarbamol (ROBAXIN) 500 MG tablet Take 500 mg by mouth 2 (two) times daily as needed. Muscle spasms    . metoprolol succinate (TOPROL-XL) 50 MG 24 hr tablet Take 1 tablet (50 mg total) by mouth daily. 90 tablet 3  . omeprazole (PRILOSEC) 40 MG capsule Take 40 mg by mouth daily.    . promethazine-codeine (PHENERGAN WITH CODEINE) 6.25-10 MG/5ML syrup Take 5 mLs by mouth every 6 (six) hours as  needed for cough.    . SYMBICORT 160-4.5 MCG/ACT inhaler Inhale 2 puffs into the lungs at bedtime.    . traMADol (ULTRAM) 50 MG tablet Take 1 tablet (50 mg total) by mouth 4 (four) times daily. pain 60 tablet 1  . sacubitril-valsartan (ENTRESTO) 49-51 MG Take 1 tablet by mouth 2 (two) times daily. 60 tablet 3   No current facility-administered medications for this visit.     Allergies  Allergen Reactions  . Clarithromycin     REACTION: upset stomach  . Hydrocodone     REACTION: nausea  . Ondansetron Itching  . Penicillins Swelling  . Tdap [Diphth-Acell Pertussis-Tetanus]     Social History   Social History  . Marital status: Single    Spouse name: N/A  . Number of children: 0  . Years of education: N/A    Occupational History  . Disabled Unemployed   Social History Main Topics  . Smoking status: Never Smoker  . Smokeless tobacco: Never Used  . Alcohol use No  . Drug use: No  . Sexual activity: Not on file   Other Topics Concern  . Not on file   Social History Narrative  . No narrative on file    Family History  Problem Relation Age of Onset  . Heart attack Mother   . CAD Mother   . Allergies Mother   . Breast cancer Mother   . Hypertension Unknown   . Diabetes Mellitus II Unknown     Review of Systems:  As stated in the HPI and otherwise negative.   BP 114/70   Pulse 83   Ht 5\' 1"  (1.549 m)   Wt 226 lb 12.8 oz (102.9 kg)   SpO2 97%   BMI 42.85 kg/m   Physical Examination:  General: Well developed, well nourished, NAD  HEENT: OP clear, mucus membranes moist  SKIN: warm, dry. No rashes. Neuro: No focal deficits  Musculoskeletal: Muscle strength 5/5 all ext  Psychiatric: Mood and affect normal  Neck: + JVD, no carotid bruits, no thyromegaly, no lymphadenopathy.  Lungs:Clear bilaterally, no wheezes, rhonci, crackles Cardiovascular: Regular rate and rhythm. No murmurs, gallops or rubs. Abdomen:Soft. Bowel sounds present. Non-tender.  Extremities: No lower extremity edema. Pulses are 2 + in the bilateral DP/PT.  Echo 11/30/16: - Left ventricle: The cavity size was normal. Wall thickness was   normal. Systolic function was severely reduced. The estimated   ejection fraction was in the range of 25% to 30%. Diffuse   hypokinesis. Doppler parameters are consistent with a reversible   restrictive pattern, indicative of decreased left ventricular   diastolic compliance and/or increased left atrial pressure (grade   3 diastolic dysfunction). - Aortic valve: Mildly calcified annulus. Mildly thickened, mildly   calcified leaflets.  Cardiac MRI 03/27/13: 1. Mildly dilated LV with moderate global hypokinesis, EF 40%. 2. Normal RV size and systolic function. 3. No  myocardial delayed enhancement, so no definitive evidence for prior MI, infiltrative disease, or myocarditis.  EKG:  EKG is not ordered today. The ekg ordered today demonstrates    Recent Labs: No results found for requested labs within last 8760 hours.   Lipid Panel Followed in primary care   Wt Readings from Last 3 Encounters:  12/10/16 226 lb 12.8 oz (102.9 kg)  09/30/16 217 lb 9.6 oz (98.7 kg)  05/26/15 253 lb 1.9 oz (114.8 kg)     Other studies Reviewed: Additional studies/ records that were reviewed today include: . Review of the above  records demonstrates:   Assessment and Plan:   1. Non-ischemic cardiomyopathy/Acute on Chronic systolic CHF: She has a long standing cardiomyopathy with prior cardiac cath showing normal coronary arteries and cardiac MRI in 2014 with no scar or infiltrative disease. Last echo 8//21/18 with LVEF 25-30%, unchanged. Her LV wall thickness is normal and there is no valve disease. She is now NYHA class 2-3 and a change in her medical therapy is indicated. She continues to have findings of volume overload on exam.  -I will plan on continuing her Toprol.  -I will stop Cozaar and start Entresto which may also aid in diuresis.  -Will continue Lasix 80 mg po BID.  -Ultimately, I would like to start spironolactone if she tolerates the current change in therapy.  -Since she is now symptomatic, she will need to be considered for an ICD for primary prevention of sudden cardiac death. I will check a  BMET today and plan medication titration over the next two weeks. I will see her back in the office in two weeks and will then have further discussion regarding ICD therapy.  -I will go ahead now and get her on the schedule in the advanced CHF clinic given her change in symptoms and continued systolic dysfunction. I think an opinion from our CHF specialists would help outline any change in therapy or plan that is needed.   2. PVC/PAC: No palpitations. Continue beta  blocker.   Current medicines are reviewed at length with the patient today.  The patient does not have concerns regarding medicines.  The following changes have been made:  no change  Labs/ tests ordered today include:   Orders Placed This Encounter  Procedures  . Basic Metabolic Panel (BMET)  . Basic Metabolic Panel (BMET)    Disposition:   FU with me in 2 weeks  Signed, Verne Carrow, MD 12/10/2016 10:18 AM    [facility] [address] [place], [place], [place]  [address] Phone: [phone]; Fax: [phone]

## 2016-12-10 NOTE — Patient Instructions (Signed)
Medication Instructions:  Your physician has recommended you make the following change in your medication:  Stop Losartan. Start Entresto 49/51 mg by mouth twice daily.   Labwork: Lab work to be done today--BMP.  Your physician recommends that you return for lab work in: 10 days.  Scheduled for 12/20/16.  (BMP).  The lab opens at 7:30 AM   Testing/Procedures: none  Follow-Up: Your physician recommends that you schedule a follow-up appointment in: 2 weeks. Scheduled for 12/27/16 at 3:20    Any Other Special Instructions Will Be Listed Below (If Applicable).     If you need a refill on your cardiac medications before your next appointment, please call your pharmacy.

## 2016-12-15 ENCOUNTER — Telehealth (HOSPITAL_COMMUNITY): Payer: Self-pay | Admitting: Cardiology

## 2016-12-15 DIAGNOSIS — M059 Rheumatoid arthritis with rheumatoid factor, unspecified: Secondary | ICD-10-CM | POA: Diagnosis not present

## 2016-12-15 DIAGNOSIS — Z049 Encounter for examination and observation for unspecified reason: Secondary | ICD-10-CM | POA: Diagnosis not present

## 2016-12-15 DIAGNOSIS — Z79899 Other long term (current) drug therapy: Secondary | ICD-10-CM | POA: Diagnosis not present

## 2016-12-15 DIAGNOSIS — H04123 Dry eye syndrome of bilateral lacrimal glands: Secondary | ICD-10-CM | POA: Diagnosis not present

## 2016-12-15 DIAGNOSIS — E119 Type 2 diabetes mellitus without complications: Secondary | ICD-10-CM | POA: Diagnosis not present

## 2016-12-15 DIAGNOSIS — Z09 Encounter for follow-up examination after completed treatment for conditions other than malignant neoplasm: Secondary | ICD-10-CM | POA: Diagnosis not present

## 2016-12-15 NOTE — Telephone Encounter (Signed)
Left message for patient to call back, need to give New pt appt info to pt & mail packet.

## 2016-12-16 ENCOUNTER — Telehealth: Payer: Self-pay | Admitting: *Deleted

## 2016-12-16 NOTE — Telephone Encounter (Signed)
Pt has been notified of lab results by phone with verbal understanding. Pt aware to continue on her current Tx plan. Pt thanked me for my call today.   

## 2016-12-16 NOTE — Telephone Encounter (Signed)
Patient returned my call and is aware of appt 01/13/17 with Dr. Shirlee Latch at 11:40 am.  New patient packet mailed to pt.

## 2016-12-16 NOTE — Telephone Encounter (Signed)
-----   Message from Kathleene Hazel, MD sent at 12/13/2016  8:28 AM EDT ----- BMET is ok. This was checked since we were starting Entresto. cdm

## 2016-12-17 ENCOUNTER — Telehealth (HOSPITAL_COMMUNITY): Payer: Self-pay | Admitting: Pharmacist

## 2016-12-17 NOTE — Telephone Encounter (Signed)
Entresto 49-51 mg BID PA approved by [employer] through 04/11/17.   Tyler Deis. Bonnye Fava, PharmD, BCPS, CPP Clinical Pharmacist Pager: [phone] Phone: [phone] 12/17/2016 10:34 AM

## 2016-12-20 ENCOUNTER — Other Ambulatory Visit: Payer: PPO | Admitting: *Deleted

## 2016-12-20 DIAGNOSIS — I428 Other cardiomyopathies: Secondary | ICD-10-CM | POA: Diagnosis not present

## 2016-12-20 LAB — BASIC METABOLIC PANEL
BUN / CREAT RATIO: 18 (ref 9–23)
BUN: 11 mg/dL (ref 6–24)
CO2: 24 mmol/L (ref 20–29)
CREATININE: 0.61 mg/dL (ref 0.57–1.00)
Calcium: 8.9 mg/dL (ref 8.7–10.2)
Chloride: 101 mmol/L (ref 96–106)
GFR calc Af Amer: 128 mL/min/{1.73_m2} (ref >59)
GFR, EST NON AFRICAN AMERICAN: 111 mL/min/{1.73_m2} (ref >59)
GLUCOSE: 98 mg/dL (ref 65–99)
Potassium: 3.9 mmol/L (ref 3.5–5.2)
Sodium: 140 mmol/L (ref 134–144)

## 2016-12-27 ENCOUNTER — Ambulatory Visit (INDEPENDENT_AMBULATORY_CARE_PROVIDER_SITE_OTHER): Payer: PPO | Admitting: Cardiovascular Disease

## 2016-12-27 ENCOUNTER — Encounter: Payer: Self-pay | Admitting: Cardiovascular Disease

## 2016-12-27 VITALS — BP 118/80 | HR 83 | Ht 61.0 in | Wt 227.4 lb

## 2016-12-27 DIAGNOSIS — I5023 Acute on chronic systolic (congestive) heart failure: Secondary | ICD-10-CM | POA: Diagnosis not present

## 2016-12-27 DIAGNOSIS — I428 Other cardiomyopathies: Secondary | ICD-10-CM | POA: Diagnosis not present

## 2016-12-27 MED ORDER — SPIRONOLACTONE 25 MG PO TABS: 25.0000 mg | ORAL_TABLET | Freq: Every day | ORAL | 3 refills | Status: DC

## 2016-12-27 NOTE — Progress Notes (Signed)
Chief Complaint  Patient presents with  . Follow-up    Dyspnea   History of Present Illness: 46 yo female with history of RA, non-ischemic cardiomyopathy, pericardial effusion who is here today for cardiac follow up. I saw her as new patient 10/23/12 for a second opinion on her cardiac disease. She has been followed in the past by Dr. Armanda Magic. She is known to have a cardiomyopathy for years. at least dating back to 2013. Echo November 2013 with LVEF 30-35%, mild MR. Cardiac MRI in 2014 with no suggestion of myocardial scar or infiltrative disease. Cardiac cath in 2012 with normal coronary arteries. ABI normal 5/13. Venous dopplers June 2013 with no DVT. I advised against pregnancy given her cardiomyopathy.  She was seen by Norma Fredrickson, NP July 2016 with c/o palpitations and dyspnea. Echo 10/22/14 with LVEF=25-30%. Cardiac monitor with PACs, PVCs. She has done well over the last five years with no symptoms due to CHF. I saw her in June of 2018 and she was doing well on beta blocker and ARB therapy with class 1 symptoms. 2 weeks ago she started having dyspnea and was seen in primary care. She was started on Lasix and has had this titrated over the past two weeks with loss of 10 lbs. Echo 11/30/16 with LVEF=25-30% with normal wall thickness and no significant valve disease. I saw her 12/10/16 and she still had c/o LE edema and dyspnea. I stopped Cozaar and started Entresto.    She is here today for follow up. She continues to have cough and dyspnea. She has improvement in LE edema. She has chest pain with inspiration. She is overall feeling better. She has only been taking Lasix 40 mg po BID instead of the recommended 80 mg po BID. She states she did not think the higher dose was helping. She has only been taking Entresto once per day. She has not been weighing at home.   Primary Care Physician: Shirlean Mylar, MD  Past Medical History:  Diagnosis Date  . Allergy   . Asthma   . Cardiomyopathy    Non-ischemic  . Diabetes mellitus without complication (HCC)   . Herpes simplex of female genitalia   . Hypertension   . Rheumatoid arthritis(714.0)     Past Surgical History:  Procedure Laterality Date  . DENTAL SURGERY    . OVARY SURGERY      Current Outpatient Prescriptions  Medication Sig Dispense Refill  . albuterol (PROAIR HFA) 108 (90 BASE) MCG/ACT inhaler Inhale 2 puffs into the lungs every 6 (six) hours as needed for wheezing or shortness of breath.    . esomeprazole (NEXIUM) 40 MG capsule Take 40 mg by mouth daily before breakfast.    . fluticasone (FLONASE) 50 MCG/ACT nasal spray Place 2 sprays into both nostrils daily.    . furosemide (LASIX) 80 MG tablet Take 80 mg by mouth 2 (two) times daily.    . hydroxychloroquine (PLAQUENIL) 200 MG tablet Take 200 mg by mouth daily.     Marland Kitchen KLOR-CON M20 20 MEQ tablet TAKE 1 TABLET BY MOUTH ONCE DAILY 90 tablet 3  . loratadine (CLARITIN) 10 MG tablet Take 10 mg by mouth daily.    . metFORMIN (GLUCOPHAGE) 500 MG tablet Take 500 mg by mouth 2 (two) times daily with a meal.    . methocarbamol (ROBAXIN) 500 MG tablet Take 500 mg by mouth 2 (two) times daily as needed. Muscle spasms    . metoprolol succinate (TOPROL-XL) 50 MG  24 hr tablet Take 1 tablet (50 mg total) by mouth daily. 90 tablet 3  . omeprazole (PRILOSEC) 40 MG capsule Take 40 mg by mouth daily.    . promethazine-codeine (PHENERGAN WITH CODEINE) 6.25-10 MG/5ML syrup Take 5 mLs by mouth every 6 (six) hours as needed for cough.    . sacubitril-valsartan (ENTRESTO) 49-51 MG Take 1 tablet by mouth 2 (two) times daily. 60 tablet 3  . SYMBICORT 160-4.5 MCG/ACT inhaler Inhale 2 puffs into the lungs at bedtime.    . traMADol (ULTRAM) 50 MG tablet Take 1 tablet (50 mg total) by mouth 4 (four) times daily. pain 60 tablet 1  . spironolactone (ALDACTONE) 25 MG tablet Take 1 tablet (25 mg total) by mouth daily. 30 tablet 3   No current facility-administered medications for this visit.      Allergies  Allergen Reactions  . Clarithromycin     REACTION: upset stomach  . Hydrocodone     REACTION: nausea  . Ondansetron Itching  . Penicillins Swelling  . Tdap [Diphth-Acell Pertussis-Tetanus]     Social History   Social History  . Marital status: Single    Spouse name: N/A  . Number of children: 0  . Years of education: N/A   Occupational History  . Disabled Unemployed   Social History Main Topics  . Smoking status: Never Smoker  . Smokeless tobacco: Never Used  . Alcohol use No  . Drug use: No  . Sexual activity: Not on file   Other Topics Concern  . Not on file   Social History Narrative  . No narrative on file    Family History  Problem Relation Age of Onset  . Heart attack Mother   . CAD Mother   . Allergies Mother   . Breast cancer Mother   . Hypertension Unknown   . Diabetes Mellitus II Unknown     Review of Systems:  As stated in the HPI and otherwise negative.   BP 118/80   Pulse 83   Ht 5\' 1"  (1.549 m)   Wt 227 lb 6.4 oz (103.1 kg)   SpO2 99%   BMI 42.97 kg/m   Physical Examination:  General: Well developed, well nourished, NAD  HEENT: OP clear, mucus membranes moist  SKIN: warm, dry. No rashes. Neuro: No focal deficits  Musculoskeletal: Muscle strength 5/5 all ext  Psychiatric: Mood and affect normal  Neck: + JVD, no carotid bruits, no thyromegaly, no lymphadenopathy.  Lungs:Clear bilaterally, no wheezes, rhonci, crackles Cardiovascular: Regular rate and rhythm. No murmurs, gallops or rubs. Abdomen:Soft. Bowel sounds present. Non-tender.  Extremities: Trace bilateral lower extremity edema. Pulses are 2 + in the bilateral DP/PT.  Echo 11/30/16: - Left ventricle: The cavity size was normal. Wall thickness was   normal. Systolic function was severely reduced. The estimated   ejection fraction was in the range of 25% to 30%. Diffuse   hypokinesis. Doppler parameters are consistent with a reversible   restrictive pattern,  indicative of decreased left ventricular   diastolic compliance and/or increased left atrial pressure (grade   3 diastolic dysfunction). - Aortic valve: Mildly calcified annulus. Mildly thickened, mildly   calcified leaflets.  Cardiac MRI 03/27/13: 1. Mildly dilated LV with moderate global hypokinesis, EF 40%. 2. Normal RV size and systolic function. 3. No myocardial delayed enhancement, so no definitive evidence for prior MI, infiltrative disease, or myocarditis.  EKG:  EKG is not ordered today. The ekg ordered today demonstrates    Recent Labs: 12/20/2016:  BUN 11; Creatinine, Ser 0.61; Potassium 3.9; Sodium 140   Lipid Panel Followed in primary care   Wt Readings from Last 3 Encounters:  12/27/16 227 lb 6.4 oz (103.1 kg)  12/10/16 226 lb 12.8 oz (102.9 kg)  09/30/16 217 lb 9.6 oz (98.7 kg)     Other studies Reviewed: Additional studies/ records that were reviewed today include: . Review of the above records demonstrates:   Assessment and Plan:   1. Non-ischemic cardiomyopathy/Acute on Chronic systolic CHF: She has a long standing cardiomyopathy with prior cardiac cath showing normal coronary arteries and cardiac MRI in 2014 with no scar or infiltrative disease. Last echo 8//21/18 with LVEF 25-30%, unchanged. Her LV wall thickness is normal and there is no valve disease. She has developed symptoms of Class 2-3 CHF over the past few months.    Update since last visit: She is still NYHA class 2-3. She has minimal LE edema today but she continues to have dyspnea and her weight is still up. She has not been taking Lasix as advised. She cut back to Lasix 40 mg po BID. She has also been taking her Entresto once daily instead of BID. She has not been weighing at home. I will increase her Lasix back to 80 mg po BID and have her take her Entresto as advised BID. I will start spironolactone 25 mg po Qdaily. Repeat BMET one week. She is on GDMT and given the fact that she is NYHA class 2-3,  she would likely benefit from placement of an ICD for primary prevention of sudden cardiac death. She has an appointment on 01/13/17 in the advanced CHF clinic. I would like to get an opinion on her medical therapy from our CHF team. Question if a right heart cath may be helpful. I will hold off on EP referral for ICD until she is seen in the CHF clinic.   2. PVC/PAC: No awareness of palpitations. Continue beta blocker.   Current medicines are reviewed at length with the patient today.  The patient does not have concerns regarding medicines.  The following changes have been made:  no change  Labs/ tests ordered today include:   Orders Placed This Encounter  Procedures  . Basic Metabolic Panel (BMET)    Disposition:   FU with me in 6 months since she will be seen in the CHF clinic.   Signed, Verne Carrow, MD 12/27/2016 6:50 PM    [facility] [address] [place], [place], [place]  [address] Phone: [phone]; Fax: [phone]

## 2016-12-27 NOTE — Patient Instructions (Addendum)
Medication Instructions:  Your physician has recommended you make the following change in your medication:  Increase furosemide back to 80 mg by mouth twice daily. Take Entresto twice daily. Start spironolactone 25 mg by mouth daily.     Labwork: Your physician recommends that you return for lab work in: one week. --01/03/17.  The lab opens at 7:30 AM   Testing/Procedures: none  Follow-Up: Your physician recommends that you schedule a follow-up appointment in: 6 months. Please call our office in about 3 months to schedule this appointment.   You have an appointment in the CHF clinic on 01/13/2017    Any Other Special Instructions Will Be Listed Below (If Applicable).     If you need a refill on your cardiac medications before your next appointment, please call your pharmacy.

## 2016-12-29 ENCOUNTER — Telehealth: Payer: Self-pay | Admitting: Cardiovascular Disease

## 2016-12-29 NOTE — Telephone Encounter (Signed)
Pt states pharmacy needs okay to fill spironolactone since she is taking potassium. [facility] pharmacy aware to fill spironolactone, pt aware to return for BMET 01/06/17, I week after she starts spironolactone.

## 2016-12-29 NOTE — Telephone Encounter (Signed)
LMTCB

## 2016-12-29 NOTE — Telephone Encounter (Signed)
New message    Pt is calling about a new medication that was supposed to be called in.

## 2016-12-30 DIAGNOSIS — L280 Lichen simplex chronicus: Secondary | ICD-10-CM | POA: Diagnosis not present

## 2017-01-03 ENCOUNTER — Other Ambulatory Visit: Payer: PPO

## 2017-01-06 ENCOUNTER — Other Ambulatory Visit: Payer: PPO | Admitting: *Deleted

## 2017-01-06 DIAGNOSIS — I5023 Acute on chronic systolic (congestive) heart failure: Secondary | ICD-10-CM | POA: Diagnosis not present

## 2017-01-07 LAB — BASIC METABOLIC PANEL
BUN/Creatinine Ratio: 20 (ref 9–23)
BUN: 16 mg/dL (ref 6–24)
CALCIUM: 9.7 mg/dL (ref 8.7–10.2)
CO2: 24 mmol/L (ref 20–29)
CREATININE: 0.81 mg/dL (ref 0.57–1.00)
Chloride: 102 mmol/L (ref 96–106)
GFR calc Af Amer: 102 mL/min/{1.73_m2} (ref >59)
GFR, EST NON AFRICAN AMERICAN: 89 mL/min/{1.73_m2} (ref >59)
GLUCOSE: 76 mg/dL (ref 65–99)
POTASSIUM: 4.4 mmol/L (ref 3.5–5.2)
Sodium: 142 mmol/L (ref 134–144)

## 2017-01-12 DIAGNOSIS — M2550 Pain in unspecified joint: Secondary | ICD-10-CM | POA: Diagnosis not present

## 2017-01-12 DIAGNOSIS — M7989 Other specified soft tissue disorders: Secondary | ICD-10-CM | POA: Diagnosis not present

## 2017-01-12 DIAGNOSIS — M0579 Rheumatoid arthritis with rheumatoid factor of multiple sites without organ or systems involvement: Secondary | ICD-10-CM | POA: Diagnosis not present

## 2017-01-12 DIAGNOSIS — Z79899 Other long term (current) drug therapy: Secondary | ICD-10-CM | POA: Diagnosis not present

## 2017-01-12 DIAGNOSIS — Z6841 Body Mass Index (BMI) 40.0 and over, adult: Secondary | ICD-10-CM | POA: Diagnosis not present

## 2017-01-13 ENCOUNTER — Other Ambulatory Visit (HOSPITAL_COMMUNITY): Payer: Self-pay

## 2017-01-13 ENCOUNTER — Encounter (HOSPITAL_COMMUNITY): Payer: Self-pay | Admitting: Cardiology

## 2017-01-13 ENCOUNTER — Ambulatory Visit (HOSPITAL_COMMUNITY)
Admission: RE | Admit: 2017-01-13 | Discharge: 2017-01-13 | Disposition: A | Discharge status: Home / Self Care | Payer: PPO | Source: Ambulatory Visit | Attending: Cardiology | Admitting: Cardiology

## 2017-01-13 ENCOUNTER — Encounter (HOSPITAL_COMMUNITY): Payer: Self-pay

## 2017-01-13 VITALS — BP 124/73 | HR 84 | Wt 223.2 lb

## 2017-01-13 DIAGNOSIS — Z7984 Long term (current) use of oral hypoglycemic drugs: Secondary | ICD-10-CM | POA: Diagnosis not present

## 2017-01-13 DIAGNOSIS — I428 Other cardiomyopathies: Secondary | ICD-10-CM | POA: Diagnosis not present

## 2017-01-13 DIAGNOSIS — Z7951 Long term (current) use of inhaled steroids: Secondary | ICD-10-CM | POA: Diagnosis not present

## 2017-01-13 DIAGNOSIS — R05 Cough: Secondary | ICD-10-CM | POA: Diagnosis not present

## 2017-01-13 DIAGNOSIS — Z8249 Family history of ischemic heart disease and other diseases of the circulatory system: Secondary | ICD-10-CM | POA: Insufficient documentation

## 2017-01-13 DIAGNOSIS — Z803 Family history of malignant neoplasm of breast: Secondary | ICD-10-CM | POA: Insufficient documentation

## 2017-01-13 DIAGNOSIS — Z79899 Other long term (current) drug therapy: Secondary | ICD-10-CM | POA: Diagnosis not present

## 2017-01-13 DIAGNOSIS — E119 Type 2 diabetes mellitus without complications: Secondary | ICD-10-CM | POA: Diagnosis not present

## 2017-01-13 DIAGNOSIS — M069 Rheumatoid arthritis, unspecified: Secondary | ICD-10-CM | POA: Diagnosis not present

## 2017-01-13 DIAGNOSIS — I5022 Chronic systolic (congestive) heart failure: Secondary | ICD-10-CM | POA: Diagnosis not present

## 2017-01-13 DIAGNOSIS — R0600 Dyspnea, unspecified: Secondary | ICD-10-CM

## 2017-01-13 DIAGNOSIS — I110 Hypertensive heart disease with heart failure: Secondary | ICD-10-CM | POA: Insufficient documentation

## 2017-01-13 LAB — BASIC METABOLIC PANEL
Anion gap: 5 (ref 5–15)
BUN: 11 mg/dL (ref 6–20)
CALCIUM: 9.2 mg/dL (ref 8.9–10.3)
CO2: 27 mmol/L (ref 22–32)
CREATININE: 0.67 mg/dL (ref 0.44–1.00)
Chloride: 107 mmol/L (ref 101–111)
GFR calc Af Amer: >60 mL/min (ref >60)
GLUCOSE: 88 mg/dL (ref 65–99)
POTASSIUM: 4.4 mmol/L (ref 3.5–5.1)
SODIUM: 139 mmol/L (ref 135–145)

## 2017-01-13 LAB — CBC
HEMATOCRIT: 31.3 % — AB (ref 36.0–46.0)
Hemoglobin: 10 g/dL — ABNORMAL LOW (ref 12.0–15.0)
MCH: 26.7 pg (ref 26.0–34.0)
MCHC: 31.9 g/dL (ref 30.0–36.0)
MCV: 83.7 fL (ref 78.0–100.0)
PLATELETS: 331 10*3/uL (ref 150–400)
RBC: 3.74 MIL/uL — ABNORMAL LOW (ref 3.87–5.11)
RDW: 15.3 % (ref 11.5–15.5)
WBC: 4.8 10*3/uL (ref 4.0–10.5)

## 2017-01-13 LAB — PROTIME-INR
INR: 1.02
PROTHROMBIN TIME: 13.3 s (ref 11.4–15.2)

## 2017-01-13 MED ORDER — SACUBITRIL-VALSARTAN 97-103 MG PO TABS: 1.0000 | ORAL_TABLET | Freq: Two times a day (BID) | ORAL | 6 refills | Status: DC

## 2017-01-13 NOTE — Progress Notes (Signed)
Cardiac cath instructions reviewed with patient 

## 2017-01-13 NOTE — Patient Instructions (Signed)
INCREASE Entresto to 97/103 mg tablet twice daily. You may double up on your current 49/51 mg tablets (Take 2 tabs twice daily). New Rx has been sent to your pharmacy for 97/103 mg tablets (Take 1 tab twice daily).  Routine lab work today. Will notify you of abnormal results, otherwise no news is good news!  Will refer you to electrophysiology at [facility]. Address: [address] ([address] Floor), [place], [place] [address]  Phone: [phone]  You have been scheduled for a heart catheterization. Please see instruction sheet for additional details.  Follow up 1 month with Dr. Shirlee Latch.  __________________________________________________________ [place] Code: 7262  Take all medication as prescribed the day of your appointment. Bring all medications with you to your appointment.  Do the following things EVERYDAY: 1) Weigh yourself in the morning before breakfast. Write it down and keep it in a log. 2) Take your medicines as prescribed 3) Eat low salt foods-Limit salt (sodium) to 2000 mg per day.  4) Stay as active as you can everyday 5) Limit all fluids for the day to less than 2 liters

## 2017-01-14 NOTE — Progress Notes (Signed)
PCP: Dr. Hyman Hopes Cardiology: Dr. Clifton James HF Cardiology: Dr. Shirlee Latch  46 yo with history of nonischemic cardiomyopathy/chronic systolic CHF and rheumatoid arthritis was referred by Dr. Clifton James for evaluation/management of CHF.    Patient has had a cardiomyopathy known for a number of years now.  Coronary angiography in 2012 showed no significant disease.  EF has been in the 25-30% range long-term.  Most recent echo in 8/18 showed EF remains 25-30%.  She has no family history of cardiomyopathy and she has never been a heavy drinker.  She has not used illicit drugs.    Over the last 2 months, she has been increasingly symptomatic.  She is now short of breath after walking about 100 feet.  Lasix has been increased with some improvement, but still has cough and dyspnea.  She is short of breath with a flight of stairs.  She has chronic orthopnea and has slept on several pillows long-term.  No chest pain.  No palpitations.  No lightheadedness.   ECG (6/18, personally reviewed): NSR, anterolateral and inferior T wave inversions (narrow QRS)  REDS vest reading: 34%  Labs (9/18): K 4.4, creatinine 0.81  PMH: 1. Rheumatoid arthritis: Followed by Dr. Lendon Colonel.  2. Type II diabetes.  3. HTN 4. Chronic systolic CHF: Nonischemic cardiomyopathy.  - LHC (2012): No significant CAD.  - Echo (7/13): EF 30-35% - Cardiac MRI (12/14): Mild LV dilation, EF 40%, normal RV size and systolic function, no late gadolinium enhancement.  - Echo (7/16): EF 25-30% - Echo (8/18): EF 25-30%  SH: Lives with mother.  Rare ETOH, no drugs.  No smoking.   Family History  Problem Relation Age of Onset  . Heart attack Mother   . CAD Mother   . Allergies Mother   . Breast cancer Mother   . Hypertension Unknown   . Diabetes Mellitus II Unknown    ROS: All systems reviewed and negative except as per HPI.   Current Outpatient Prescriptions  Medication Sig Dispense Refill  . albuterol (PROAIR HFA) 108 (90 BASE) MCG/ACT  inhaler Inhale 2 puffs into the lungs every 6 (six) hours as needed for wheezing or shortness of breath.    . esomeprazole (NEXIUM) 40 MG capsule Take 40 mg by mouth daily before breakfast.    . fluticasone (FLONASE) 50 MCG/ACT nasal spray Place 2 sprays into both nostrils daily.    . furosemide (LASIX) 80 MG tablet Take 80 mg by mouth 2 (two) times daily.    . hydroxychloroquine (PLAQUENIL) 200 MG tablet Take 200 mg by mouth daily.     Marland Kitchen KLOR-CON M20 20 MEQ tablet TAKE 1 TABLET BY MOUTH ONCE DAILY 90 tablet 3  . loratadine (CLARITIN) 10 MG tablet Take 10 mg by mouth daily.    . metFORMIN (GLUCOPHAGE) 500 MG tablet Take 500 mg by mouth 2 (two) times daily with a meal.    . methocarbamol (ROBAXIN) 500 MG tablet Take 500 mg by mouth 2 (two) times daily as needed. Muscle spasms    . metoprolol succinate (TOPROL-XL) 50 MG 24 hr tablet Take 1 tablet (50 mg total) by mouth daily. 90 tablet 3  . omeprazole (PRILOSEC) 40 MG capsule Take 40 mg by mouth daily.    . promethazine-codeine (PHENERGAN WITH CODEINE) 6.25-10 MG/5ML syrup Take 5 mLs by mouth every 6 (six) hours as needed for cough.    Marland Kitchen spironolactone (ALDACTONE) 25 MG tablet Take 1 tablet (25 mg total) by mouth daily. 30 tablet 3  . SYMBICORT 160-4.5  MCG/ACT inhaler Inhale 2 puffs into the lungs at bedtime.    . traMADol (ULTRAM) 50 MG tablet Take 1 tablet (50 mg total) by mouth 4 (four) times daily. pain 60 tablet 1  . sacubitril-valsartan (ENTRESTO) 97-103 MG Take 1 tablet by mouth 2 (two) times daily. 60 tablet 6   No current facility-administered medications for this encounter.    BP 124/73   Pulse 84   Wt 223 lb 4 oz (101.3 kg)   SpO2 100%   BMI 42.18 kg/m  General: NAD, obese Neck: Thick, JVP 8 cm, no thyromegaly or thyroid nodule.  Lungs: Clear to auscultation bilaterally with normal respiratory effort. CV: Nondisplaced PMI.  Heart regular S1/S2, no S3/S4, no murmur.  No peripheral edema.  No carotid bruit.  Normal pedal pulses.   Abdomen: Soft, nontender, no hepatosplenomegaly, no distention.  Skin: Intact without lesions or rashes.  Neurologic: Alert and oriented x 3.  Psych: Normal affect. Extremities: No clubbing or cyanosis.  HEENT: Normal.   Assessment/Plan: 1. Chronic systolic CHF: Nonischemic cardiomyopathy.  Etiology uncertain, possible prior viral myocarditis.  CMRI in 12/14 with no LGE noted.  No heavy drinking or drugs.  No history of familial cardiomyopathy.  She continues to report NYHA class III symptoms though exam is not impressive for volume overload.  REDS vest reading was 34%, which is not significantly elevated.   - Given ongoing dyspnea/fatigue, will arrange for RHC next week to assess filling pressures and cardiac output.  We discussed risks/benefits and she agrees to the procedure.  - Continue current Lasix, 80 mg bid, for now.  - Continue current Toprol XL and spironolactone.  - Increase Entresto to 97/103 bid.  This will likely elicit a bit more diuresis.  - Even though she has a nonischemic cardiomyopathy, given her young age and persistently low EF, I think that ICD placement would be reasonable. Will refer to EP, would like [employer] device for [employer].  - BMET in 10 days after increase in Entresto.  2. Cough: Chronic but manageable.  May be due to Entresto.  Will see if it gets worse with uptitration.  3. Rheumatoid arthritis: She sees a rheumatologist.   Lafayette Dunlevy 01/14/2017 11:12 AM   

## 2017-01-18 ENCOUNTER — Ambulatory Visit (HOSPITAL_COMMUNITY)
Admission: RE | Disposition: A | Discharge status: Home / Self Care | Payer: Self-pay | Source: Ambulatory Visit | Attending: Cardiology

## 2017-01-18 ENCOUNTER — Encounter (HOSPITAL_COMMUNITY): Payer: Self-pay | Admitting: *Deleted

## 2017-01-18 ENCOUNTER — Ambulatory Visit (HOSPITAL_COMMUNITY)
Admission: RE | Admit: 2017-01-18 | Discharge: 2017-01-18 | Disposition: A | Discharge status: Home / Self Care | Payer: PPO | Source: Ambulatory Visit | Attending: Cardiology | Admitting: Cardiology

## 2017-01-18 DIAGNOSIS — R0600 Dyspnea, unspecified: Secondary | ICD-10-CM | POA: Diagnosis not present

## 2017-01-18 DIAGNOSIS — E119 Type 2 diabetes mellitus without complications: Secondary | ICD-10-CM | POA: Insufficient documentation

## 2017-01-18 DIAGNOSIS — I5022 Chronic systolic (congestive) heart failure: Secondary | ICD-10-CM | POA: Diagnosis not present

## 2017-01-18 DIAGNOSIS — Z79899 Other long term (current) drug therapy: Secondary | ICD-10-CM | POA: Diagnosis not present

## 2017-01-18 DIAGNOSIS — M069 Rheumatoid arthritis, unspecified: Secondary | ICD-10-CM | POA: Diagnosis not present

## 2017-01-18 DIAGNOSIS — Z7984 Long term (current) use of oral hypoglycemic drugs: Secondary | ICD-10-CM | POA: Insufficient documentation

## 2017-01-18 DIAGNOSIS — I5023 Acute on chronic systolic (congestive) heart failure: Secondary | ICD-10-CM | POA: Diagnosis not present

## 2017-01-18 DIAGNOSIS — I429 Cardiomyopathy, unspecified: Secondary | ICD-10-CM | POA: Diagnosis not present

## 2017-01-18 DIAGNOSIS — I272 Pulmonary hypertension, unspecified: Secondary | ICD-10-CM | POA: Insufficient documentation

## 2017-01-18 DIAGNOSIS — I110 Hypertensive heart disease with heart failure: Secondary | ICD-10-CM | POA: Diagnosis not present

## 2017-01-18 DIAGNOSIS — I509 Heart failure, unspecified: Secondary | ICD-10-CM | POA: Insufficient documentation

## 2017-01-18 HISTORY — PX: RIGHT HEART CATH: CATH118263

## 2017-01-18 LAB — POCT I-STAT 3, VENOUS BLOOD GAS (G3P V)
ACID-BASE DEFICIT: 3 mmol/L — AB (ref 0.0–2.0)
Acid-base deficit: 3 mmol/L — ABNORMAL HIGH (ref 0.0–2.0)
BICARBONATE: 22.9 mmol/L (ref 20.0–28.0)
Bicarbonate: 22.9 mmol/L (ref 20.0–28.0)
O2 SAT: 65 %
O2 Saturation: 65 %
PH VEN: 7.324 (ref 7.250–7.430)
TCO2: 24 mmol/L (ref 22–32)
TCO2: 24 mmol/L (ref 22–32)
pCO2, Ven: 44.1 mmHg (ref 44.0–60.0)
pCO2, Ven: 44.5 mmHg (ref 44.0–60.0)
pH, Ven: 7.319 (ref 7.250–7.430)
pO2, Ven: 36 mmHg (ref 32.0–45.0)
pO2, Ven: 37 mmHg (ref 32.0–45.0)

## 2017-01-18 LAB — GLUCOSE, CAPILLARY
Glucose-Capillary: 132 mg/dL — ABNORMAL HIGH (ref 65–99)
Glucose-Capillary: 115 mg/dL — ABNORMAL HIGH (ref 65–99)

## 2017-01-18 LAB — PREGNANCY, URINE: Preg Test, Ur: NEGATIVE

## 2017-01-18 SURGERY — RIGHT HEART CATH
Anesthesia: LOCAL

## 2017-01-18 MED ORDER — MIDAZOLAM HCL 2 MG/2ML IJ SOLN
INTRAMUSCULAR | Status: AC
  Filled 2017-01-18: qty 2

## 2017-01-18 MED ORDER — LIDOCAINE HCL 2 % IJ SOLN
INTRAMUSCULAR | Status: AC
  Filled 2017-01-18: qty 10

## 2017-01-18 MED ORDER — SODIUM CHLORIDE 0.9% FLUSH: 3.0000 mL | Freq: Two times a day (BID) | INTRAVENOUS | Status: DC

## 2017-01-18 MED ORDER — SODIUM CHLORIDE 0.9 % IV SOLN: 250.0000 mL | INTRAVENOUS | Status: DC | PRN

## 2017-01-18 MED ORDER — HEPARIN (PORCINE) IN NACL 2-0.9 UNIT/ML-% IJ SOLN
INTRAMUSCULAR | Status: AC
  Filled 2017-01-18: qty 500

## 2017-01-18 MED ORDER — FENTANYL CITRATE (PF) 100 MCG/2ML IJ SOLN
INTRAMUSCULAR | Status: DC | PRN
  Administered 2017-01-18 (×2): 25 ug via INTRAVENOUS

## 2017-01-18 MED ORDER — LIDOCAINE HCL (PF) 1 % IJ SOLN
INTRAMUSCULAR | Status: DC | PRN
  Administered 2017-01-18: 18 mL via INTRADERMAL

## 2017-01-18 MED ORDER — FENTANYL CITRATE (PF) 100 MCG/2ML IJ SOLN
INTRAMUSCULAR | Status: AC
  Filled 2017-01-18: qty 2

## 2017-01-18 MED ORDER — ASPIRIN 81 MG PO CHEW
81.0000 mg | CHEWABLE_TABLET | ORAL | Status: AC
  Administered 2017-01-18: 81 mg via ORAL

## 2017-01-18 MED ORDER — SODIUM CHLORIDE 0.9% FLUSH: 3.0000 mL | INTRAVENOUS | Status: DC | PRN

## 2017-01-18 MED ORDER — ASPIRIN 81 MG PO CHEW
CHEWABLE_TABLET | ORAL | Status: AC
  Administered 2017-01-18: 81 mg via ORAL
  Filled 2017-01-18: qty 1

## 2017-01-18 MED ORDER — MIDAZOLAM HCL 2 MG/2ML IJ SOLN
INTRAMUSCULAR | Status: DC | PRN
  Administered 2017-01-18 (×2): 1 mg via INTRAVENOUS

## 2017-01-18 MED ORDER — HEPARIN (PORCINE) IN NACL 2-0.9 UNIT/ML-% IJ SOLN
INTRAMUSCULAR | Status: AC | PRN
  Administered 2017-01-18: 500 mL

## 2017-01-18 MED ORDER — SODIUM CHLORIDE 0.9 % IV SOLN: INTRAVENOUS | Status: DC

## 2017-01-18 SURGICAL SUPPLY — 8 items
CATH BALLN WEDGE 5F 110CM (CATHETERS) IMPLANT
CATH SWAN GANZ 7F STRAIGHT (CATHETERS) ×2 IMPLANT
COVER PRB 48X5XTLSCP FOLD TPE (BAG) ×1 IMPLANT
COVER PROBE 5X48 (BAG) ×1
PACK CARDIAC CATHETERIZATION (CUSTOM PROCEDURE TRAY) ×2 IMPLANT
SHEATH GLIDE SLENDER 4/5FR (SHEATH) ×2 IMPLANT
SHEATH PINNACLE 7F 10CM (SHEATH) ×2 IMPLANT
TRANSDUCER W/STOPCOCK (MISCELLANEOUS) ×2 IMPLANT

## 2017-01-18 NOTE — Interval H&P Note (Signed)
History and Physical Interval Note:  01/18/2017 7:25 AM  [name]  has presented today for surgery, with the diagnosis of hf  The various methods of treatment have been discussed with the patient and family. After consideration of risks, benefits and other options for treatment, the patient has consented to  Procedure(s): RIGHT HEART CATH (N/A) as a surgical intervention .  The patient's history has been reviewed, patient examined, no change in status, stable for surgery.  I have reviewed the patient's chart and labs.  Questions were answered to the patient's satisfaction.     [name] [employer]

## 2017-01-18 NOTE — Progress Notes (Signed)
Per [name] in cath lab, okay to bring pt over to cath lab with only one left AC IV. Cancel IV team consult per him as well. NT performing EKG now and will then take pt over.

## 2017-01-18 NOTE — H&P (View-Only) (Signed)
PCP: Dr. Hyman Hopes Cardiology: Dr. Clifton James HF Cardiology: Dr. Shirlee Latch  46 yo with history of nonischemic cardiomyopathy/chronic systolic CHF and rheumatoid arthritis was referred by Dr. Clifton James for evaluation/management of CHF.    Patient has had a cardiomyopathy known for a number of years now.  Coronary angiography in 2012 showed no significant disease.  EF has been in the 25-30% range long-term.  Most recent echo in 8/18 showed EF remains 25-30%.  She has no family history of cardiomyopathy and she has never been a heavy drinker.  She has not used illicit drugs.    Over the last 2 months, she has been increasingly symptomatic.  She is now short of breath after walking about 100 feet.  Lasix has been increased with some improvement, but still has cough and dyspnea.  She is short of breath with a flight of stairs.  She has chronic orthopnea and has slept on several pillows long-term.  No chest pain.  No palpitations.  No lightheadedness.   ECG (6/18, personally reviewed): NSR, anterolateral and inferior T wave inversions (narrow QRS)  REDS vest reading: 34%  Labs (9/18): K 4.4, creatinine 0.81  PMH: 1. Rheumatoid arthritis: Followed by Dr. Lendon Colonel.  2. Type II diabetes.  3. HTN 4. Chronic systolic CHF: Nonischemic cardiomyopathy.  - LHC (2012): No significant CAD.  - Echo (7/13): EF 30-35% - Cardiac MRI (12/14): Mild LV dilation, EF 40%, normal RV size and systolic function, no late gadolinium enhancement.  - Echo (7/16): EF 25-30% - Echo (8/18): EF 25-30%  SH: Lives with mother.  Rare ETOH, no drugs.  No smoking.   Family History  Problem Relation Age of Onset  . Heart attack Mother   . CAD Mother   . Allergies Mother   . Breast cancer Mother   . Hypertension Unknown   . Diabetes Mellitus II Unknown    ROS: All systems reviewed and negative except as per HPI.   Current Outpatient Prescriptions  Medication Sig Dispense Refill  . albuterol (PROAIR HFA) 108 (90 BASE) MCG/ACT  inhaler Inhale 2 puffs into the lungs every 6 (six) hours as needed for wheezing or shortness of breath.    . esomeprazole (NEXIUM) 40 MG capsule Take 40 mg by mouth daily before breakfast.    . fluticasone (FLONASE) 50 MCG/ACT nasal spray Place 2 sprays into both nostrils daily.    . furosemide (LASIX) 80 MG tablet Take 80 mg by mouth 2 (two) times daily.    . hydroxychloroquine (PLAQUENIL) 200 MG tablet Take 200 mg by mouth daily.     Marland Kitchen KLOR-CON M20 20 MEQ tablet TAKE 1 TABLET BY MOUTH ONCE DAILY 90 tablet 3  . loratadine (CLARITIN) 10 MG tablet Take 10 mg by mouth daily.    . metFORMIN (GLUCOPHAGE) 500 MG tablet Take 500 mg by mouth 2 (two) times daily with a meal.    . methocarbamol (ROBAXIN) 500 MG tablet Take 500 mg by mouth 2 (two) times daily as needed. Muscle spasms    . metoprolol succinate (TOPROL-XL) 50 MG 24 hr tablet Take 1 tablet (50 mg total) by mouth daily. 90 tablet 3  . omeprazole (PRILOSEC) 40 MG capsule Take 40 mg by mouth daily.    . promethazine-codeine (PHENERGAN WITH CODEINE) 6.25-10 MG/5ML syrup Take 5 mLs by mouth every 6 (six) hours as needed for cough.    Marland Kitchen spironolactone (ALDACTONE) 25 MG tablet Take 1 tablet (25 mg total) by mouth daily. 30 tablet 3  . SYMBICORT 160-4.5  MCG/ACT inhaler Inhale 2 puffs into the lungs at bedtime.    . traMADol (ULTRAM) 50 MG tablet Take 1 tablet (50 mg total) by mouth 4 (four) times daily. pain 60 tablet 1  . sacubitril-valsartan (ENTRESTO) 97-103 MG Take 1 tablet by mouth 2 (two) times daily. 60 tablet 6   No current facility-administered medications for this encounter.    BP 124/73   Pulse 84   Wt 223 lb 4 oz (101.3 kg)   SpO2 100%   BMI 42.18 kg/m  General: NAD, obese Neck: Thick, JVP 8 cm, no thyromegaly or thyroid nodule.  Lungs: Clear to auscultation bilaterally with normal respiratory effort. CV: Nondisplaced PMI.  Heart regular S1/S2, no S3/S4, no murmur.  No peripheral edema.  No carotid bruit.  Normal pedal pulses.   Abdomen: Soft, nontender, no hepatosplenomegaly, no distention.  Skin: Intact without lesions or rashes.  Neurologic: Alert and oriented x 3.  Psych: Normal affect. Extremities: No clubbing or cyanosis.  HEENT: Normal.   Assessment/Plan: 1. Chronic systolic CHF: Nonischemic cardiomyopathy.  Etiology uncertain, possible prior viral myocarditis.  CMRI in 12/14 with no LGE noted.  No heavy drinking or drugs.  No history of familial cardiomyopathy.  She continues to report NYHA class III symptoms though exam is not impressive for volume overload.  REDS vest reading was 34%, which is not significantly elevated.   - Given ongoing dyspnea/fatigue, will arrange for RHC next week to assess filling pressures and cardiac output.  We discussed risks/benefits and she agrees to the procedure.  - Continue current Lasix, 80 mg bid, for now.  - Continue current Toprol XL and spironolactone.  - Increase Entresto to 97/103 bid.  This will likely elicit a bit more diuresis.  - Even though she has a nonischemic cardiomyopathy, given her young age and persistently low EF, I think that ICD placement would be reasonable. Will refer to EP, would like Research officer, political party for [employer].  - BMET in 10 days after increase in [place].  2. Cough: Chronic but manageable.  May be due to [facility].  Will see if it gets worse with uptitration.  3. Rheumatoid arthritis: She sees a rheumatologist.   Marca Ancona 01/14/2017 11:12 AM

## 2017-01-18 NOTE — Progress Notes (Addendum)
Site area: RFV Site Prior to Removal:  Level 0 Pressure Applied For:20 min Manual:   yes Patient Status During Pull:  stable Post Pull Site:  Level 0 Post Pull Instructions Given:  yes Post Pull Pulses Present: palpable Dressing Applied:  tegaderm Bedrest begins @ 8003 [id number] Comments:

## 2017-01-18 NOTE — Discharge Instructions (Signed)
Femoral Site Care Increase Lasix to 120mg  (3 Tablets) in am and 80mg  (2 Tablets) in pm Refer to this sheet in the next few weeks. These instructions provide you with information about caring for yourself after your procedure. Your health care provider may also give you more specific instructions. Your treatment has been planned according to current medical practices, but problems sometimes occur. Call your health care provider if you have any problems or questions after your procedure. What can I expect after the procedure? After your procedure, it is typical to have the following:  Bruising at the site that usually fades within 1-2 weeks.  Blood collecting in the tissue (hematoma) that may be painful to the touch. It should usually decrease in size and tenderness within 1-2 weeks.  Follow these instructions at home:  Take medicines only as directed by your health care provider.  You may shower 24-48 hours after the procedure or as directed by your health care provider. Remove the bandage (dressing) and gently wash the site with plain soap and water. Pat the area dry with a clean towel. Do not rub the site, because this may cause bleeding.  Do not take baths, swim, or use a hot tub until your health care provider approves.  Check your insertion site every day for redness, swelling, or drainage.  Do not apply powder or lotion to the site.  Limit use of stairs to twice a day for the first 2-3 days or as directed by your health care provider.  Do not squat for the first 2-3 days or as directed by your health care provider.  Do not lift over 10 lb (4.5 kg) for 5 days after your procedure or as directed by your health care provider.  Ask your health care provider when it is okay to: ? Return to work or school. ? Resume usual physical activities or sports. ? Resume sexual activity.  Do not drive home if you are discharged the same day as the procedure. Have someone else drive you.  You  may drive 24 hours after the procedure unless otherwise instructed by your health care provider.  Do not operate machinery or power tools for 24 hours after the procedure or as directed by your health care provider.  If your procedure was done as an outpatient procedure, which means that you went home the same day as your procedure, a responsible adult should be with you for the first 24 hours after you arrive home.  Keep all follow-up visits as directed by your health care provider. This is important. Contact a health care provider if:  You have a fever.  You have chills.  You have increased bleeding from the site. Hold pressure on the site. Get help right away if:  You have unusual pain at the site.  You have redness, warmth, or swelling at the site.  You have drainage (other than a small amount of blood on the dressing) from the site.  The site is bleeding, and the bleeding does not stop after 30 minutes of holding steady pressure on the site.  Your leg or foot becomes pale, cool, tingly, or numb. This information is not intended to replace advice given to you by your health care provider. Make sure you discuss any questions you have with your health care provider. Document Released: 11/30/2013 Document Revised: 09/04/2015 Document Reviewed: 10/16/2013 Elsevier Interactive Patient Education  09/06/2015.

## 2017-01-19 MED FILL — Lidocaine HCl Local Inj 2%: INTRAMUSCULAR | Qty: 10 | Status: AC

## 2017-01-24 ENCOUNTER — Inpatient Hospital Stay (HOSPITAL_COMMUNITY): Admit: 2017-01-24 | Payer: PPO

## 2017-01-24 ENCOUNTER — Other Ambulatory Visit (HOSPITAL_COMMUNITY): Payer: Self-pay | Admitting: *Deleted

## 2017-01-24 DIAGNOSIS — I5022 Chronic systolic (congestive) heart failure: Secondary | ICD-10-CM

## 2017-01-24 NOTE — Progress Notes (Signed)
bmet ordered per Dr. Shirlee Latch

## 2017-01-25 ENCOUNTER — Ambulatory Visit (HOSPITAL_COMMUNITY)
Admission: RE | Admit: 2017-01-25 | Discharge: 2017-01-25 | Disposition: A | Discharge status: Home / Self Care | Payer: PPO | Source: Ambulatory Visit | Attending: Internal Medicine | Admitting: Internal Medicine

## 2017-01-25 DIAGNOSIS — I5022 Chronic systolic (congestive) heart failure: Secondary | ICD-10-CM

## 2017-01-25 LAB — BASIC METABOLIC PANEL
ANION GAP: 9 (ref 5–15)
BUN: 16 mg/dL (ref 6–20)
CALCIUM: 9.1 mg/dL (ref 8.9–10.3)
CO2: 25 mmol/L (ref 22–32)
Chloride: 101 mmol/L (ref 101–111)
Creatinine, Ser: 0.88 mg/dL (ref 0.44–1.00)
Glucose, Bld: 111 mg/dL — ABNORMAL HIGH (ref 65–99)
POTASSIUM: 4 mmol/L (ref 3.5–5.1)
SODIUM: 135 mmol/L (ref 135–145)

## 2017-01-31 ENCOUNTER — Encounter (HOSPITAL_COMMUNITY): Payer: PPO

## 2017-02-03 ENCOUNTER — Other Ambulatory Visit (HOSPITAL_COMMUNITY)
Admission: RE | Admit: 2017-02-03 | Discharge: 2017-02-03 | Disposition: A | Discharge status: Home / Self Care | Payer: PPO | Source: Ambulatory Visit | Attending: Obstetrics and Gynecology | Admitting: Obstetrics and Gynecology

## 2017-02-03 ENCOUNTER — Other Ambulatory Visit: Payer: Self-pay | Admitting: Obstetrics and Gynecology

## 2017-02-03 DIAGNOSIS — Z9189 Other specified personal risk factors, not elsewhere classified: Secondary | ICD-10-CM | POA: Diagnosis not present

## 2017-02-03 DIAGNOSIS — Z8619 Personal history of other infectious and parasitic diseases: Secondary | ICD-10-CM | POA: Diagnosis not present

## 2017-02-03 DIAGNOSIS — Z124 Encounter for screening for malignant neoplasm of cervix: Secondary | ICD-10-CM | POA: Insufficient documentation

## 2017-02-03 DIAGNOSIS — Z01411 Encounter for gynecological examination (general) (routine) with abnormal findings: Secondary | ICD-10-CM | POA: Diagnosis not present

## 2017-02-03 DIAGNOSIS — R232 Flushing: Secondary | ICD-10-CM | POA: Diagnosis not present

## 2017-02-08 LAB — CYTOLOGY - PAP
Diagnosis: NEGATIVE
HPV (WINDOPATH): DETECTED — AB
HPV 16/18/45 genotyping: NEGATIVE

## 2017-02-11 DIAGNOSIS — Z23 Encounter for immunization: Secondary | ICD-10-CM | POA: Diagnosis not present

## 2017-02-11 DIAGNOSIS — I5022 Chronic systolic (congestive) heart failure: Secondary | ICD-10-CM | POA: Diagnosis not present

## 2017-02-11 DIAGNOSIS — M069 Rheumatoid arthritis, unspecified: Secondary | ICD-10-CM | POA: Diagnosis not present

## 2017-02-11 DIAGNOSIS — M329 Systemic lupus erythematosus, unspecified: Secondary | ICD-10-CM | POA: Diagnosis not present

## 2017-02-11 DIAGNOSIS — I10 Essential (primary) hypertension: Secondary | ICD-10-CM | POA: Diagnosis not present

## 2017-02-11 DIAGNOSIS — Z6841 Body Mass Index (BMI) 40.0 and over, adult: Secondary | ICD-10-CM | POA: Diagnosis not present

## 2017-02-11 DIAGNOSIS — E119 Type 2 diabetes mellitus without complications: Secondary | ICD-10-CM | POA: Diagnosis not present

## 2017-02-11 DIAGNOSIS — J45909 Unspecified asthma, uncomplicated: Secondary | ICD-10-CM | POA: Diagnosis not present

## 2017-02-21 ENCOUNTER — Inpatient Hospital Stay (HOSPITAL_COMMUNITY): Admission: RE | Admit: 2017-02-21 | Payer: PPO | Source: Ambulatory Visit | Admitting: Cardiology

## 2017-02-21 ENCOUNTER — Encounter: Payer: Self-pay | Admitting: Cardiology

## 2017-03-08 ENCOUNTER — Encounter (INDEPENDENT_AMBULATORY_CARE_PROVIDER_SITE_OTHER): Payer: Self-pay

## 2017-03-08 ENCOUNTER — Encounter: Payer: Self-pay | Admitting: Cardiology

## 2017-03-08 ENCOUNTER — Ambulatory Visit (INDEPENDENT_AMBULATORY_CARE_PROVIDER_SITE_OTHER): Payer: PPO | Admitting: Cardiology

## 2017-03-08 VITALS — BP 120/76 | HR 80 | Ht 61.0 in | Wt 220.0 lb

## 2017-03-08 DIAGNOSIS — I428 Other cardiomyopathies: Secondary | ICD-10-CM | POA: Diagnosis not present

## 2017-03-08 DIAGNOSIS — I5022 Chronic systolic (congestive) heart failure: Secondary | ICD-10-CM

## 2017-03-08 NOTE — Progress Notes (Signed)
Electrophysiology Office Note   Date:  03/08/2017   ID:  [name], DOB 1971/03/14, MRN [id number]  PCP:  Shirlean Mylar, MD  Cardiologist:  Janett Labella Primary Electrophysiologist:  Gustavo Meditz Jorja Loa, MD    Chief Complaint  Patient presents with  . Advice Only    Nonischemic cardiomyopathy/Chronic sytolic CHF     History of Present Illness: [name] is a 46 y.o. female who is being seen today for the evaluation of CHF at the request of Shirlean Mylar, MD. Presenting today for electrophysiology evaluation.  She has a history of rheumatoid arthritis, type 2 diabetes, hypertension, chronic systolic heart failure due to nonischemic cardiomyopathy.  Coronary angiography in 2012 showed no significant coronary disease.  Ejection fraction has been 25-30% for many years.  Over the last 2 months, she has become increasingly symptomatic with shortness of breath after walking 100 feet.  Her Lasix has been increased with some improvement.    Today, she denies symptoms of palpitations, chest pain, orthopnea, PND, lower extremity edema, claudication, dizziness, presyncope, syncope, bleeding, or neurologic sequela. The patient is tolerating medications without difficulties.  She has been having shortness of breath with exertion.  She was recently put on Lasix which has helped her shortness of breath.  She is also had a mild chronic cough.  Otherwise she feels well without major complaint.   Past Medical History:  Diagnosis Date  . Allergy   . Asthma   . Cardiomyopathy    Non-ischemic  . Diabetes mellitus without complication (HCC)   . Herpes simplex of female genitalia   . Hypertension   . Rheumatoid arthritis(714.0)    Past Surgical History:  Procedure Laterality Date  . DENTAL SURGERY    . OVARY SURGERY    . RIGHT HEART CATH N/A 01/18/2017   Procedure: RIGHT HEART CATH;  Surgeon: Laurey Morale, MD;  [facility]: [facility];  Service: Cardiovascular;  Laterality: N/A;       Current Outpatient Medications  Medication Sig Dispense Refill  . albuterol (PROAIR HFA) 108 (90 BASE) MCG/ACT inhaler Inhale 2 puffs into the lungs every 6 (six) hours as needed for wheezing or shortness of breath.    Marland Kitchen albuterol (PROVENTIL) (2.5 MG/3ML) 0.083% nebulizer solution Take 2.5 mg by nebulization every 6 (six) hours as needed for wheezing or shortness of breath.    . ferrous gluconate (FERGON) 324 MG tablet Take 324 mg by mouth daily.    . fluticasone (FLONASE) 50 MCG/ACT nasal spray Place 2 sprays into both nostrils daily as needed (for allergies.).     Marland Kitchen folic acid (FOLVITE) 1 MG tablet Take 1 mg by mouth daily.    . furosemide (LASIX) 40 MG tablet Take 3 tablets (120mg ) in the mornings and 2 tablets (80mg ) in the evenings by mouth daily    . hydroxychloroquine (PLAQUENIL) 200 MG tablet Take 400 mg by mouth daily.     KLOR-CON M20 20 MEQ tablet TAKE 1 TABLET BY MOUTH ONCE DAILY 90 tablet 3  . levocetirizine (XYZAL) 5 MG tablet Take 5 mg by mouth at bedtime.     . metFORMIN (GLUCOPHAGE) 500 MG tablet Take 1,000 mg by mouth 2 (two) times daily with a meal.     . methocarbamol (ROBAXIN) 500 MG tablet Take 500 mg by mouth 2 (two) times daily as needed (for muscle spasms.).     Methotrexate, PF, 25 MG/0.5ML SOAJ Inject 0.7 mLs into the skin once a week. On  Friday    . metoprolol succinate (TOPROL-XL) 25 MG 24 hr tablet Take 25 mg by mouth daily.    Marland Kitchen omeprazole (PRILOSEC) 40 MG capsule Take 40 mg by mouth daily before breakfast.     . sacubitril-valsartan (ENTRESTO) 97-103 MG Take 1 tablet by mouth 2 (two) times daily. 60 tablet 6  . spironolactone (ALDACTONE) 25 MG tablet Take 1 tablet (25 mg total) by mouth daily. 30 tablet 3  . SYMBICORT 160-4.5 MCG/ACT inhaler Inhale 2 puffs into the lungs daily as needed (for respiratory issues.).     Marland Kitchen traMADol (ULTRAM) 50 MG tablet Take 1 tablet (50 mg total) by mouth 4 (four) times daily. pain (Patient taking differently: Take 50 mg by  mouth 4 (four) times daily as needed (for pain.). ) 60 tablet 1  . triamcinolone cream (KENALOG) 0.1 % Apply 1 application topically 2 (two) times daily as needed (for ezcema.).    Marland Kitchen valACYclovir (VALTREX) 500 MG tablet Take 500 mg by mouth 2 (two) times daily as needed (for cold sores.).     No current facility-administered medications for this visit.     Allergies:   Clarithromycin; Hydrocodone; Ondansetron; Penicillins; and Tdap [diphth-acell pertussis-tetanus]   Social History:  The patient  reports that  has never smoked. she has never used smokeless tobacco. She reports that she does not drink alcohol or use drugs.   Family History:  The patient's family history includes Allergies in her mother; Breast cancer in her mother; CAD in her mother; Diabetes Mellitus II in her unknown relative; Heart attack in her mother; Hypertension in her unknown relative.    ROS:  Please see the history of present illness.   Otherwise, review of systems is positive for cough, constipation.   All other systems are reviewed and negative.    PHYSICAL EXAM: VS:  BP 120/76   Pulse 80   Ht 5\' 1"  (1.549 m)   Wt 220 lb (99.8 kg)   SpO2 98%   BMI 41.57 kg/m  , BMI Body mass index is 41.57 kg/m. GEN: Well nourished, well developed, in no acute distress  HEENT: normal  Neck: no JVD, carotid bruits, or masses Cardiac: RRR; no murmurs, rubs, or gallops,no edema  Respiratory:  clear to auscultation bilaterally, normal work of breathing GI: soft, nontender, nondistended, + BS MS: no deformity or atrophy  Skin: warm and dry Neuro:  Strength and sensation are intact Psych: euthymic mood, full affect  EKG:  EKG is not ordered today. Personal review of the ekg ordered 01/18/17 shows SR, LVH by voltage, rate 81  Recent Labs: 01/13/2017: Hemoglobin 10.0; Platelets 331 01/25/2017: BUN 16; Creatinine, Ser 0.88; Potassium 4.0; Sodium 135    Lipid Panel     Component Value Date/Time   CHOL 139 03/03/2012 1215    TRIG 115 03/03/2012 1215   HDL 40 03/03/2012 1215   CHOLHDL 3.5 03/03/2012 1215   VLDL 23 03/03/2012 1215   LDLCALC 76 03/03/2012 1215     Wt Readings from Last 3 Encounters:  03/08/17 220 lb (99.8 kg)  01/18/17 220 lb (99.8 kg)  01/13/17 223 lb 4 oz (101.3 kg)      Other studies Reviewed: Additional studies/ records that were reviewed today include: TTE 11/30/16  Review of the above records today demonstrates:  - Left ventricle: The cavity size was normal. Wall thickness was   normal. Systolic function was severely reduced. The estimated   ejection fraction was in the range of 25% to 30%.  Diffuse   hypokinesis. Doppler parameters are consistent with a reversible   restrictive pattern, indicative of decreased left ventricular   diastolic compliance and/or increased left atrial pressure (grade   3 diastolic dysfunction). - Aortic valve: Mildly calcified annulus. Mildly thickened, mildly   calcified leaflets.  Holter 11/08/16 Sinus rhythm with premature atrial contractions and rare premature ventricular contractions.  MRI 2014 1.  Mildly dilated LV with moderate global hypokinesis, EF 40%.  2.  Normal RV size and systolic function.  3. No myocardial delayed enhancement, so no definitive evidence for prior MI, infiltrative disease, or myocarditis.  ASSESSMENT AND PLAN:  1.  Systolic heart failure due to nonischemic cardiomyopathy: Etiology currently unknown.  Currently having symptoms of dyspnea and fatigue.  Currently on optimal medical therapy with Toprol-XL, Aldactone, and Entresto.  Also on Lasix.  I discussed with her the possibility of ICD therapy.  Risks and benefits include bleeding, tamponade, infection, pneumothorax among others.  The patient understands these risks and would like to continue to consider the procedure.  I Serenah Mill see her back in 1 month for further discussion.     Current medicines are reviewed at length with the patient today.   The patient does  not have concerns regarding her medicines.  The following changes were made today:  none  Labs/ tests ordered today include:  No orders of the defined types were placed in this encounter.    Disposition:   FU with Takyah Ciaramitaro 1 month  Signed, Lashina Milles Jorja Loa, MD  03/08/2017 10:46 AM     [facility] [address] [place] [address] [place] [address] [phone] (office) [phone] (fax)

## 2017-03-08 NOTE — Patient Instructions (Signed)
Medication Instructions:  Your physician recommends that you continue on your current medications as directed. Please refer to the Current Medication list given to you today.     * If you need a refill on your cardiac medications before your next appointment, please call your pharmacy. *  Labwork: None ordered  Testing/Procedures: None ordered  Follow-Up: Your physician recommends that you schedule a follow-up appointment in: 1 month with Dr. Elberta Fortis.  Thank you for choosing [facility]!!   Dory Horn, RN [phone]  Any Other Special Instructions Will Be Listed Below (If Applicable).   Cardioverter Defibrillator Implantation An implantable cardioverter defibrillator (ICD) is a small, lightweight, battery-powered device that is placed (implanted) under the skin in the chest or abdomen. Your caregiver may prescribe an ICD if:  You have had an irregular heart rhythm (arrhythmia) that originated in the lower chambers of the heart (ventricles).  Your heart has been damaged by a disease (such as coronary artery disease) or heart condition (such as a heart attack). An ICD consists of a battery that lasts several years, a small computer called a pulse generator, and wires called leads that go into the heart. It is used to detect and correct two dangerous arrhythmias: a rapid heart rhythm (tachycardia) and an arrhythmia in which the ventricles contract in an uncoordinated way (fibrillation). When an ICD detects tachycardia, it sends an electrical signal to the heart that restores the heartbeat to normal (cardioversion). This signal is usually painless. If cardioversion does not work or if the ICD detects fibrillation, it delivers a small electrical shock to the heart (defibrillation) to restart the heart. The shock may feel like a strong jolt in the chest.ICDs may be programmed to correct other problems. Sometimes, ICDs are programmed to act as another type of implantable device called  a pacemaker. Pacemakers are used to treat a slow heartbeat (bradycardia). LET YOUR CAREGIVER KNOW ABOUT:  Any allergies you have.  All medicines you are taking, including vitamins, herbs, eyedrops, and over-the-counter medicines and creams.  Previous problems you or members of your family have had with the use of anesthetics.  Any blood disorders you have had.  Other health problems you have. RISKS AND COMPLICATIONS Generally, the procedure to implant an ICD is safe. However, as with any surgical procedure, complications can occur. Possible complications associated with implanting an ICD include:  Swelling, bleeding, or bruising at the site where the ICD was implanted.  Infection at the site where the ICD was implanted.  A reaction to medicine used during the procedure.  Nerve, heart, or blood vessel damage.  Blood clots. BEFORE THE PROCEDURE  You may need to have blood tests, heart tests, or a chest X-ray done before the day of the procedure.  Ask your caregiver about changing or stopping your regular medicines.  Make plans to have someone drive you home. You may need to stay in the hospital overnight after the procedure.  Stop smoking at least 24 hours before the procedure.  Take a bath or shower the night before the procedure. You may need to scrub your chest or abdomen with a special type of soap.  Do not eat or drink before your procedure for as long as directed by your caregiver. Ask if it is okay to take any needed medicine with a small sip of water. PROCEDURE  The procedure to implant an ICD in your chest or abdomen is usually done at a hospital in a room that has a large X-ray machine  called a fluoroscope. The machine will be above you during the procedure. It will help your caregiver see your heart during the procedure. Implanting an ICD usually takes 1-3 hours. Before the procedure:   Small monitors will be put on your body. They will be used to check your heart,  blood pressure, and oxygen level.  A needle will be put into a vein in your hand or arm. This is called an intravenous (IV) access tube. Fluids and medicine will flow directly into your body through the IV tube.  Your chest or abdomen will be cleaned with a germ-killing (antiseptic) solution. The area may be shaved.  You may be given medicine to help you relax (sedative).  You will be given a medicine called a local anesthetic. This medicine will make the surgical site numb while the ICD is implanted. You will be sleepy but awake during the procedure. After you are numb the procedure will begin. The caregiver will:  Make a small cut (incision). This will make a pocket deep under your skin that will hold the pulse generator.  Guide the leads through a large blood vessel into your heart and attach them to the heart muscles. Depending on the ICD, the leads may go into one ventricle or they may go to both ventricles and into an upper chamber of the heart (atrium).  Test the ICD.  Close the incision with stitches, glue, or staples. AFTER THE PROCEDURE  You may feel pain. Some pain is normal. It may last a few days.  You may stay in a recovery area until the local anesthetic has worn off. Your blood pressure and pulse will be checked often. You will be taken to a room where your heart will be monitored.  A chest X-ray will be taken. This is done to check that the cardioverter defibrillator is in the right place.  You may stay in the hospital overnight.  A slight bump may be seen over the skin where the ICD was placed. Sometimes, it is possible to feel the ICD under the skin. This is normal.  In the months and years afterward, your caregiver will check the device, the leads, and the battery every few months. Eventually, when the battery is low, the ICD will be replaced.   This information is not intended to replace advice given to you by your health care provider. Make sure you discuss any  questions you have with your health care provider.   Document Released: 12/19/2001 Document Revised: 01/17/2013 Document Reviewed: 04/17/2012 Elsevier Interactive Patient Education [employer]

## 2017-03-24 DIAGNOSIS — R232 Flushing: Secondary | ICD-10-CM | POA: Diagnosis not present

## 2017-03-31 DIAGNOSIS — M0579 Rheumatoid arthritis with rheumatoid factor of multiple sites without organ or systems involvement: Secondary | ICD-10-CM | POA: Diagnosis not present

## 2017-03-31 DIAGNOSIS — Z79899 Other long term (current) drug therapy: Secondary | ICD-10-CM | POA: Diagnosis not present

## 2017-04-01 ENCOUNTER — Telehealth: Payer: Self-pay | Admitting: *Deleted

## 2017-04-01 NOTE — Telephone Encounter (Signed)
Have left several messages for patient to arrange 1 month follow up w/ Dr. Elberta Fortis.

## 2017-04-08 NOTE — Telephone Encounter (Signed)
Attempted to reach patient again to schedule 1 month f/u. Will send message to Dr. Shirlee Latch for his FYI as pt is scheduled to see him on 04/19/17.

## 2017-04-19 ENCOUNTER — Encounter (HOSPITAL_COMMUNITY): Payer: Self-pay | Admitting: Cardiology

## 2017-04-19 ENCOUNTER — Ambulatory Visit (HOSPITAL_COMMUNITY)
Admission: RE | Admit: 2017-04-19 | Discharge: 2017-04-19 | Disposition: A | Discharge status: Home / Self Care | Payer: PPO | Source: Ambulatory Visit | Attending: Cardiology | Admitting: Cardiology

## 2017-04-19 ENCOUNTER — Other Ambulatory Visit: Payer: Self-pay

## 2017-04-19 VITALS — BP 150/83 | HR 80 | Wt 229.8 lb

## 2017-04-19 DIAGNOSIS — Z8249 Family history of ischemic heart disease and other diseases of the circulatory system: Secondary | ICD-10-CM | POA: Insufficient documentation

## 2017-04-19 DIAGNOSIS — Z95810 Presence of automatic (implantable) cardiac defibrillator: Secondary | ICD-10-CM | POA: Diagnosis not present

## 2017-04-19 DIAGNOSIS — D649 Anemia, unspecified: Secondary | ICD-10-CM | POA: Diagnosis not present

## 2017-04-19 DIAGNOSIS — Z79891 Long term (current) use of opiate analgesic: Secondary | ICD-10-CM | POA: Diagnosis not present

## 2017-04-19 DIAGNOSIS — I5022 Chronic systolic (congestive) heart failure: Secondary | ICD-10-CM | POA: Insufficient documentation

## 2017-04-19 DIAGNOSIS — I429 Cardiomyopathy, unspecified: Secondary | ICD-10-CM | POA: Insufficient documentation

## 2017-04-19 DIAGNOSIS — E119 Type 2 diabetes mellitus without complications: Secondary | ICD-10-CM | POA: Insufficient documentation

## 2017-04-19 DIAGNOSIS — Z79899 Other long term (current) drug therapy: Secondary | ICD-10-CM | POA: Insufficient documentation

## 2017-04-19 DIAGNOSIS — Z7984 Long term (current) use of oral hypoglycemic drugs: Secondary | ICD-10-CM | POA: Diagnosis not present

## 2017-04-19 DIAGNOSIS — I110 Hypertensive heart disease with heart failure: Secondary | ICD-10-CM | POA: Insufficient documentation

## 2017-04-19 DIAGNOSIS — R05 Cough: Secondary | ICD-10-CM | POA: Diagnosis not present

## 2017-04-19 DIAGNOSIS — M069 Rheumatoid arthritis, unspecified: Secondary | ICD-10-CM | POA: Insufficient documentation

## 2017-04-19 LAB — BASIC METABOLIC PANEL
ANION GAP: 7 (ref 5–15)
BUN: 10 mg/dL (ref 6–20)
CO2: 26 mmol/L (ref 22–32)
Calcium: 8.6 mg/dL — ABNORMAL LOW (ref 8.9–10.3)
Chloride: 106 mmol/L (ref 101–111)
Creatinine, Ser: 0.69 mg/dL (ref 0.44–1.00)
GFR calc Af Amer: >60 mL/min (ref >60)
Glucose, Bld: 117 mg/dL — ABNORMAL HIGH (ref 65–99)
POTASSIUM: 3.4 mmol/L — AB (ref 3.5–5.1)
SODIUM: 139 mmol/L (ref 135–145)

## 2017-04-19 MED ORDER — FUROSEMIDE 40 MG PO TABS: ORAL_TABLET | ORAL | 3 refills | Status: DC

## 2017-04-19 MED ORDER — METOPROLOL SUCCINATE ER 50 MG PO TB24: 50.0000 mg | ORAL_TABLET | Freq: Every day | ORAL | 3 refills | Status: DC

## 2017-04-19 MED ORDER — ISOSORB DINITRATE-HYDRALAZINE 20-37.5 MG PO TABS: 1.0000 | ORAL_TABLET | Freq: Three times a day (TID) | ORAL | 3 refills | Status: DC

## 2017-04-19 NOTE — Patient Instructions (Addendum)
Start Bidil 1 tab three times a day  Increase Metoprolol XL 50 mg (1 tab) daily  Take Furosimide 120 in AM (3 tabs), 80 mg (2 tab) in PM daily  Labs drawn today (if we do not call you, then your lab work was stable)   You have been referred to [facility] in 1 month   Your physician recommends that you return for lab work in: 2 weeks   Your physician recommends that you schedule a follow-up appointment in: 1 month with APP

## 2017-04-19 NOTE — Progress Notes (Signed)
PCP: Dr. Hyman Hopes Cardiology: Dr. Clifton James HF Cardiology: Dr. Shirlee Latch  47 y.o. with history of nonischemic cardiomyopathy/chronic systolic CHF and rheumatoid arthritis was referred by Dr. Clifton James for evaluation/management of CHF.    Patient has had a cardiomyopathy known for a number of years now.  Coronary angiography in 2012 showed no significant disease.  EF has been in the 25-30% range long-term.  Most recent echo in 8/18 showed EF remains 25-30%.  She has no family history of cardiomyopathy and she has never been a heavy drinker.  She has not used illicit drugs.  RHC in 10/18 showed preserved cardiac output with mildly increased filling pressures.  Lasix was increased to 120 qam/80 qpm.  She was seen by EP ([facility]) and wanted to think about ICD.   She returns for followup today.  She has not taken her Lasix for several days and feels like she is building up fluid.  She stopped Lasix because she felt like she had a cold.  Weight is up 6 lbs.  She took her other meds (BP elevated).  She has a chronic cough.  She is not short of breath walking on flat ground but gets short of breath walking up an incline.  She chronically sleeps on 4 pillows.    Labs (9/18): K 4.4, creatinine 0.81 Labs (10/18): K 4, creatinine 0.88  PMH: 1. Rheumatoid arthritis: Followed by Dr. Lendon Colonel.  2. Type II diabetes.  3. HTN 4. Chronic systolic CHF: Nonischemic cardiomyopathy.  - LHC (2012): No significant CAD.  - Echo (7/13): EF 30-35% - Cardiac MRI (12/14): Mild LV dilation, EF 40%, normal RV size and systolic function, no late gadolinium enhancement.  - Echo (7/16): EF 25-30% - Echo (8/18): EF 25-30% - RHC (10/18): mean RA 11, PA 47/20 mean 32, mean PCWP 16, CI 3.06 Fick/3.5 thermo, PVR 2.65 WU.   SH: Lives with mother.  Rare ETOH, no drugs.  No smoking.   Family History  Problem Relation Age of Onset  . Heart attack Mother   . CAD Mother   . Allergies Mother   . Breast cancer Mother   . Hypertension Unknown    . Diabetes Mellitus II Unknown    ROS: All systems reviewed and negative except as per HPI.   Current Outpatient Medications  Medication Sig Dispense Refill  . albuterol (PROAIR HFA) 108 (90 BASE) MCG/ACT inhaler Inhale 2 puffs into the lungs every 6 (six) hours as needed for wheezing or shortness of breath.    Marland Kitchen albuterol (PROVENTIL) (2.5 MG/3ML) 0.083% nebulizer solution Take 2.5 mg by nebulization every 6 (six) hours as needed for wheezing or shortness of breath.    . ferrous gluconate (FERGON) 324 MG tablet Take 324 mg by mouth daily.    . fluticasone (FLONASE) 50 MCG/ACT nasal spray Place 2 sprays into both nostrils daily as needed (for allergies.).     Marland Kitchen folic acid (FOLVITE) 1 MG tablet Take 1 mg by mouth daily.    . furosemide (LASIX) 40 MG tablet Take 3 tablets (120mg ) in the mornings and 2 tablets (80mg ) in the evenings by mouth daily 150 tablet 3  . hydroxychloroquine (PLAQUENIL) 200 MG tablet Take 400 mg by mouth daily.     KLOR-CON M20 20 MEQ tablet TAKE 1 TABLET BY MOUTH ONCE DAILY 90 tablet 3  . levocetirizine (XYZAL) 5 MG tablet Take 5 mg by mouth at bedtime.     . metFORMIN (GLUCOPHAGE) 500 MG tablet Take 1,000 mg by mouth 2 (  two) times daily with a meal.     . methocarbamol (ROBAXIN) 500 MG tablet Take 500 mg by mouth 2 (two) times daily as needed (for muscle spasms.).     Marland Kitchen Methotrexate, PF, 25 MG/0.5ML SOAJ Inject 0.7 mLs into the skin once a week. On Friday    . metoprolol succinate (TOPROL-XL) 50 MG 24 hr tablet Take 1 tablet (50 mg total) by mouth daily. 30 tablet 3  . omeprazole (PRILOSEC) 40 MG capsule Take 40 mg by mouth daily before breakfast.     . sacubitril-valsartan (ENTRESTO) 97-103 MG Take 1 tablet by mouth 2 (two) times daily. 60 tablet 6  . spironolactone (ALDACTONE) 25 MG tablet Take 1 tablet (25 mg total) by mouth daily. 30 tablet 3  . SYMBICORT 160-4.5 MCG/ACT inhaler Inhale 2 puffs into the lungs daily as needed (for respiratory issues.).     Marland Kitchen  traMADol (ULTRAM) 50 MG tablet Take 1 tablet (50 mg total) by mouth 4 (four) times daily. pain (Patient taking differently: Take 50 mg by mouth 4 (four) times daily as needed (for pain.). ) 60 tablet 1  . triamcinolone cream (KENALOG) 0.1 % Apply 1 application topically 2 (two) times daily as needed (for ezcema.).    Marland Kitchen valACYclovir (VALTREX) 500 MG tablet Take 500 mg by mouth 2 (two) times daily as needed (for cold sores.).    Marland Kitchen isosorbide-hydrALAZINE (BIDIL) 20-37.5 MG tablet Take 1 tablet by mouth 3 (three) times daily. 90 tablet 3   No current facility-administered medications for this encounter.    BP (!) 150/83   Pulse 80   Wt 229 lb 12 oz (104.2 kg)   SpO2 100%   BMI 43.41 kg/m  General: NAD, obese.  Neck: JVP 8-9 cm, no thyromegaly or thyroid nodule.  Lungs: Clear to auscultation bilaterally with normal respiratory effort. CV: Nondisplaced PMI.  Heart regular S1/S2, no S3/S4, no murmur.  No peripheral edema.  No carotid bruit.  Normal pedal pulses.  Abdomen: Soft, nontender, no hepatosplenomegaly, no distention.  Skin: Intact without lesions or rashes.  Neurologic: Alert and oriented x 3.  Psych: Normal affect. Extremities: No clubbing or cyanosis.  HEENT: Normal.   Assessment/Plan: 1. Chronic systolic CHF: Nonischemic cardiomyopathy.  Etiology uncertain, possible prior viral myocarditis.  CMRI in 12/14 with no LGE noted.  No heavy drinking or drugs.  No history of familial cardiomyopathy.  NYHA class II-III symptoms.  She is volume overloaded on exam in setting of no Lasix for about 3 days.  - I strongly encouraged her not to skip her diuretics.  She will restart Lasix 120 qam/80 qpm. BMET today and again in 2 wks.  - Continue current spironolactone.  - Increase Toprol XL to 50 mg daily.  - Add Bidil 1 tab tid.  - Continue Entresto 97/103 bid.  - Even though she has a nonischemic cardiomyopathy, given her young age and persistently low EF, I think that ICD placement would be  reasonable. Would like Development worker, community. She saw Dr Elberta Fortis and now tells me that she is willing to have ICD placed. I will refer her back to EP to set up device implant. Narrow QRS, not candidate for CRT.  2. Cough: Chronic but manageable.  May be due to [facility] vs fluid overload.  3. Rheumatoid arthritis: She sees a rheumatologist.   Followup with NP/PA in 1 month.   Marca Ancona 04/19/2017

## 2017-04-21 ENCOUNTER — Other Ambulatory Visit (HOSPITAL_COMMUNITY): Payer: Self-pay

## 2017-04-21 MED ORDER — POTASSIUM CHLORIDE CRYS ER 20 MEQ PO TBCR: 40.0000 meq | EXTENDED_RELEASE_TABLET | Freq: Every day | ORAL | 3 refills | Status: DC

## 2017-04-28 DIAGNOSIS — J329 Chronic sinusitis, unspecified: Secondary | ICD-10-CM | POA: Diagnosis not present

## 2017-05-03 ENCOUNTER — Ambulatory Visit (HOSPITAL_COMMUNITY)
Admission: RE | Admit: 2017-05-03 | Discharge: 2017-05-03 | Disposition: A | Discharge status: Home / Self Care | Payer: PPO | Source: Ambulatory Visit | Attending: Cardiology | Admitting: Cardiology

## 2017-05-03 ENCOUNTER — Telehealth (HOSPITAL_COMMUNITY): Payer: Self-pay | Admitting: *Deleted

## 2017-05-03 DIAGNOSIS — I5022 Chronic systolic (congestive) heart failure: Secondary | ICD-10-CM

## 2017-05-03 LAB — BASIC METABOLIC PANEL
Anion gap: 12 (ref 5–15)
BUN: 14 mg/dL (ref 6–20)
CHLORIDE: 103 mmol/L (ref 101–111)
CO2: 23 mmol/L (ref 22–32)
CREATININE: 0.66 mg/dL (ref 0.44–1.00)
Calcium: 9.1 mg/dL (ref 8.9–10.3)
Glucose, Bld: 139 mg/dL — ABNORMAL HIGH (ref 65–99)
POTASSIUM: 3.9 mmol/L (ref 3.5–5.1)
Sodium: 138 mmol/L (ref 135–145)

## 2017-05-03 MED ORDER — ISOSORB DINITRATE-HYDRALAZINE 20-37.5 MG PO TABS: 0.5000 | ORAL_TABLET | Freq: Three times a day (TID) | ORAL | 3 refills | Status: DC

## 2017-05-03 NOTE — Telephone Encounter (Signed)
Pt was in for lab work today and wanted to discuss issues with her BP and headaches.  Pt states she increased her Toprol and started Bidil 1 tab TID as ordered on 1/8 by Dr Shirlee Latch.  She states since then she has had a HA, she reports it is a little better but still there.  She also reports she saw her PCP on Thur 1/17 and they were concerned b/c her BP was 90/60, she denied any dizziness.  VS taken today, HR 81, sats 98%, BP 114/76.  Discussed all with Dr Shirlee Latch, he advised for pt to decrease Bidil to 1/2 tab TID to see if this helps her HA, pt is aware and agreeable, she is already scheduled for f/u on 05/20/17 and will keep that appt, will call back before then if needed.

## 2017-05-20 ENCOUNTER — Encounter (HOSPITAL_COMMUNITY): Payer: Self-pay

## 2017-05-20 ENCOUNTER — Ambulatory Visit (HOSPITAL_COMMUNITY)
Admission: RE | Admit: 2017-05-20 | Discharge: 2017-05-20 | Disposition: A | Discharge status: Home / Self Care | Payer: PPO | Source: Ambulatory Visit | Attending: Cardiology | Admitting: Cardiology

## 2017-05-20 VITALS — BP 118/60 | HR 67 | Wt 220.0 lb

## 2017-05-20 DIAGNOSIS — E119 Type 2 diabetes mellitus without complications: Secondary | ICD-10-CM | POA: Insufficient documentation

## 2017-05-20 DIAGNOSIS — Z7984 Long term (current) use of oral hypoglycemic drugs: Secondary | ICD-10-CM | POA: Diagnosis not present

## 2017-05-20 DIAGNOSIS — M069 Rheumatoid arthritis, unspecified: Secondary | ICD-10-CM | POA: Insufficient documentation

## 2017-05-20 DIAGNOSIS — I110 Hypertensive heart disease with heart failure: Secondary | ICD-10-CM | POA: Insufficient documentation

## 2017-05-20 DIAGNOSIS — I428 Other cardiomyopathies: Secondary | ICD-10-CM | POA: Diagnosis not present

## 2017-05-20 DIAGNOSIS — R05 Cough: Secondary | ICD-10-CM | POA: Insufficient documentation

## 2017-05-20 DIAGNOSIS — I5022 Chronic systolic (congestive) heart failure: Secondary | ICD-10-CM | POA: Diagnosis not present

## 2017-05-20 DIAGNOSIS — Z7951 Long term (current) use of inhaled steroids: Secondary | ICD-10-CM | POA: Diagnosis not present

## 2017-05-20 DIAGNOSIS — Z8249 Family history of ischemic heart disease and other diseases of the circulatory system: Secondary | ICD-10-CM | POA: Diagnosis not present

## 2017-05-20 DIAGNOSIS — Z79899 Other long term (current) drug therapy: Secondary | ICD-10-CM | POA: Diagnosis not present

## 2017-05-20 MED ORDER — METOPROLOL SUCCINATE ER 50 MG PO TB24: 50.0000 mg | ORAL_TABLET | Freq: Every day | ORAL | 3 refills | Status: DC

## 2017-05-20 MED ORDER — POTASSIUM CHLORIDE CRYS ER 20 MEQ PO TBCR: 40.0000 meq | EXTENDED_RELEASE_TABLET | Freq: Every day | ORAL | 3 refills | Status: DC

## 2017-05-20 MED ORDER — SACUBITRIL-VALSARTAN 97-103 MG PO TABS: 1.0000 | ORAL_TABLET | Freq: Two times a day (BID) | ORAL | 3 refills | Status: DC

## 2017-05-20 MED ORDER — ISOSORB DINITRATE-HYDRALAZINE 20-37.5 MG PO TABS: 2.0000 | ORAL_TABLET | Freq: Three times a day (TID) | ORAL | 3 refills | Status: DC

## 2017-05-20 MED ORDER — FUROSEMIDE 40 MG PO TABS: ORAL_TABLET | ORAL | 3 refills | Status: DC

## 2017-05-20 MED ORDER — SPIRONOLACTONE 25 MG PO TABS: 25.0000 mg | ORAL_TABLET | Freq: Every day | ORAL | 3 refills | Status: DC

## 2017-05-20 NOTE — Progress Notes (Signed)
[facility] Note   PCP: Dr. Hyman [name] Cardiology: Dr. Clifton [name] HF Cardiology: Dr. Elliot [name] is a 47 y.o. female with history of nonischemic cardiomyopathy/chronic systolic CHF and rheumatoid arthritis was referred by Dr. Clifton [name] for evaluation/management of CHF.    Patient has had a cardiomyopathy known for a number of years now.  Coronary angiography in 2012 showed no significant disease.  EF has been in the 25-30% range long-term.  Most recent echo in 8/18 showed EF remains 25-30%.  She has no family history of cardiomyopathy and she has never been a heavy drinker.  She has not used illicit drugs.  RHC in 10/18 showed preserved cardiac output with mildly increased filling pressures.  Lasix was increased to 120 qam/80 qpm.  She was seen by EP ([name]) and wanted to think about ICD.   She presents today for regular follow up. Feeling well overall. She did not stop bidil, but pushed through, and headaches have improved. Weight stable.  She denies SOB with activity. She chronically sleeps on 4 pillows.  She has been hesitant to schedule her ICD follow up as she needs someone to take care of her cats. Chronic cough is stable, but was recently exacerbated by sinus infection.   Labs (9/18): K 4.4, creatinine 0.81 Labs (10/18): K 4, creatinine 0.88  PMH: 1. Rheumatoid arthritis: Followed by Dr. Lendon [name].  2. Type II diabetes.  3. HTN 4. Chronic systolic CHF: Nonischemic cardiomyopathy.  - LHC (2012): No significant CAD.  - Echo (7/13): EF 30-35% - Cardiac MRI (12/14): Mild LV dilation, EF 40%, normal RV size and systolic function, no late gadolinium enhancement.  - Echo (7/16): EF 25-30% - Echo (8/18): EF 25-30% - RHC (10/18): mean RA 11, PA 47/20 mean 32, mean PCWP 16, CI 3.06 Fick/3.5 thermo, PVR 2.65 WU.   SH: Lives with mother.  Rare ETOH, no drugs.  No smoking.   Family History  Problem Relation Age of Onset  . Heart attack Mother   . CAD Mother   .  Allergies Mother   . Breast cancer Mother   . Hypertension Unknown   . Diabetes Mellitus II Unknown    Review of systems complete and found to be negative unless listed in HPI.    Current Outpatient Medications  Medication Sig Dispense Refill  . albuterol (PROAIR HFA) 108 (90 BASE) MCG/ACT inhaler Inhale 2 puffs into the lungs every 6 (six) hours as needed for wheezing or shortness of breath.    Marland Kitchen albuterol (PROVENTIL) (2.5 MG/3ML) 0.083% nebulizer solution Take 2.5 mg by nebulization every 6 (six) hours as needed for wheezing or shortness of breath.    . ferrous gluconate (FERGON) 324 MG tablet Take 324 mg by mouth daily.    . fluticasone (FLONASE) 50 MCG/ACT nasal spray Place 2 sprays into both nostrils daily as needed (for allergies.).     Marland Kitchen folic acid (FOLVITE) 1 MG tablet Take 1 mg by mouth daily.    . furosemide (LASIX) 40 MG tablet Take 3 tablets (120mg ) in the mornings and 2 tablets (80mg ) in the evenings by mouth daily 150 tablet 3  . hydroxychloroquine (PLAQUENIL) 200 MG tablet Take 400 mg by mouth daily.     . isosorbide-hydrALAZINE (BIDIL) 20-37.5 MG tablet Take 0.5 tablets by mouth 3 (three) times daily. 90 tablet 3  . levocetirizine (XYZAL) 5 MG tablet Take 5 mg by mouth at bedtime.     . metFORMIN (GLUCOPHAGE) 500 MG tablet Take 1,000  mg by mouth 2 (two) times daily with a meal.     . methocarbamol (ROBAXIN) 500 MG tablet Take 500 mg by mouth 2 (two) times daily as needed (for muscle spasms.).     Marland Kitchen Methotrexate, PF, 25 MG/0.5ML SOAJ Inject 0.7 mLs into the skin once a week. On Friday    . metoprolol succinate (TOPROL-XL) 50 MG 24 hr tablet Take 1 tablet (50 mg total) by mouth daily. 30 tablet 3  . omeprazole (PRILOSEC) 40 MG capsule Take 40 mg by mouth daily before breakfast.     . potassium chloride SA (KLOR-CON M20) 20 MEQ tablet Take 2 tablets (40 mEq total) by mouth daily. 60 tablet 3  . sacubitril-valsartan (ENTRESTO) 97-103 MG Take 1 tablet by mouth 2 (two) times daily.  60 tablet 6  . spironolactone (ALDACTONE) 25 MG tablet Take 1 tablet (25 mg total) by mouth daily. 30 tablet 3  . SYMBICORT 160-4.5 MCG/ACT inhaler Inhale 2 puffs into the lungs daily as needed (for respiratory issues.).     Marland Kitchen traMADol (ULTRAM) 50 MG tablet Take 1 tablet (50 mg total) by mouth 4 (four) times daily. pain (Patient taking differently: Take 50 mg by mouth 4 (four) times daily as needed (for pain.). ) 60 tablet 1  . triamcinolone cream (KENALOG) 0.1 % Apply 1 application topically 2 (two) times daily as needed (for ezcema.).    Marland Kitchen valACYclovir (VALTREX) 500 MG tablet Take 500 mg by mouth 2 (two) times daily as needed (for cold sores.).     No current facility-administered medications for this encounter.    BP 118/60 (BP Location: Right Arm, Patient Position: Sitting, Cuff Size: Large)   Pulse 67   Wt 220 lb (99.8 kg)   SpO2 100%   BMI 41.57 kg/m    Wt Readings from Last 3 Encounters:  05/20/17 220 lb (99.8 kg)  04/19/17 229 lb 12 oz (104.2 kg)  03/08/17 220 lb (99.8 kg)    Physical Exam General: Well appearing. No resp difficulty. HEENT: Normal Neck: Supple. JVP 5-6. Carotids 2+ bilat; no bruits. No thyromegaly or nodule noted. Cor: PMI nondisplaced. RRR, No M/G/R noted Lungs: CTAB, normal effort. Abdomen: Soft, non-tender, non-distended, no HSM. No bruits or masses. +BS  Extremities: No cyanosis, clubbing, or rash. R and LLE no edema.  Neuro: Alert & orientedx3, cranial nerves grossly intact. moves all 4 extremities w/o difficulty. Affect pleasant   Assessment/Plan: 1. Chronic systolic CHF: Nonischemic cardiomyopathy.  Etiology uncertain, possible prior viral myocarditis.  CMRI in 12/14 with no LGE noted.  No heavy drinking or drugs.  No history of familial cardiomyopathy. - NYHA class II-III symptoms.  - Volume status stable on exam.  - Continue lasix 120 qam/80 qpm. BMET today.  - Continue spironolactone 25 mg daily.  - Continue Toprol XL 50 mg daily.  - Increase  Bidil 2 tab TID.  - Continue Entresto 97/103 bid.  - Even though she has a nonischemic cardiomyopathy, given her young age and persistently low EF, think that ICD placement would be reasonable. Would like Development worker, community. She saw Dr Elberta Fortis and now tells me that she is willing to have ICD placed. Will refer back to EP to set up device implant. Narrow QRS, not candidate for CRT.  - Reinforced fluid restriction to < 2 L daily, sodium restriction to less than 2000 mg daily, and the importance of daily weights.   2. Cough:  - Chronic. Continue to follow.   3. Rheumatoid  arthritis:  - She sees a rheumatologist. No change.   Meds and labs as above. Will send to EP as above.  RTC 6 weeks.   Graciella Freer, PA-C  05/20/2017

## 2017-05-20 NOTE — Patient Instructions (Signed)
INCREASE Bidil to 2 tabs three times daily.  Refilled all cardiac mediations for 90 day supplies.  Will refer you to electrophysiology at [facility]. Address: [address] ([address]), [place], [place] [address]  Phone: [phone] Their office will contact you to schedule appointment by phone.  Follow up 6 weeks with Dr. Shirlee Latch.  ___________________________________________________________ [place] Code: 9002  Take all medication as prescribed the day of your appointment. Bring all medications with you to your appointment.  Do the following things EVERYDAY: 1) Weigh yourself in the morning before breakfast. Write it down and keep it in a log. 2) Take your medicines as prescribed 3) Eat low salt foods-Limit salt (sodium) to 2000 mg per day.  4) Stay as active as you can everyday 5) Limit all fluids for the day to less than 2 liters

## 2017-05-27 DIAGNOSIS — J0101 Acute recurrent maxillary sinusitis: Secondary | ICD-10-CM | POA: Diagnosis not present

## 2017-06-09 DIAGNOSIS — R112 Nausea with vomiting, unspecified: Secondary | ICD-10-CM | POA: Diagnosis not present

## 2017-06-09 DIAGNOSIS — D509 Iron deficiency anemia, unspecified: Secondary | ICD-10-CM | POA: Diagnosis not present

## 2017-06-09 DIAGNOSIS — R197 Diarrhea, unspecified: Secondary | ICD-10-CM | POA: Diagnosis not present

## 2017-06-09 DIAGNOSIS — R0982 Postnasal drip: Secondary | ICD-10-CM | POA: Diagnosis not present

## 2017-06-13 DIAGNOSIS — N951 Menopausal and female climacteric states: Secondary | ICD-10-CM | POA: Diagnosis not present

## 2017-06-17 DIAGNOSIS — K2950 Unspecified chronic gastritis without bleeding: Secondary | ICD-10-CM | POA: Diagnosis not present

## 2017-07-08 ENCOUNTER — Other Ambulatory Visit: Payer: Self-pay

## 2017-07-08 ENCOUNTER — Encounter (HOSPITAL_COMMUNITY): Payer: Self-pay | Admitting: Cardiology

## 2017-07-08 ENCOUNTER — Ambulatory Visit (HOSPITAL_COMMUNITY)
Admission: RE | Admit: 2017-07-08 | Discharge: 2017-07-08 | Disposition: A | Discharge status: Home / Self Care | Payer: PPO | Source: Ambulatory Visit | Attending: Cardiology | Admitting: Cardiology

## 2017-07-08 VITALS — BP 106/52 | HR 71 | Wt 213.5 lb

## 2017-07-08 DIAGNOSIS — Z7951 Long term (current) use of inhaled steroids: Secondary | ICD-10-CM | POA: Diagnosis not present

## 2017-07-08 DIAGNOSIS — I110 Hypertensive heart disease with heart failure: Secondary | ICD-10-CM | POA: Insufficient documentation

## 2017-07-08 DIAGNOSIS — I429 Cardiomyopathy, unspecified: Secondary | ICD-10-CM | POA: Insufficient documentation

## 2017-07-08 DIAGNOSIS — Z8249 Family history of ischemic heart disease and other diseases of the circulatory system: Secondary | ICD-10-CM | POA: Insufficient documentation

## 2017-07-08 DIAGNOSIS — Z7984 Long term (current) use of oral hypoglycemic drugs: Secondary | ICD-10-CM | POA: Insufficient documentation

## 2017-07-08 DIAGNOSIS — R112 Nausea with vomiting, unspecified: Secondary | ICD-10-CM

## 2017-07-08 DIAGNOSIS — I5022 Chronic systolic (congestive) heart failure: Secondary | ICD-10-CM | POA: Insufficient documentation

## 2017-07-08 DIAGNOSIS — Z79899 Other long term (current) drug therapy: Secondary | ICD-10-CM | POA: Diagnosis not present

## 2017-07-08 DIAGNOSIS — E119 Type 2 diabetes mellitus without complications: Secondary | ICD-10-CM | POA: Insufficient documentation

## 2017-07-08 DIAGNOSIS — M069 Rheumatoid arthritis, unspecified: Secondary | ICD-10-CM | POA: Diagnosis not present

## 2017-07-08 LAB — COMPREHENSIVE METABOLIC PANEL
ALK PHOS: 99 U/L (ref 38–126)
ALT: 12 U/L — ABNORMAL LOW (ref 14–54)
ANION GAP: 10 (ref 5–15)
AST: 18 U/L (ref 15–41)
Albumin: 3.9 g/dL (ref 3.5–5.0)
BILIRUBIN TOTAL: 1.1 mg/dL (ref 0.3–1.2)
BUN: 15 mg/dL (ref 6–20)
CALCIUM: 8.7 mg/dL — AB (ref 8.9–10.3)
CO2: 24 mmol/L (ref 22–32)
Chloride: 103 mmol/L (ref 101–111)
Creatinine, Ser: 1 mg/dL (ref 0.44–1.00)
Glucose, Bld: 112 mg/dL — ABNORMAL HIGH (ref 65–99)
POTASSIUM: 3.9 mmol/L (ref 3.5–5.1)
Sodium: 137 mmol/L (ref 135–145)
TOTAL PROTEIN: 8.8 g/dL — AB (ref 6.5–8.1)

## 2017-07-08 LAB — TSH: TSH: 2.009 u[IU]/mL (ref 0.350–4.500)

## 2017-07-08 NOTE — Patient Instructions (Signed)
While experiencing vomiting decrease Furosemide 120 mg daily  Labs drawn today (if we do not call you, then your lab work was stable)   [facility] appt made  GI will call to schedule   Your physician recommends that you schedule a follow-up appointment in: 3 weeks with NP

## 2017-07-10 NOTE — Progress Notes (Signed)
PCP: Dr. Hyman Hopes Cardiology: Dr. Clifton James HF Cardiology: Dr. Shirlee Latch  47 y.o. with history of nonischemic cardiomyopathy/chronic systolic CHF and rheumatoid arthritis was referred by Dr. Clifton James for evaluation/management of CHF.    Patient has had a cardiomyopathy known for a number of years now.  Coronary angiography in 2012 showed no significant disease.  EF has been in the 25-30% range long-term.  Most recent echo in 8/18 showed EF remains 25-30%.  She has no family history of cardiomyopathy and she has never been a heavy drinker.  She has not used illicit drugs.  RHC in 10/18 showed preserved cardiac output with mildly increased filling pressures.  Lasix was increased to 120 qam/80 qpm.  She was seen by EP (Camnitz) and wanted to think about ICD.   She returns for followup of CHF.  For the last couple of weeks, she has had significant nausea and vomiting.  This has occurred daily.  It has been hard for her to keep down her meds though she is trying.  She has cut back on Lasix, taking it at most once a day.  Weight is down 7 lbs.  No diarrhea, no abdominal pain.  She has no dyspnea walking on flat ground.  She is tired walking up stairs.  No lightheadedness.     Labs (9/18): K 4.4, creatinine 0.81 Labs (10/18): K 4, creatinine 0.88 Labs (1/19): K 3.9, creatinine 0.66  PMH: 1. Rheumatoid arthritis: Followed by Dr. Lendon Colonel.  2. Type II diabetes.  3. HTN 4. Chronic systolic CHF: Nonischemic cardiomyopathy.  - LHC (2012): No significant CAD.  - Echo (7/13): EF 30-35% - Cardiac MRI (12/14): Mild LV dilation, EF 40%, normal RV size and systolic function, no late gadolinium enhancement.  - Echo (7/16): EF 25-30% - Echo (8/18): EF 25-30% - RHC (10/18): mean RA 11, PA 47/20 mean 32, mean PCWP 16, CI 3.06 Fick/3.5 thermo, PVR 2.65 WU.   SH: Lives with mother.  Rare ETOH, no drugs.  No smoking.   Family History  Problem Relation Age of Onset  . Heart attack Mother   . CAD Mother   . Allergies  Mother   . Breast cancer Mother   . Hypertension Unknown   . Diabetes Mellitus II Unknown    ROS: All systems reviewed and negative except as per HPI.   Current Outpatient Medications  Medication Sig Dispense Refill  . albuterol (PROAIR HFA) 108 (90 BASE) MCG/ACT inhaler Inhale 2 puffs into the lungs every 6 (six) hours as needed for wheezing or shortness of breath.    Marland Kitchen albuterol (PROVENTIL) (2.5 MG/3ML) 0.083% nebulizer solution Take 2.5 mg by nebulization every 6 (six) hours as needed for wheezing or shortness of breath.    . ferrous gluconate (FERGON) 324 MG tablet Take 324 mg by mouth daily.    . fluticasone (FLONASE) 50 MCG/ACT nasal spray Place 2 sprays into both nostrils daily as needed (for allergies.).     Marland Kitchen folic acid (FOLVITE) 1 MG tablet Take 1 mg by mouth daily.    . furosemide (LASIX) 40 MG tablet Take 3 tablets (120mg ) in the mornings and 2 tablets (80mg ) in the evenings by mouth daily 450 tablet 3  . hydroxychloroquine (PLAQUENIL) 200 MG tablet Take 400 mg by mouth daily.     . isosorbide-hydrALAZINE (BIDIL) 20-37.5 MG tablet Take 2 tablets by mouth 3 (three) times daily. 540 tablet 3  . levocetirizine (XYZAL) 5 MG tablet Take 5 mg by mouth at bedtime.      metFORMIN (GLUCOPHAGE) 500 MG tablet Take 1,000 mg by mouth 2 (two) times daily with a meal.     . methocarbamol (ROBAXIN) 500 MG tablet Take 500 mg by mouth 2 (two) times daily as needed (for muscle spasms.).     Marland Kitchen Methotrexate, PF, 25 MG/0.5ML SOAJ Inject 0.7 mLs into the skin once a week. On Friday    . metoprolol succinate (TOPROL-XL) 50 MG 24 hr tablet Take 1 tablet (50 mg total) by mouth daily. 90 tablet 3  . omeprazole (PRILOSEC) 40 MG capsule Take 40 mg by mouth daily before breakfast.     . potassium chloride SA (KLOR-CON M20) 20 MEQ tablet Take 2 tablets (40 mEq total) by mouth daily. 180 tablet 3  . sacubitril-valsartan (ENTRESTO) 97-103 MG Take 1 tablet by mouth 2 (two) times daily. 180 tablet 3  .  spironolactone (ALDACTONE) 25 MG tablet Take 1 tablet (25 mg total) by mouth daily. 90 tablet 3  . SYMBICORT 160-4.5 MCG/ACT inhaler Inhale 2 puffs into the lungs daily as needed (for respiratory issues.).     Marland Kitchen traMADol (ULTRAM) 50 MG tablet Take 1 tablet (50 mg total) by mouth 4 (four) times daily. pain (Patient taking differently: Take 50 mg by mouth 4 (four) times daily as needed (for pain.). ) 60 tablet 1  . triamcinolone cream (KENALOG) 0.1 % Apply 1 application topically 2 (two) times daily as needed (for ezcema.).    Marland Kitchen valACYclovir (VALTREX) 500 MG tablet Take 500 mg by mouth 2 (two) times daily as needed (for cold sores.).     No current facility-administered medications for this encounter.    BP (!) 106/52   Pulse 71   Wt 213 lb 8 oz (96.8 kg)   SpO2 100%   BMI 40.34 kg/m  General: NAD, obese Neck: No JVD, no thyromegaly or thyroid nodule.  Lungs: Clear to auscultation bilaterally with normal respiratory effort. CV: Nondisplaced PMI.  Heart regular S1/S2, no S3/S4, no murmur.  No peripheral edema.  No carotid bruit.  Normal pedal pulses.  Abdomen: Soft, nontender, no hepatosplenomegaly, no distention.  Skin: Intact without lesions or rashes.  Neurologic: Alert and oriented x 3.  Psych: Normal affect. Extremities: No clubbing or cyanosis.  HEENT: Normal.   Assessment/Plan: 1. Chronic systolic CHF: Nonischemic cardiomyopathy.  Etiology uncertain, possible prior viral myocarditis.  CMRI in 12/14 with no LGE noted.  No heavy drinking or drugs.  No history of familial cardiomyopathy.  NYHA class II symptoms.  She is not volume overloaded and weight is down in the setting of chronic nausea/vomiting.  She is only taking Lasix once a day.  - I agree with her only taking Lasix 120 mg qam and dropping the pm dose with her GI symptoms.  Will check BMET today, may need to back off further if creatinine is up.    - Continue current spironolactone.  - Continue Toprol XL 50 mg daily.  -  Continue Bidil 2 tabs tid.   - Continue Entresto 97/103 bid.  - Even though she has a nonischemic cardiomyopathy, given her young age and persistently low EF, I think that ICD placement would be reasonable. Would like Development worker, community. She saw Dr Elberta Fortis and now tells me that she is willing to have ICD placed.  I am doing to send her back to EP to schedule ICD.  Narrow QRS, not candidate for CRT.  2. Rheumatoid arthritis: She sees a rheumatologist.  3. Chronic nausea/vomiting: This has been  going on for several weeks.  No diarrhea or abdominal pain/tenderness.  I think it is time for her to see GI, I will make referral today.  - Check LFTs, TSH.   Followup with NP/PA in 3 wks.    Marca Ancona 07/10/2017

## 2017-07-20 ENCOUNTER — Encounter: Payer: Self-pay | Admitting: Cardiology

## 2017-07-25 ENCOUNTER — Ambulatory Visit: Payer: PPO | Admitting: Cardiology

## 2017-08-01 ENCOUNTER — Encounter (HOSPITAL_COMMUNITY): Payer: PPO

## 2017-08-03 DIAGNOSIS — E119 Type 2 diabetes mellitus without complications: Secondary | ICD-10-CM | POA: Diagnosis not present

## 2017-08-03 DIAGNOSIS — I10 Essential (primary) hypertension: Secondary | ICD-10-CM | POA: Diagnosis not present

## 2017-08-03 DIAGNOSIS — Z0000 Encounter for general adult medical examination without abnormal findings: Secondary | ICD-10-CM | POA: Diagnosis not present

## 2017-08-03 DIAGNOSIS — Z136 Encounter for screening for cardiovascular disorders: Secondary | ICD-10-CM | POA: Diagnosis not present

## 2017-08-19 DIAGNOSIS — M0579 Rheumatoid arthritis with rheumatoid factor of multiple sites without organ or systems involvement: Secondary | ICD-10-CM | POA: Diagnosis not present

## 2017-08-19 DIAGNOSIS — Z79899 Other long term (current) drug therapy: Secondary | ICD-10-CM | POA: Diagnosis not present

## 2017-08-31 ENCOUNTER — Ambulatory Visit: Payer: PPO | Admitting: Cardiology

## 2017-08-31 NOTE — Progress Notes (Signed)
Advanced Heart Failure Clinic Note  PCP: Dr. Hyman Hopes Cardiology: Dr. Clifton James HF Cardiology: Dr. [name] is a 47 y.o. female with history of nonischemic cardiomyopathy/chronic systolic CHF and rheumatoid arthritis was referred by Dr. Clifton James for evaluation/management of CHF.    Patient has had a cardiomyopathy known for a number of years now.  Coronary angiography in 2012 showed no significant disease.  EF has been in the 25-30% range long-term.  Most recent echo in 8/18 showed EF remains 25-30%.  She has no family history of cardiomyopathy and she has never been a heavy drinker.  She has not used illicit drugs.  RHC in 10/18 showed preserved cardiac output with mildly increased filling pressures.  Lasix was increased to 120 qam/80 qpm.  She was seen by EP (Camnitz) and wanted to think about ICD.   She returns today for HF follow up. Last visit, lasix was decreased with nausea and vomiting. She was referred to GI for ongoing nausea. She was also referred back to EP for ICD consideration. She did not keep appointment with GI because she had resolution of nausea/vomiting. She does not want an ICD at this point so has not followed back up with Dr Elberta Fortis. Overall, doing really well. She is back on her original lasix dose. Denies SOB with grocery shopping. Does not do stairs. Denies edema and PND. Sleeps on 3 pillows at baseline. Denies CP or dizziness. Taking all medications. Is not weighing at home. Limiting fluid <2 L. Tries to limit salt intake - cooking with low salt foods.   Labs (9/18): K 4.4, creatinine 0.81 Labs (10/18): K 4, creatinine 0.88 Labs (1/19): K 3.9, creatinine 0.66  PMH: 1. Rheumatoid arthritis: Followed by Dr. Lendon Colonel.  2. Type II diabetes.  3. HTN 4. Chronic systolic CHF: Nonischemic cardiomyopathy.  - LHC (2012): No significant CAD.  - Echo (7/13): EF 30-35% - Cardiac MRI (12/14): Mild LV dilation, EF 40%, normal RV size and systolic function, no late  gadolinium enhancement.  - Echo (7/16): EF 25-30% - Echo (8/18): EF 25-30% - RHC (10/18): mean RA 11, PA 47/20 mean 32, mean PCWP 16, CI 3.06 Fick/3.5 thermo, PVR 2.65 WU.   SH: Lives with mother.  Rare ETOH, no drugs.  No smoking.   Review of systems complete and found to be negative unless listed in HPI.   Family History  Problem Relation Age of Onset  . Heart attack Mother   . CAD Mother   . Allergies Mother   . Breast cancer Mother   . Hypertension Unknown   . Diabetes Mellitus II Unknown     Current Outpatient Medications  Medication Sig Dispense Refill  . albuterol (PROAIR HFA) 108 (90 BASE) MCG/ACT inhaler Inhale 2 puffs into the lungs every 6 (six) hours as needed for wheezing or shortness of breath.    Marland Kitchen albuterol (PROVENTIL) (2.5 MG/3ML) 0.083% nebulizer solution Take 2.5 mg by nebulization every 6 (six) hours as needed for wheezing or shortness of breath.    . ferrous gluconate (FERGON) 324 MG tablet Take 324 mg by mouth daily.    . fluticasone (FLONASE) 50 MCG/ACT nasal spray Place 2 sprays into both nostrils daily as needed (for allergies.).     Marland Kitchen folic acid (FOLVITE) 1 MG tablet Take 1 mg by mouth daily.    . furosemide (LASIX) 40 MG tablet Take 3 tablets (120mg ) in the mornings and 2 tablets (80mg ) in the evenings by mouth daily 450 tablet 3  .  hydroxychloroquine (PLAQUENIL) 200 MG tablet Take 400 mg by mouth daily.     . isosorbide-hydrALAZINE (BIDIL) 20-37.5 MG tablet Take 2 tablets by mouth 3 (three) times daily. 540 tablet 3  . levocetirizine (XYZAL) 5 MG tablet Take 5 mg by mouth at bedtime.     . metFORMIN (GLUCOPHAGE) 500 MG tablet Take 1,000 mg by mouth 2 (two) times daily with a meal.     . methocarbamol (ROBAXIN) 500 MG tablet Take 500 mg by mouth 2 (two) times daily as needed (for muscle spasms.).     Marland Kitchen Methotrexate, PF, 25 MG/0.5ML SOAJ Inject 0.7 mLs into the skin once a week. On Friday    . metoprolol succinate (TOPROL-XL) 50 MG 24 hr tablet Take 1 tablet  (50 mg total) by mouth daily. 90 tablet 3  . omeprazole (PRILOSEC) 40 MG capsule Take 40 mg by mouth daily before breakfast.     . potassium chloride SA (KLOR-CON M20) 20 MEQ tablet Take 2 tablets (40 mEq total) by mouth daily. 180 tablet 3  . sacubitril-valsartan (ENTRESTO) 97-103 MG Take 1 tablet by mouth 2 (two) times daily. 180 tablet 3  . SYMBICORT 160-4.5 MCG/ACT inhaler Inhale 2 puffs into the lungs daily as needed (for respiratory issues.).     Marland Kitchen traMADol (ULTRAM) 50 MG tablet Take 1 tablet (50 mg total) by mouth 4 (four) times daily. pain (Patient taking differently: Take 50 mg by mouth 4 (four) times daily as needed (for pain.). ) 60 tablet 1  . triamcinolone cream (KENALOG) 0.1 % Apply 1 application topically 2 (two) times daily as needed (for ezcema.).    Marland Kitchen valACYclovir (VALTREX) 500 MG tablet Take 500 mg by mouth 2 (two) times daily as needed (for cold sores.).    Marland Kitchen spironolactone (ALDACTONE) 25 MG tablet Take 1 tablet (25 mg total) by mouth daily. 90 tablet 3   No current facility-administered medications for this encounter.    BP 110/72   Pulse 74   Wt 213 lb 6.4 oz (96.8 kg)   SpO2 100%   BMI 40.32 kg/m   Wt Readings from Last 3 Encounters:  09/01/17 213 lb 6.4 oz (96.8 kg)  07/08/17 213 lb 8 oz (96.8 kg)  05/20/17 220 lb (99.8 kg)    General: Well appearing. No resp difficulty. HEENT: Normal Neck: Supple. JVP 6-7. Carotids 2+ bilat; no bruits. No thyromegaly or nodule noted. Cor: PMI nondisplaced. RRR, No M/G/R noted Lungs: CTAB, normal effort. Abdomen: Soft, non-tender, non-distended, no HSM. No bruits or masses. +BS  Extremities: No cyanosis, clubbing, or rash. R and LLE no edema.  Neuro: Alert & orientedx3, cranial nerves grossly intact. moves all 4 extremities w/o difficulty. Affect pleasant  Assessment/Plan:  1. Chronic systolic CHF: Nonischemic cardiomyopathy.  Etiology uncertain, possible prior viral myocarditis.  CMRI in 12/14 with no LGE noted.  No heavy  drinking or drugs.  No history of familial cardiomyopathy.  Echo 11/2016: EF 25-30% - NYHA II - Volume status stable on exam - Continue Lasix 120 mg am, 80 mg pm. Labs in Care Everywhere reviewed: K 3.8, creatinine 0.82 on 08/19/17 - Continue Spironolactone 25 mg daily.  - Continue Toprol XL 50 mg daily.  - Continue Bidil 2 tabs tid.   - Continue Entresto 97/103 bid.  - Referred for ICD last visit. 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 Not a CRT candidate. She does not feel ready for ICD at this point. She is aware that she is at increased risk of sudden cardiac  death. - Encouraged her to get a scale and start weighing daily  - Follow up in 3 months with Echo with Dr Shirlee Latch  2. Rheumatoid arthritis: She sees a rheumatologist.   3. Chronic nausea/vomiting:  - Resolved. Did not see GI.  - TSH and LFTs normal 3/29  Follow up in 3 months with Echo with Dr Herbie Saxon Dwan Bolt 09/01/2017  Greater than 50% of the 25 minute visit was spent in counseling/coordination of care regarding disease state education, salt/fluid restriction, sliding scale diuretics, and medication compliance.

## 2017-09-01 ENCOUNTER — Ambulatory Visit (HOSPITAL_COMMUNITY)
Admission: RE | Admit: 2017-09-01 | Discharge: 2017-09-01 | Disposition: A | Discharge status: Home / Self Care | Payer: PPO | Source: Ambulatory Visit | Attending: Cardiology | Admitting: Cardiology

## 2017-09-01 ENCOUNTER — Encounter (HOSPITAL_COMMUNITY): Payer: Self-pay

## 2017-09-01 VITALS — BP 110/72 | HR 74 | Wt 213.4 lb

## 2017-09-01 DIAGNOSIS — Z79899 Other long term (current) drug therapy: Secondary | ICD-10-CM | POA: Insufficient documentation

## 2017-09-01 DIAGNOSIS — I110 Hypertensive heart disease with heart failure: Secondary | ICD-10-CM | POA: Insufficient documentation

## 2017-09-01 DIAGNOSIS — E119 Type 2 diabetes mellitus without complications: Secondary | ICD-10-CM | POA: Diagnosis not present

## 2017-09-01 DIAGNOSIS — I5022 Chronic systolic (congestive) heart failure: Secondary | ICD-10-CM | POA: Insufficient documentation

## 2017-09-01 DIAGNOSIS — R112 Nausea with vomiting, unspecified: Secondary | ICD-10-CM | POA: Diagnosis not present

## 2017-09-01 DIAGNOSIS — Z7951 Long term (current) use of inhaled steroids: Secondary | ICD-10-CM | POA: Diagnosis not present

## 2017-09-01 DIAGNOSIS — Z8249 Family history of ischemic heart disease and other diseases of the circulatory system: Secondary | ICD-10-CM | POA: Diagnosis not present

## 2017-09-01 DIAGNOSIS — I428 Other cardiomyopathies: Secondary | ICD-10-CM | POA: Insufficient documentation

## 2017-09-01 DIAGNOSIS — M069 Rheumatoid arthritis, unspecified: Secondary | ICD-10-CM | POA: Insufficient documentation

## 2017-09-01 DIAGNOSIS — Z7984 Long term (current) use of oral hypoglycemic drugs: Secondary | ICD-10-CM | POA: Insufficient documentation

## 2017-09-01 NOTE — Patient Instructions (Signed)
No changes to medication at this time.  Follow up 3 months with Dr. Shirlee Latch and echocardiogram.  _____________________________________________________________ [place] Code: 1500  Take all medication as prescribed the day of your appointment. Bring all medications with you to your appointment.  Do the following things EVERYDAY: 1) Weigh yourself in the morning before breakfast. Write it down and keep it in a log. 2) Take your medicines as prescribed 3) Eat low salt foods-Limit salt (sodium) to 2000 mg per day.  4) Stay as active as you can everyday 5) Limit all fluids for the day to less than 2 liters

## 2017-09-07 ENCOUNTER — Encounter: Payer: Self-pay | Admitting: Cardiology

## 2017-09-19 DIAGNOSIS — L304 Erythema intertrigo: Secondary | ICD-10-CM | POA: Diagnosis not present

## 2017-11-03 DIAGNOSIS — L280 Lichen simplex chronicus: Secondary | ICD-10-CM | POA: Diagnosis not present

## 2017-11-03 DIAGNOSIS — L304 Erythema intertrigo: Secondary | ICD-10-CM | POA: Diagnosis not present

## 2017-11-10 ENCOUNTER — Other Ambulatory Visit (HOSPITAL_COMMUNITY): Payer: Self-pay | Admitting: *Deleted

## 2017-11-10 DIAGNOSIS — I5022 Chronic systolic (congestive) heart failure: Secondary | ICD-10-CM

## 2017-11-21 DIAGNOSIS — M0600 Rheumatoid arthritis without rheumatoid factor, unspecified site: Secondary | ICD-10-CM | POA: Diagnosis not present

## 2017-11-21 DIAGNOSIS — Z79899 Other long term (current) drug therapy: Secondary | ICD-10-CM | POA: Diagnosis not present

## 2017-12-05 ENCOUNTER — Encounter (HOSPITAL_COMMUNITY): Payer: PPO | Admitting: Cardiology

## 2017-12-05 ENCOUNTER — Ambulatory Visit (HOSPITAL_COMMUNITY): Admission: RE | Admit: 2017-12-05 | Payer: PPO | Source: Ambulatory Visit

## 2017-12-19 DIAGNOSIS — M545 Low back pain: Secondary | ICD-10-CM | POA: Diagnosis not present

## 2017-12-19 DIAGNOSIS — Z23 Encounter for immunization: Secondary | ICD-10-CM | POA: Diagnosis not present

## 2017-12-19 DIAGNOSIS — T148XXA Other injury of unspecified body region, initial encounter: Secondary | ICD-10-CM | POA: Diagnosis not present

## 2017-12-19 DIAGNOSIS — W19XXXA Unspecified fall, initial encounter: Secondary | ICD-10-CM | POA: Diagnosis not present

## 2017-12-27 ENCOUNTER — Other Ambulatory Visit: Payer: Self-pay | Admitting: Cardiovascular Disease

## 2017-12-28 NOTE — Telephone Encounter (Signed)
This is a CHF pt. Please address 

## 2017-12-31 ENCOUNTER — Other Ambulatory Visit: Payer: Self-pay | Admitting: Cardiovascular Disease

## 2018-01-02 NOTE — Telephone Encounter (Signed)
This is a CHF pt 

## 2018-01-03 ENCOUNTER — Other Ambulatory Visit: Payer: Self-pay | Admitting: Cardiovascular Disease

## 2018-01-04 ENCOUNTER — Other Ambulatory Visit (HOSPITAL_COMMUNITY): Payer: Self-pay

## 2018-01-04 MED ORDER — POTASSIUM CHLORIDE CRYS ER 20 MEQ PO TBCR: 40.0000 meq | EXTENDED_RELEASE_TABLET | Freq: Every day | ORAL | 1 refills | Status: DC

## 2018-01-04 NOTE — Telephone Encounter (Signed)
This is a CHF pt 

## 2018-01-06 DIAGNOSIS — Q828 Other specified congenital malformations of skin: Secondary | ICD-10-CM | POA: Diagnosis not present

## 2018-01-06 DIAGNOSIS — B079 Viral wart, unspecified: Secondary | ICD-10-CM | POA: Diagnosis not present

## 2018-01-06 DIAGNOSIS — L300 Nummular dermatitis: Secondary | ICD-10-CM | POA: Diagnosis not present

## 2018-01-06 DIAGNOSIS — R51 Headache: Secondary | ICD-10-CM | POA: Diagnosis not present

## 2018-01-06 DIAGNOSIS — J0100 Acute maxillary sinusitis, unspecified: Secondary | ICD-10-CM | POA: Diagnosis not present

## 2018-01-17 DIAGNOSIS — J0110 Acute frontal sinusitis, unspecified: Secondary | ICD-10-CM | POA: Diagnosis not present

## 2018-01-17 DIAGNOSIS — E876 Hypokalemia: Secondary | ICD-10-CM | POA: Diagnosis not present

## 2018-01-26 ENCOUNTER — Ambulatory Visit (HOSPITAL_BASED_OUTPATIENT_CLINIC_OR_DEPARTMENT_OTHER)
Admission: RE | Admit: 2018-01-26 | Discharge: 2018-01-26 | Disposition: A | Discharge status: Home / Self Care | Payer: PPO | Source: Ambulatory Visit | Attending: Cardiology | Admitting: Cardiology

## 2018-01-26 ENCOUNTER — Encounter (HOSPITAL_COMMUNITY): Payer: Self-pay | Admitting: Cardiology

## 2018-01-26 ENCOUNTER — Other Ambulatory Visit: Payer: Self-pay

## 2018-01-26 ENCOUNTER — Ambulatory Visit (HOSPITAL_COMMUNITY)
Admission: RE | Admit: 2018-01-26 | Discharge: 2018-01-26 | Disposition: A | Discharge status: Home / Self Care | Payer: PPO | Source: Ambulatory Visit | Attending: Internal Medicine | Admitting: Internal Medicine

## 2018-01-26 VITALS — BP 125/68 | HR 83 | Wt 227.0 lb

## 2018-01-26 DIAGNOSIS — M069 Rheumatoid arthritis, unspecified: Secondary | ICD-10-CM | POA: Diagnosis not present

## 2018-01-26 DIAGNOSIS — I428 Other cardiomyopathies: Secondary | ICD-10-CM | POA: Diagnosis not present

## 2018-01-26 DIAGNOSIS — Z79899 Other long term (current) drug therapy: Secondary | ICD-10-CM | POA: Diagnosis not present

## 2018-01-26 DIAGNOSIS — I5022 Chronic systolic (congestive) heart failure: Secondary | ICD-10-CM | POA: Diagnosis not present

## 2018-01-26 DIAGNOSIS — I110 Hypertensive heart disease with heart failure: Secondary | ICD-10-CM | POA: Diagnosis not present

## 2018-01-26 DIAGNOSIS — Z7984 Long term (current) use of oral hypoglycemic drugs: Secondary | ICD-10-CM | POA: Insufficient documentation

## 2018-01-26 DIAGNOSIS — Z8249 Family history of ischemic heart disease and other diseases of the circulatory system: Secondary | ICD-10-CM | POA: Diagnosis not present

## 2018-01-26 DIAGNOSIS — E119 Type 2 diabetes mellitus without complications: Secondary | ICD-10-CM | POA: Diagnosis not present

## 2018-01-26 LAB — BASIC METABOLIC PANEL
Anion gap: 9 (ref 5–15)
BUN: 29 mg/dL — AB (ref 6–20)
CALCIUM: 9 mg/dL (ref 8.9–10.3)
CHLORIDE: 102 mmol/L (ref 98–111)
CO2: 27 mmol/L (ref 22–32)
CREATININE: 0.82 mg/dL (ref 0.44–1.00)
GFR calc non Af Amer: >60 mL/min (ref >60)
Glucose, Bld: 122 mg/dL — ABNORMAL HIGH (ref 70–99)
Potassium: 4.5 mmol/L (ref 3.5–5.1)
SODIUM: 138 mmol/L (ref 135–145)

## 2018-01-26 MED ORDER — METOPROLOL SUCCINATE ER 50 MG PO TB24: ORAL_TABLET | ORAL | 3 refills | Status: DC

## 2018-01-26 NOTE — Patient Instructions (Signed)
Increase Metoprolol to 75 mg (1 & 1/2 tabs) daily  Labs done today  Your physician recommends that you schedule a follow-up appointment in: 4 months

## 2018-01-26 NOTE — Progress Notes (Signed)
PCP: Dr. Hyman Hopes Cardiology: Dr. Clifton James HF Cardiology: Dr. Shirlee Latch  47 y.o. with history of nonischemic cardiomyopathy/chronic systolic CHF and rheumatoid arthritis was referred by Dr. Clifton James for evaluation/management of CHF.    Patient has had a cardiomyopathy known for a number of years now.  Coronary angiography in 2012 showed no significant disease.  EF has been in the 25-30% range long-term.  Most recent echo in 8/18 showed EF remains 25-30%.  She has no family history of cardiomyopathy and she has never been a heavy drinker.  She has not used illicit drugs.  RHC in 10/18 showed preserved cardiac output with mildly increased filling pressures.  Lasix was increased to 120 qam/80 qpm.  She was seen by EP (Camnitz) and decided against ICD.    Echo was done today and reviewed, EF 40-45% with diffuse hypokinesis, normal RV.   She returns for followup of CHF.  Weight is up today but had dropped a lot in the setting of nausea/vomiting/diarrhea at the time of last appt.  She is doing well symptomatically.  No dyspnea walking up stairs or on flat ground.  No orthopnea/PND.  No lightheadedness.  She tries to walk some for exercise.  She has not been taking her potassium but potassium level was good on last BMET.   Labs (9/18): K 4.4, creatinine 0.81 Labs (10/18): K 4, creatinine 0.88 Labs (1/19): K 3.9, creatinine 0.66 Labs (10/19): K 3.9, creatinine 0.67  PMH: 1. Rheumatoid arthritis: Followed by Dr. Lendon Colonel.  2. Type II diabetes.  3. HTN 4. Chronic systolic CHF: Nonischemic cardiomyopathy.  - LHC (2012): No significant CAD.  - Echo (7/13): EF 30-35% - Cardiac MRI (12/14): Mild LV dilation, EF 40%, normal RV size and systolic function, no late gadolinium enhancement.  - Echo (7/16): EF 25-30% - Echo (8/18): EF 25-30% - RHC (10/18): mean RA 11, PA 47/20 mean 32, mean PCWP 16, CI 3.06 Fick/3.5 thermo, PVR 2.65 WU.  - Echo (10/19): EF 40-45%, diffuse hypokinesis, moderate diastolic dysfunction,  normal RV size and systolic function  SH: Lives with mother.  Rare ETOH, no drugs.  No smoking.   Family History  Problem Relation Age of Onset  . Heart attack Mother   . CAD Mother   . Allergies Mother   . Breast cancer Mother   . Hypertension Unknown   . Diabetes Mellitus II Unknown    ROS: All systems reviewed and negative except as per HPI.   Current Outpatient Medications  Medication Sig Dispense Refill  . Adalimumab (HUMIRA) 20 MG/0.2ML PSKT Inject into the skin 2 (two) times a week.    Marland Kitchen albuterol (PROAIR HFA) 108 (90 BASE) MCG/ACT inhaler Inhale 2 puffs into the lungs every 6 (six) hours as needed for wheezing or shortness of breath.    Marland Kitchen albuterol (PROVENTIL) (2.5 MG/3ML) 0.083% nebulizer solution Take 2.5 mg by nebulization every 6 (six) hours as needed for wheezing or shortness of breath.    . ferrous gluconate (FERGON) 324 MG tablet Take 324 mg by mouth daily.    . fluticasone (FLONASE) 50 MCG/ACT nasal spray Place 2 sprays into both nostrils daily as needed (for allergies.).     Marland Kitchen folic acid (FOLVITE) 1 MG tablet Take 1 mg by mouth daily.    . furosemide (LASIX) 40 MG tablet Take 3 tablets (120mg ) in the mornings and 2 tablets (80mg ) in the evenings by mouth daily 450 tablet 3  . hydroxychloroquine (PLAQUENIL) 200 MG tablet Take 400 mg by mouth daily.      isosorbide-hydrALAZINE (BIDIL) 20-37.5 MG tablet Take 2 tablets by mouth 3 (three) times daily. 540 tablet 3  . levocetirizine (XYZAL) 5 MG tablet Take 5 mg by mouth at bedtime.     . metFORMIN (GLUCOPHAGE) 500 MG tablet Take 1,000 mg by mouth 2 (two) times daily with a meal.     . methocarbamol (ROBAXIN) 500 MG tablet Take 500 mg by mouth 2 (two) times daily as needed (for muscle spasms.).     Marland Kitchen Methotrexate, PF, 25 MG/0.5ML SOAJ Inject 0.7 mLs into the skin once a week. On Friday    . metoprolol succinate (TOPROL-XL) 50 MG 24 hr tablet Take 1.5 tabs daily 135 tablet 3  . omeprazole (PRILOSEC) 40 MG capsule Take 40 mg  by mouth daily before breakfast.     . sacubitril-valsartan (ENTRESTO) 97-103 MG Take 1 tablet by mouth 2 (two) times daily. 180 tablet 3  . spironolactone (ALDACTONE) 25 MG tablet Take 1 tablet (25 mg total) by mouth daily. 90 tablet 3  . SYMBICORT 160-4.5 MCG/ACT inhaler Inhale 2 puffs into the lungs daily as needed (for respiratory issues.).     Marland Kitchen traMADol (ULTRAM) 50 MG tablet Take 1 tablet (50 mg total) by mouth 4 (four) times daily. pain (Patient taking differently: Take 50 mg by mouth 4 (four) times daily as needed (for pain.). ) 60 tablet 1  . triamcinolone cream (KENALOG) 0.1 % Apply 1 application topically 2 (two) times daily as needed (for ezcema.).    Marland Kitchen valACYclovir (VALTREX) 500 MG tablet Take 500 mg by mouth 2 (two) times daily as needed (for cold sores.).    Marland Kitchen potassium chloride SA (KLOR-CON M20) 20 MEQ tablet Take 2 tablets (40 mEq total) by mouth daily. (Patient not taking: Reported on 01/26/2018) 180 tablet 1   No current facility-administered medications for this encounter.    BP 125/68   Pulse 83   Wt 103 kg (227 lb)   SpO2 100%   BMI 42.89 kg/m  General: NAD Neck: No JVD, no thyromegaly or thyroid nodule.  Lungs: Clear to auscultation bilaterally with normal respiratory effort. CV: Nondisplaced PMI.  Heart regular S1/S2, no S3/S4, no murmur.  No peripheral edema.  No carotid bruit.  Normal pedal pulses.  Abdomen: Soft, nontender, no hepatosplenomegaly, no distention.  Skin: Intact without lesions or rashes.  Neurologic: Alert and oriented x 3.  Psych: Normal affect. Extremities: No clubbing or cyanosis.  HEENT: Normal.   Assessment/Plan: 1. Chronic systolic CHF: Nonischemic cardiomyopathy.  Etiology uncertain, possible prior viral myocarditis.  CMRI in 12/14 with no LGE noted.  No heavy drinking or drugs.  No history of familial cardiomyopathy.  Echo was done today and reviewed, EF improved to 40-45%.  NYHA class II symptoms.  She does not appear volume overloaded on  exam.  - Continue Lasix 120 qam/80 qpm.  She has not been taking KCl.  Will check BMET today to make sure K is not low.  - Continue current spironolactone.  - Increase Toprol XL to 75 mg daily.  - Continue Bidil 2 tabs tid.   - Continue Entresto 97/103 bid.  - Echo today shows that EF is out of range for ICD.   2. Rheumatoid arthritis: She sees a rheumatologist.   Followup in 4 months.    [name] 01/26/2018

## 2018-01-26 NOTE — Progress Notes (Deleted)
  Echocardiogram 2D Echocardiogram has been performed.  Pieter Partridge 01/26/2018, 10:19 AM

## 2018-01-27 ENCOUNTER — Telehealth (HOSPITAL_COMMUNITY): Payer: Self-pay | Admitting: *Deleted

## 2018-01-27 NOTE — Telephone Encounter (Signed)
At OV 01/26/18 pt states she had been out of KCL for about a month and did not want to restart unless she had to.  Dr Shirlee Latch advised if labs were stable off of med she could remain off.  Called pt and advised her K was 4.5 which is good and stable, she can remain off KCL for now, she is agreeable and thankful for call

## 2018-01-31 DIAGNOSIS — J309 Allergic rhinitis, unspecified: Secondary | ICD-10-CM | POA: Diagnosis not present

## 2018-02-06 ENCOUNTER — Other Ambulatory Visit (HOSPITAL_COMMUNITY)
Admission: RE | Admit: 2018-02-06 | Discharge: 2018-02-06 | Disposition: A | Discharge status: Home / Self Care | Payer: PPO | Source: Ambulatory Visit | Attending: Obstetrics and Gynecology | Admitting: Obstetrics and Gynecology

## 2018-02-06 ENCOUNTER — Other Ambulatory Visit: Payer: Self-pay | Admitting: Obstetrics and Gynecology

## 2018-02-06 DIAGNOSIS — Z01411 Encounter for gynecological examination (general) (routine) with abnormal findings: Secondary | ICD-10-CM | POA: Insufficient documentation

## 2018-02-06 DIAGNOSIS — R8789 Other abnormal findings in specimens from female genital organs: Secondary | ICD-10-CM | POA: Diagnosis not present

## 2018-02-07 ENCOUNTER — Other Ambulatory Visit: Payer: Self-pay | Admitting: Obstetrics and Gynecology

## 2018-02-07 DIAGNOSIS — Z1231 Encounter for screening mammogram for malignant neoplasm of breast: Secondary | ICD-10-CM

## 2018-02-09 ENCOUNTER — Ambulatory Visit: Payer: PPO

## 2018-02-09 LAB — CYTOLOGY - PAP
Diagnosis: NEGATIVE
HPV (WINDOPATH): DETECTED — AB
HPV 16/18/45 genotyping: NEGATIVE

## 2018-03-03 DIAGNOSIS — L0390 Cellulitis, unspecified: Secondary | ICD-10-CM | POA: Diagnosis not present

## 2018-03-03 DIAGNOSIS — B372 Candidiasis of skin and nail: Secondary | ICD-10-CM | POA: Diagnosis not present

## 2018-03-08 DIAGNOSIS — B372 Candidiasis of skin and nail: Secondary | ICD-10-CM | POA: Diagnosis not present

## 2018-03-13 DIAGNOSIS — R300 Dysuria: Secondary | ICD-10-CM | POA: Diagnosis not present

## 2018-03-13 DIAGNOSIS — R109 Unspecified abdominal pain: Secondary | ICD-10-CM | POA: Diagnosis not present

## 2018-03-13 DIAGNOSIS — B372 Candidiasis of skin and nail: Secondary | ICD-10-CM | POA: Diagnosis not present

## 2018-03-14 ENCOUNTER — Other Ambulatory Visit: Payer: Self-pay

## 2018-03-14 ENCOUNTER — Inpatient Hospital Stay (HOSPITAL_COMMUNITY)
Admission: EM | Admit: 2018-03-14 | Discharge: 2018-03-28 | DRG: 392 | Disposition: A | Discharge status: Home / Self Care | Payer: PPO | Attending: Internal Medicine | Admitting: Internal Medicine

## 2018-03-14 ENCOUNTER — Emergency Department (HOSPITAL_COMMUNITY): Payer: PPO

## 2018-03-14 ENCOUNTER — Encounter (HOSPITAL_COMMUNITY): Payer: Self-pay | Admitting: Emergency Medicine

## 2018-03-14 DIAGNOSIS — Z887 Allergy status to serum and vaccine status: Secondary | ICD-10-CM

## 2018-03-14 DIAGNOSIS — I959 Hypotension, unspecified: Secondary | ICD-10-CM | POA: Diagnosis not present

## 2018-03-14 DIAGNOSIS — E119 Type 2 diabetes mellitus without complications: Secondary | ICD-10-CM | POA: Diagnosis not present

## 2018-03-14 DIAGNOSIS — D649 Anemia, unspecified: Secondary | ICD-10-CM

## 2018-03-14 DIAGNOSIS — Z79899 Other long term (current) drug therapy: Secondary | ICD-10-CM

## 2018-03-14 DIAGNOSIS — Z880 Allergy status to penicillin: Secondary | ICD-10-CM | POA: Diagnosis not present

## 2018-03-14 DIAGNOSIS — M62838 Other muscle spasm: Secondary | ICD-10-CM | POA: Diagnosis present

## 2018-03-14 DIAGNOSIS — E876 Hypokalemia: Secondary | ICD-10-CM | POA: Diagnosis not present

## 2018-03-14 DIAGNOSIS — N200 Calculus of kidney: Secondary | ICD-10-CM | POA: Diagnosis not present

## 2018-03-14 DIAGNOSIS — R0689 Other abnormalities of breathing: Secondary | ICD-10-CM | POA: Diagnosis not present

## 2018-03-14 DIAGNOSIS — M069 Rheumatoid arthritis, unspecified: Secondary | ICD-10-CM | POA: Diagnosis not present

## 2018-03-14 DIAGNOSIS — N179 Acute kidney failure, unspecified: Secondary | ICD-10-CM | POA: Diagnosis not present

## 2018-03-14 DIAGNOSIS — J45909 Unspecified asthma, uncomplicated: Secondary | ICD-10-CM | POA: Diagnosis present

## 2018-03-14 DIAGNOSIS — K651 Peritoneal abscess: Secondary | ICD-10-CM | POA: Diagnosis present

## 2018-03-14 DIAGNOSIS — Z79891 Long term (current) use of opiate analgesic: Secondary | ICD-10-CM

## 2018-03-14 DIAGNOSIS — K5730 Diverticulosis of large intestine without perforation or abscess without bleeding: Secondary | ICD-10-CM | POA: Diagnosis not present

## 2018-03-14 DIAGNOSIS — K5732 Diverticulitis of large intestine without perforation or abscess without bleeding: Secondary | ICD-10-CM

## 2018-03-14 DIAGNOSIS — Z7951 Long term (current) use of inhaled steroids: Secondary | ICD-10-CM

## 2018-03-14 DIAGNOSIS — K5720 Diverticulitis of large intestine with perforation and abscess without bleeding: Secondary | ICD-10-CM | POA: Diagnosis present

## 2018-03-14 DIAGNOSIS — B379 Candidiasis, unspecified: Secondary | ICD-10-CM | POA: Diagnosis present

## 2018-03-14 DIAGNOSIS — N390 Urinary tract infection, site not specified: Secondary | ICD-10-CM | POA: Diagnosis not present

## 2018-03-14 DIAGNOSIS — I110 Hypertensive heart disease with heart failure: Secondary | ICD-10-CM | POA: Diagnosis present

## 2018-03-14 DIAGNOSIS — Z6841 Body Mass Index (BMI) 40.0 and over, adult: Secondary | ICD-10-CM

## 2018-03-14 DIAGNOSIS — Z803 Family history of malignant neoplasm of breast: Secondary | ICD-10-CM

## 2018-03-14 DIAGNOSIS — R109 Unspecified abdominal pain: Secondary | ICD-10-CM | POA: Diagnosis not present

## 2018-03-14 DIAGNOSIS — Z7984 Long term (current) use of oral hypoglycemic drugs: Secondary | ICD-10-CM

## 2018-03-14 DIAGNOSIS — I428 Other cardiomyopathies: Secondary | ICD-10-CM | POA: Diagnosis not present

## 2018-03-14 DIAGNOSIS — I5022 Chronic systolic (congestive) heart failure: Secondary | ICD-10-CM | POA: Diagnosis present

## 2018-03-14 DIAGNOSIS — E860 Dehydration: Secondary | ICD-10-CM | POA: Diagnosis not present

## 2018-03-14 DIAGNOSIS — R300 Dysuria: Secondary | ICD-10-CM | POA: Diagnosis not present

## 2018-03-14 DIAGNOSIS — L304 Erythema intertrigo: Secondary | ICD-10-CM | POA: Diagnosis present

## 2018-03-14 DIAGNOSIS — Z888 Allergy status to other drugs, medicaments and biological substances status: Secondary | ICD-10-CM

## 2018-03-14 DIAGNOSIS — R509 Fever, unspecified: Secondary | ICD-10-CM | POA: Diagnosis not present

## 2018-03-14 DIAGNOSIS — K611 Rectal abscess: Secondary | ICD-10-CM | POA: Diagnosis present

## 2018-03-14 DIAGNOSIS — Z8249 Family history of ischemic heart disease and other diseases of the circulatory system: Secondary | ICD-10-CM

## 2018-03-14 DIAGNOSIS — K5780 Diverticulitis of intestine, part unspecified, with perforation and abscess without bleeding: Secondary | ICD-10-CM | POA: Diagnosis not present

## 2018-03-14 DIAGNOSIS — Z885 Allergy status to narcotic agent status: Secondary | ICD-10-CM

## 2018-03-14 LAB — COMPREHENSIVE METABOLIC PANEL
ALT: 17 U/L (ref 0–44)
AST: 17 U/L (ref 15–41)
Albumin: 3.5 g/dL (ref 3.5–5.0)
Alkaline Phosphatase: 76 U/L (ref 38–126)
Anion gap: 10 (ref 5–15)
BUN: 40 mg/dL — ABNORMAL HIGH (ref 6–20)
CO2: 20 mmol/L — ABNORMAL LOW (ref 22–32)
Calcium: 8.3 mg/dL — ABNORMAL LOW (ref 8.9–10.3)
Chloride: 105 mmol/L (ref 98–111)
Creatinine, Ser: 1.21 mg/dL — ABNORMAL HIGH (ref 0.44–1.00)
GFR calc Af Amer: >60 mL/min (ref >60)
GFR calc non Af Amer: 54 mL/min — ABNORMAL LOW (ref >60)
Glucose, Bld: 140 mg/dL — ABNORMAL HIGH (ref 70–99)
Potassium: 3.8 mmol/L (ref 3.5–5.1)
Sodium: 135 mmol/L (ref 135–145)
Total Bilirubin: 0.7 mg/dL (ref 0.3–1.2)
Total Protein: 8.2 g/dL — ABNORMAL HIGH (ref 6.5–8.1)

## 2018-03-14 LAB — CBC WITH DIFFERENTIAL/PLATELET
Abs Immature Granulocytes: 0.23 10*3/uL — ABNORMAL HIGH (ref 0.00–0.07)
Basophils Absolute: 0 10*3/uL (ref 0.0–0.1)
Basophils Relative: 0 %
Eosinophils Absolute: 0 10*3/uL (ref 0.0–0.5)
Eosinophils Relative: 0 %
HCT: 28.5 % — ABNORMAL LOW (ref 36.0–46.0)
Hemoglobin: 8.7 g/dL — ABNORMAL LOW (ref 12.0–15.0)
Immature Granulocytes: 1 %
Lymphocytes Relative: 8 %
Lymphs Abs: 1.7 10*3/uL (ref 0.7–4.0)
MCH: 29.7 pg (ref 26.0–34.0)
MCHC: 30.5 g/dL (ref 30.0–36.0)
MCV: 97.3 fL (ref 80.0–100.0)
Monocytes Absolute: 2.6 10*3/uL — ABNORMAL HIGH (ref 0.1–1.0)
Monocytes Relative: 12 %
Neutro Abs: 17.9 10*3/uL — ABNORMAL HIGH (ref 1.7–7.7)
Neutrophils Relative %: 79 %
Platelets: 316 10*3/uL (ref 150–400)
RBC: 2.93 MIL/uL — ABNORMAL LOW (ref 3.87–5.11)
RDW: 17 % — ABNORMAL HIGH (ref 11.5–15.5)
WBC: 22.5 10*3/uL — ABNORMAL HIGH (ref 4.0–10.5)
nRBC: 0 % (ref 0.0–0.2)

## 2018-03-14 LAB — PREGNANCY, URINE: Preg Test, Ur: NEGATIVE

## 2018-03-14 LAB — URINALYSIS, ROUTINE W REFLEX MICROSCOPIC
Bacteria, UA: NONE SEEN
Bilirubin Urine: NEGATIVE
Glucose, UA: NEGATIVE mg/dL
Hgb urine dipstick: NEGATIVE
Ketones, ur: NEGATIVE mg/dL
Leukocytes, UA: NEGATIVE
Nitrite: NEGATIVE
Protein, ur: 30 mg/dL — AB
Specific Gravity, Urine: 1.026 (ref 1.005–1.030)
pH: 5 (ref 5.0–8.0)

## 2018-03-14 LAB — GLUCOSE, CAPILLARY
Glucose-Capillary: 87 mg/dL (ref 70–99)
Glucose-Capillary: 108 mg/dL — ABNORMAL HIGH (ref 70–99)

## 2018-03-14 LAB — LIPASE, BLOOD: Lipase: 30 U/L (ref 11–51)

## 2018-03-14 MED ORDER — METRONIDAZOLE IN NACL 5-0.79 MG/ML-% IV SOLN: 500.0000 mg | Freq: Once | INTRAVENOUS | Status: DC

## 2018-03-14 MED ORDER — HYDROMORPHONE HCL 1 MG/ML IJ SOLN
0.5000 mg | Freq: Once | INTRAMUSCULAR | Status: AC
  Administered 2018-03-14: 0.5 mg via INTRAVENOUS
  Filled 2018-03-14: qty 1

## 2018-03-14 MED ORDER — FOLIC ACID 1 MG PO TABS
1.0000 mg | ORAL_TABLET | Freq: Every day | ORAL | Status: DC
  Administered 2018-03-15 – 2018-03-28 (×14): 1 mg via ORAL
  Filled 2018-03-14 (×14): qty 1

## 2018-03-14 MED ORDER — ACETAMINOPHEN 325 MG PO TABS
650.0000 mg | ORAL_TABLET | Freq: Four times a day (QID) | ORAL | Status: DC | PRN
  Administered 2018-03-14: 650 mg via ORAL
  Filled 2018-03-14: qty 2

## 2018-03-14 MED ORDER — INSULIN ASPART 100 UNIT/ML ~~LOC~~ SOLN
0.0000 [IU] | SUBCUTANEOUS | Status: DC
  Administered 2018-03-17: 2 [IU] via SUBCUTANEOUS
  Administered 2018-03-19: 1 [IU] via SUBCUTANEOUS
  Administered 2018-03-20: 2 [IU] via SUBCUTANEOUS
  Administered 2018-03-20 – 2018-03-21 (×3): 1 [IU] via SUBCUTANEOUS
  Administered 2018-03-21: 2 [IU] via SUBCUTANEOUS
  Administered 2018-03-22: 1 [IU] via SUBCUTANEOUS
  Administered 2018-03-22: 2 [IU] via SUBCUTANEOUS
  Administered 2018-03-22 (×2): 1 [IU] via SUBCUTANEOUS
  Administered 2018-03-23: 2 [IU] via SUBCUTANEOUS
  Administered 2018-03-23 – 2018-03-24 (×2): 1 [IU] via SUBCUTANEOUS
  Administered 2018-03-24 – 2018-03-26 (×3): 2 [IU] via SUBCUTANEOUS
  Administered 2018-03-27 (×2): 1 [IU] via SUBCUTANEOUS
  Administered 2018-03-27: 2 [IU] via SUBCUTANEOUS
  Administered 2018-03-27 – 2018-03-28 (×2): 1 [IU] via SUBCUTANEOUS
  Administered 2018-03-28: 2 [IU] via SUBCUTANEOUS

## 2018-03-14 MED ORDER — SODIUM CHLORIDE 0.9 % IV SOLN
250.0000 mL | INTRAVENOUS | Status: DC | PRN
  Administered 2018-03-18 – 2018-03-27 (×3): 250 mL via INTRAVENOUS

## 2018-03-14 MED ORDER — SODIUM CHLORIDE 0.9% FLUSH
3.0000 mL | INTRAVENOUS | Status: DC | PRN
  Administered 2018-03-18: 3 mL via INTRAVENOUS
  Filled 2018-03-14: qty 3

## 2018-03-14 MED ORDER — SODIUM CHLORIDE 0.9 % IV BOLUS
1000.0000 mL | Freq: Once | INTRAVENOUS | Status: AC
Start: 2018-03-14 — End: 2018-03-14
  Administered 2018-03-14: 1000 mL via INTRAVENOUS

## 2018-03-14 MED ORDER — SACUBITRIL-VALSARTAN 97-103 MG PO TABS: 1.0000 | ORAL_TABLET | Freq: Two times a day (BID) | ORAL | Status: DC

## 2018-03-14 MED ORDER — METOPROLOL SUCCINATE ER 50 MG PO TB24
50.0000 mg | ORAL_TABLET | Freq: Every day | ORAL | Status: DC
  Administered 2018-03-15 – 2018-03-28 (×14): 50 mg via ORAL
  Filled 2018-03-14 (×14): qty 1

## 2018-03-14 MED ORDER — FLUCONAZOLE 100MG IVPB
100.0000 mg | INTRAVENOUS | Status: DC
  Administered 2018-03-14 – 2018-03-16 (×3): 100 mg via INTRAVENOUS
  Filled 2018-03-14 (×5): qty 50

## 2018-03-14 MED ORDER — METRONIDAZOLE IN NACL 5-0.79 MG/ML-% IV SOLN
500.0000 mg | Freq: Three times a day (TID) | INTRAVENOUS | Status: DC
  Administered 2018-03-14 – 2018-03-27 (×39): 500 mg via INTRAVENOUS
  Filled 2018-03-14 (×39): qty 100

## 2018-03-14 MED ORDER — SODIUM CHLORIDE 0.9% FLUSH
3.0000 mL | Freq: Two times a day (BID) | INTRAVENOUS | Status: DC
  Administered 2018-03-15 – 2018-03-27 (×9): 3 mL via INTRAVENOUS

## 2018-03-14 MED ORDER — ALBUTEROL SULFATE (2.5 MG/3ML) 0.083% IN NEBU
2.5000 mg | INHALATION_SOLUTION | Freq: Four times a day (QID) | RESPIRATORY_TRACT | Status: DC | PRN
  Filled 2018-03-14: qty 3

## 2018-03-14 MED ORDER — METHOCARBAMOL 500 MG PO TABS
500.0000 mg | ORAL_TABLET | Freq: Two times a day (BID) | ORAL | Status: DC | PRN
  Administered 2018-03-18 – 2018-03-23 (×4): 500 mg via ORAL
  Filled 2018-03-14 (×5): qty 1

## 2018-03-14 MED ORDER — SODIUM CHLORIDE (PF) 0.9 % IJ SOLN
INTRAMUSCULAR | Status: AC
  Filled 2018-03-14: qty 50

## 2018-03-14 MED ORDER — CIPROFLOXACIN IN D5W 400 MG/200ML IV SOLN
400.0000 mg | Freq: Once | INTRAVENOUS | Status: AC
  Administered 2018-03-14: 400 mg via INTRAVENOUS
  Filled 2018-03-14: qty 200

## 2018-03-14 MED ORDER — IOPAMIDOL (ISOVUE-300) INJECTION 61%
INTRAVENOUS | Status: AC
  Filled 2018-03-14: qty 100

## 2018-03-14 MED ORDER — ACETAMINOPHEN 650 MG RE SUPP: 650.0000 mg | Freq: Four times a day (QID) | RECTAL | Status: DC | PRN

## 2018-03-14 MED ORDER — TRAMADOL HCL 50 MG PO TABS
50.0000 mg | ORAL_TABLET | Freq: Four times a day (QID) | ORAL | Status: DC | PRN
  Administered 2018-03-14 – 2018-03-20 (×15): 50 mg via ORAL
  Filled 2018-03-14 (×16): qty 1

## 2018-03-14 MED ORDER — MORPHINE SULFATE (PF) 2 MG/ML IV SOLN
2.0000 mg | INTRAVENOUS | Status: DC | PRN
  Administered 2018-03-14 – 2018-03-16 (×6): 2 mg via INTRAVENOUS
  Filled 2018-03-14 (×6): qty 1

## 2018-03-14 MED ORDER — CIPROFLOXACIN IN D5W 400 MG/200ML IV SOLN
400.0000 mg | Freq: Two times a day (BID) | INTRAVENOUS | Status: DC
  Administered 2018-03-15 – 2018-03-21 (×14): 400 mg via INTRAVENOUS
  Filled 2018-03-14 (×14): qty 200

## 2018-03-14 MED ORDER — CLOTRIMAZOLE 1 % EX CREA
1.0000 "application " | TOPICAL_CREAM | Freq: Two times a day (BID) | CUTANEOUS | Status: DC
  Administered 2018-03-14 – 2018-03-17 (×6): 1 via TOPICAL
  Filled 2018-03-14 (×2): qty 15

## 2018-03-14 MED ORDER — SODIUM CHLORIDE 0.9 % IV SOLN
INTRAVENOUS | Status: AC
  Administered 2018-03-14: 18:00:00 via INTRAVENOUS

## 2018-03-14 MED ORDER — MOMETASONE FURO-FORMOTEROL FUM 200-5 MCG/ACT IN AERO
2.0000 | INHALATION_SPRAY | Freq: Two times a day (BID) | RESPIRATORY_TRACT | Status: DC
  Administered 2018-03-23 – 2018-03-27 (×6): 2 via RESPIRATORY_TRACT
  Filled 2018-03-14 (×2): qty 8.8

## 2018-03-14 MED ORDER — IOPAMIDOL (ISOVUE-300) INJECTION 61%
100.0000 mL | Freq: Once | INTRAVENOUS | Status: AC | PRN
  Administered 2018-03-14: 100 mL via INTRAVENOUS

## 2018-03-14 MED ORDER — ISOSORB DINITRATE-HYDRALAZINE 20-37.5 MG PO TABS
2.0000 | ORAL_TABLET | Freq: Three times a day (TID) | ORAL | Status: DC
  Administered 2018-03-15 – 2018-03-28 (×40): 2 via ORAL
  Filled 2018-03-14 (×43): qty 2

## 2018-03-14 MED ORDER — PANTOPRAZOLE SODIUM 40 MG PO TBEC
40.0000 mg | DELAYED_RELEASE_TABLET | Freq: Every day | ORAL | Status: DC
  Administered 2018-03-15 – 2018-03-28 (×14): 40 mg via ORAL
  Filled 2018-03-14 (×14): qty 1

## 2018-03-14 MED ORDER — FUROSEMIDE 40 MG PO TABS: 80.0000 mg | ORAL_TABLET | Freq: Two times a day (BID) | ORAL | Status: DC

## 2018-03-14 NOTE — H&P (Signed)
[facility] History and Physical  SHATONA ANDUJAR ZOX:[id number] DOB: 1971/03/12 DOA: 03/14/2018   PCP: Shirlean Mylar, MD  Specialists: Dr. Shirlee Latch is her cardiologist.  Chief Complaint: Lower abdominal pain with fever  HPI: KAMREE WIENS is a 47 y.o. female with a past medical history of chronic systolic CHF with ejection fraction of 40 to 45% based on echocardiogram from October, history of rheumatoid arthritis followed by rheumatology in [facility] on multiple immunosuppressants, history of diabetes mellitus type 2, history of asthma who developed yeast infection in her lower abdomen and in the groin area for which she has been taking antifungal tablet as well as an antifungal cream.  This has been ongoing for about 2 weeks.  She has had a lot of burning sensation in the lower part at the site of these lesions.  She tells me that with treatment the lesions have been improving.  And then about 3 days ago she started developing pain in the lower abdomen which is slightly different.  She had fever and chills last night although she did not check her temperature.  The pain was 8 out of 10 in intensity.  She had nausea and vomiting x1.  She had a loose stool yesterday.  She has had poor oral intake.  In the emergency department she underwent a CT scan which revealed sigmoid diverticulitis with fluid collection suspicious for abscess.  She was hospitalized for further management.  General surgery was consulted.  Urine pregnancy test was negative.  Last menstrual period was 12th of November.  Home Medications: Prior to Admission medications   Medication Sig Start Date End Date Taking? Authorizing Provider  Adalimumab (HUMIRA) 20 MG/0.2ML PSKT Inject into the skin 2 (two) times a week.   Yes [provider]  albuterol (PROAIR HFA) 108 (90 BASE) MCG/ACT inhaler Inhale 2 puffs into the lungs every 6 (six) hours as needed for wheezing or shortness of breath.   Yes [provider]    albuterol (PROVENTIL) (2.5 MG/3ML) 0.083% nebulizer solution Take 2.5 mg by nebulization every 6 (six) hours as needed for wheezing or shortness of breath.   Yes [provider]  clotrimazole (LOTRIMIN) 1 % cream Apply 1 application topically 2 (two) times daily. 03/13/18  Yes [provider]  ferrous gluconate (FERGON) 324 MG tablet Take 324 mg by mouth daily.   Yes [provider]  folic acid (FOLVITE) 1 MG tablet Take 1 mg by mouth daily.   Yes [provider]  furosemide (LASIX) 40 MG tablet Take 3 tablets (120mg ) in the mornings and 2 tablets (80mg ) in the evenings by mouth daily Patient taking differently: Take 120 mg by mouth every morning.  05/20/17  Yes Graciella Freer, PA-C  hydroxychloroquine (PLAQUENIL) 200 MG tablet Take 400 mg by mouth daily.    Yes [provider]  isosorbide-hydrALAZINE (BIDIL) 20-37.5 MG tablet Take 2 tablets by mouth 3 (three) times daily. 05/20/17  Yes Graciella Freer, PA-C  levocetirizine (XYZAL) 5 MG tablet Take 5 mg by mouth at bedtime.    Yes [provider]  metFORMIN (GLUCOPHAGE) 500 MG tablet Take 1,000 mg by mouth 2 (two) times daily with a meal.    Yes [provider]  methocarbamol (ROBAXIN) 500 MG tablet Take 500 mg by mouth 2 (two) times daily as needed (for muscle spasms.).    Yes [provider]  Methotrexate, PF, 25 MG/0.5ML SOAJ Inject 0.7 mLs into the skin once a week. On Friday  Yes [provider]  metoprolol succinate (TOPROL-XL) 50 MG 24 hr tablet Take 1.5 tabs daily 01/26/18  Yes Laurey Morale, MD  omeprazole (PRILOSEC) 40 MG capsule Take 40 mg by mouth daily before breakfast.  09/29/16  Yes [provider]  sacubitril-valsartan (ENTRESTO) 97-103 MG Take 1 tablet by mouth 2 (two) times daily. 05/20/17  Yes Graciella Freer, PA-C  SYMBICORT 160-4.5 MCG/ACT inhaler Inhale 2 puffs into the lungs daily as needed (for respiratory issues.).   06/01/13  Yes [provider]  traMADol (ULTRAM) 50 MG tablet Take 1 tablet (50 mg total) by mouth 4 (four) times daily. pain Patient taking differently: Take 50 mg by mouth 4 (four) times daily as needed (for pain.).  03/05/12  Yes Rai, Ripudeep K, MD  triamcinolone cream (KENALOG) 0.1 % Apply 1 application topically 2 (two) times daily as needed (for ezcema.).   Yes [provider]  valACYclovir (VALTREX) 500 MG tablet Take 500 mg by mouth 2 (two) times daily as needed (for cold sores.).   Yes [provider]  spironolactone (ALDACTONE) 25 MG tablet Take 1 tablet (25 mg total) by mouth daily. 05/20/17 01/26/18  Graciella Freer, PA-C    Allergies:  Allergies  Allergen Reactions  . Clarithromycin Other (See Comments)    upset stomach  . Hydrocodone Nausea Only  . Ondansetron Itching  . Penicillins Swelling    Has patient had a PCN reaction causing immediate rash, facial/tongue/throat swelling, SOB or lightheadedness with hypotension: Unknown Has patient had a PCN reaction causing severe rash involving mucus membranes or skin necrosis: No Has patient had a PCN reaction that required hospitalization: No Has patient had a PCN reaction occurring within the last 10 years: No If all of the above answers are "NO", then may proceed with Cephalosporin use.   . Tdap [Tetanus-Diphth-Acell Pertussis] Other (See Comments)    Swelling at injection site--hot to touch at site    Past Medical History: Past Medical History:  Diagnosis Date  . Allergy   . Asthma   . Cardiomyopathy    Non-ischemic  . Diabetes mellitus without complication (HCC)   . Herpes simplex of female genitalia   . Hypertension   . Rheumatoid arthritis(714.0)     Past Surgical History:  Procedure Laterality Date  . DENTAL SURGERY    . OVARY SURGERY    . RIGHT HEART CATH N/A 01/18/2017   Procedure: RIGHT HEART CATH;  Surgeon: Laurey Morale, MD;  Location: [facility];  Service:  Cardiovascular;  Laterality: N/A;    Social History: Lives in [place].  Denies smoking alcohol use or illicit drug use.  Usually independent with daily activities.   Family History:  Family History  Problem Relation Age of Onset  . Heart attack Mother   . CAD Mother   . Allergies Mother   . Breast cancer Mother   . Hypertension Unknown   . Diabetes Mellitus II Unknown      Review of Systems - History obtained from the patient General ROS: positive for  - fatigue and fever Psychological ROS: negative Ophthalmic ROS: negative ENT ROS: negative Allergy and Immunology ROS: negative Hematological and Lymphatic ROS: negative Endocrine ROS: negative Respiratory ROS: no cough, shortness of breath, or wheezing Cardiovascular ROS: no chest pain or dyspnea on exertion Gastrointestinal ROS: as in hpi Genito-Urinary ROS: no dysuria, trouble voiding, or hematuria Musculoskeletal ROS: negative Neurological ROS: no TIA or stroke symptoms Dermatological ROS: as in hpi  Physical  Examination  Vitals:   03/14/18 1232 03/14/18 1426 03/14/18 1638  BP: 132/67 114/66 (!) 145/78  Pulse: 99 96 (!) 112  Resp: 18 17 20   Temp: 99 F (37.2 C)    TempSrc: Oral    SpO2: 99% 99% 99%    BP (!) 145/78 (BP Location: Right Arm)   Pulse (!) 112   Temp 99 F (37.2 C) (Oral)   Resp 20   LMP 02/22/2018 Comment: negative urine pregnancy test 03-14-2018  SpO2 99%   General appearance: alert, cooperative, appears stated age and no distress Head: Normocephalic, without obvious abnormality, atraumatic Eyes: conjunctivae/corneas clear. PERRL, EOM's intact.  Throat: lips, mucosa, and tongue normal; teeth and gums normal Neck: no adenopathy, no carotid bruit, no JVD, supple, symmetrical, trachea midline and thyroid not enlarged, symmetric, no tenderness/mass/nodules Resp: clear to auscultation bilaterally Cardio: regular rate and rhythm, S1, S2 normal, no murmur, click, rub or gallop GI: Abdomen soft.   Tender in the lower abdomen without any rebound rigidity or guarding.  Bowel sounds present. Extremities: extremities normal, atraumatic, no cyanosis or edema Pulses: 2+ and symmetric Skin: Erythematous lesions noted in the lower abdomen as well as in the groin area.  Tender to palpation.  Some central clearing Lymph nodes: Cervical, supraclavicular, and axillary nodes normal. Neurologic: Alert and oriented x3.  Cranial nerves II through XII intact.  Motor strength equal bilateral upper and lower extremities.   Labs on Admission: I have personally reviewed following labs and imaging studies  CBC: Recent Labs  Lab 03/14/18 1319  WBC 22.5*  NEUTROABS 17.9*  HGB 8.7*  HCT 28.5*  MCV 97.3  PLT 316   Basic Metabolic Panel: Recent Labs  Lab 03/14/18 1319  NA 135  K 3.8  CL 105  CO2 20*  GLUCOSE 140*  BUN 40*  CREATININE 1.21*  CALCIUM 8.3*   GFR: CrCl cannot be calculated (Unknown ideal weight.). Liver Function Tests: Recent Labs  Lab 03/14/18 1319  AST 17  ALT 17  ALKPHOS 76  BILITOT 0.7  PROT 8.2*  ALBUMIN 3.5   Recent Labs  Lab 03/14/18 1319  LIPASE 30    Radiological Exams on Admission: Ct Abdomen Pelvis W Contrast  Result Date: 03/14/2018 CLINICAL DATA:  Lower abdominal pain, sepsis. EXAM: CT ABDOMEN AND PELVIS WITH CONTRAST TECHNIQUE: Multidetector CT imaging of the abdomen and pelvis was performed using the standard protocol following bolus administration of intravenous contrast. CONTRAST:  14/06/2017 ISOVUE-300 IOPAMIDOL (ISOVUE-300) INJECTION 61% COMPARISON:  None. FINDINGS: Lower chest: No acute abnormality. Hepatobiliary: Cholelithiasis is noted. No biliary dilatation is noted. The liver appears grossly normal. Pancreas: Unremarkable. No pancreatic ductal dilatation or surrounding inflammatory changes. Spleen: Normal in size without focal abnormality. Adrenals/Urinary Tract: Adrenal glands are unremarkable. Kidneys are normal, without renal calculi, focal  lesion, or hydronephrosis. Bladder is unremarkable. Stomach/Bowel: Stomach and appendix are unremarkable. Sigmoid diverticulitis is noted. This results in secondary inflammation of adjacent small bowel loops. Also noted is 5.2 x 4.4 cm fluid collection in pre rectal space concerning for abscess. Vascular/Lymphatic: No significant vascular findings are present. No enlarged abdominal or pelvic lymph nodes. Reproductive: Uterus and bilateral adnexa are unremarkable. Other: No hernia is noted. Musculoskeletal: No acute or significant osseous findings. IMPRESSION: Sigmoid diverticulitis is noted resulting in secondary inflammation and thickening of adjacent small bowel loops. 5.2 x 4.4 cm probable abscess seen in prerectal space. Electronically Signed   By: , M.D.   On: 03/14/2018 15:33  Problem List  Principal Problem:   Sigmoid diverticulitis Active Problems:   Rheumatoid arthritis (HCC)   Chronic systolic heart failure (HCC)   Intra-abdominal abscess (HCC)   Assessment: This is a 47 year old African-American female with a past medical history as stated earlier who presents with lower abdominal pain with fever and chills.  She is found to have acute sigmoid diverticulitis with a possible abscess.  She will need hospitalization for further management.  Plan:  #1 Acute sigmoid diverticulitis with abscess: General surgery has been consulted.  Patient will be placed on ciprofloxacin and metronidazole.  She is allergic to penicillin.  Due to concern for yeast infection she will be placed on Diflucan as well.  Her WBC is noted to be elevated.  She is afebrile but has slightly elevated heart rate.  No clear sepsis is identified at this time.  Otherwise she is hemodynamically stable.  Blood cultures.  Further management per general surgery.  #2  Yeast infection: She has skin lesions in the lower abdomen and in the groin area.  This is been treated by her primary care provider so with  antifungal agents.  She will be continued on topical antifungal agents.  #3  Chronic systolic CHF: EF noted to be 40 to 45% based on last echocardiogram done in October.  She is followed by the heart failure clinic.  Her elevated BUN and creatinine suggest a degree of hypovolemia.  She will be gently hydrated.  We will hold her diuretics and her Entresto for now. Continue with metoprolol BiDil also from tomorrow. If there is a change in her hemodynamics these medications may have to be stopped.  Hold her spironolactone.  May need to consult cardiology if there is need for surgical intervention.  #4 Mild acute renal failure: BUN and creatinine noted to be higher than her usual baseline.  Gentle hydration will be provided during the next 12 hours.  Hold her diuretics and her Entresto for now.  Monitor urine output.  #5  Diabetes mellitus type 2: Check CBGs.  SSI.  Hold her metformin for now.  HbA1c will be checked in the morning.  #6  History of rheumatoid arthritis: Patient on multiple immunosuppressants.  Has not been on steroids in the last 6 months.  She takes steroids only as needed for flareups.  She is noted to be on methotrexate and hydroxychloroquine.  She gets Humira injections but has not received them in a few weeks.  We will hold all of her immunosuppressants for now.  She is followed by rheumatology in [facility].  #7  Normocytic anemia: Check anemia panel in the morning.  Hemoglobin slightly lower than her baseline.  No evidence of overt bleeding.    DVT Prophylaxis: SCDs Code Status: Full code Family Communication: Discussed with the patient.  No family at bedside Consults called: General surgery  Severity of Illness: The appropriate patient status for this patient is INPATIENT. Inpatient status is judged to be reasonable and necessary in order to provide the required intensity of service to ensure the patient's safety. The patient's presenting symptoms, physical exam findings, and  initial radiographic and laboratory data in the context of their chronic comorbidities is felt to place them at high risk for further clinical deterioration. Furthermore, it is not anticipated that the patient will be medically stable for discharge from the hospital within 2 midnights of admission. The following factors support the patient status of inpatient.   " The patient's presenting symptoms include abdominal pain nausea  vomiting. " The worrisome physical exam findings include lower abdominal tenderness. " The initial radiographic and laboratory data are worrisome because of intra-abdominal abscess. " The chronic co-morbidities include chronic systolic CHF.   * I certify that at the point of admission it is my clinical judgment that the patient will require inpatient hospital care spanning beyond 2 midnights from the point of admission due to high intensity of service, high risk for further deterioration and high frequency of surveillance required.*    Further management decisions will depend on results of further testing and patient's response to treatment.   Osvaldo Shipper  [facility] Pager [phone]  If 7PM-7AM, please contact night-coverage [email or web address] Password [facility]  03/14/2018, 5:41 PM

## 2018-03-14 NOTE — ED Notes (Addendum)
Pt reports that she feels better now that she urinated and ready to go home.  Pt reports that she been using cream twice a day for her tender, raw skin on thighs from yeast infection. Reports been taking 2 extra strength Tylenol every 4-6 hours.  Pt c/o lower back pains for 2 days and pressure with urination

## 2018-03-14 NOTE — ED Notes (Signed)
Bed: WA09 Expected date:  Expected time:  Means of arrival:  Comments: EMS 46yo code sepsis

## 2018-03-14 NOTE — ED Triage Notes (Signed)
Brought in by [facility] from [place] for possible sepsis: eleveated WBC of 19, tachycardia, fever. Yeast infection x week, finished antibiotics yesterday, but still having lower abd pains.  Hx CHF.  20g in left AC given NS 250 ML in route.

## 2018-03-14 NOTE — Progress Notes (Signed)
Received report from Ian Malkin, RN in ED that is caring for patient.  Awaiting patients arrival to floor.

## 2018-03-14 NOTE — ED Provider Notes (Signed)
[facility]-EMERGENCY DEPT Provider Note   CSN: [id number] Arrival date & time: 03/14/18  1214     History   Chief Complaint Chief Complaint  Patient presents with  . elevated WBC  . sent from [facility]    HPI [name] is a 47 y.o. female.  HPI  47 year old female with abdominal pain.  She was seen in PCPs office today and referred to the emergency room.  Concerning abdominal exam and new leukocytosis.  She was recently seen by them and treated for intertrigo.  She reports for the past several days she has been having increasing lower abdominal pain.  Or suprapubically.  Increased "pulling" sensation when she urinates.  Also some nonlateralizing lower back pain.  Subjective fevers.  Past Medical History:  Diagnosis Date  . Allergy   . Asthma   . Cardiomyopathy    Non-ischemic  . Diabetes mellitus without complication (HCC)   . Herpes simplex of female genitalia   . Hypertension   . Rheumatoid arthritis(714.0)     Patient Active Problem List   Diagnosis Date Noted  . Chronic systolic heart failure (HCC) 05/20/2017  . Alopecia areata 08/26/2016  . Palpitations 10/31/2014  . Non-ischemic cardiomyopathy (HCC) 10/31/2014  . Pulmonary infiltrates 08/01/2013  . Cough 08/01/2013  . 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 Rheumatoid arthritis (HCC) 05/24/2012  . Hypertension 03/03/2012  . GERD (gastroesophageal reflux disease) 03/03/2012  . Morbid obesity,BMI of 45.0-49.9, adult 03/03/2012  . Rheumatoid arthritis(714.0) 03/03/2012  . Diabetes mellitus, type 2 (HCC) 03/03/2012  . Hypokalemia 03/03/2012  . Lupus (HCC) 03/02/2012  . ASTHMA 06/04/2010    Past Surgical History:  Procedure Laterality Date  . DENTAL SURGERY    . OVARY SURGERY    . RIGHT HEART CATH N/A 01/18/2017   Procedure: RIGHT HEART CATH;  Surgeon: Laurey Morale, MD;  Location: [facility] INVASIVE CV LAB;  Service:  Cardiovascular;  Laterality: N/A;     OB History   None      Home Medications    Prior to Admission medications   Medication Sig Start Date End Date Taking? Authorizing Provider  Adalimumab (HUMIRA) 20 MG/0.2ML PSKT Inject into the skin 2 (two) times a week.    [provider]  albuterol (PROAIR HFA) 108 (90 BASE) MCG/ACT inhaler Inhale 2 puffs into the lungs every 6 (six) hours as needed for wheezing or shortness of breath.    [provider]  albuterol (PROVENTIL) (2.5 MG/3ML) 0.083% nebulizer solution Take 2.5 mg by nebulization every 6 (six) hours as needed for wheezing or shortness of breath.    [provider]  ferrous gluconate (FERGON) 324 MG tablet Take 324 mg by mouth daily.    [provider]  fluticasone (FLONASE) 50 MCG/ACT nasal spray Place 2 sprays into both nostrils daily as needed (for allergies.).     [provider]  folic acid (FOLVITE) 1 MG tablet Take 1 mg by mouth daily.    [provider]  furosemide (LASIX) 40 MG tablet Take 3 tablets (120mg ) in the mornings and 2 tablets (80mg ) in the evenings by mouth daily 05/20/17   , PA-C  hydroxychloroquine (PLAQUENIL) 200 MG tablet Take 400 mg by mouth daily.     [provider]  isosorbide-hydrALAZINE (BIDIL) 20-37.5 MG tablet Take 2 tablets by mouth 3 (three) times daily. 05/20/17   Graciella Freer, PA-C  levocetirizine (XYZAL) 5 MG  tablet Take 5 mg by mouth at bedtime.     [provider]  metFORMIN (GLUCOPHAGE) 500 MG tablet Take 1,000 mg by mouth 2 (two) times daily with a meal.     [provider]  methocarbamol (ROBAXIN) 500 MG tablet Take 500 mg by mouth 2 (two) times daily as needed (for muscle spasms.).     [provider]  Methotrexate, PF, 25 MG/0.5ML SOAJ Inject 0.7 mLs into the skin once a week. On Friday    [provider]  metoprolol succinate (TOPROL-XL) 50 MG 24 hr tablet Take 1.5  tabs daily 01/26/18   Laurey Morale, MD  omeprazole (PRILOSEC) 40 MG capsule Take 40 mg by mouth daily before breakfast.  09/29/16   [provider]  sacubitril-valsartan (ENTRESTO) 97-103 MG Take 1 tablet by mouth 2 (two) times daily. 05/20/17   Graciella Freer, PA-C  spironolactone (ALDACTONE) 25 MG tablet Take 1 tablet (25 mg total) by mouth daily. 05/20/17 01/26/18  Graciella Freer, PA-C  SYMBICORT 160-4.5 MCG/ACT inhaler Inhale 2 puffs into the lungs daily as needed (for respiratory issues.).  06/01/13   [provider]  traMADol (ULTRAM) 50 MG tablet Take 1 tablet (50 mg total) by mouth 4 (four) times daily. pain Patient taking differently: Take 50 mg by mouth 4 (four) times daily as needed (for pain.).  03/05/12   Rai, Ripudeep K, MD  triamcinolone cream (KENALOG) 0.1 % Apply 1 application topically 2 (two) times daily as needed (for ezcema.).    [provider]  valACYclovir (VALTREX) 500 MG tablet Take 500 mg by mouth 2 (two) times daily as needed (for cold sores.).    [provider]    Family History Family History  Problem Relation Age of Onset  . Heart attack Mother   . CAD Mother   . Allergies Mother   . Breast cancer Mother   . Hypertension Unknown   . Diabetes Mellitus II Unknown     Social History Social History   Tobacco Use  . Smoking status: Never Smoker  . Smokeless tobacco: Never Used  Substance Use Topics  . Alcohol use: No  . Drug use: No     Allergies   Clarithromycin; Hydrocodone; Ondansetron; Penicillins; and Tdap [tetanus-diphth-acell pertussis]   Review of Systems Review of Systems  All systems reviewed and negative, other than as noted in HPI.  Physical Exam Updated Vital Signs BP 132/67 (BP Location: Right Arm)   Pulse 99   Temp 99 F (37.2 C) (Oral)   Resp 18   LMP 02/22/2018   SpO2 99%   Physical Exam  Constitutional: She appears well-developed and well-nourished. No distress.    HENT:  Head: Normocephalic and atraumatic.  Eyes: Conjunctivae are normal. Right eye exhibits no discharge. Left eye exhibits no discharge.  Neck: Neck supple.  Cardiovascular: Normal rate, regular rhythm and normal heart sounds. Exam reveals no gallop and no friction rub.  No murmur heard. Pulmonary/Chest: Effort normal and breath sounds normal. No respiratory distress.  Abdominal: Soft. She exhibits no distension. There is tenderness.  Diffuse abdominal tenderness, worse in the lower abdomen.  Does not lateralize.  Obese abdomen.  Does not seem particularly distended.  Musculoskeletal: She exhibits no edema or tenderness.  Neurological: She is alert.  Skin: Skin is warm and dry.  Psychiatric: She has a normal mood and affect. Her behavior is normal. Thought content normal.  Nursing note and vitals reviewed.    ED Treatments /  Results  Labs (all labs ordered are listed, but only abnormal results are displayed) Labs Reviewed  CBC WITH DIFFERENTIAL/PLATELET - Abnormal; Notable for the following components:      Result Value   WBC 22.5 (*)    RBC 2.93 (*)    Hemoglobin 8.7 (*)    HCT 28.5 (*)    RDW 17.0 (*)    Neutro Abs 17.9 (*)    Monocytes Absolute 2.6 (*)    Abs Immature Granulocytes 0.23 (*)    All other components within normal limits  COMPREHENSIVE METABOLIC PANEL - Abnormal; Notable for the following components:   CO2 20 (*)    Glucose, Bld 140 (*)    BUN 40 (*)    Creatinine, Ser 1.21 (*)    Calcium 8.3 (*)    Total Protein 8.2 (*)    GFR calc non Af Amer 54 (*)    All other components within normal limits  URINALYSIS, ROUTINE W REFLEX MICROSCOPIC - Abnormal; Notable for the following components:   APPearance HAZY (*)    Protein, ur 30 (*)    All other components within normal limits  LIPASE, BLOOD  PREGNANCY, URINE    EKG None  Radiology Ct Abdomen Pelvis W Contrast  Result Date: 03/14/2018 CLINICAL DATA:  Lower abdominal pain, sepsis. EXAM: CT  ABDOMEN AND PELVIS WITH CONTRAST TECHNIQUE: Multidetector CT imaging of the abdomen and pelvis was performed using the standard protocol following bolus administration of intravenous contrast. CONTRAST:  ISOVUE-300 IOPAMIDOL (ISOVUE-300) INJECTION 61% COMPARISON:  None. FINDINGS: Lower chest: No acute abnormality. Hepatobiliary: Cholelithiasis is noted. No biliary dilatation is noted. The liver appears grossly normal. Pancreas: Unremarkable. No pancreatic ductal dilatation or surrounding inflammatory changes. Spleen: Normal in size without focal abnormality. Adrenals/Urinary Tract: Adrenal glands are unremarkable. Kidneys are normal, without renal calculi, focal lesion, or hydronephrosis. Bladder is unremarkable. Stomach/Bowel: Stomach and appendix are unremarkable. Sigmoid diverticulitis is noted. This results in secondary inflammation of adjacent small bowel loops. Also noted is 5.2 x 4.4 cm fluid collection in pre rectal space concerning for abscess. Vascular/Lymphatic: No significant vascular findings are present. No enlarged abdominal or pelvic lymph nodes. Reproductive: Uterus and bilateral adnexa are unremarkable. Other: No hernia is noted. Musculoskeletal: No acute or significant osseous findings. IMPRESSION: Sigmoid diverticulitis is noted resulting in secondary inflammation and thickening of adjacent small bowel loops. 5.2 x 4.4 cm probable abscess seen in prerectal space. Electronically Signed   By: Lupita Raider, M.D.   On: 03/14/2018 15:33    Procedures Procedures (including critical care time)  Medications Ordered in ED Medications  iopamidol (ISOVUE-300) 61 % injection (has no administration in time range)  sodium chloride (PF) 0.9 % injection (has no administration in time range)  ciprofloxacin (CIPRO) IVPB 400 mg (400 mg Intravenous New Bag/Given 03/14/18 1616)  metroNIDAZOLE (FLAGYL) IVPB 500 mg (has no administration in time range)  HYDROmorphone (DILAUDID) injection 0.5 mg (0.5  mg Intravenous Given 03/14/18 1316)  sodium chloride 0.9 % bolus 1,000 mL (1,000 mLs Intravenous New Bag/Given (Non-Interop) 03/14/18 1424)  iopamidol (ISOVUE-300) 61 % injection 100 mL (100 mLs Intravenous Contrast Given 03/14/18 1501)     Initial Impression / Assessment and Plan / ED Course  I have reviewed the triage vital signs and the nursing notes.  Pertinent labs & imaging results that were available during my care of the patient were reviewed by me and considered in my medical decision making (see chart for details).     47 year old  female with lower abdominal pain.  Imaging significant for diverticulitis with abscess.  Surgery consulted.  Given underlying medical history, requesting medical admission.  Hospitalist service was consulted.  Cipro/Flagyl ordered.  Patient updated.  Final Clinical Impressions(s) / ED Diagnoses   Final diagnoses:  Diverticulitis of large intestine with abscess without bleeding    ED Discharge Orders    None       Raeford Razor, MD 03/14/18 [id number]

## 2018-03-14 NOTE — Consult Note (Signed)
Reason for Consult: intertrigo, sigmoid diverticulitis with pre-rectal fluid collection/abscess abscess measuring 5.2 x 4.4 cm;  on Humiera, and Plaquenil  Referring Physician: Marylen Ponto PCP:  Shirlean Mylar, MD Cardiology: [facility] - in [facility]  [name] is an 47 y.o. female.    HPI: Patient is a 47 year old female who was brought from [facility] as a possible code sepsis with elevated WBC, tachycardia, fever yeast infection after a week of antibiotics. Patient has a history of nonischemic cardiomyopathy, chronic systolic congestive heart failure and rheumatoid arthritis.  She has an EF in the range of 25 to 30% now up to 40-45%  She has been treated by [facility] for intertrigo for about a week and completed a course of "antibiotics."  We are not sure what she was receiving.  About 3 days ago she started having some new abdominal pain that was higher than what she had before.  She has had some nausea and vomited x 1, then felt better for a time.  She has had ongoing pain, night sweats and chills, not eating or drinking much secondary to the abdominal discomfort.  She was seen in their office today and transferred to the ED.   work-up on admission in the ED shows she is afebrile and her vital signs are stable.  She is slightly tachycardic with a heart rate in the upper 90s.  Labs show a glucose of 140, a creatinine of 1.21, LFTs are normal.  WBC is 22.5, hemoglobin 8.7, hematocrit 28.5, platelets are 316,000.  Her last creatinine on 01/26/2018 was 0.82.  A CT scan was obtained of the abdomen pelvis with contrast.  This shows sigmoid diverticulitis with secondary inflammation of the adjacent small bowel loops.  Also noted is a 5.2 x 4.4 cm fluid collection in the prerectal space concerning for abscess.  We are asked to see.  Past Medical History:  Diagnosis Date  . Allergy   . Asthma   . Cardiomyopathy    Non-ischemic  . Diabetes mellitus without complication  (HCC) "she doesn't claim it."   . Herpes simplex of female genitalia   . Hypertension   . Rheumatoid arthritis(714.0)     Past Surgical History:  Procedure Laterality Date  . DENTAL SURGERY    . OVARY SURGERY    . RIGHT HEART CATH N/A 01/18/2017   Procedure: RIGHT HEART CATH;  Surgeon: Laurey Morale, MD;  Location: [facility] INVASIVE CV LAB;  Service: Cardiovascular;  Laterality: N/A;    Family History  Problem Relation Age of Onset  . Heart attack Mother   . CAD Mother   . Allergies Mother   . Breast cancer Mother   . Hypertension Unknown   . Diabetes Mellitus II Unknown     Social History:  reports that she has never smoked. She has never used smokeless tobacco. She reports that she does not drink alcohol or use drugs.  Allergies:  Allergies  Allergen Reactions  . Clarithromycin Other (See Comments)    upset stomach  . Hydrocodone Nausea Only  . Ondansetron Itching  . Penicillins Swelling    Has patient had a PCN reaction causing immediate rash, facial/tongue/throat swelling, SOB or lightheadedness with hypotension: Unknown Has patient had a PCN reaction causing severe rash involving mucus membranes or skin necrosis: No Has patient had a PCN reaction that required hospitalization: No Has patient had a PCN reaction occurring within the last 10 years: No If all of the above answers are "  NO", then may proceed with Cephalosporin use.   . Tdap [Tetanus-Diphth-Acell Pertussis] Other (See Comments)    Swelling at injection site--hot to touch at site    Prior to Admission medications   Medication Sig Start Date End Date Taking? Authorizing Provider  Adalimumab (HUMIRA) 20 MG/0.2ML PSKT Inject into the skin 2 (two) times a week.   Yes [provider]  albuterol (PROAIR HFA) 108 (90 BASE) MCG/ACT inhaler Inhale 2 puffs into the lungs every 6 (six) hours as needed for wheezing or shortness of breath.   Yes [provider]  albuterol (PROVENTIL) (2.5 MG/3ML) 0.083%  nebulizer solution Take 2.5 mg by nebulization every 6 (six) hours as needed for wheezing or shortness of breath.   Yes [provider]  clotrimazole (LOTRIMIN) 1 % cream Apply 1 application topically 2 (two) times daily. 03/13/18  Yes [provider]  ferrous gluconate (FERGON) 324 MG tablet Take 324 mg by mouth daily.   Yes [provider]  folic acid (FOLVITE) 1 MG tablet Take 1 mg by mouth daily.   Yes [provider]  furosemide (LASIX) 40 MG tablet Take 3 tablets (120mg ) in the mornings and 2 tablets (80mg ) in the evenings by mouth daily Patient taking differently: Take 120 mg by mouth every morning.  05/20/17  Yes Graciella Freer, PA-C  hydroxychloroquine (PLAQUENIL) 200 MG tablet Take 400 mg by mouth daily.    Yes [provider]  isosorbide-hydrALAZINE (BIDIL) 20-37.5 MG tablet Take 2 tablets by mouth 3 (three) times daily. 05/20/17  Yes Graciella Freer, PA-C  levocetirizine (XYZAL) 5 MG tablet Take 5 mg by mouth at bedtime.    Yes [provider]  metFORMIN (GLUCOPHAGE) 500 MG tablet Take 1,000 mg by mouth 2 (two) times daily with a meal.    Yes [provider]  methocarbamol (ROBAXIN) 500 MG tablet Take 500 mg by mouth 2 (two) times daily as needed (for muscle spasms.).    Yes [provider]  Methotrexate, PF, 25 MG/0.5ML SOAJ Inject 0.7 mLs into the skin once a week. On Friday   Yes [provider]  metoprolol succinate (TOPROL-XL) 50 MG 24 hr tablet Take 1.5 tabs daily 01/26/18  Yes Laurey Morale, MD  omeprazole (PRILOSEC) 40 MG capsule Take 40 mg by mouth daily before breakfast.  09/29/16  Yes [provider]  sacubitril-valsartan (ENTRESTO) 97-103 MG Take 1 tablet by mouth 2 (two) times daily. 05/20/17  Yes Graciella Freer, PA-C  SYMBICORT 160-4.5 MCG/ACT inhaler Inhale 2 puffs into the lungs daily as needed (for respiratory issues.).  06/01/13  Yes [provider]    traMADol (ULTRAM) 50 MG tablet Take 1 tablet (50 mg total) by mouth 4 (four) times daily. pain Patient taking differently: Take 50 mg by mouth 4 (four) times daily as needed (for pain.).  03/05/12  Yes Rai, Ripudeep K, MD  triamcinolone cream (KENALOG) 0.1 % Apply 1 application topically 2 (two) times daily as needed (for ezcema.).   Yes [provider]  valACYclovir (VALTREX) 500 MG tablet Take 500 mg by mouth 2 (two) times daily as needed (for cold sores.).   Yes [provider]  spironolactone (ALDACTONE) 25 MG tablet Take 1 tablet (25 mg total) by mouth daily. 05/20/17 01/26/18  Graciella Freer, PA-C   Anti-infectives (From admission, onward)   Start     Dose/Rate Route Frequency Ordered Stop   03/14/18 1515  ciprofloxacin (CIPRO) IVPB 400 mg  400 mg 200 mL/hr over 60 Minutes Intravenous  Once 03/14/18 1512     03/14/18 1515  metroNIDAZOLE (FLAGYL) IVPB 500 mg     500 mg 100 mL/hr over 60 Minutes Intravenous  Once 03/14/18 1512        Results for orders placed or performed during the hospital encounter of 03/14/18 (from the past 48 hour(s))  Urinalysis, Routine w reflex microscopic     Status: Abnormal   Collection Time: 03/14/18  1:05 PM  Result Value Ref Range   Color, Urine YELLOW YELLOW   APPearance HAZY (A) CLEAR   Specific Gravity, Urine 1.026 1.005 - 1.030   pH 5.0 5.0 - 8.0   Glucose, UA NEGATIVE NEGATIVE mg/dL   Hgb urine dipstick NEGATIVE NEGATIVE   Bilirubin Urine NEGATIVE NEGATIVE   Ketones, ur NEGATIVE NEGATIVE mg/dL   Protein, ur 30 (A) NEGATIVE mg/dL   Nitrite NEGATIVE NEGATIVE   Leukocytes, UA NEGATIVE NEGATIVE   RBC / HPF 0-5 0 - 5 RBC/hpf   WBC, UA 6-10 0 - 5 WBC/hpf   Bacteria, UA NONE SEEN NONE SEEN   Squamous Epithelial / LPF 0-5 0 - 5   Mucus PRESENT    Hyaline Casts, UA PRESENT     Comment: Performed at [facility], [address] [place], [place] [address]  Pregnancy, urine     Status: None    Collection Time: 03/14/18  1:05 PM  Result Value Ref Range   Preg Test, Ur NEGATIVE NEGATIVE    Comment:        THE SENSITIVITY OF THIS METHODOLOGY IS >20 mIU/mL. Performed at [facility], [address] [place], [place] [address]   CBC with Differential     Status: Abnormal   Collection Time: 03/14/18  1:19 PM  Result Value Ref Range   WBC 22.5 (H) 4.0 - 10.5 K/uL   RBC 2.93 (L) 3.87 - 5.11 MIL/uL   Hemoglobin 8.7 (L) 12.0 - 15.0 g/dL   HCT 68.1 (L) 15.7 - 26.2 %   MCV 97.3 80.0 - 100.0 fL   MCH 29.7 26.0 - 34.0 pg   MCHC 30.5 30.0 - 36.0 g/dL   RDW 03.5 (H) 59.7 - 41.6 %   Platelets 316 150 - 400 K/uL   nRBC 0.0 0.0 - 0.2 %   Neutrophils Relative % 79 %   Neutro Abs 17.9 (H) 1.7 - 7.7 K/uL   Lymphocytes Relative 8 %   Lymphs Abs 1.7 0.7 - 4.0 K/uL   Monocytes Relative 12 %   Monocytes Absolute 2.6 (H) 0.1 - 1.0 K/uL   Eosinophils Relative 0 %   Eosinophils Absolute 0.0 0.0 - 0.5 K/uL   Basophils Relative 0 %   Basophils Absolute 0.0 0.0 - 0.1 K/uL   Immature Granulocytes 1 %   Abs Immature Granulocytes 0.23 (H) 0.00 - 0.07 K/uL    Comment: Performed at [facility], [address] [place], [place] [address]  Comprehensive metabolic panel     Status: Abnormal   Collection Time: 03/14/18  1:19 PM  Result Value Ref Range   Sodium 135 135 - 145 mmol/L   Potassium 3.8 3.5 - 5.1 mmol/L   Chloride 105 98 - 111 mmol/L   CO2 20 (L) 22 - 32 mmol/L   Glucose, Bld 140 (H) 70 - 99 mg/dL   BUN 40 (H) 6 - 20 mg/dL   Creatinine, Ser 6.46 (H) 0.44 - 1.00 mg/dL   Calcium  8.3 (L) 8.9 - 10.3 mg/dL   Total Protein 8.2 (H) 6.5 - 8.1 g/dL   Albumin 3.5 3.5 - 5.0 g/dL   AST 17 15 - 41 U/L   ALT 17 0 - 44 U/L   Alkaline Phosphatase 76 38 - 126 U/L   Total Bilirubin 0.7 0.3 - 1.2 mg/dL   GFR calc non Af Amer 54 (L) >60 mL/min   GFR calc Af Amer >60 >60 mL/min   Anion gap 10 5 - 15    Comment: Performed at [facility], [address], [place], [place] [address]  Lipase, blood     Status: None   Collection Time: 03/14/18  1:19 PM  Result Value Ref Range   Lipase 30 11 - 51 U/L    Comment: Performed at [facility], [address] [place], [place] [address]    Ct Abdomen Pelvis W Contrast  Result Date: 03/14/2018 CLINICAL DATA:  Lower abdominal pain, sepsis. EXAM: CT ABDOMEN AND PELVIS WITH CONTRAST TECHNIQUE: Multidetector CT imaging of the abdomen and pelvis was performed using the standard protocol following bolus administration of intravenous contrast. CONTRAST:  ISOVUE-300 IOPAMIDOL (ISOVUE-300) INJECTION 61% COMPARISON:  None. FINDINGS: Lower chest: No acute abnormality. Hepatobiliary: Cholelithiasis is noted. No biliary dilatation is noted. The liver appears grossly normal. Pancreas: Unremarkable. No pancreatic ductal dilatation or surrounding inflammatory changes. Spleen: Normal in size without focal abnormality. Adrenals/Urinary Tract: Adrenal glands are unremarkable. Kidneys are normal, without renal calculi, focal lesion, or hydronephrosis. Bladder is unremarkable. Stomach/Bowel: Stomach and appendix are unremarkable. Sigmoid diverticulitis is noted. This results in secondary inflammation of adjacent small bowel loops. Also noted is 5.2 x 4.4 cm fluid collection in pre rectal space concerning for abscess. Vascular/Lymphatic: No significant vascular findings are present. No enlarged abdominal or pelvic lymph nodes. Reproductive: Uterus and bilateral adnexa are unremarkable. Other: No hernia is noted. Musculoskeletal: No acute or significant osseous findings. IMPRESSION: Sigmoid diverticulitis is noted resulting in secondary inflammation and thickening of adjacent small bowel loops. 5.2 x 4.4 cm probable abscess seen in prerectal space. Electronically Signed   By: Lupita Raider, M.D.   On: 03/14/2018 15:33    Review of Systems  Constitutional: Positive for chills. Negative for diaphoresis,  fever, malaise/fatigue and weight loss.  HENT: Negative.   Eyes: Negative.   Respiratory: Negative.   Cardiovascular: Negative.   Gastrointestinal: Positive for abdominal pain, constipation (She has not been able to have a bowel movement for 2 to 3 days.), nausea and vomiting. Negative for blood in stool, diarrhea and melena.  Genitourinary: Negative.   Musculoskeletal: Positive for joint pain.       She has rheumatoid arthritis that primarily affects her hands and wrist.  This is fairly well-controlled with her current medications.  She reports she has been out of Humira for 2 weeks and is due for refill.  Skin:       Picture in the H&P, treated for interrogative for about 1 week.  Medications are not known.  This is along the lower portion of her abdominal pannus and both thighs that abut the pannus.  Neurological: Negative.   Endo/Heme/Allergies: Negative.   Psychiatric/Behavioral: Negative.    Blood pressure 114/66, pulse 96, temperature 99 F (37.2 C), temperature source Oral, resp. rate 17, last menstrual period 02/22/2018, SpO2 99 %. Physical Exam  Constitutional: She is oriented to person, place, and time. No distress.  Well-nourished obese female in no acute distress.  She  has a good deal of abdominal discomfort.  She did not want to even lower her trousers so we could look at her skin changes on her pannus and thighs.  HENT:  Head: Normocephalic.  Mouth/Throat: Oropharynx is clear and moist. No oropharyngeal exudate.  Eyes: Right eye exhibits no discharge. Left eye exhibits no discharge.  Pupils are equal  Neck: Normal range of motion. Neck supple. No JVD present. No tracheal deviation present. No thyromegaly present.  Cardiovascular: Normal rate, regular rhythm, normal heart sounds and intact distal pulses.  No murmur heard. Respiratory: Effort normal and breath sounds normal. No respiratory distress. She has no wheezes. She has no rales. She exhibits no tenderness.  GI:  Her  entire abdomen is tender to palpation.  It is soft she does not have overt peritonitis.  There is no rebound no guarding.  This is complicated by the skin changes of the pannus and thighs.  See picture below.  Musculoskeletal: She exhibits no edema or tenderness.  Lymphadenopathy:    She has no cervical adenopathy.  Neurological: She is alert and oriented to person, place, and time. No cranial nerve deficit.  Skin: She is not diaphoretic.  She has full thickness skin loss on the lower portion of her abdominal pannus also abutting both thighs.  She has skin loss on both the left and the right thigh in the same areas.  She does not really have much cellulitis now and the sites that are open are clean.  Psychiatric: She has a normal mood and affect. Her behavior is normal. Judgment and thought content normal.      Assessment/Plan: Sepsis Sigmoid diverticulitis with 5.4 x 4.4 cm pre-rectal abscess/fluid collection Secondary small bowel inflammation Intertrigo Rheumatoid arthritis, on Humira, Plaquenil, and methotrexate Nonischemic cardiomyopathy with an EF now up to 40-45% Hypertension  Acute kidney injury Hx diabetes mellitus -currently on no treatment for this. Obesity  Plan: Patient is being admitted by medicine.  She is currently on Cipro and Flagyl, Dr. Rito Ehrlich will add an antifungal.  I would keep her n.p.o. for now.  We will ask IR to evaluate in the a.m. and see if this is a drainable fluid collection.  I will asked the wound care nurses to see her tomorrow and help with the  intertrigo affecting her lower abdominal pannus and thighs.  Patient's been seen and evaluated by Dr. Johna Sheriff in the ED.  [name] 03/14/2018, 4:13 PM

## 2018-03-15 ENCOUNTER — Inpatient Hospital Stay (HOSPITAL_COMMUNITY): Payer: PPO

## 2018-03-15 DIAGNOSIS — K651 Peritoneal abscess: Secondary | ICD-10-CM

## 2018-03-15 LAB — PROTIME-INR
INR: 0.99
Prothrombin Time: 13 seconds (ref 11.4–15.2)

## 2018-03-15 LAB — RETICULOCYTES
Immature Retic Fract: 6.3 % (ref 2.3–15.9)
RBC.: 2.92 MIL/uL — ABNORMAL LOW (ref 3.87–5.11)
Retic Count, Absolute: 39.1 10*3/uL (ref 19.0–186.0)
Retic Ct Pct: 1.3 % (ref 0.4–3.1)

## 2018-03-15 LAB — IRON AND TIBC
Iron: 10 ug/dL — ABNORMAL LOW (ref 28–170)
SATURATION RATIOS: 4 % — AB (ref 10.4–31.8)
TIBC: 265 ug/dL (ref 250–450)
UIBC: 255 ug/dL

## 2018-03-15 LAB — GLUCOSE, CAPILLARY
Glucose-Capillary: 101 mg/dL — ABNORMAL HIGH (ref 70–99)
Glucose-Capillary: 124 mg/dL — ABNORMAL HIGH (ref 70–99)
Glucose-Capillary: 118 mg/dL — ABNORMAL HIGH (ref 70–99)
Glucose-Capillary: 93 mg/dL (ref 70–99)
Glucose-Capillary: 101 mg/dL — ABNORMAL HIGH (ref 70–99)
Glucose-Capillary: 99 mg/dL (ref 70–99)

## 2018-03-15 LAB — FERRITIN: Ferritin: 460 ng/mL — ABNORMAL HIGH (ref 11–307)

## 2018-03-15 LAB — FOLATE: FOLATE: 20.2 ng/mL (ref >5.9)

## 2018-03-15 LAB — COMPREHENSIVE METABOLIC PANEL
ALT: 16 U/L (ref 0–44)
AST: 16 U/L (ref 15–41)
Albumin: 3.3 g/dL — ABNORMAL LOW (ref 3.5–5.0)
Alkaline Phosphatase: 72 U/L (ref 38–126)
Anion gap: 12 (ref 5–15)
BUN: 27 mg/dL — ABNORMAL HIGH (ref 6–20)
CO2: 18 mmol/L — ABNORMAL LOW (ref 22–32)
Calcium: 8.4 mg/dL — ABNORMAL LOW (ref 8.9–10.3)
Chloride: 108 mmol/L (ref 98–111)
Creatinine, Ser: 0.96 mg/dL (ref 0.44–1.00)
GFR calc Af Amer: >60 mL/min (ref >60)
GFR calc non Af Amer: >60 mL/min (ref >60)
Glucose, Bld: 133 mg/dL — ABNORMAL HIGH (ref 70–99)
POTASSIUM: 4 mmol/L (ref 3.5–5.1)
Sodium: 138 mmol/L (ref 135–145)
Total Bilirubin: 0.9 mg/dL (ref 0.3–1.2)
Total Protein: 7.9 g/dL (ref 6.5–8.1)

## 2018-03-15 LAB — CBC
HCT: 28.8 % — ABNORMAL LOW (ref 36.0–46.0)
Hemoglobin: 8.6 g/dL — ABNORMAL LOW (ref 12.0–15.0)
MCH: 29.5 pg (ref 26.0–34.0)
MCHC: 29.9 g/dL — ABNORMAL LOW (ref 30.0–36.0)
MCV: 98.6 fL (ref 80.0–100.0)
Platelets: 286 10*3/uL (ref 150–400)
RBC: 2.92 MIL/uL — ABNORMAL LOW (ref 3.87–5.11)
RDW: 17.2 % — ABNORMAL HIGH (ref 11.5–15.5)
WBC: 24.1 10*3/uL — ABNORMAL HIGH (ref 4.0–10.5)
nRBC: 0 % (ref 0.0–0.2)

## 2018-03-15 LAB — MRSA PCR SCREENING: MRSA by PCR: NEGATIVE

## 2018-03-15 LAB — HIV ANTIBODY (ROUTINE TESTING W REFLEX): HIV Screen 4th Generation wRfx: NONREACTIVE

## 2018-03-15 LAB — VITAMIN B12: Vitamin B-12: 118 pg/mL — ABNORMAL LOW (ref 180–914)

## 2018-03-15 LAB — HEMOGLOBIN A1C
Hgb A1c MFr Bld: 6.2 % — ABNORMAL HIGH (ref 4.8–5.6)
Mean Plasma Glucose: 131.24 mg/dL

## 2018-03-15 MED ORDER — SODIUM CHLORIDE 0.9 % IV SOLN
INTRAVENOUS | Status: AC
  Administered 2018-03-16: 02:00:00 via INTRAVENOUS

## 2018-03-15 MED ORDER — FLUMAZENIL 0.5 MG/5ML IV SOLN
INTRAVENOUS | Status: AC
  Filled 2018-03-15: qty 5

## 2018-03-15 MED ORDER — FENTANYL CITRATE (PF) 100 MCG/2ML IJ SOLN
INTRAMUSCULAR | Status: AC | PRN
  Administered 2018-03-15 (×2): 50 ug via INTRAVENOUS

## 2018-03-15 MED ORDER — FENTANYL CITRATE (PF) 100 MCG/2ML IJ SOLN
INTRAMUSCULAR | Status: AC
  Filled 2018-03-15: qty 4

## 2018-03-15 MED ORDER — SODIUM CHLORIDE 0.9% FLUSH
5.0000 mL | Freq: Three times a day (TID) | INTRAVENOUS | Status: DC
  Administered 2018-03-16 – 2018-03-26 (×23): 5 mL

## 2018-03-15 MED ORDER — MIDAZOLAM HCL 2 MG/2ML IJ SOLN
INTRAMUSCULAR | Status: AC
  Filled 2018-03-15: qty 4

## 2018-03-15 MED ORDER — LIDOCAINE HCL (PF) 1 % IJ SOLN
INTRAMUSCULAR | Status: AC | PRN
  Administered 2018-03-15: 10 mL

## 2018-03-15 MED ORDER — MIDAZOLAM HCL 2 MG/2ML IJ SOLN
INTRAMUSCULAR | Status: AC | PRN
  Administered 2018-03-15 (×3): 1 mg via INTRAVENOUS

## 2018-03-15 MED ORDER — NALOXONE HCL 0.4 MG/ML IJ SOLN
INTRAMUSCULAR | Status: AC
  Filled 2018-03-15: qty 1

## 2018-03-15 MED ORDER — LIP MEDEX EX OINT
TOPICAL_OINTMENT | CUTANEOUS | Status: AC
  Administered 2018-03-15: 16:00:00
  Filled 2018-03-15: qty 7

## 2018-03-15 MED ORDER — LEVOCETIRIZINE DIHYDROCHLORIDE 5 MG PO TABS: 5.0000 mg | ORAL_TABLET | Freq: Every day | ORAL | Status: DC

## 2018-03-15 MED ORDER — CETIRIZINE HCL 10 MG PO TABS
10.0000 mg | ORAL_TABLET | Freq: Every day | ORAL | Status: DC
  Administered 2018-03-15 – 2018-03-27 (×13): 10 mg via ORAL
  Filled 2018-03-15 (×17): qty 1

## 2018-03-15 NOTE — Discharge Instructions (Addendum)
Diverticulitis Diverticulitis is infection or inflammation of small pouches (diverticula) in the colon that form due to a condition called diverticulosis. Diverticula can trap stool (feces) and bacteria, causing infection and inflammation. Diverticulitis may cause severe stomach pain and diarrhea. It may lead to tissue damage in the colon that causes bleeding. The diverticula may also burst (rupture) and cause infected stool to enter other areas of the abdomen. Complications of diverticulitis can include:  Bleeding.  Severe infection.  Severe pain.  Rupture (perforation) of the colon.  Blockage (obstruction) of the colon.  What are the causes? This condition is caused by stool becoming trapped in the diverticula, which allows bacteria to grow in the diverticula. This leads to inflammation and infection. What increases the risk? You are more likely to develop this condition if:  You have diverticulosis. The risk for diverticulosis increases if: ? You are overweight or obese. ? You use tobacco products. ? You do not get enough exercise.  You eat a diet that does not include enough fiber. High-fiber foods include fruits, vegetables, beans, nuts, and whole grains.  What are the signs or symptoms? Symptoms of this condition may include:  Pain and tenderness in the abdomen. The pain is normally located on the left side of the abdomen, but it may occur in other areas.  Fever and chills.  Bloating.  Cramping.  Nausea.  Vomiting.  Changes in bowel routines.  Blood in your stool.  How is this diagnosed? This condition is diagnosed based on:  Your medical history.  A physical exam.  Tests to make sure there is nothing else causing your condition. These tests may include: ? Blood tests. ? Urine tests. ? Imaging tests of the abdomen, including X-rays, ultrasounds, MRIs, or CT scans.  How is this treated? Most cases of this condition are mild and can be treated at  home. Treatment may include:  Taking over-the-counter pain medicines.  Following a clear liquid diet.  Taking antibiotic medicines by mouth.  Rest.  More severe cases may need to be treated at a hospital. Treatment may include:  Not eating or drinking.  Taking prescription pain medicine.  Receiving antibiotic medicines through an IV tube.  Receiving fluids and nutrition through an IV tube.  Surgery.  When your condition is under control, your health care provider may recommend that you have a colonoscopy. This is an exam to look at the entire large intestine. During the exam, a lubricated, bendable tube is inserted into the anus and then passed into the rectum, colon, and other parts of the large intestine. A colonoscopy can show how severe your diverticula are and whether something else may be causing your symptoms. Follow these instructions at home: Medicines  Take over-the-counter and prescription medicines only as told by your health care provider. These include fiber supplements, probiotics, and stool softeners.  If you were prescribed an antibiotic medicine, take it as told by your health care provider. Do not stop taking the antibiotic even if you start to feel better.  Do not drive or use heavy machinery while taking prescription pain medicine. General instructions  Follow a full liquid diet or another diet as directed by your health care provider. After your symptoms improve, your health care provider may tell you to change your diet. He or she may recommend that you eat a diet that contains at least 25 g (25 grams) of fiber daily. Fiber makes it easier to pass stool. Healthy sources of fiber include: ? Berries. One  cup contains 4-8 grams of fiber. ? Beans or lentils. One half cup contains 5-8 grams of fiber. ? Green vegetables. One cup contains 4 grams of fiber.  Exercise for at least 30 minutes, 3 times each week. You should exercise hard enough to raise your heart  rate and break a sweat.  Keep all follow-up visits as told by your health care provider. This is important. You may need a colonoscopy. Contact a health care provider if:  Your pain does not improve.  You have a hard time drinking or eating food.  Your bowel movements do not return to normal. Get help right away if:  Your pain gets worse.  Your symptoms do not get better with treatment.  Your symptoms suddenly get worse.  You have a fever.  You vomit more than one time.  You have stools that are bloody, black, or tarry. Summary  Diverticulitis is infection or inflammation of small pouches (diverticula) in the colon that form due to a condition called diverticulosis. Diverticula can trap stool (feces) and bacteria, causing infection and inflammation.  You are at higher risk for this condition if you have diverticulosis and you eat a diet that does not include enough fiber.  Most cases of this condition are mild and can be treated at home. More severe cases may need to be treated at a hospital.  When your condition is under control, your health care provider may recommend that you have an exam called a colonoscopy. This exam can show how severe your diverticula are and whether something else may be causing your symptoms. This information is not intended to replace advice given to you by your health care provider. Make sure you discuss any questions you have with your health care provider. Document Released: 01/06/2005 Document Revised: 05/01/2016 Document Reviewed: 05/01/2016 Elsevier Interactive Patient Education  2018 [employer]   Moderate Conscious Sedation, Adult, Care After These instructions provide you with information about caring for yourself after your procedure. Your health care provider may also give you more specific instructions. Your treatment has been planned according to current medical practices, but problems sometimes occur. Call your health care provider if  you have any problems or questions after your procedure. What can I expect after the procedure? After your procedure, it is common:  To feel sleepy for several hours.  To feel clumsy and have poor balance for several hours.  To have poor judgment for several hours.  To vomit if you eat too soon.  Follow these instructions at home: For at least 24 hours after the procedure:   Do not: ? Participate in activities where you could fall or become injured. ? Drive. ? Use heavy machinery. ? Drink alcohol. ? Take sleeping pills or medicines that cause drowsiness. ? Make important decisions or sign legal documents. ? Take care of children on your own.  Rest. Eating and drinking  Follow the diet recommended by your health care provider.  If you vomit: ? Drink water, juice, or soup when you can drink without vomiting. ? Make sure you have little or no nausea before eating solid foods. General instructions  Have a responsible adult stay with you until you are awake and alert.  Take over-the-counter and prescription medicines only as told by your health care provider.  If you smoke, do not smoke without supervision.  Keep all follow-up visits as told by your health care provider. This is important. Contact a health care provider if:  You keep feeling nauseous  or you keep vomiting.  You feel light-headed.  You develop a rash.  You have a fever. Get help right away if:  You have trouble breathing. This information is not intended to replace advice given to you by your health care provider. Make sure you discuss any questions you have with your health care provider. Document Released: 01/17/2013 Document Revised: 09/01/2015 Document Reviewed: 07/19/2015 Elsevier Interactive Patient Education  [employer].

## 2018-03-15 NOTE — Consult Note (Signed)
Chief Complaint: Patient was seen in consultation today for CT-guided drainage of pelvic/diverticular abscess Chief Complaint  Patient presents with  . elevated WBC  . sent from [facility]    Referring Physician(s): Hoxworth,B  Supervising Physician: Ruel Favors  Patient Status: [facility] - In-pt  History of Present Illness: [name] is a 47 y.o. female with history of asthma, nonischemic cardiomyopathy, diabetes, hypertension, and rheumatoid arthritis who presented to the [facility] on 12/3 as a transfer from primary care MD with abdominal/pelvic pain, nausea, vomiting, night sweats/chills, elevated WBC.  She recently completed a course of antibiotics for intertrigo/MASD of abdominal pannus and bilateral upper thigh folds.  Subsequent imaging revealed sigmoid diverticulitis with 5.2 cm abscess in the perirectal space. Request now received from surgery for image guided drainage of the abscess.  Past Medical History:  Diagnosis Date  . Allergy   . Asthma   . Cardiomyopathy    Non-ischemic  . Diabetes mellitus without complication (HCC)   . Herpes simplex of female genitalia   . Hypertension   . Rheumatoid arthritis(714.0)     Past Surgical History:  Procedure Laterality Date  . DENTAL SURGERY    . OVARY SURGERY    . RIGHT HEART CATH N/A 01/18/2017   Procedure: RIGHT HEART CATH;  Surgeon: Laurey Morale, MD;  Location: [facility] INVASIVE CV LAB;  Service: Cardiovascular;  Laterality: N/A;    Allergies: Clarithromycin; Hydrocodone; Ondansetron; Penicillins; and Tdap [tetanus-diphth-acell pertussis]  Medications: Prior to Admission medications   Medication Sig Start Date End Date Taking? Authorizing Provider  Adalimumab (HUMIRA) 20 MG/0.2ML PSKT Inject into the skin 2 (two) times a week.   Yes [provider]  albuterol (PROAIR HFA) 108 (90 BASE) MCG/ACT inhaler Inhale 2 puffs into the lungs every 6 (six) hours as needed for wheezing or shortness of breath.    Yes [provider]  albuterol (PROVENTIL) (2.5 MG/3ML) 0.083% nebulizer solution Take 2.5 mg by nebulization every 6 (six) hours as needed for wheezing or shortness of breath.   Yes [provider]  clotrimazole (LOTRIMIN) 1 % cream Apply 1 application topically 2 (two) times daily. 03/13/18  Yes [provider]  ferrous gluconate (FERGON) 324 MG tablet Take 324 mg by mouth daily.   Yes [provider]  folic acid (FOLVITE) 1 MG tablet Take 1 mg by mouth daily.   Yes [provider]  furosemide (LASIX) 40 MG tablet Take 3 tablets (120mg ) in the mornings and 2 tablets (80mg ) in the evenings by mouth daily Patient taking differently: Take 120 mg by mouth every morning.  05/20/17  Yes , PA-C  hydroxychloroquine (PLAQUENIL) 200 MG tablet Take 400 mg by mouth daily.    Yes [provider]  isosorbide-hydrALAZINE (BIDIL) 20-37.5 MG tablet Take 2 tablets by mouth 3 (three) times daily. 05/20/17  Yes Graciella Freer, PA-C  levocetirizine (XYZAL) 5 MG tablet Take 5 mg by mouth at bedtime.    Yes [provider]  metFORMIN (GLUCOPHAGE) 500 MG tablet Take 1,000 mg by mouth 2 (two) times daily with a meal.    Yes [provider]  methocarbamol (ROBAXIN) 500 MG tablet Take 500 mg by mouth 2 (two) times daily as needed (for muscle spasms.).    Yes [provider]  Methotrexate, PF, 25 MG/0.5ML SOAJ Inject 0.7 mLs into the skin once a week. On Friday   Yes [provider]  metoprolol succinate (TOPROL-XL) 50 MG 24 hr tablet Take  1.5 tabs daily 01/26/18  Yes Laurey Morale, MD  omeprazole (PRILOSEC) 40 MG capsule Take 40 mg by mouth daily before breakfast.  09/29/16  Yes [provider]  sacubitril-valsartan (ENTRESTO) 97-103 MG Take 1 tablet by mouth 2 (two) times daily. 05/20/17  Yes Graciella Freer, PA-C  SYMBICORT 160-4.5 MCG/ACT inhaler Inhale 2 puffs into the lungs daily as  needed (for respiratory issues.).  06/01/13  Yes [provider]  traMADol (ULTRAM) 50 MG tablet Take 1 tablet (50 mg total) by mouth 4 (four) times daily. pain Patient taking differently: Take 50 mg by mouth 4 (four) times daily as needed (for pain.).  03/05/12  Yes Rai, Ripudeep K, MD  triamcinolone cream (KENALOG) 0.1 % Apply 1 application topically 2 (two) times daily as needed (for ezcema.).   Yes [provider]  valACYclovir (VALTREX) 500 MG tablet Take 500 mg by mouth 2 (two) times daily as needed (for cold sores.).   Yes [provider]  spironolactone (ALDACTONE) 25 MG tablet Take 1 tablet (25 mg total) by mouth daily. 05/20/17 01/26/18  Graciella Freer, PA-C     Family History  Problem Relation Age of Onset  . Heart attack Mother   . CAD Mother   . Allergies Mother   . Breast cancer Mother   . Hypertension Unknown   . Diabetes Mellitus II Unknown     Social History   Socioeconomic History  . Marital status: Single    Spouse name: Not on file  . Number of children: 0  . Years of education: Not on file  . Highest education level: Not on file  Occupational History  . Occupation: Disabled    Associate Professor: UNEMPLOYED  Social Needs  . Financial resource strain: Not on file  . Food insecurity:    Worry: Not on file    Inability: Not on file  . Transportation needs:    Medical: Not on file    Non-medical: Not on file  Tobacco Use  . Smoking status: Never Smoker  . Smokeless tobacco: Never Used  Substance and Sexual Activity  . Alcohol use: No  . Drug use: No  . Sexual activity: Not on file  Lifestyle  . Physical activity:    Days per week: Not on file    Minutes per session: Not on file  . Stress: Not on file  Relationships  . Social connections:    Talks on phone: Not on file    Gets together: Not on file    Attends religious service: Not on file    Active member of club or organization: Not on file    Attends meetings of clubs or  organizations: Not on file    Relationship status: Not on file  Other Topics Concern  . Not on file  Social History Narrative  . Not on file      Review of Systems currently denies fever, headache, chest pain, dyspnea, cough, nausea, vomiting or bleeding.  She continues to have abdominal and back discomfort.  Vital Signs: BP 116/63 (BP Location: Left Arm)   Pulse 100   Temp 99.7 F (37.6 C) (Oral)   Resp 20   LMP 02/22/2018 Comment: negative urine pregnancy test 03-14-2018  SpO2 100%   Physical Exam awake ,alert.  Chest clear to auscultation bilaterally.  Heart with slightly tachycardic but regular rhythm.  Abdomen obese, soft, +BS, diffusely tender but slightly more so in lower abdomen.  Intertriginous dermatitis of lower abdomen/ groin region;  ext with full range of motion  Imaging: Ct Abdomen Pelvis W Contrast  Result Date: 03/14/2018 CLINICAL DATA:  Lower abdominal pain, sepsis. EXAM: CT ABDOMEN AND PELVIS WITH CONTRAST TECHNIQUE: Multidetector CT imaging of the abdomen and pelvis was performed using the standard protocol following bolus administration of intravenous contrast. CONTRAST:  ISOVUE-300 IOPAMIDOL (ISOVUE-300) INJECTION 61% COMPARISON:  None. FINDINGS: Lower chest: No acute abnormality. Hepatobiliary: Cholelithiasis is noted. No biliary dilatation is noted. The liver appears grossly normal. Pancreas: Unremarkable. No pancreatic ductal dilatation or surrounding inflammatory changes. Spleen: Normal in size without focal abnormality. Adrenals/Urinary Tract: Adrenal glands are unremarkable. Kidneys are normal, without renal calculi, focal lesion, or hydronephrosis. Bladder is unremarkable. Stomach/Bowel: Stomach and appendix are unremarkable. Sigmoid diverticulitis is noted. This results in secondary inflammation of adjacent small bowel loops. Also noted is 5.2 x 4.4 cm fluid collection in pre rectal space concerning for abscess. Vascular/Lymphatic: No significant vascular  findings are present. No enlarged abdominal or pelvic lymph nodes. Reproductive: Uterus and bilateral adnexa are unremarkable. Other: No hernia is noted. Musculoskeletal: No acute or significant osseous findings. IMPRESSION: Sigmoid diverticulitis is noted resulting in secondary inflammation and thickening of adjacent small bowel loops. 5.2 x 4.4 cm probable abscess seen in prerectal space. Electronically Signed   By: Lupita Raider, M.D.   On: 03/14/2018 15:33    Labs:  CBC: Recent Labs    03/14/18 1319 03/15/18 0519  WBC 22.5* 24.1*  HGB 8.7* 8.6*  HCT 28.5* 28.8*  PLT 316 286    COAGS: Recent Labs    03/15/18 0519  INR 0.99    BMP: Recent Labs    07/08/17 0939 01/26/18 1154 03/14/18 1319 03/15/18 0519  NA 137 138 135 138  K 3.9 4.5 3.8 4.0  CL 103 102 105 108  CO2 24 27 20* 18*  GLUCOSE 112* 122* 140* 133*  BUN 15 29* 40* 27*  CALCIUM 8.7* 9.0 8.3* 8.4*  CREATININE 1.00 0.82 1.21* 0.96  GFRNONAA >60 >60 54* >60  GFRAA >60 >60 >60 >60    LIVER FUNCTION TESTS: Recent Labs    07/08/17 0939 03/14/18 1319 03/15/18 0519  BILITOT 1.1 0.7 0.9  AST 18 17 16   ALT 12* 17 16  ALKPHOS 99 76 72  PROT 8.8* 8.2* 7.9  ALBUMIN 3.9 3.5 3.3*    TUMOR MARKERS: No results for input(s): AFPTM, CEA, CA199, CHROMGRNA in the last 8760 hours.  Assessment and Plan: 47 y.o. female with history of asthma, nonischemic cardiomyopathy, diabetes, hypertension, and rheumatoid arthritis who presented to the [facility] on 12/[facility] as a transfer from primary care MD with abdominal/pelvic pain, nausea, vomiting, night sweats/chills, elevated WBC.  She recently completed a course of antibiotics for intertrigo/MASD of abdominal pannus and bilateral upper thigh folds.  Subsequent imaging revealed sigmoid diverticulitis with 5.2 cm abscess in the perirectal space. Request now received from surgery for image guided drainage of the abscess.  Imaging studies have been reviewed by Dr. Miles Costain  and abscess appears amenable to drainage via transgluteal approach.  Current labs include WBC 24.1, hemoglobin 8.6, platelets 286k, creatinine 0.96, PT/INR normal.Risks and benefits discussed with the patient including bleeding, infection, damage to adjacent structures, bowel perforation/fistula connection, and sepsis.  All of the patient's questions were answered, patient is agreeable to proceed. Consent signed and in chart.  Procedure scheduled for this morning.   Thank you for this interesting consult.  I greatly enjoyed meeting INFANT COTLER and look forward to participating  in their care.  A copy of this report was sent to the requesting provider on this date.  Electronically Signed: D. Jeananne Rama, PA-C 03/15/2018, 10:00 AM   I spent a total of 30 minutes    in face to face in clinical consultation, greater than 50% of which was counseling/coordinating care for CT-guided drainage of pelvic abscess

## 2018-03-15 NOTE — Progress Notes (Signed)
Patient ID: [name], female   DOB: 1970-06-22, 47 y.o.   MRN: [id number]       Subjective: Pt spiked fever to 102 last night.  C/o being hungry.  Pain is about the same, no better, no worse.  Ice gives her crampy pain.  Objective: Vital signs in last 24 hours: Temp:  [99 F (37.2 C)-102.1 F (38.9 C)] 99.7 F (37.6 C) (12/04 0429) Pulse Rate:  [96-112] 100 (12/04 0429) Resp:  [16-20] 20 (12/04 0429) BP: (113-145)/(58-78) 116/63 (12/04 0429) SpO2:  [99 %-100 %] 100 % (12/04 0429)    Intake/Output from previous day: 12/03 0701 - 12/04 0700 In: 616.4 [I.V.:365.3; IV Piggyback:251.1] Out: -  Intake/Output this shift: No intake/output data recorded.  PE: Heart: regular Lungs: CTAB Abd: soft, diffusely tender, greatest in her lower abdomen, +BS, obese  Lab Results:  Recent Labs    03/14/18 1319 03/15/18 0519  WBC 22.5* 24.1*  HGB 8.7* 8.6*  HCT 28.5* 28.8*  PLT 316 286   BMET Recent Labs    03/14/18 1319 03/15/18 0519  NA 135 138  K 3.8 4.0  CL 105 108  CO2 20* 18*  GLUCOSE 140* 133*  BUN 40* 27*  CREATININE 1.21* 0.96  CALCIUM 8.3* 8.4*   PT/INR Recent Labs    03/15/18 0519  LABPROT 13.0  INR 0.99   CMP     Component Value Date/Time   NA 138 03/15/2018 0519   NA 142 01/06/2017 1433   K 4.0 03/15/2018 0519   CL 108 03/15/2018 0519   CO2 18 (L) 03/15/2018 0519   GLUCOSE 133 (H) 03/15/2018 0519   BUN 27 (H) 03/15/2018 0519   BUN 16 01/06/2017 1433   CREATININE 0.96 03/15/2018 0519   CALCIUM 8.4 (L) 03/15/2018 0519   PROT 7.9 03/15/2018 0519   ALBUMIN 3.3 (L) 03/15/2018 0519   AST 16 03/15/2018 0519   ALT 16 03/15/2018 0519   ALKPHOS 72 03/15/2018 0519   BILITOT 0.9 03/15/2018 0519   GFRNONAA >60 03/15/2018 0519   GFRAA >60 03/15/2018 0519   Lipase     Component Value Date/Time   LIPASE 30 03/14/2018 1319       Studies/Results: Ct Abdomen Pelvis W Contrast  Result Date: 03/14/2018 CLINICAL DATA:  Lower abdominal pain,  sepsis. EXAM: CT ABDOMEN AND PELVIS WITH CONTRAST TECHNIQUE: Multidetector CT imaging of the abdomen and pelvis was performed using the standard protocol following bolus administration of intravenous contrast. CONTRAST:  ISOVUE-300 IOPAMIDOL (ISOVUE-300) INJECTION 61% COMPARISON:  None. FINDINGS: Lower chest: No acute abnormality. Hepatobiliary: Cholelithiasis is noted. No biliary dilatation is noted. The liver appears grossly normal. Pancreas: Unremarkable. No pancreatic ductal dilatation or surrounding inflammatory changes. Spleen: Normal in size without focal abnormality. Adrenals/Urinary Tract: Adrenal glands are unremarkable. Kidneys are normal, without renal calculi, focal lesion, or hydronephrosis. Bladder is unremarkable. Stomach/Bowel: Stomach and appendix are unremarkable. Sigmoid diverticulitis is noted. This results in secondary inflammation of adjacent small bowel loops. Also noted is 5.2 x 4.4 cm fluid collection in pre rectal space concerning for abscess. Vascular/Lymphatic: No significant vascular findings are present. No enlarged abdominal or pelvic lymph nodes. Reproductive: Uterus and bilateral adnexa are unremarkable. Other: No hernia is noted. Musculoskeletal: No acute or significant osseous findings. IMPRESSION: Sigmoid diverticulitis is noted resulting in secondary inflammation and thickening of adjacent small bowel loops. 5.2 x 4.4 cm probable abscess seen in prerectal space. Electronically Signed   By: Lupita Raider, M.D.   On: 03/14/2018  15:33    Anti-infectives: Anti-infectives (From admission, onward)   Start     Dose/Rate Route Frequency Ordered Stop   03/15/18 0400  ciprofloxacin (CIPRO) IVPB 400 mg     400 mg 200 mL/hr over 60 Minutes Intravenous Every 12 hours 03/14/18 1722     03/14/18 1830  metroNIDAZOLE (FLAGYL) IVPB 500 mg     500 mg 100 mL/hr over 60 Minutes Intravenous Every 8 hours 03/14/18 1722     03/14/18 1830  fluconazole (DIFLUCAN) IVPB 100 mg     100  mg 50 mL/hr over 60 Minutes Intravenous Every 24 hours 03/14/18 1742     03/14/18 1515  ciprofloxacin (CIPRO) IVPB 400 mg     400 mg 200 mL/hr over 60 Minutes Intravenous  Once 03/14/18 1512 03/14/18 1716   03/14/18 1515  metroNIDAZOLE (FLAGYL) IVPB 500 mg  Status:  Discontinued     500 mg 100 mL/hr over 60 Minutes Intravenous  Once 03/14/18 1512 03/14/18 1808       Assessment/Plan Rheumatoid arthritis, on Humira, Plaquenil, and methotrexate Nonischemic cardiomyopathy with an EF now up to 40-45% Hypertension  Acute kidney injury Hx diabetes mellitus -currently on no treatment for this. Obesity  Sigmoid diverticulitis with 5.4 x 4.4 cm pre-rectal abscess/fluid collection -IR to see today and hopefully plan for perc drain of this abscess pending their schedule. -cont abx therapy, as WBC increased to 24K today -cont NPO for now to allow for bowel rest.  Patient c/o hunger and she wants to eat.  Discussed why bowel rest is important in this situation especially given she doesn't want an operation or a colostomy. -hopefully she will improve with conservative measures.  Certainly given her currently infection as well as use of Humira, Plaquenil, and methotrexate, she will certainly get a colostomy if she needed an acute operation.  FEN - IVFs/NPO VTE - SCDs/ok for chemical prophylaxis when IR procedure completed. ID - Cipro/Flagyl 12/3 -->   LOS: 1 day    Letha Cape , [facility] 03/15/2018, 8:18 AM Pager: [phone]

## 2018-03-15 NOTE — Procedures (Signed)
Pelvic abscess  S/p CT drain left transgluteal  No comp Stable EBL min 13cc exudative fld aspirated cx sent Full report in pacs

## 2018-03-15 NOTE — Progress Notes (Signed)
PROGRESS NOTE    [name]  JQZ:[id number] DOB: 05/24/1970 DOA: 03/14/2018 PCP: Shirlean Mylar, MD  Brief Narrative:  HPI per Dr. Osvaldo Shipper on 03/14/18 [name] is a 47 y.o. female with a past medical history of chronic systolic CHF with ejection fraction of 40 to 45% based on echocardiogram from October, history of rheumatoid arthritis followed by rheumatology in [facility] on multiple immunosuppressants, history of diabetes mellitus type 2, history of asthma who developed yeast infection in her lower abdomen and in the groin area for which she has been taking antifungal tablet as well as an antifungal cream.  This has been ongoing for about 2 weeks.  She has had a lot of burning sensation in the lower part at the site of these lesions.  She tells me that with treatment the lesions have been improving.  And then about 3 days ago she started developing pain in the lower abdomen which is slightly different.  She had fever and chills last night although she did not check her temperature.  The pain was 8 out of 10 in intensity.  She had nausea and vomiting x1.  She had a loose stool yesterday.  She has had poor oral intake.  In the emergency department she underwent a CT scan which revealed sigmoid diverticulitis with fluid collection suspicious for abscess.  She was hospitalized for further management.  General surgery was consulted.  Urine pregnancy test was negative.  Last menstrual period was 12th of November.  **She is status post CT drain placement via transgluteal approach on the left side for her pelvic abscess.  Assessment & Plan:   Principal Problem:   Sigmoid diverticulitis Active Problems:   Rheumatoid arthritis (HCC)   Chronic systolic heart failure (HCC)   Intra-abdominal abscess (HCC)  Acute Sigmoid Diverticulitis with Abscess -CT of the abdomen pelvis with contrast showed sigmoid diverticulitis resulting in secondary inflammation and thickening of the adjacent small  bowel loops.  There is a 5.2 x 4.4 cm probable abscess in the perirectal space -General surgery has been consulted and they recommend broad-spectrum IV antibiotic and percutaneous drainage of abscess.  IR was consulted  and patient underwent percutaneous drain placement by Dr. Denny Levy -General surgery recommending continue IV antibiotic therapy continuing bowel rest and n.p.o. for now -Patient was placed on IV Ciprofloxacin and Metronidazole.  She is allergic to penicillin.   -Due to concern for yeast infection she will be placed on Diflucan as well IV.   -Her WBC is noted to be elevated from infection and WBC is now 24,100  and slightly up from 22,500 on admission -She is afebrile but has slightly elevated heart rate.  No clear sepsis is identified at this time.  Otherwise she is hemodynamically stable.   -Blood cultures x2 show NGTD at <12 hours -IR placed drain and aspirated 13 cc of fluid that was sent off for Analysis; Aspirate Cx pending  -Appreciate further general surgery recommendations.  Yeast Infection -She has skin lesions in the lower abdomen and in the groin area.   -This is been treated by her primary care provider with antifungal agents.  -C/w Topical Antifungal Agents with Clotrimazole 1 application topically BID -C/w Fluconazole 100 mg IV q24h  Chronic Systolic CHF -EF noted to be 40 to 45% based on last echocardiogram done in October.   -She is followed by the heart failure clinic.   -Her elevated BUN and creatinine suggest a degree of hypovolemia.  -She was gently hydrated  and now IVF has stopped. -Continue to hold her diuretics and her Entresto for now and likely resume in AM.  -C/w with Metoprolol Succinate 50 mg po Daily and Isosorbide-Hydralazine 20-37.5 mg po TID  -Strict I's and O's, Daily Weights -Patient is +616.4 mL since admission and Weight is not done  -If there is a change in her hemodynamics these medications may have to be stopped. Currently holding her  Spironolactone.   -May need to consult Cardiology if there is need for surgical intervention.  Mild Acute Renal Failure, improved  -BUN and creatinine noted to be higher than her usual baseline and was 40/1.21 on Admission.   -Given Gentle IVF hydration for 12 hours and now discontinued. -Continue to Hold her diuretics and her Entresto for now and avoid Nephrotoxic medications if possible -Monitor urine output. -BUN/Cr has now improved and is 27/0.96 -Repeat CMP in AM   Diabetes Mellitus Type 2 -Continue to Hold  Home Metformin for now.   -HbA1c was checked and 6.2 -CBGs have been ranging from 87-124 -Continue to Monitor CBG's closely   Rheumatoid Arthritis -Patient on multiple immunosuppressants.   -Has not been on steroids in the last 6 months.  She takes steroids only as needed for flareups.   -She is noted to be on methotrexate and hydroxychloroquine.   -She gets Humira injections but has not received them in a few weeks.   -We will hold all of her immunosuppressants for now given infection.  -She is followed by Rheumatology in [facility] and will need outpatient follow up at D/C.  Normocytic Anemia -Anemia panel done and showed an iron level of 10, U IBC of 255, TIBC of 265, saturation ratios of 4%, ferritin level 460, folate level 20.2, and vitamin B12 level 118 -Hemoglobin slightly lower than her baseline on admission. -Now Hb/Hct is 8.6/28.8 -Will not start IV iron in the setting of active infection -No evidence of overt bleeding currently and will continue to Monitor for S/Sx of Bleeding -Repeat CBC in AM   Morbid Obesity -Estimated body mass index is 42.89 kg/m as calculated from the following:   Height as of 03/08/17: 5\' 1"  (1.549 m).   Weight as of 01/26/18: 103 kg.  -Weight loss Counseling given   DVT prophylaxis: SCDs Code Status: FULL CODE Family Communication: No family present at bedside Disposition Plan: Pending improvement of Diverticulitis and  improvement of Abdominal Pain   Consultants:   General Surgery  Interventional Radiology   Procedures:   S/p CT Guided Left Transgluteal Drain placement with 13 cc of Fluid Aspirated    Antimicrobials:  Anti-infectives (From admission, onward)   Start     Dose/Rate Route Frequency Ordered Stop   03/15/18 0400  ciprofloxacin (CIPRO) IVPB 400 mg     400 mg 200 mL/hr over 60 Minutes Intravenous Every 12 hours 03/14/18 1722     03/14/18 1830  metroNIDAZOLE (FLAGYL) IVPB 500 mg     500 mg 100 mL/hr over 60 Minutes Intravenous Every 8 hours 03/14/18 1722     03/14/18 1830  fluconazole (DIFLUCAN) IVPB 100 mg     100 mg 50 mL/hr over 60 Minutes Intravenous Every 24 hours 03/14/18 1742     03/14/18 1515  ciprofloxacin (CIPRO) IVPB 400 mg     400 mg 200 mL/hr over 60 Minutes Intravenous  Once 03/14/18 1512 03/14/18 1716   03/14/18 1515  metroNIDAZOLE (FLAGYL) IVPB 500 mg  Status:  Discontinued     500 mg 100 mL/hr  over 60 Minutes Intravenous  Once 03/14/18 1512 03/14/18 1808     Subjective: Seen and Examined at bedside was continuing to have some abdominal pain but was hungry and wanting something to eat.  No chest pain, lightheadedness or dizziness.  Anticipating IR to evaluate her for CT-guided procedure.  No other concerns or complaints at this time  Objective: Vitals:   03/15/18 1140 03/15/18 1145 03/15/18 1200 03/15/18 1230  BP: (!) 105/56 (!) 107/58 (!) 106/51 (!) 100/49  Pulse: (!) 105 (!) 106  (!) 101  Resp: (!) 24 10 20 20   Temp:      TempSrc:      SpO2: 95% 96%      Intake/Output Summary (Last 24 hours) at 03/15/2018 1237 Last data filed at 03/15/2018 0500 Gross per 24 hour  Intake 616.39 ml  Output -  Net 616.39 ml   There were no vitals filed for this visit.  Examination: Physical Exam:  Constitutional: WN/WD morbidly obese AAF in NAD and appears some what uncomfortable Eyes: Lids and conjunctivae normal, sclerae anicteric  ENMT: External Ears, Nose appear  normal. Grossly normal hearing.  Neck: Appears normal, supple, no cervical masses, normal ROM, no appreciable thyromegaly; no appreciable JVD Respiratory: Diminished to auscultation bilaterally, no wheezing, rales, rhonchi or crackles. Normal respiratory effort and patient is not tachypenic. No accessory muscle use.  Cardiovascular: RRR, no murmurs / rubs / gallops. S1 and S2 auscultated. Trace extremity edema.  Abdomen: Soft, Tender to palpate diffusely, Distended 2/2 to body habitus. No masses palpated. No appreciable hepatosplenomegaly. Bowel sounds positive x4.  GU: Deferred. Musculoskeletal: No clubbing / cyanosis of digits/nails. Normal strength and muscle tone.  Skin: Has some erythematous lesions in the lower abdomen. No induration; Warm and dry.  Neurologic: CN 2-12 grossly intact with no focal deficits. Sensation intact in all 4 Extremities, DTR normal. Strength 5/5 in all 4. Romberg sign cerebellar reflexes not assessed.  Psychiatric: Normal judgment and insight. Alert and oriented x 3. Slightly anxious mood and appropriate affect.   Data Reviewed: I have personally reviewed following labs and imaging studies  CBC: Recent Labs  Lab 03/14/18 1319 03/15/18 0519  WBC 22.5* 24.1*  NEUTROABS 17.9*  --   HGB 8.7* 8.6*  HCT 28.5* 28.8*  MCV 97.3 98.6  PLT 316 286   Basic Metabolic Panel: Recent Labs  Lab 03/14/18 1319 03/15/18 0519  NA 135 138  K 3.8 4.0  CL 105 108  CO2 20* 18*  GLUCOSE 140* 133*  BUN 40* 27*  CREATININE 1.21* 0.96  CALCIUM 8.3* 8.4*   GFR: CrCl cannot be calculated (Unknown ideal weight.). Liver Function Tests: Recent Labs  Lab 03/14/18 1319 03/15/18 0519  AST 17 16  ALT 17 16  ALKPHOS 76 72  BILITOT 0.7 0.9  PROT 8.2* 7.9  ALBUMIN 3.5 3.3*   Recent Labs  Lab 03/14/18 1319  LIPASE 30   No results for input(s): AMMONIA in the last 168 hours. Coagulation Profile: Recent Labs  Lab 03/15/18 0519  INR 0.99   Cardiac Enzymes: No  results for input(s): CKTOTAL, CKMB, CKMBINDEX, TROPONINI in the last 168 hours. BNP (last 3 results) No results for input(s): PROBNP in the last 8760 hours. HbA1C: Recent Labs    03/15/18 0519  HGBA1C 6.2*   CBG: Recent Labs  Lab 03/14/18 1804 03/14/18 2022 03/15/18 0114 03/15/18 0424 03/15/18 0800  GLUCAP 87 108* 101* 124* 118*   Lipid Profile: No results for input(s): CHOL, HDL, LDLCALC, TRIG, CHOLHDL,  LDLDIRECT in the last 72 hours. Thyroid Function Tests: No results for input(s): TSH, T4TOTAL, FREET4, T3FREE, THYROIDAB in the last 72 hours. Anemia Panel: Recent Labs    03/15/18 0519  VITAMINB12 118*  FOLATE 20.2  FERRITIN 460*  TIBC 265  IRON 10*  RETICCTPCT 1.3   Sepsis Labs: No results for input(s): PROCALCITON, LATICACIDVEN in the last 168 hours.  Recent Results (from the past 240 hour(s))  Culture, blood (Routine X 2) w Reflex to ID Panel     Status: None (Preliminary result)   Collection Time: 03/14/18  7:12 PM  Result Value Ref Range Status   Specimen Description   Final    BLOOD RIGHT ARM Performed at [facility], [address], [place], [place] [address]    Special Requests   Final    BOTTLES DRAWN AEROBIC AND ANAEROBIC Blood Culture adequate volume Performed at [facility], [address], [place], [place] [address]    Culture   Final    NO GROWTH < 12 HOURS Performed at [facility], [address] [place], [place] [address]    Report Status PENDING  Incomplete  Culture, blood (Routine X 2) w Reflex to ID Panel     Status: None (Preliminary result)   Collection Time: 03/14/18  7:12 PM  Result Value Ref Range Status   Specimen Description   Final    BLOOD RIGHT ARM Performed at [facility], [address] [place], [place] [address]    Special Requests   Final    BOTTLES DRAWN AEROBIC AND ANAEROBIC Blood Culture adequate volume Performed at [facility],  [address], [place], [place] [address]    Culture   Final    NO GROWTH < 12 HOURS Performed at [facility], [address], [place], [place] [address]    Report Status PENDING  Incomplete  MRSA PCR Screening     Status: None   Collection Time: 03/15/18  6:36 AM  Result Value Ref Range Status   MRSA by PCR NEGATIVE NEGATIVE Final    Comment:        The GeneXpert MRSA Assay (FDA approved for NASAL specimens only), is one component of a comprehensive MRSA colonization surveillance program. It is not intended to diagnose MRSA infection nor to guide or monitor treatment for MRSA infections. Performed at [facility], [address] [place], [place] [address]        Radiology Studies: Ct Abdomen Pelvis W Contrast  Result Date: 03/14/2018 CLINICAL DATA:  Lower abdominal pain, sepsis. EXAM: CT ABDOMEN AND PELVIS WITH CONTRAST TECHNIQUE: Multidetector CT imaging of the abdomen and pelvis was performed using the standard protocol following bolus administration of intravenous contrast. CONTRAST:  ISOVUE-300 IOPAMIDOL (ISOVUE-300) INJECTION 61% COMPARISON:  None. FINDINGS: Lower chest: No acute abnormality. Hepatobiliary: Cholelithiasis is noted. No biliary dilatation is noted. The liver appears grossly normal. Pancreas: Unremarkable. No pancreatic ductal dilatation or surrounding inflammatory changes. Spleen: Normal in size without focal abnormality. Adrenals/Urinary Tract: Adrenal glands are unremarkable. Kidneys are normal, without renal calculi, focal lesion, or hydronephrosis. Bladder is unremarkable. Stomach/Bowel: Stomach and appendix are unremarkable. Sigmoid diverticulitis is noted. This results in secondary inflammation of adjacent small bowel loops. Also noted is 5.2 x 4.4 cm fluid collection in pre rectal space concerning for abscess. Vascular/Lymphatic: No significant vascular findings are present. No enlarged abdominal or pelvic lymph nodes.  Reproductive: Uterus and bilateral adnexa are  unremarkable. Other: No hernia is noted. Musculoskeletal: No acute or significant osseous findings. IMPRESSION: Sigmoid diverticulitis is noted resulting in secondary inflammation and thickening of adjacent small bowel loops. 5.2 x 4.4 cm probable abscess seen in prerectal space. Electronically Signed   By: Lupita Raider, M.D.   On: 03/14/2018 15:33   Scheduled Meds: . clotrimazole  1 application Topical BID  . flumazenil      . folic acid  1 mg Oral Daily  . insulin aspart  0-9 Units Subcutaneous Q4H  . isosorbide-hydrALAZINE  2 tablet Oral TID  . metoprolol succinate  50 mg Oral Daily  . mometasone-formoterol  2 puff Inhalation BID  . naloxone      . pantoprazole  40 mg Oral Daily  . sodium chloride flush  3 mL Intravenous Q12H  . sodium chloride flush  5 mL Intracatheter Q8H   Continuous Infusions: . sodium chloride    . ciprofloxacin 200 mL/hr at 03/15/18 0500  . fluconazole (DIFLUCAN) IV Stopped (03/14/18 2122)  . metronidazole 500 mg (03/15/18 0955)    LOS: 1 day   Merlene Laughter, DO [facility] PAGER is on AMION  If 7PM-7AM, please contact night-coverage [email or web address] Password [facility] 03/15/2018, 12:37 PM

## 2018-03-15 NOTE — Consult Note (Signed)
WOC Nurse wound consult note Patient receiving care in [facility].  No family present. Reason for Consult: MASD to abdominal pannus and bilateral upper thigh folds.  Patient states these areas have been present for 2 weeks and she has received a variety of treatment for them from her PCP. Wound type: Intertriginous Dermatitis, MASD None of the superficial, scattered, macerated areas look infected.  Plan of care: Fold Dermatherapy pillow cases (the silky pillow case with the grey lines in it) lengthwise.  Place the folded pillow cases in the abdominal pannus fold and in the upper thigh fold for each leg.  This is to absorb moisture. Monitor the wound area(s) for worsening of condition such as: Signs/symptoms of infection,  Increase in size,  Development of or worsening of odor, Development of pain, or increased pain at the affected locations.  Notify the medical team if any of these develop.  Thank you for the consult.  Discussed plan of care with the patient and bedside nurse.  WOC nurse will not follow at this time.  Please re-consult the WOC team if needed.  Helmut Muster, RN, MSN, CWOCN, CNS-BC, pager [phone]

## 2018-03-16 LAB — CBC WITH DIFFERENTIAL/PLATELET
Abs Immature Granulocytes: 0.11 10*3/uL — ABNORMAL HIGH (ref 0.00–0.07)
Basophils Absolute: 0 10*3/uL (ref 0.0–0.1)
Basophils Relative: 0 %
Eosinophils Absolute: 0.1 10*3/uL (ref 0.0–0.5)
Eosinophils Relative: 0 %
HCT: 25.8 % — ABNORMAL LOW (ref 36.0–46.0)
Hemoglobin: 7.8 g/dL — ABNORMAL LOW (ref 12.0–15.0)
IMMATURE GRANULOCYTES: 1 %
Lymphocytes Relative: 8 %
Lymphs Abs: 1.6 10*3/uL (ref 0.7–4.0)
MCH: 28.8 pg (ref 26.0–34.0)
MCHC: 30.2 g/dL (ref 30.0–36.0)
MCV: 95.2 fL (ref 80.0–100.0)
Monocytes Absolute: 2.6 10*3/uL — ABNORMAL HIGH (ref 0.1–1.0)
Monocytes Relative: 13 %
Neutro Abs: 15.2 10*3/uL — ABNORMAL HIGH (ref 1.7–7.7)
Neutrophils Relative %: 78 %
Platelets: 351 10*3/uL (ref 150–400)
RBC: 2.71 MIL/uL — ABNORMAL LOW (ref 3.87–5.11)
RDW: 17.3 % — ABNORMAL HIGH (ref 11.5–15.5)
WBC: 19.5 10*3/uL — ABNORMAL HIGH (ref 4.0–10.5)
nRBC: 0 % (ref 0.0–0.2)

## 2018-03-16 LAB — COMPREHENSIVE METABOLIC PANEL
ALT: 14 U/L (ref 0–44)
AST: 13 U/L — ABNORMAL LOW (ref 15–41)
Albumin: 3.1 g/dL — ABNORMAL LOW (ref 3.5–5.0)
Alkaline Phosphatase: 72 U/L (ref 38–126)
Anion gap: 8 (ref 5–15)
BUN: 28 mg/dL — AB (ref 6–20)
CO2: 19 mmol/L — ABNORMAL LOW (ref 22–32)
Calcium: 8.3 mg/dL — ABNORMAL LOW (ref 8.9–10.3)
Chloride: 109 mmol/L (ref 98–111)
Creatinine, Ser: 0.85 mg/dL (ref 0.44–1.00)
GFR calc Af Amer: >60 mL/min (ref >60)
GFR calc non Af Amer: >60 mL/min (ref >60)
Glucose, Bld: 144 mg/dL — ABNORMAL HIGH (ref 70–99)
Potassium: 4 mmol/L (ref 3.5–5.1)
Sodium: 136 mmol/L (ref 135–145)
Total Bilirubin: 1 mg/dL (ref 0.3–1.2)
Total Protein: 7.5 g/dL (ref 6.5–8.1)

## 2018-03-16 LAB — MAGNESIUM: Magnesium: 2.2 mg/dL (ref 1.7–2.4)

## 2018-03-16 LAB — GLUCOSE, CAPILLARY
GLUCOSE-CAPILLARY: 119 mg/dL — AB (ref 70–99)
GLUCOSE-CAPILLARY: 120 mg/dL — AB (ref 70–99)
Glucose-Capillary: 103 mg/dL — ABNORMAL HIGH (ref 70–99)
Glucose-Capillary: 120 mg/dL — ABNORMAL HIGH (ref 70–99)

## 2018-03-16 LAB — PHOSPHORUS: Phosphorus: 3 mg/dL (ref 2.5–4.6)

## 2018-03-16 MED ORDER — LIDOCAINE 5 % EX PTCH
1.0000 | MEDICATED_PATCH | CUTANEOUS | Status: DC
  Administered 2018-03-16: 1 via TRANSDERMAL
  Filled 2018-03-16 (×13): qty 1

## 2018-03-16 MED ORDER — DOCUSATE SODIUM 100 MG PO CAPS
100.0000 mg | ORAL_CAPSULE | Freq: Every day | ORAL | Status: DC
  Administered 2018-03-16 – 2018-03-28 (×13): 100 mg via ORAL
  Filled 2018-03-16 (×13): qty 1

## 2018-03-16 MED ORDER — SACUBITRIL-VALSARTAN 97-103 MG PO TABS
1.0000 | ORAL_TABLET | Freq: Two times a day (BID) | ORAL | Status: DC
  Administered 2018-03-16 – 2018-03-28 (×24): 1 via ORAL
  Filled 2018-03-16 (×27): qty 1

## 2018-03-16 MED ORDER — ENOXAPARIN SODIUM 40 MG/0.4ML ~~LOC~~ SOLN
40.0000 mg | SUBCUTANEOUS | Status: DC
  Administered 2018-03-16 – 2018-03-27 (×12): 40 mg via SUBCUTANEOUS
  Filled 2018-03-16 (×13): qty 0.4

## 2018-03-16 MED ORDER — DICLOFENAC SODIUM 1 % TD GEL
2.0000 g | Freq: Four times a day (QID) | TRANSDERMAL | Status: DC
  Administered 2018-03-16 – 2018-03-27 (×19): 2 g via TOPICAL
  Filled 2018-03-16: qty 100

## 2018-03-16 MED ORDER — MORPHINE SULFATE (PF) 2 MG/ML IV SOLN
2.0000 mg | INTRAVENOUS | Status: DC | PRN
  Administered 2018-03-16 – 2018-03-17 (×4): 2 mg via INTRAVENOUS
  Filled 2018-03-16 (×4): qty 1

## 2018-03-16 MED ORDER — HEPARIN SODIUM (PORCINE) 5000 UNIT/ML IJ SOLN: 5000.0000 [IU] | Freq: Three times a day (TID) | INTRAMUSCULAR | Status: DC

## 2018-03-16 NOTE — Progress Notes (Signed)
PROGRESS NOTE    NOHELANI BENNING  JOI:[id number] DOB: May 02, 1970 DOA: 03/14/2018 PCP: Shirlean Mylar, MD  Brief Narrative:  HPI per Dr. Osvaldo Shipper on 03/14/18 OMAH DEWALT is a 47 y.o. female with a past medical history of chronic systolic CHF with ejection fraction of 40 to 45% based on echocardiogram from October, history of rheumatoid arthritis followed by rheumatology in [facility] on multiple immunosuppressants, history of diabetes mellitus type 2, history of asthma who developed yeast infection in her lower abdomen and in the groin area for which she has been taking antifungal tablet as well as an antifungal cream.  This has been ongoing for about 2 weeks.  She has had a lot of burning sensation in the lower part at the site of these lesions.  She tells me that with treatment the lesions have been improving.  And then about 3 days ago she started developing pain in the lower abdomen which is slightly different.  She had fever and chills last night although she did not check her temperature.  The pain was 8 out of 10 in intensity.  She had nausea and vomiting x1.  She had a loose stool yesterday.  She has had poor oral intake.  In the emergency department she underwent a CT scan which revealed sigmoid diverticulitis with fluid collection suspicious for abscess.  She was hospitalized for further management.  General surgery was consulted.  Urine pregnancy test was negative.  Last menstrual period was 12th of November.  **She is status post CT drain placement via transgluteal approach on the left side for her pelvic abscess.  Her main complaint is now back pain as her abdominal pain is improved.  Assessment & Plan:   Principal Problem:   Sigmoid diverticulitis Active Problems:   Rheumatoid arthritis (HCC)   Chronic systolic heart failure (HCC)   Intra-abdominal abscess (HCC)  Acute Sigmoid Diverticulitis with Abscess status post percutaneous drain placement via transgluteal approach -CT  of the abdomen pelvis with contrast showed sigmoid diverticulitis resulting in secondary inflammation and thickening of the adjacent small bowel loops.  There is a 5.2 x 4.4 cm probable abscess in the perirectal space -General surgery has been consulted and they recommend broad-spectrum IV antibiotic and percutaneous drainage of abscess.  IR was consulted  and patient underwent percutaneous drain placement by Dr. Denny Levy on 03/15/18 -General surgery recommending continue IV antibiotic therapy continuing bowel rest and n.p.o. for now -Patient was placed on IV Ciprofloxacin and Metronidazole.  She is allergic to penicillin.   -Due to concern for yeast infection she will be placed on Diflucan as well IV.   -Her WBC is noted to be elevated from infection and WBC peaked to 24,100  and slightly up from 22,500 on admission; Now is trending down is 19,500 -She is afebrile but has slightly elevated heart rate.  No clear sepsis is identified at this time.  Otherwise she is hemodynamically stable.   -Blood cultures x2 show NGTD at 2 days -IR placed drain and aspirated 13 cc of fluid that was sent off for Analysis; Aspirate Gram stain showed few WBCs both PMNs of mononuclear with rare gram-positive cocci in pairs and chains.  Culture showed no growth less than 24 hours; drain is now putting out clearish brown fluid.  IR recommending continued every shift flushing and monitoring of output -Appreciate further general surgery recommendations. -Pain control with IV morphine 2 mg to 3 PRN for severe pain along with methocarbamol 500 mg p.o. twice  daily PRN for muscle spasm  Back Pain -Likely in the setting of where her drain is placed -Added Lidoderm patch and diclofenac -Continue with IV morphine but increased frequency from 2 mg every 4 hours as needed severe pain to every 3 hours as needed -Continue with methocarbamol 500 mg p.o. twice daily PRN for muscle spasms  Yeast Infection -She has skin lesions in the  lower abdomen and in the groin area.   -This is been treated by her primary care provider with antifungal agents.  -C/w Topical Antifungal Agents with Clotrimazole 1 application topically BID -C/w Fluconazole 100 mg IV q24h  Chronic Systolic CHF -EF noted to be 40 to 45% based on last echocardiogram done in October.   -She is followed by the heart failure clinic.   -Her elevated BUN and creatinine suggest a degree of hypovolemia.  -She was gently hydrated and now IVF has stopped. -Continue to hold her diuretics for now  -Resumed Entresto today as BP is stable but on the lower side at 111/60 -C/w with Metoprolol Succinate 50 mg po Daily and Isosorbide-Hydralazine 20-37.5 mg po TID  -Strict I's and O's, Daily Weights -Patient is +1,826 mL since admission and Weight done today was 233 lbs  -If there is a change in her hemodynamics these medications may have to be stopped. Currently holding her Spironolactone.   -May need to consult Cardiology if there is need for surgical intervention.  Mild Acute Renal Failure, improved  -BUN and creatinine noted to be higher than her usual baseline and was 40/1.21 on Admission.   -Given Gentle IVF hydration for 12 hours and now discontinued. -Continue to Hold her diuretics now and avoid Nephrotoxic medications if possible; Resumed Home Entresto today  -Monitor urine output. -BUN/Cr has now improved and is 28/0.85 -Repeat CMP in AM   Diabetes Mellitus Type 2 -Continue to Hold  Home Metformin for now.   -HbA1c was checked and 6.2 -CBGs have been ranging from 93-120 -Continue to Monitor CBG's closely   Rheumatoid Arthritis -Patient on multiple immunosuppressants.   -Has not been on steroids in the last 6 months.  She takes steroids only as needed for flareups.   -She is noted to be on Methotrexate and Hydroxychloroquine.   -She gets Humira injections but has not received them in a few weeks.   -We will hold all of her Immunosuppressants for now  given infection.  -She is followed by Rheumatology in [facility] and will need outpatient follow up at D/C.  Normocytic Anemia -Anemia panel done and showed an iron level of 10, U IBC of 255, TIBC of 265, saturation ratios of 4%, ferritin level 460, folate level 20.2, and vitamin B12 level 118 -Hemoglobin lower than her baseline on admission. -Now Hb/Hct is 7.8/25.8 -Will not start IV iron in the setting of active infection -No evidence of overt bleeding currently and will continue to Monitor for S/Sx of Bleeding -Repeat CBC in AM   Morbid Obesity -Estimated body mass index is 44.15 kg/m as calculated from the following:   Height as of this encounter: 5\' 1"  (1.549 m).   Weight as of this encounter: 106 kg.  -Weight loss Counseling given   DVT prophylaxis: SCDs Code Status: FULL CODE Family Communication: No family present at bedside Disposition Plan: Pending improvement of Diverticulitis and improvement of Abdominal Pain   Consultants:   General Surgery  Interventional Radiology   Procedures:   S/p CT Guided Left Transgluteal Drain placement with 13 cc of  Fluid Aspirated    Antimicrobials:  Anti-infectives (From admission, onward)   Start     Dose/Rate Route Frequency Ordered Stop   03/15/18 0400  ciprofloxacin (CIPRO) IVPB 400 mg     400 mg 200 mL/hr over 60 Minutes Intravenous Every 12 hours 03/14/18 1722     03/14/18 1830  metroNIDAZOLE (FLAGYL) IVPB 500 mg     500 mg 100 mL/hr over 60 Minutes Intravenous Every 8 hours 03/14/18 1722     03/14/18 1830  fluconazole (DIFLUCAN) IVPB 100 mg     100 mg 50 mL/hr over 60 Minutes Intravenous Every 24 hours 03/14/18 1742     03/14/18 1515  ciprofloxacin (CIPRO) IVPB 400 mg     400 mg 200 mL/hr over 60 Minutes Intravenous  Once 03/14/18 1512 03/14/18 1716   03/14/18 1515  metroNIDAZOLE (FLAGYL) IVPB 500 mg  Status:  Discontinued     500 mg 100 mL/hr over 60 Minutes Intravenous  Once 03/14/18 1512 03/14/18 1808      Subjective: Seen and Examined at bedside that her abdominal pain had improved but she was not having back pain no significant.  States is hurting her the drain is placed.  No chest pain, lightheadedness or dizziness.  No nausea or vomiting.  Was afraid to eat a clear liquid diet as she was afraid of having more abdominal pain.  No other concerns or plans at this time.  Objective: Vitals:   03/16/18 0105 03/16/18 0554 03/16/18 0948 03/16/18 1434  BP: (!) 109/50 107/71 (!) 103/58 111/60  Pulse: (!) 101 100 100 (!) 108  Resp: 16 16  20   Temp: 99.8 F (37.7 C) 99.8 F (37.7 C)  98.3 F (36.8 C)  TempSrc: Oral Oral  Oral  SpO2:  97%  100%  Weight: 106 kg     Height: 5\' 1"  (1.549 m)       Intake/Output Summary (Last 24 hours) at 03/16/2018 1607 Last data filed at 03/16/2018 0421 Gross per 24 hour  Intake 1235.18 ml  Output 55 ml  Net 1180.18 ml   Filed Weights   03/16/18 0105  Weight: 106 kg   Examination: Physical Exam:  Constitutional: Well-developed morbidly obese African American female currently in no acute distress appears somewhat uncomfortable sitting up in chair bedside Eyes: Conjunctive are normal.  Sclera anicteric ENMT: External ears and nose appear normal.  Grossly normal hearing Neck: Appears supple with no JVD Respiratory: Diminished to auscultation bilaterally no appreciable wheezing, rales, rhonchi.  Patient not tachypneic or using any accessory muscles to breathe Cardiovascular: Tachycardic rate but regular rhythm.  Has mild lower extremity edema Abdomen: Soft, mildly tender to palpate.  Distended secondary body habitus.  Bowel sounds present GU: Deferred Musculoskeletal: No appreciable clubbing or cyanosis.  No joint deformities noted Skin: Skin is warm and dry.  Has some erythematous lesions in the lower abdomen which is slightly improved; has a JP drain coming out of her backside on the left Neurologic: Cranial nerves II through XII grossly intact no  appreciable focal deficits. Psychiatric: Normal judgment and insight.  Patient is awake and alert and oriented x3.  Still remains somewhat anxious but has an appropriate affect  Data Reviewed: I have personally reviewed following labs and imaging studies  CBC: Recent Labs  Lab 03/14/18 1319 03/15/18 0519 03/16/18 0629  WBC 22.5* 24.1* 19.5*  NEUTROABS 17.9*  --  15.2*  HGB 8.7* 8.6* 7.8*  HCT 28.5* 28.8* 25.8*  MCV 97.3 98.6 95.2  PLT 316  286 351   Basic Metabolic Panel: Recent Labs  Lab 03/14/18 1319 03/15/18 0519 03/16/18 0629  NA 135 138 136  K 3.8 4.0 4.0  CL 105 108 109  CO2 20* 18* 19*  GLUCOSE 140* 133* 144*  BUN 40* 27* 28*  CREATININE 1.21* 0.96 0.85  CALCIUM 8.3* 8.4* 8.3*  MG  --   --  2.2  PHOS  --   --  3.0   GFR: Estimated Creatinine Clearance: 93.8 mL/min (by C-G formula based on SCr of 0.85 mg/dL). Liver Function Tests: Recent Labs  Lab 03/14/18 1319 03/15/18 0519 03/16/18 0629  AST 17 16 13*  ALT 17 16 14   ALKPHOS 76 72 72  BILITOT 0.7 0.9 1.0  PROT 8.2* 7.9 7.5  ALBUMIN 3.5 3.3* 3.1*   Recent Labs  Lab 03/14/18 1319  LIPASE 30   No results for input(s): AMMONIA in the last 168 hours. Coagulation Profile: Recent Labs  Lab 03/15/18 0519  INR 0.99   Cardiac Enzymes: No results for input(s): CKTOTAL, CKMB, CKMBINDEX, TROPONINI in the last 168 hours. BNP (last 3 results) No results for input(s): PROBNP in the last 8760 hours. HbA1C: Recent Labs    03/15/18 0519  HGBA1C 6.2*   CBG: Recent Labs  Lab 03/15/18 1612 03/15/18 2024 03/16/18 0426 03/16/18 0744 03/16/18 1224  GLUCAP 101* 99 119* 103* 120*   Lipid Profile: No results for input(s): CHOL, HDL, LDLCALC, TRIG, CHOLHDL, LDLDIRECT in the last 72 hours. Thyroid Function Tests: No results for input(s): TSH, T4TOTAL, FREET4, T3FREE, THYROIDAB in the last 72 hours. Anemia Panel: Recent Labs    03/15/18 0519  VITAMINB12 118*  FOLATE 20.2  FERRITIN 460*  TIBC 265    IRON 10*  RETICCTPCT 1.3   Sepsis Labs: No results for input(s): PROCALCITON, LATICACIDVEN in the last 168 hours.  Recent Results (from the past 240 hour(s))  Culture, blood (Routine X 2) w Reflex to ID Panel     Status: None (Preliminary result)   Collection Time: 03/14/18  7:12 PM  Result Value Ref Range Status   Specimen Description   Final    BLOOD RIGHT ARM Performed at [facility], [address], [place], [place] [address]    Special Requests   Final    BOTTLES DRAWN AEROBIC AND ANAEROBIC Blood Culture adequate volume Performed at [facility], [address] [place], [place] [address]    Culture   Final    NO GROWTH 2 DAYS Performed at [facility], [address], [place], [place] [address]    Report Status PENDING  Incomplete  Culture, blood (Routine X 2) w Reflex to ID Panel     Status: None (Preliminary result)   Collection Time: 03/14/18  7:12 PM  Result Value Ref Range Status   Specimen Description   Final    BLOOD RIGHT ARM Performed at [facility], [address], [place], [place] [address]    Special Requests   Final    BOTTLES DRAWN AEROBIC AND ANAEROBIC Blood Culture adequate volume Performed at [facility], [address], [place], [place] [address]    Culture   Final    NO GROWTH 2 DAYS Performed at [facility], [address] [place], [place] [address]    Report Status PENDING  Incomplete  MRSA PCR Screening     Status: None   Collection Time: 03/15/18  6:36 AM  Result Value Ref Range  Status   MRSA by PCR NEGATIVE NEGATIVE Final    Comment:        The GeneXpert MRSA Assay (FDA approved for NASAL specimens only), is one component of a comprehensive MRSA colonization surveillance program. It is not intended to diagnose MRSA infection nor to guide or monitor treatment for MRSA infections. Performed at [facility], [address], [place], [place] [address]   Aerobic/Anaerobic Culture (surgical/deep wound)     Status: None (Preliminary result)   Collection Time: 03/15/18 11:51 AM  Result Value Ref Range Status   Specimen Description   Final    ABSCESS LEFT BUTTOCKS Performed at [facility], [address] [place], [place] [address]    Special Requests   Final    Normal Performed at [facility], [address], [place], [place] [address]    Gram Stain   Final    FEW WBC PRESENT,BOTH PMN AND MONONUCLEAR RARE GRAM POSITIVE COCCI IN PAIRS AND CHAINS    Culture   Final    NO GROWTH < 24 HOURS Performed at [facility], [address], [place], [place] [address]    Report Status PENDING  Incomplete       Radiology Studies: Ct Image Guided Drainage By Percutaneous Catheter  Result Date: 03/15/2018 INDICATION: Sigmoid diverticulitis, posterior pelvic fluid collection EXAM: CT DRAINAGE PELVIC FLUID COLLECTION MEDICATIONS: The patient is currently admitted to the hospital and receiving intravenous antibiotics. The antibiotics were administered within an appropriate time frame prior to the initiation of the procedure. ANESTHESIA/SEDATION: Fentanyl 100 mcg IV; Versed 3 mg IV Moderate Sedation Time:  16 MINUTES The patient was continuously monitored during the procedure by the interventional radiology nurse under my direct supervision. COMPLICATIONS: None immediate. PROCEDURE: Informed written consent was obtained from the patient after a thorough discussion of the procedural risks, benefits and alternatives. All questions were addressed. Maximal Sterile Barrier Technique was utilized including caps, mask, sterile gowns, sterile gloves, sterile drape, hand hygiene and skin antiseptic. A timeout was performed prior to the initiation of the procedure. previous imaging reviewed. patient position prone. noncontrast localization ct performed. the posterior pelvic fluid collection was  localized. overlying skin marked for a left trans gluteal approach. under sterile conditions and local anesthesia, a 18 gauge 15 cm access was advanced from a left trans gluteal approach to the fluid collection. needle position confirmed with ct. syringe aspiration yielded mildly exudative fluid. sample sent for culture. guidewire inserted followed by tract dilatation to insert a 10 french drain. drain catheter position confirmed with ct. syringe aspiration yielded approximately 15 cc of mildly exudative fluid. this completely collapsed cavity. Catheter secured with a Prolene suture and connected to external suction bulb. Sterile dressing applied. No immediate complication. Patient tolerated the procedure well. IMPRESSION: Successful CT-guided left trans gluteal pelvic abscess drain insertion. Electronically Signed   By: Judie Petit.  Shick M.D.   On: 03/15/2018 13:22   Scheduled Meds: . cetirizine  10 mg Oral QHS  . clotrimazole  1 application Topical BID  . docusate sodium  100 mg Oral Daily  . enoxaparin (LOVENOX) injection  40 mg Subcutaneous Q24H  . folic acid  1 mg Oral Daily  . insulin aspart  0-9 Units Subcutaneous Q4H  . isosorbide-hydrALAZINE  2 tablet Oral TID  . lidocaine  1 patch Transdermal Q24H  . metoprolol succinate  50 mg Oral Daily  . mometasone-formoterol  2 puff Inhalation BID  . pantoprazole  40  mg Oral Daily  . sodium chloride flush  3 mL Intravenous Q12H  . sodium chloride flush  5 mL Intracatheter Q8H   Continuous Infusions: . sodium chloride    . ciprofloxacin 400 mg (03/16/18 1527)  . fluconazole (DIFLUCAN) IV Stopped (03/15/18 2251)  . metronidazole 500 mg (03/16/18 1000)    LOS: 2 days   Merlene Laughter, DO [facility] PAGER is on AMION  If 7PM-7AM, please contact night-coverage [email or web address] Password [email or web address] 03/16/2018, 4:07 PM

## 2018-03-16 NOTE — Progress Notes (Signed)
Referring Physician(s): Sherrie George  Supervising Physician: Ruel Favors  Patient Status:  [facility] - In-pt  Chief Complaint: Abdominal/pelvic pain  Subjective:  Pelvic/diverticular abscess s/p TG drain placement 03/15/2018 by Dr. Miles Costain. Patient awake and alert laying in bed. Complains of abdominal/pelvic pain. States it is difficult for her to get comfortable due to the "nagging" pain. TG drain incision c/d/i.   Allergies: Clarithromycin; Hydrocodone; Ondansetron; Penicillins; and Tdap [tetanus-diphth-acell pertussis]  Medications: Prior to Admission medications   Medication Sig Start Date End Date Taking? Authorizing Provider  Adalimumab (HUMIRA) 20 MG/0.2ML PSKT Inject into the skin 2 (two) times a week.   Yes [provider]  albuterol (PROAIR HFA) 108 (90 BASE) MCG/ACT inhaler Inhale 2 puffs into the lungs every 6 (six) hours as needed for wheezing or shortness of breath.   Yes [provider]  albuterol (PROVENTIL) (2.5 MG/3ML) 0.083% nebulizer solution Take 2.5 mg by nebulization every 6 (six) hours as needed for wheezing or shortness of breath.   Yes [provider]  clotrimazole (LOTRIMIN) 1 % cream Apply 1 application topically 2 (two) times daily. 03/13/18  Yes [provider]  ferrous gluconate (FERGON) 324 MG tablet Take 324 mg by mouth daily.   Yes [provider]  folic acid (FOLVITE) 1 MG tablet Take 1 mg by mouth daily.   Yes [provider]  furosemide (LASIX) 40 MG tablet Take 3 tablets (120mg ) in the mornings and 2 tablets (80mg ) in the evenings by mouth daily Patient taking differently: Take 120 mg by mouth every morning.  05/20/17  Yes Graciella Freer, PA-C  hydroxychloroquine (PLAQUENIL) 200 MG tablet Take 400 mg by mouth daily.    Yes [provider]  isosorbide-hydrALAZINE (BIDIL) 20-37.5 MG tablet Take 2 tablets by mouth 3 (three) times daily. 05/20/17  Yes Graciella Freer, PA-C    levocetirizine (XYZAL) 5 MG tablet Take 5 mg by mouth at bedtime.    Yes [provider]  metFORMIN (GLUCOPHAGE) 500 MG tablet Take 1,000 mg by mouth 2 (two) times daily with a meal.    Yes [provider]  methocarbamol (ROBAXIN) 500 MG tablet Take 500 mg by mouth 2 (two) times daily as needed (for muscle spasms.).    Yes [provider]  Methotrexate, PF, 25 MG/0.5ML SOAJ Inject 0.7 mLs into the skin once a week. On Friday   Yes [provider]  metoprolol succinate (TOPROL-XL) 50 MG 24 hr tablet Take 1.5 tabs daily 01/26/18  Yes Laurey Morale, MD  omeprazole (PRILOSEC) 40 MG capsule Take 40 mg by mouth daily before breakfast.  09/29/16  Yes [provider]  sacubitril-valsartan (ENTRESTO) 97-103 MG Take 1 tablet by mouth 2 (two) times daily. 05/20/17  Yes Graciella Freer, PA-C  SYMBICORT 160-4.5 MCG/ACT inhaler Inhale 2 puffs into the lungs daily as needed (for respiratory issues.).  06/01/13  Yes [provider]  traMADol (ULTRAM) 50 MG tablet Take 1 tablet (50 mg total) by mouth 4 (four) times daily. pain Patient taking differently: Take 50 mg by mouth 4 (four) times daily as needed (for pain.).  03/05/12  Yes Rai, Ripudeep K, MD  triamcinolone cream (KENALOG) 0.1 % Apply 1 application topically 2 (two) times daily as needed (for ezcema.).   Yes [provider]  valACYclovir (VALTREX) 500 MG tablet Take 500 mg by mouth 2 (two) times daily as needed (for cold sores.).   Yes [provider]  spironolactone (ALDACTONE) 25 MG tablet  Take 1 tablet (25 mg total) by mouth daily. 05/20/17 01/26/18  Graciella Freer, PA-C     Vital Signs: BP (!) 103/58   Pulse 100   Temp 99.8 F (37.7 C) (Oral)   Resp 16   Ht 5\' 1"  (1.549 m)   Wt 233 lb 11 oz (106 kg)   LMP 02/22/2018 Comment: negative urine pregnancy test 03-14-2018  SpO2 97%   BMI 44.15 kg/m   Physical Exam  Constitutional: She is oriented to person,  place, and time. She appears well-developed and well-nourished. No distress.  Pulmonary/Chest: Effort normal. No respiratory distress.  Abdominal:  TG drain incision without erythema, drainage, or tenderness; approximately 25 mL clear, light brown fluid in JP drain; drain flushes without resistance but has resistance with aspiration.  Neurological: She is alert and oriented to person, place, and time.  Skin: Skin is warm and dry.  Psychiatric: She has a normal mood and affect. Her behavior is normal. Judgment and thought content normal.  Nursing note and vitals reviewed.   Imaging: Ct Abdomen Pelvis W Contrast  Result Date: 03/14/2018 CLINICAL DATA:  Lower abdominal pain, sepsis. EXAM: CT ABDOMEN AND PELVIS WITH CONTRAST TECHNIQUE: Multidetector CT imaging of the abdomen and pelvis was performed using the standard protocol following bolus administration of intravenous contrast. CONTRAST:  14/06/2017 ISOVUE-300 IOPAMIDOL (ISOVUE-300) INJECTION 61% COMPARISON:  None. FINDINGS: Lower chest: No acute abnormality. Hepatobiliary: Cholelithiasis is noted. No biliary dilatation is noted. The liver appears grossly normal. Pancreas: Unremarkable. No pancreatic ductal dilatation or surrounding inflammatory changes. Spleen: Normal in size without focal abnormality. Adrenals/Urinary Tract: Adrenal glands are unremarkable. Kidneys are normal, without renal calculi, focal lesion, or hydronephrosis. Bladder is unremarkable. Stomach/Bowel: Stomach and appendix are unremarkable. Sigmoid diverticulitis is noted. This results in secondary inflammation of adjacent small bowel loops. Also noted is 5.2 x 4.4 cm fluid collection in pre rectal space concerning for abscess. Vascular/Lymphatic: No significant vascular findings are present. No enlarged abdominal or pelvic lymph nodes. Reproductive: Uterus and bilateral adnexa are unremarkable. Other: No hernia is noted. Musculoskeletal: No acute or significant osseous findings.  IMPRESSION: Sigmoid diverticulitis is noted resulting in secondary inflammation and thickening of adjacent small bowel loops. 5.2 x 4.4 cm probable abscess seen in prerectal space. Electronically Signed   By: , M.D.   On: 03/14/2018 15:33   Ct Image Guided Drainage By Percutaneous Catheter  Result Date: 03/15/2018 INDICATION: Sigmoid diverticulitis, posterior pelvic fluid collection EXAM: CT DRAINAGE PELVIC FLUID COLLECTION MEDICATIONS: The patient is currently admitted to the hospital and receiving intravenous antibiotics. The antibiotics were administered within an appropriate time frame prior to the initiation of the procedure. ANESTHESIA/SEDATION: Fentanyl 100 mcg IV; Versed 3 mg IV Moderate Sedation Time:  16 MINUTES The patient was continuously monitored during the procedure by the interventional radiology nurse under my direct supervision. COMPLICATIONS: None immediate. PROCEDURE: Informed written consent was obtained from the patient after a thorough discussion of the procedural risks, benefits and alternatives. All questions were addressed. Maximal Sterile Barrier Technique was utilized including caps, mask, sterile gowns, sterile gloves, sterile drape, hand hygiene and skin antiseptic. A timeout was performed prior to the initiation of the procedure. previous imaging reviewed. patient position prone. noncontrast localization ct performed. the posterior pelvic fluid collection was localized. overlying skin marked for a left trans gluteal approach. under sterile conditions and local anesthesia, a 18 gauge 15 cm access was advanced from a left trans gluteal approach to the fluid collection. needle position confirmed  with ct. syringe aspiration yielded mildly exudative fluid. sample sent for culture. guidewire inserted followed by tract dilatation to insert a 10 french drain. drain catheter position confirmed with ct. syringe aspiration yielded approximately 15 cc of mildly exudative fluid.  this completely collapsed cavity. Catheter secured with a Prolene suture and connected to external suction bulb. Sterile dressing applied. No immediate complication. Patient tolerated the procedure well. IMPRESSION: Successful CT-guided left trans gluteal pelvic abscess drain insertion. Electronically Signed   By: Judie Petit.  Shick M.D.   On: 03/15/2018 13:22    Labs:  CBC: Recent Labs    03/14/18 1319 03/15/18 0519 03/16/18 0629  WBC 22.5* 24.1* 19.5*  HGB 8.7* 8.6* 7.8*  HCT 28.5* 28.8* 25.8*  PLT 316 286 351    COAGS: Recent Labs    03/15/18 0519  INR 0.99    BMP: Recent Labs    01/26/18 1154 03/14/18 1319 03/15/18 0519 03/16/18 0629  NA 138 135 138 136  K 4.5 3.8 4.0 4.0  CL 102 105 108 109  CO2 27 20* 18* 19*  GLUCOSE 122* 140* 133* 144*  BUN 29* 40* 27* 28*  CALCIUM 9.0 8.3* 8.4* 8.3*  CREATININE 0.82 1.21* 0.96 0.85  GFRNONAA >60 54* >60 >60  GFRAA >60 >60 >60 >60    LIVER FUNCTION TESTS: Recent Labs    07/08/17 0939 03/14/18 1319 03/15/18 0519 03/16/18 0629  BILITOT 1.1 0.7 0.9 1.0  AST 18 17 16  13*  ALT 12* 17 16 14   ALKPHOS 99 76 72 72  PROT 8.8* 8.2* 7.9 7.5  ALBUMIN 3.9 3.5 3.3* 3.1*    Assessment and Plan:  Pelvic/diverticular abscess s/p TG drain placement 03/15/2018 by Dr. . TG drain intact/stable with approximately 25 mL clear light brown fluid in JP drain. Continue with Qshift flushing and monitoring of output. Appreciate and agree with [facility] management. IR to follow.   Electronically Signed: 14/07/2017, PA-C 03/16/2018, 11:03 AM   I spent a total of 25 Minutes at the the patient's bedside AND on the patient's hospital floor or unit, greater than 50% of which was counseling/coordinating care for pelvic/diverticular abscess s/p TG drain placement.

## 2018-03-16 NOTE — Progress Notes (Signed)
Patient ID: [name], female   DOB: [birth year], 47 y.o.   MRN: [id number]       Subjective: Pt sitting up in chair and c/o pain on her left buttock from drain placement.  No nausea.  Wants a stool softener.  Abdominal pain is somewhat improved after drain placement.  Objective: Vital signs in last 24 hours: Temp:  [98.3 F (36.8 C)-100.4 F (38 C)] 99.8 F (37.7 C) (12/05 0554) Pulse Rate:  [97-112] 100 (12/05 0554) Resp:  [10-24] 16 (12/05 0554) BP: (98-116)/(49-71) 107/71 (12/05 0554) SpO2:  [95 %-98 %] 97 % (12/05 0554) Weight:  [106 kg] 106 kg (12/05 0105) Last BM Date: 03/14/18  Intake/Output from previous day: 12/04 0701 - 12/05 0700 In: 1265.2 [P.O.:30; I.V.:384.3; IV Piggyback:830.9] Out: 55 [Drains:55] Intake/Output this shift: No intake/output data recorded.  PE: Heart: regular Lungs: CTAB Abd: soft, obese, tender on the left side of her abdomen in LUQ and LLQ.  No pain elsewhere today.  Some BS, TG drain on left side with serous output.  Lab Results:  Recent Labs    03/15/18 0519 03/16/18 0629  WBC 24.1* 19.5*  HGB 8.6* 7.8*  HCT 28.8* 25.8*  PLT 286 351   BMET Recent Labs    03/15/18 0519 03/16/18 0629  NA 138 136  K 4.0 4.0  CL 108 109  CO2 18* 19*  GLUCOSE 133* 144*  BUN 27* 28*  CREATININE 0.96 0.85  CALCIUM 8.4* 8.3*   PT/INR Recent Labs    03/15/18 0519  LABPROT 13.0  INR 0.99   CMP     Component Value Date/Time   NA 136 03/16/2018 0629   NA 142 01/06/2017 1433   K 4.0 03/16/2018 0629   CL 109 03/16/2018 0629   CO2 19 (L) 03/16/2018 0629   GLUCOSE 144 (H) 03/16/2018 0629   BUN 28 (H) 03/16/2018 0629   BUN 16 01/06/2017 1433   CREATININE 0.85 03/16/2018 0629   CALCIUM 8.3 (L) 03/16/2018 0629   PROT 7.5 03/16/2018 0629   ALBUMIN 3.1 (L) 03/16/2018 0629   AST 13 (L) 03/16/2018 0629   ALT 14 03/16/2018 0629   ALKPHOS 72 03/16/2018 0629   BILITOT 1.0 03/16/2018 0629   GFRNONAA >60 03/16/2018 0629   GFRAA >60  03/16/2018 0629   Lipase     Component Value Date/Time   LIPASE 30 03/14/2018 1319       Studies/Results: Ct Abdomen Pelvis W Contrast  Result Date: 03/14/2018 CLINICAL DATA:  Lower abdominal pain, sepsis. EXAM: CT ABDOMEN AND PELVIS WITH CONTRAST TECHNIQUE: Multidetector CT imaging of the abdomen and pelvis was performed using the standard protocol following bolus administration of intravenous contrast. CONTRAST:  ISOVUE-300 IOPAMIDOL (ISOVUE-300) INJECTION 61% COMPARISON:  None. FINDINGS: Lower chest: No acute abnormality. Hepatobiliary: Cholelithiasis is noted. No biliary dilatation is noted. The liver appears grossly normal. Pancreas: Unremarkable. No pancreatic ductal dilatation or surrounding inflammatory changes. Spleen: Normal in size without focal abnormality. Adrenals/Urinary Tract: Adrenal glands are unremarkable. Kidneys are normal, without renal calculi, focal lesion, or hydronephrosis. Bladder is unremarkable. Stomach/Bowel: Stomach and appendix are unremarkable. Sigmoid diverticulitis is noted. This results in secondary inflammation of adjacent small bowel loops. Also noted is 5.2 x 4.4 cm fluid collection in pre rectal space concerning for abscess. Vascular/Lymphatic: No significant vascular findings are present. No enlarged abdominal or pelvic lymph nodes. Reproductive: Uterus and bilateral adnexa are unremarkable. Other: No hernia is noted. Musculoskeletal: No acute or significant osseous findings. IMPRESSION: Sigmoid diverticulitis  is noted resulting in secondary inflammation and thickening of adjacent small bowel loops. 5.2 x 4.4 cm probable abscess seen in prerectal space. Electronically Signed   By: Lupita Raider, M.D.   On: 03/14/2018 15:33   Ct Image Guided Drainage By Percutaneous Catheter  Result Date: 03/15/2018 INDICATION: Sigmoid diverticulitis, posterior pelvic fluid collection EXAM: CT DRAINAGE PELVIC FLUID COLLECTION MEDICATIONS: The patient is currently  admitted to the hospital and receiving intravenous antibiotics. The antibiotics were administered within an appropriate time frame prior to the initiation of the procedure. ANESTHESIA/SEDATION: Fentanyl 100 mcg IV; Versed 3 mg IV Moderate Sedation Time:  16 MINUTES The patient was continuously monitored during the procedure by the interventional radiology nurse under my direct supervision. COMPLICATIONS: None immediate. PROCEDURE: Informed written consent was obtained from the patient after a thorough discussion of the procedural risks, benefits and alternatives. All questions were addressed. Maximal Sterile Barrier Technique was utilized including caps, mask, sterile gowns, sterile gloves, sterile drape, hand hygiene and skin antiseptic. A timeout was performed prior to the initiation of the procedure. previous imaging reviewed. patient position prone. noncontrast localization ct performed. the posterior pelvic fluid collection was localized. overlying skin marked for a left trans gluteal approach. under sterile conditions and local anesthesia, a 18 gauge 15 cm access was advanced from a left trans gluteal approach to the fluid collection. needle position confirmed with ct. syringe aspiration yielded mildly exudative fluid. sample sent for culture. guidewire inserted followed by tract dilatation to insert a 10 french drain. drain catheter position confirmed with ct. syringe aspiration yielded approximately 15 cc of mildly exudative fluid. this completely collapsed cavity. Catheter secured with a Prolene suture and connected to external suction bulb. Sterile dressing applied. No immediate complication. Patient tolerated the procedure well. IMPRESSION: Successful CT-guided left trans gluteal pelvic abscess drain insertion. Electronically Signed   By: Judie Petit.  Shick M.D.   On: 03/15/2018 13:22    Anti-infectives: Anti-infectives (From admission, onward)   Start     Dose/Rate Route Frequency Ordered Stop   03/15/18  0400  ciprofloxacin (CIPRO) IVPB 400 mg     400 mg 200 mL/hr over 60 Minutes Intravenous Every 12 hours 03/14/18 1722     03/14/18 1830  metroNIDAZOLE (FLAGYL) IVPB 500 mg     500 mg 100 mL/hr over 60 Minutes Intravenous Every 8 hours 03/14/18 1722     03/14/18 1830  fluconazole (DIFLUCAN) IVPB 100 mg     100 mg 50 mL/hr over 60 Minutes Intravenous Every 24 hours 03/14/18 1742     03/14/18 1515  ciprofloxacin (CIPRO) IVPB 400 mg     400 mg 200 mL/hr over 60 Minutes Intravenous  Once 03/14/18 1512 03/14/18 1716   03/14/18 1515  metroNIDAZOLE (FLAGYL) IVPB 500 mg  Status:  Discontinued     500 mg 100 mL/hr over 60 Minutes Intravenous  Once 03/14/18 1512 03/14/18 1808       Assessment/Plan Rheumatoid arthritis, on Humira, Plaquenil, and methotrexate Nonischemic cardiomyopathy with an EF now up to 40-45% Hypertension Acute kidney injury Hx diabetes mellitus-currently on no treatment for this. Obesity  Sigmoid diverticulitis with 5.4 x 4.4 cm pre-rectal abscess/fluid collection -IR placed TG drain yesterday.  CX shows WBC with rare gram + cocci in pairs and chains.  Await final CX. -cont abx therapy, WBC improved to 19K today -try clear liquids today -hopefully she will improve with conservative measures.  Certainly given her currently infection as well as use of Humira, Plaquenil, and methotrexate, she  will certainly get a colostomy if she needed an acute operation.  FEN - IVFs/CLD VTE - SCDs/ok for chemical prophylaxis when IR procedure completed. ID - Cipro/Flagyl 12/3 -->   LOS: 2 days    Letha Cape , [facility] 03/16/2018, 8:24 AM Pager: [phone]

## 2018-03-17 LAB — COMPREHENSIVE METABOLIC PANEL
ALT: 15 U/L (ref 0–44)
AST: 16 U/L (ref 15–41)
Albumin: 3.4 g/dL — ABNORMAL LOW (ref 3.5–5.0)
Alkaline Phosphatase: 94 U/L (ref 38–126)
Anion gap: 10 (ref 5–15)
BUN: 23 mg/dL — ABNORMAL HIGH (ref 6–20)
CHLORIDE: 109 mmol/L (ref 98–111)
CO2: 21 mmol/L — ABNORMAL LOW (ref 22–32)
Calcium: 8.8 mg/dL — ABNORMAL LOW (ref 8.9–10.3)
Creatinine, Ser: 0.73 mg/dL (ref 0.44–1.00)
GFR calc Af Amer: >60 mL/min (ref >60)
GFR calc non Af Amer: >60 mL/min (ref >60)
Glucose, Bld: 101 mg/dL — ABNORMAL HIGH (ref 70–99)
Potassium: 3.8 mmol/L (ref 3.5–5.1)
Sodium: 140 mmol/L (ref 135–145)
Total Bilirubin: 0.5 mg/dL (ref 0.3–1.2)
Total Protein: 8 g/dL (ref 6.5–8.1)

## 2018-03-17 LAB — CBC WITH DIFFERENTIAL/PLATELET
ABS IMMATURE GRANULOCYTES: 0.12 10*3/uL — AB (ref 0.00–0.07)
Basophils Absolute: 0 10*3/uL (ref 0.0–0.1)
Basophils Relative: 0 %
EOS ABS: 0.1 10*3/uL (ref 0.0–0.5)
Eosinophils Relative: 1 %
HCT: 27.8 % — ABNORMAL LOW (ref 36.0–46.0)
Hemoglobin: 8.4 g/dL — ABNORMAL LOW (ref 12.0–15.0)
IMMATURE GRANULOCYTES: 1 %
Lymphocytes Relative: 12 %
Lymphs Abs: 2 10*3/uL (ref 0.7–4.0)
MCH: 29.2 pg (ref 26.0–34.0)
MCHC: 30.2 g/dL (ref 30.0–36.0)
MCV: 96.5 fL (ref 80.0–100.0)
Monocytes Absolute: 2.5 10*3/uL — ABNORMAL HIGH (ref 0.1–1.0)
Monocytes Relative: 16 %
Neutro Abs: 11.1 10*3/uL — ABNORMAL HIGH (ref 1.7–7.7)
Neutrophils Relative %: 70 %
PLATELETS: 371 10*3/uL (ref 150–400)
RBC: 2.88 MIL/uL — ABNORMAL LOW (ref 3.87–5.11)
RDW: 17.6 % — AB (ref 11.5–15.5)
WBC: 15.8 10*3/uL — ABNORMAL HIGH (ref 4.0–10.5)
nRBC: 0 % (ref 0.0–0.2)

## 2018-03-17 LAB — GLUCOSE, CAPILLARY
GLUCOSE-CAPILLARY: 111 mg/dL — AB (ref 70–99)
Glucose-Capillary: 117 mg/dL — ABNORMAL HIGH (ref 70–99)
Glucose-Capillary: 131 mg/dL — ABNORMAL HIGH (ref 70–99)
Glucose-Capillary: 84 mg/dL (ref 70–99)
Glucose-Capillary: 115 mg/dL — ABNORMAL HIGH (ref 70–99)
Glucose-Capillary: 168 mg/dL — ABNORMAL HIGH (ref 70–99)
Glucose-Capillary: 125 mg/dL — ABNORMAL HIGH (ref 70–99)

## 2018-03-17 LAB — MAGNESIUM: MAGNESIUM: 2.2 mg/dL (ref 1.7–2.4)

## 2018-03-17 LAB — PHOSPHORUS: Phosphorus: 3.2 mg/dL (ref 2.5–4.6)

## 2018-03-17 MED ORDER — MORPHINE SULFATE (PF) 2 MG/ML IV SOLN
1.0000 mg | INTRAVENOUS | Status: DC | PRN
  Administered 2018-03-17 – 2018-03-18 (×3): 1 mg via INTRAVENOUS
  Filled 2018-03-17 (×3): qty 1

## 2018-03-17 MED ORDER — FLUCONAZOLE 100 MG PO TABS
100.0000 mg | ORAL_TABLET | Freq: Every day | ORAL | Status: DC
  Administered 2018-03-17 – 2018-03-28 (×12): 100 mg via ORAL
  Filled 2018-03-17 (×12): qty 1

## 2018-03-17 NOTE — Progress Notes (Signed)
Referring Physician(s): Sherrie George  Supervising Physician: Ruel Favors  Patient Status:  [facility] - In-pt  Chief Complaint: Abdominal/pelvic pain  Subjective:  Pelvic/diverticular abscess s/p TG drain placement 03/15/2018 by Dr. Miles Costain. Patient awake and alert laying in bed. Complains of abdominal/pelvic pain.  TG drain incision c/d/i. Tolerating some clear liquids now.  Allergies: Clarithromycin; Hydrocodone; Ondansetron; Penicillins; and Tdap [tetanus-diphth-acell pertussis]  Medications:  Current Facility-Administered Medications:  .  0.9 %  sodium chloride infusion, 250 mL, Intravenous, PRN, Osvaldo Shipper, MD .  acetaminophen (TYLENOL) tablet 650 mg, 650 mg, Oral, Q6H PRN, 650 mg at 03/14/18 1819 **OR** acetaminophen (TYLENOL) suppository 650 mg, 650 mg, Rectal, Q6H PRN, Osvaldo Shipper, MD .  albuterol (PROVENTIL) (2.5 MG/3ML) 0.083% nebulizer solution 2.5 mg, 2.5 mg, Nebulization, Q6H PRN, Osvaldo Shipper, MD .  cetirizine (ZYRTEC) tablet 10 mg, 10 mg, Oral, QHS, Sheikh, Omair Latif, DO, 10 mg at 03/16/18 2007 .  ciprofloxacin (CIPRO) IVPB 400 mg, 400 mg, Intravenous, Q12H, Osvaldo Shipper, MD, Last Rate: 200 mL/hr at 03/17/18 0349, 400 mg at 03/17/18 0349 .  clotrimazole (LOTRIMIN) 1 % cream 1 application, 1 application, Topical, BID, Osvaldo Shipper, MD, 1 application at 03/17/18 [phone] .  diclofenac sodium (VOLTAREN) 1 % transdermal gel 2 g, 2 g, Topical, QID, Sheikh, Kateri Mc Hamburg, DO, 2 g at 03/17/18 9826 .  docusate sodium (COLACE) capsule 100 mg, 100 mg, Oral, Daily, Barnetta Chapel, PA-C, 100 mg at 03/17/18 4158 .  enoxaparin (LOVENOX) injection 40 mg, 40 mg, Subcutaneous, Q24H, Sheikh, Omair Latif, DO, 40 mg at 03/17/18 1246 .  fluconazole (DIFLUCAN) IVPB 100 mg, 100 mg, Intravenous, Q24H, Osvaldo Shipper, MD, Stopped at 03/16/18 2048 .  folic acid (FOLVITE) tablet 1 mg, 1 mg, Oral, Daily, Osvaldo Shipper, MD, 1 mg at 03/17/18 0906 .  insulin aspart (novoLOG)  injection 0-9 Units, 0-9 Units, Subcutaneous, Q4H, Osvaldo Shipper, MD .  isosorbide-hydrALAZINE (BIDIL) 20-37.5 MG per tablet 2 tablet, 2 tablet, Oral, TID, Osvaldo Shipper, MD, 2 tablet at 03/17/18 0906 .  lidocaine (LIDODERM) 5 % 1 patch, 1 patch, Transdermal, Q24H, Marguerita Merles [place], [place], 1 patch at 03/16/18 1239 .  methocarbamol (ROBAXIN) tablet 500 mg, 500 mg, Oral, BID PRN, Osvaldo Shipper, MD .  metoprolol succinate (TOPROL-XL) 24 hr tablet 50 mg, 50 mg, Oral, Daily, Osvaldo Shipper, MD, 50 mg at 03/17/18 0906 .  metroNIDAZOLE (FLAGYL) IVPB 500 mg, 500 mg, Intravenous, Q8H, Osvaldo Shipper, MD, Last Rate: 100 mL/hr at 03/17/18 1050, 500 mg at 03/17/18 1050 .  mometasone-formoterol (DULERA) 200-5 MCG/ACT inhaler 2 puff, 2 puff, Inhalation, BID, Osvaldo Shipper, MD .  morphine 2 MG/ML injection 1 mg, 1 mg, Intravenous, Q4H PRN, Sheikh, Omair Latif, DO .  pantoprazole (PROTONIX) EC tablet 40 mg, 40 mg, Oral, Daily, Osvaldo Shipper, MD, 40 mg at 03/17/18 0906 .  sacubitril-valsartan (ENTRESTO) 97-103 mg per tablet, 1 tablet, Oral, BID, Marguerita Merles [place], [place], 1 tablet at 03/17/18 3094 .  sodium chloride flush (NS) 0.9 % injection 3 mL, 3 mL, Intravenous, Q12H, Osvaldo Shipper, MD, 3 mL at 03/15/18 1001 .  sodium chloride flush (NS) 0.9 % injection 3 mL, 3 mL, Intravenous, PRN, Osvaldo Shipper, MD .  sodium chloride flush (NS) 0.9 % injection 5 mL, 5 mL, Intracatheter, Q8H, Berdine Dance, MD, 5 mL at 03/16/18 1400 .  traMADol (ULTRAM) tablet 50 mg, 50 mg, Oral, QID PRN, Osvaldo Shipper, MD, 50 mg at 03/17/18 0901    Vital Signs: BP (!) 128/56 (BP Location: Left Arm)  Pulse 92   Temp 98.5 F (36.9 C) (Oral)   Resp 17   Ht 5\' 1"  (1.549 m)   Wt 106 kg   LMP 02/22/2018 Comment: negative urine pregnancy test 03-14-2018  SpO2 100%   BMI 44.15 kg/m   Physical Exam  Constitutional: She is oriented to person, place, and time. She appears well-developed and well-nourished. No distress.    Pulmonary/Chest: Effort normal. No respiratory distress.  Abdominal:  TG drain incision without erythema, drainage, or tenderness Small amount of more serous fluid in bulb today  Neurological: She is alert and oriented to person, place, and time.  Skin: Skin is warm and dry.  Psychiatric: She has a normal mood and affect.  Nursing note and vitals reviewed.   Imaging: Ct Abdomen Pelvis W Contrast  Result Date: 03/14/2018 CLINICAL DATA:  Lower abdominal pain, sepsis. EXAM: CT ABDOMEN AND PELVIS WITH CONTRAST TECHNIQUE: Multidetector CT imaging of the abdomen and pelvis was performed using the standard protocol following bolus administration of intravenous contrast. CONTRAST:  14/06/2017 ISOVUE-300 IOPAMIDOL (ISOVUE-300) INJECTION 61% COMPARISON:  None. FINDINGS: Lower chest: No acute abnormality. Hepatobiliary: Cholelithiasis is noted. No biliary dilatation is noted. The liver appears grossly normal. Pancreas: Unremarkable. No pancreatic ductal dilatation or surrounding inflammatory changes. Spleen: Normal in size without focal abnormality. Adrenals/Urinary Tract: Adrenal glands are unremarkable. Kidneys are normal, without renal calculi, focal lesion, or hydronephrosis. Bladder is unremarkable. Stomach/Bowel: Stomach and appendix are unremarkable. Sigmoid diverticulitis is noted. This results in secondary inflammation of adjacent small bowel loops. Also noted is 5.2 x 4.4 cm fluid collection in pre rectal space concerning for abscess. Vascular/Lymphatic: No significant vascular findings are present. No enlarged abdominal or pelvic lymph nodes. Reproductive: Uterus and bilateral adnexa are unremarkable. Other: No hernia is noted. Musculoskeletal: No acute or significant osseous findings. IMPRESSION: Sigmoid diverticulitis is noted resulting in secondary inflammation and thickening of adjacent small bowel loops. 5.2 x 4.4 cm probable abscess seen in prerectal space. Electronically Signed   By: , M.D.   On: 03/14/2018 15:33   Ct Image Guided Drainage By Percutaneous Catheter  Result Date: 03/15/2018 INDICATION: Sigmoid diverticulitis, posterior pelvic fluid collection EXAM: CT DRAINAGE PELVIC FLUID COLLECTION MEDICATIONS: The patient is currently admitted to the hospital and receiving intravenous antibiotics. The antibiotics were administered within an appropriate time frame prior to the initiation of the procedure. ANESTHESIA/SEDATION: Fentanyl 100 mcg IV; Versed 3 mg IV Moderate Sedation Time:  16 MINUTES The patient was continuously monitored during the procedure by the interventional radiology nurse under my direct supervision. COMPLICATIONS: None immediate. PROCEDURE: Informed written consent was obtained from the patient after a thorough discussion of the procedural risks, benefits and alternatives. All questions were addressed. Maximal Sterile Barrier Technique was utilized including caps, mask, sterile gowns, sterile gloves, sterile drape, hand hygiene and skin antiseptic. A timeout was performed prior to the initiation of the procedure. previous imaging reviewed. patient position prone. noncontrast localization ct performed. the posterior pelvic fluid collection was localized. overlying skin marked for a left trans gluteal approach. under sterile conditions and local anesthesia, a 18 gauge 15 cm access was advanced from a left trans gluteal approach to the fluid collection. needle position confirmed with ct. syringe aspiration yielded mildly exudative fluid. sample sent for culture. guidewire inserted followed by tract dilatation to insert a 10 french drain. drain catheter position confirmed with ct. syringe aspiration yielded approximately 15 cc of mildly exudative fluid. this completely collapsed cavity. Catheter secured with a Prolene  suture and connected to external suction bulb. Sterile dressing applied. No immediate complication. Patient tolerated the procedure well. IMPRESSION:  Successful CT-guided left trans gluteal pelvic abscess drain insertion. Electronically Signed   By: Judie Petit.  Shick M.D.   On: 03/15/2018 13:22    Labs:  CBC: Recent Labs    03/14/18 1319 03/15/18 0519 03/16/18 0629 03/17/18 0850  WBC 22.5* 24.1* 19.5* 15.8*  HGB 8.7* 8.6* 7.8* 8.4*  HCT 28.5* 28.8* 25.8* 27.8*  PLT 316 286 351 371    COAGS: Recent Labs    03/15/18 0519  INR 0.99    BMP: Recent Labs    03/14/18 1319 03/15/18 0519 03/16/18 0629 03/17/18 0850  NA 135 138 136 140  K 3.8 4.0 4.0 3.8  CL 105 108 109 109  CO2 20* 18* 19* 21*  GLUCOSE 140* 133* 144* 101*  BUN 40* 27* 28* 23*  CALCIUM 8.3* 8.4* 8.3* 8.8*  CREATININE 1.21* 0.96 0.85 0.73  GFRNONAA 54* >60 >60 >60  GFRAA >60 >60 >60 >60    LIVER FUNCTION TESTS: Recent Labs    03/14/18 1319 03/15/18 0519 03/16/18 0629 03/17/18 0850  BILITOT 0.7 0.9 1.0 0.5  AST 17 16 13* 16  ALT 17 16 14 15   ALKPHOS 76 72 72 94  PROT 8.2* 7.9 7.5 8.0  ALBUMIN 3.5 3.3* 3.1* 3.4*    Assessment and Plan:  Pelvic/diverticular abscess s/p TG drain placement 03/15/2018 by Dr. 14/07/2017. TG drain intact/stable Continue with Qshift flushing and monitoring of output. Appreciate and agree with [facility] management. IR to follow.   Electronically Signed: Miles Costain, PA-C 03/17/2018, 1:49 PM   I spent a total of 25 Minutes at the the patient's bedside AND on the patient's hospital floor or unit, greater than 50% of which was counseling/coordinating care for pelvic/diverticular abscess s/p TG drain placement.

## 2018-03-17 NOTE — Progress Notes (Signed)
Pt req her home meds (valtrex and methotrexate) Pt stated, " I feel some sores about to come in on my hooha and I need my valtrex. RN notified NP. Np stated, " MD Marland Mcalpine is holding those meds and pt will have to ask him about those meds in the am".

## 2018-03-17 NOTE — Care Management Important Message (Signed)
Important Message  Patient Details  Name: [name] MRN: [id number] Date of Birth: 09-17-1970   Medicare Important Message Given:  Yes    Caren Macadam 03/17/2018, 10:23 AMImportant Message  Patient Details  Name: [name] MRN: [id number] Date of Birth: 11-07-1970   Medicare Important Message Given:  Yes    Caren Macadam 03/17/2018, 10:23 AM

## 2018-03-17 NOTE — Progress Notes (Signed)
PROGRESS NOTE    [name]  DGL:[id number] DOB: [birth year] DOA: 03/14/2018 PCP: Shirlean Mylar, MD  Brief Narrative:  HPI per Dr. Osvaldo Shipper on 03/14/18 [name] is a 47 y.o. female with a past medical history of chronic systolic CHF with ejection fraction of 40 to 45% based on echocardiogram from October, history of rheumatoid arthritis followed by rheumatology in [facility] on multiple immunosuppressants, history of diabetes mellitus type 2, history of asthma who developed yeast infection in her lower abdomen and in the groin area for which she has been taking antifungal tablet as well as an antifungal cream.  This has been ongoing for about 2 weeks.  She has had a lot of burning sensation in the lower part at the site of these lesions.  She tells me that with treatment the lesions have been improving.  And then about 3 days ago she started developing pain in the lower abdomen which is slightly different.  She had fever and chills last night although she did not check her temperature.  The pain was 8 out of 10 in intensity.  She had nausea and vomiting x1.  She had a loose stool yesterday.  She has had poor oral intake.  In the emergency department she underwent a CT scan which revealed sigmoid diverticulitis with fluid collection suspicious for abscess.  She was hospitalized for further management.  General surgery was consulted.  Urine pregnancy test was negative.  Last menstrual period was 12th of November.  **She is status post CT drain placement via transgluteal approach on the left side for her pelvic abscess.  Her main complaint is now back pain as her abdominal pain is improved. Pain medications are now attempting to be weaned and her Diuretics have been slowly started to be added back.   Assessment & Plan:   Principal Problem:   Sigmoid diverticulitis Active Problems:   Rheumatoid arthritis (HCC)   Chronic systolic heart failure (HCC)   Intra-abdominal abscess  (HCC)  Acute Sigmoid Diverticulitis with Abscess status post percutaneous drain placement via transgluteal approach -CT of the abdomen pelvis with contrast showed sigmoid diverticulitis resulting in secondary inflammation and thickening of the adjacent small bowel loops.  There is a 5.2 x 4.4 cm probable abscess in the perirectal space -General surgery has been consulted and they recommend broad-spectrum IV antibiotic and percutaneous drainage of abscess.  IR was consulted  and patient underwent percutaneous drain placement by Dr. Denny Levy on 03/15/18 -General surgery recommending continue IV antibiotic therapy continuing bowel rest and n.p.o. for now but diet was advanced to Clears yesterday and attempting to go to FULL Liquid Diet today  -Patient was placed on IV Ciprofloxacin and Metronidazole.  She is allergic to penicillin.   -Due to concern for yeast infection she will be placed on Diflucan as well IV.   -Her WBC is noted to be elevated from infection and WBC peaked to 24,100  and slightly up from 22,500 on admission; Now is trending down is 15,800 -She is afebrile but has slightly elevated heart rate.  No clear sepsis is identified at this time.  Otherwise she is hemodynamically stable.   -Blood cultures x2 show NGTD at 3 days -IR placed drain and aspirated 13 cc of fluid that was sent off for Analysis; Aspirate Gram stain showed few WBCs both PMNs of mononuclear with rare gram-positive cocci in pairs and chains. Cx grew out Few Viridians Streptococcus. IR recommending continued every shift flushing and monitoring of output -  Appreciate further general surgery recommendations. -Pain control with IV morphine 2 mg to 3 PRN chagned to 1 mg q 4PRN for severe pain along with methocarbamol 500 mg p.o. twice daily PRN for muscle spasm -Patient still has no appetite   Back Pain -Likely in the setting of where her drain is placed -Added Lidoderm patch and diclofenac -Continue with IV morphine but  increased frequency from 2 mg every 4 hours as needed severe pain to every 3 hours as needed yesterday but will scale back to 1 mg q4h PRN and will continue po Tramadol 40 mg po 4 times Daily PRN -Continue with methocarbamol 500 mg p.o. twice daily PRN for muscle spasms  Yeast Infection -She has skin lesions in the lower abdomen and in the groin area.   -This is been treated by her primary care provider with antifungal agents.  -C/w Topical Antifungal Agents with Clotrimazole 1 application topically BID -C/w Fluconazole 100 mg IV q24h and change to po now that diet has been advanced.   Chronic Systolic CHF -EF noted to be 40 to 45% based on last echocardiogram done in October.   -She is followed by the heart failure clinic.   -Her elevated BUN and creatinine suggest a degree of hypovolemia.  -She was gently hydrated and now IVF has stopped. -Resumed her Home Lasix dosing today  -Resumed Entresto today as BP is stable but on the lower side at 128.56 -C/w with Metoprolol Succinate 50 mg po Daily and Isosorbide-Hydralazine 20-37.5 mg po TID  -Strict I's and O's, Daily Weights -Patient is +5.682 mL since admission and Weight done yesterday was 233 lbs  -If there is a change in her hemodynamics these medications may have to be stopped. Currently holding her Spironolactone and hoping to resume in AM  -May need to consult Cardiology if there is need for surgical intervention.  Mild Acute Renal Failure, improved  -BUN and creatinine noted to be higher than her usual baseline and was 40/1.21 on Admission.   -Given Gentle IVF hydration for 12 hours and now discontinued. -Avoid Nephrotoxic medications if possible; Resumed Home Entresto and Lasix was resumed today; Continue to hold Spironolactone currently -Monitor urine output. -BUN/Cr has now improved and is 23/0.73 -Repeat CMP in AM   Diabetes Mellitus Type 2 -Continue to Hold  Home Metformin for now.   -HbA1c was checked and 6.2 -CBGs have  been ranging from 83-168 -Continue to Monitor CBG's closely   Rheumatoid Arthritis -Patient on multiple immunosuppressants.   -Has not been on steroids in the last 6 months.  She takes steroids only as needed for flareups.   -She is noted to be on Methotrexate and Hydroxychloroquine.   -She gets Humira injections but has not received them in a few weeks.   -We will hold all of her Immunosuppressants for now given infection.  -She is followed by Rheumatology in [facility] and will need outpatient follow up at D/C.  Normocytic Anemia -Anemia panel done and showed an iron level of 10, U IBC of 255, TIBC of 265, saturation ratios of 4%, ferritin level 460, folate level 20.2, and vitamin B12 level 118 -Hemoglobin lower than her baseline on admission. -Now Hb/Hct is 8.4/27.8 -Will not start IV iron in the setting of active infection -No evidence of overt bleeding currently and will continue to Monitor for S/Sx of Bleeding -Repeat CBC in AM   Morbid Obesity -Estimated body mass index is 44.15 kg/m as calculated from the following:   Height  as of this encounter: 5\' 1"  (1.549 m).   Weight as of this encounter: 106 kg.  -Weight loss Counseling given   DVT prophylaxis: SCDs Code Status: FULL CODE Family Communication: No family present at bedside Disposition Plan: Pending improvement of Diverticulitis and improvement of Abdominal Pain   Consultants:   General Surgery  Interventional Radiology   Procedures:   S/p CT Guided Left Transgluteal Drain placement with 13 cc of Fluid Aspirated    Antimicrobials:  Anti-infectives (From admission, onward)   Start     Dose/Rate Route Frequency Ordered Stop   03/15/18 0400  ciprofloxacin (CIPRO) IVPB 400 mg     400 mg 200 mL/hr over 60 Minutes Intravenous Every 12 hours 03/14/18 1722     03/14/18 1830  metroNIDAZOLE (FLAGYL) IVPB 500 mg     500 mg 100 mL/hr over 60 Minutes Intravenous Every 8 hours 03/14/18 1722     03/14/18 1830   fluconazole (DIFLUCAN) IVPB 100 mg     100 mg 50 mL/hr over 60 Minutes Intravenous Every 24 hours 03/14/18 1742     03/14/18 1515  ciprofloxacin (CIPRO) IVPB 400 mg     400 mg 200 mL/hr over 60 Minutes Intravenous  Once 03/14/18 1512 03/14/18 1716   03/14/18 1515  metroNIDAZOLE (FLAGYL) IVPB 500 mg  Status:  Discontinued     500 mg 100 mL/hr over 60 Minutes Intravenous  Once 03/14/18 1512 03/14/18 1808     Subjective: Seen and Examined at bedside states that she still does not have very much of appetite.  No chest pain, lightheadedness or dizziness.  Abdominal pain is improved when she came in but she still states that she has some pain "deep within."  Also complains of some pain where her drain is placed.  No other concerns or complaints at this time states that she had a bowel movement this morning.  Objective: Vitals:   03/16/18 1434 03/16/18 2114 03/17/18 0358 03/17/18 1438  BP: 111/60 (!) 112/57 (!) 128/56 (!) 95/56  Pulse: (!) 108 (!) 106 92 87  Resp: 20 17 17 16   Temp: 98.3 F (36.8 C) 99.4 F (37.4 C) 98.5 F (36.9 C) 98.4 F (36.9 C)  TempSrc: Oral Oral Oral Oral  SpO2: 100% 100% 100% 100%  Weight:      Height:        Intake/Output Summary (Last 24 hours) at 03/17/2018 1712 Last data filed at 03/17/2018 1500 Gross per 24 hour  Intake 3875.42 ml  Output 20 ml  Net 3855.42 ml   Filed Weights   03/16/18 0105  Weight: 106 kg   Examination: Physical Exam:  Constitutional: Well developed, morbidly obese African-American female currently no acute distress sitting in chair bedside but does appear a little uncomfortable Eyes: Conjunctive are normal.  Sclera anicteric ENMT: External ears and nose appear normal.  Grossly normal hearing Neck: Appears supple with no JVD Respiratory: Diminished to auscultation bilaterally no appreciable wheezing, rales, rhonchi.  Patient not tachypneic wheezing excess muscle breathe Cardiovascular: Mildly tachycardic rate but regular  rhythm.  Has some trace to 1+ lower extremity edema Abdomen: Soft, mildly tender to palpate.  Distended secondary body habitus.  Bowel sounds present GU: Deferred Musculoskeletal: No appreciable clubbing or cyanosis.  No joint deformity noted Skin: Skin is warm and dry.  Has some erythematous lesions in the lower abdomen which is improving.  Has a JP drain coming out of backside of left Neurologic: Cranial nerves II through XII grossly intact with no appreciable  focal deficits Psychiatric: Normal mood and affect.  Intact judgment and insight.  Patient is awake, alert and oriented x3.  Still remains somewhat anxious   Data Reviewed: I have personally reviewed following labs and imaging studies  CBC: Recent Labs  Lab 03/14/18 1319 03/15/18 0519 03/16/18 0629 03/17/18 0850  WBC 22.5* 24.1* 19.5* 15.8*  NEUTROABS 17.9*  --  15.2* 11.1*  HGB 8.7* 8.6* 7.8* 8.4*  HCT 28.5* 28.8* 25.8* 27.8*  MCV 97.3 98.6 95.2 96.5  PLT 316 286 351 371   Basic Metabolic Panel: Recent Labs  Lab 03/14/18 1319 03/15/18 0519 03/16/18 0629 03/17/18 0850  NA 135 138 136 140  K 3.8 4.0 4.0 3.8  CL 105 108 109 109  CO2 20* 18* 19* 21*  GLUCOSE 140* 133* 144* 101*  BUN 40* 27* 28* 23*  CREATININE 1.21* 0.96 0.85 0.73  CALCIUM 8.3* 8.4* 8.3* 8.8*  MG  --   --  2.2 2.2  PHOS  --   --  3.0 3.2   GFR: Estimated Creatinine Clearance: 99.7 mL/min (by C-G formula based on SCr of 0.73 mg/dL). Liver Function Tests: Recent Labs  Lab 03/14/18 1319 03/15/18 0519 03/16/18 0629 03/17/18 0850  AST 17 16 13* 16  ALT 17 16 14 15   ALKPHOS 76 72 72 94  BILITOT 0.7 0.9 1.0 0.5  PROT 8.2* 7.9 7.5 8.0  ALBUMIN 3.5 3.3* 3.1* 3.4*   Recent Labs  Lab 03/14/18 1319  LIPASE 30   No results for input(s): AMMONIA in the last 168 hours. Coagulation Profile: Recent Labs  Lab 03/15/18 0519  INR 0.99   Cardiac Enzymes: No results for input(s): CKTOTAL, CKMB, CKMBINDEX, TROPONINI in the last 168 hours. BNP  (last 3 results) No results for input(s): PROBNP in the last 8760 hours. HbA1C: Recent Labs    03/15/18 0519  HGBA1C 6.2*   CBG: Recent Labs  Lab 03/16/18 2359 03/17/18 0355 03/17/18 0738 03/17/18 1133 03/17/18 1650  GLUCAP 131* 111* 84 115* 168*   Lipid Profile: No results for input(s): CHOL, HDL, LDLCALC, TRIG, CHOLHDL, LDLDIRECT in the last 72 hours. Thyroid Function Tests: No results for input(s): TSH, T4TOTAL, FREET4, T3FREE, THYROIDAB in the last 72 hours. Anemia Panel: Recent Labs    03/15/18 0519  VITAMINB12 118*  FOLATE 20.2  FERRITIN 460*  TIBC 265  IRON 10*  RETICCTPCT 1.3   Sepsis Labs: No results for input(s): PROCALCITON, LATICACIDVEN in the last 168 hours.  Recent Results (from the past 240 hour(s))  Culture, blood (Routine X 2) w Reflex to ID Panel     Status: None (Preliminary result)   Collection Time: 03/14/18  7:12 PM  Result Value Ref Range Status   Specimen Description   Final    BLOOD RIGHT ARM Performed at [facility], [address], [place], [place]    Special Requests   Final    BOTTLES DRAWN AEROBIC AND ANAEROBIC Blood Culture adequate volume Performed at [facility], [address] [place], [place]    Culture   Final    NO GROWTH 3 DAYS Performed at [facility], [address], [place], [place]    Report Status PENDING  Incomplete  Culture, blood (Routine X 2) w Reflex to ID Panel     Status: None (Preliminary result)   Collection Time: 03/14/18  7:12 PM  Result Value Ref Range Status   Specimen Description   Final  BLOOD RIGHT ARM Performed at [facility], [address], [place], [place] [address]    Special Requests   Final    BOTTLES DRAWN AEROBIC AND ANAEROBIC Blood Culture adequate volume Performed at [facility], [address], [place], [place] [address]    Culture   Final    NO GROWTH 3  DAYS Performed at [facility], [address] [place], [place] [address]    Report Status PENDING  Incomplete  MRSA PCR Screening     Status: None   Collection Time: 03/15/18  6:36 AM  Result Value Ref Range Status   MRSA by PCR NEGATIVE NEGATIVE Final    Comment:        The GeneXpert MRSA Assay (FDA approved for NASAL specimens only), is one component of a comprehensive MRSA colonization surveillance program. It is not intended to diagnose MRSA infection nor to guide or monitor treatment for MRSA infections. Performed at [facility], [address], [place], [place] [address]   Aerobic/Anaerobic Culture (surgical/deep wound)     Status: None (Preliminary result)   Collection Time: 03/15/18 11:51 AM  Result Value Ref Range Status   Specimen Description   Final    ABSCESS LEFT BUTTOCKS Performed at [facility], [address] [place], [place] [address]    Special Requests   Final    Normal Performed at [facility], [address], [place], [place] [address]    Gram Stain   Final    FEW WBC PRESENT,BOTH PMN AND MONONUCLEAR RARE GRAM POSITIVE COCCI IN PAIRS AND CHAINS Performed at [facility], [address], [place], [place] [address]    Culture   Final    FEW VIRIDANS STREPTOCOCCUS NO ANAEROBES ISOLATED; CULTURE IN PROGRESS FOR 5 DAYS    Report Status PENDING  Incomplete       Radiology Studies: No results found. Scheduled Meds: . cetirizine  10 mg Oral QHS  . clotrimazole  1 application Topical BID  . diclofenac sodium  2 g Topical QID  . docusate sodium  100 mg Oral Daily  . enoxaparin (LOVENOX) injection  40 mg Subcutaneous Q24H  . folic acid  1 mg Oral Daily  . insulin aspart  0-9 Units Subcutaneous Q4H  . isosorbide-hydrALAZINE  2 tablet Oral TID  . lidocaine  1 patch Transdermal Q24H  . metoprolol succinate  50 mg Oral Daily  . mometasone-formoterol  2 puff Inhalation BID  .  pantoprazole  40 mg Oral Daily  . sacubitril-valsartan  1 tablet Oral BID  . sodium chloride flush  3 mL Intravenous Q12H  . sodium chloride flush  5 mL Intracatheter Q8H   Continuous Infusions: . sodium chloride    . ciprofloxacin 400 mg (03/17/18 1610)  . fluconazole (DIFLUCAN) IV Stopped (03/16/18 2048)  . metronidazole 500 mg (03/17/18 1050)    LOS: 3 days   Merlene Laughter, DO [facility] PAGER is on AMION  If 7PM-7AM, please contact night-coverage [email or web address] Password [email or web address] 03/17/2018, 5:12 PM

## 2018-03-17 NOTE — Progress Notes (Signed)
Patient ID: [name], female   DOB: 02/28/1971, 47 y.o.   MRN: [id number]       Subjective: Pt not moving her bowels, but having a lot of gas.  Not much appetite.  States her pain is "deep" in there under her belly.  Drank some clear liquids yesterday.  Objective: Vital signs in last 24 hours: Temp:  [98.3 F (36.8 C)-99.4 F (37.4 C)] 98.5 F (36.9 C) (12/06 0358) Pulse Rate:  [92-108] 92 (12/06 0358) Resp:  [17-20] 17 (12/06 0358) BP: (103-128)/(56-60) 128/56 (12/06 0358) SpO2:  [100 %] 100 % (12/06 0358) Last BM Date: 03/14/18  Intake/Output from previous day: 12/05 0701 - 12/06 0700 In: 3285.4 [P.O.:355; IV Piggyback:2920.4] Out: 20 [Drains:20] Intake/Output this shift: No intake/output data recorded.  PE: Heart: regular Lungs: CTAB Abd: soft, seems nontender (tells me its deep and pressing on her belly doesn't get to it) some BS, obese  Lab Results:  Recent Labs    03/15/18 0519 03/16/18 0629  WBC 24.1* 19.5*  HGB 8.6* 7.8*  HCT 28.8* 25.8*  PLT 286 351   BMET Recent Labs    03/15/18 0519 03/16/18 0629  NA 138 136  K 4.0 4.0  CL 108 109  CO2 18* 19*  GLUCOSE 133* 144*  BUN 27* 28*  CREATININE 0.96 0.85  CALCIUM 8.4* 8.3*   PT/INR Recent Labs    03/15/18 0519  LABPROT 13.0  INR 0.99   CMP     Component Value Date/Time   NA 136 03/16/2018 0629   NA 142 01/06/2017 1433   K 4.0 03/16/2018 0629   CL 109 03/16/2018 0629   CO2 19 (L) 03/16/2018 0629   GLUCOSE 144 (H) 03/16/2018 0629   BUN 28 (H) 03/16/2018 0629   BUN 16 01/06/2017 1433   CREATININE 0.85 03/16/2018 0629   CALCIUM 8.3 (L) 03/16/2018 0629   PROT 7.5 03/16/2018 0629   ALBUMIN 3.1 (L) 03/16/2018 0629   AST 13 (L) 03/16/2018 0629   ALT 14 03/16/2018 0629   ALKPHOS 72 03/16/2018 0629   BILITOT 1.0 03/16/2018 0629   GFRNONAA >60 03/16/2018 0629   GFRAA >60 03/16/2018 0629   Lipase     Component Value Date/Time   LIPASE 30 03/14/2018 1319        Studies/Results: Ct Image Guided Drainage By Percutaneous Catheter  Result Date: 03/15/2018 INDICATION: Sigmoid diverticulitis, posterior pelvic fluid collection EXAM: CT DRAINAGE PELVIC FLUID COLLECTION MEDICATIONS: The patient is currently admitted to the hospital and receiving intravenous antibiotics. The antibiotics were administered within an appropriate time frame prior to the initiation of the procedure. ANESTHESIA/SEDATION: Fentanyl 100 mcg IV; Versed 3 mg IV Moderate Sedation Time:  16 MINUTES The patient was continuously monitored during the procedure by the interventional radiology nurse under my direct supervision. COMPLICATIONS: None immediate. PROCEDURE: Informed written consent was obtained from the patient after a thorough discussion of the procedural risks, benefits and alternatives. All questions were addressed. Maximal Sterile Barrier Technique was utilized including caps, mask, sterile gowns, sterile gloves, sterile drape, hand hygiene and skin antiseptic. A timeout was performed prior to the initiation of the procedure. previous imaging reviewed. patient position prone. noncontrast localization ct performed. the posterior pelvic fluid collection was localized. overlying skin marked for a left trans gluteal approach. under sterile conditions and local anesthesia, a 18 gauge 15 cm access was advanced from a left trans gluteal approach to the fluid collection. needle position confirmed with ct. syringe aspiration yielded mildly exudative fluid.  sample sent for culture. guidewire inserted followed by tract dilatation to insert a 10 french drain. drain catheter position confirmed with ct. syringe aspiration yielded approximately 15 cc of mildly exudative fluid. this completely collapsed cavity. Catheter secured with a Prolene suture and connected to external suction bulb. Sterile dressing applied. No immediate complication. Patient tolerated the procedure well. IMPRESSION: Successful  CT-guided left trans gluteal pelvic abscess drain insertion. Electronically Signed   By: Judie Petit.  Shick M.D.   On: 03/15/2018 13:22    Anti-infectives: Anti-infectives (From admission, onward)   Start     Dose/Rate Route Frequency Ordered Stop   03/15/18 0400  ciprofloxacin (CIPRO) IVPB 400 mg     400 mg 200 mL/hr over 60 Minutes Intravenous Every 12 hours 03/14/18 1722     03/14/18 1830  metroNIDAZOLE (FLAGYL) IVPB 500 mg     500 mg 100 mL/hr over 60 Minutes Intravenous Every 8 hours 03/14/18 1722     03/14/18 1830  fluconazole (DIFLUCAN) IVPB 100 mg     100 mg 50 mL/hr over 60 Minutes Intravenous Every 24 hours 03/14/18 1742     03/14/18 1515  ciprofloxacin (CIPRO) IVPB 400 mg     400 mg 200 mL/hr over 60 Minutes Intravenous  Once 03/14/18 1512 03/14/18 1716   03/14/18 1515  metroNIDAZOLE (FLAGYL) IVPB 500 mg  Status:  Discontinued     500 mg 100 mL/hr over 60 Minutes Intravenous  Once 03/14/18 1512 03/14/18 1808       Assessment/Plan Rheumatoid arthritis, on Humira, Plaquenil, and methotrexate Nonischemic cardiomyopathy with an EF now up to 40-45% Hypertension Acute kidney injury Hx diabetes mellitus-currently on no treatment for this. Obesity  Sigmoid diverticulitis with 5.4 x 4.4 cm pre-rectal abscess/fluid collection -IR placed TG drain.  CX shows WBC with rare gram + cocci in pairs and chains.  Await final CX. -cont abx therapy, WBC pending today.  Essentially AF -likely adv to full liquids if WBC continuing to improve -hopefully she will improve with conservative measures. Certainly given her currently infection as well as use of Humira, Plaquenil, and methotrexate, she will certainly get a colostomy if she needed an acute operation.  FEN -IVFs/CLD, hopefully adv to FLD VTE -SCDs/Lovenox ID -Cipro/Flagyl 12/3 -->   LOS: 3 days    Letha Cape , [facility] 03/17/2018, 8:41 AM Pager: [phone]

## 2018-03-18 DIAGNOSIS — K5720 Diverticulitis of large intestine with perforation and abscess without bleeding: Secondary | ICD-10-CM

## 2018-03-18 LAB — CBC WITH DIFFERENTIAL/PLATELET
Abs Immature Granulocytes: 0.11 10*3/uL — ABNORMAL HIGH (ref 0.00–0.07)
BASOS PCT: 0 %
Basophils Absolute: 0 10*3/uL (ref 0.0–0.1)
Eosinophils Absolute: 0.1 10*3/uL (ref 0.0–0.5)
Eosinophils Relative: 1 %
HCT: 28.7 % — ABNORMAL LOW (ref 36.0–46.0)
Hemoglobin: 8.6 g/dL — ABNORMAL LOW (ref 12.0–15.0)
Immature Granulocytes: 1 %
Lymphocytes Relative: 10 %
Lymphs Abs: 1.2 10*3/uL (ref 0.7–4.0)
MCH: 28.7 pg (ref 26.0–34.0)
MCHC: 30 g/dL (ref 30.0–36.0)
MCV: 95.7 fL (ref 80.0–100.0)
Monocytes Absolute: 1.9 10*3/uL — ABNORMAL HIGH (ref 0.1–1.0)
Monocytes Relative: 15 %
Neutro Abs: 9 10*3/uL — ABNORMAL HIGH (ref 1.7–7.7)
Neutrophils Relative %: 73 %
Platelets: 512 10*3/uL — ABNORMAL HIGH (ref 150–400)
RBC: 3 MIL/uL — AB (ref 3.87–5.11)
RDW: 17.2 % — ABNORMAL HIGH (ref 11.5–15.5)
WBC: 12.3 10*3/uL — ABNORMAL HIGH (ref 4.0–10.5)
nRBC: 0 % (ref 0.0–0.2)

## 2018-03-18 LAB — COMPREHENSIVE METABOLIC PANEL
ALT: 14 U/L (ref 0–44)
ANION GAP: 8 (ref 5–15)
AST: 18 U/L (ref 15–41)
Albumin: 3.2 g/dL — ABNORMAL LOW (ref 3.5–5.0)
Alkaline Phosphatase: 97 U/L (ref 38–126)
BUN: 23 mg/dL — ABNORMAL HIGH (ref 6–20)
CO2: 21 mmol/L — ABNORMAL LOW (ref 22–32)
Calcium: 8.8 mg/dL — ABNORMAL LOW (ref 8.9–10.3)
Chloride: 110 mmol/L (ref 98–111)
Creatinine, Ser: 0.8 mg/dL (ref 0.44–1.00)
GFR calc non Af Amer: >60 mL/min (ref >60)
GLUCOSE: 126 mg/dL — AB (ref 70–99)
Potassium: 3.5 mmol/L (ref 3.5–5.1)
Sodium: 139 mmol/L (ref 135–145)
Total Bilirubin: 0.3 mg/dL (ref 0.3–1.2)
Total Protein: 8 g/dL (ref 6.5–8.1)

## 2018-03-18 LAB — GLUCOSE, CAPILLARY
GLUCOSE-CAPILLARY: 110 mg/dL — AB (ref 70–99)
GLUCOSE-CAPILLARY: 86 mg/dL (ref 70–99)
Glucose-Capillary: 105 mg/dL — ABNORMAL HIGH (ref 70–99)
Glucose-Capillary: 116 mg/dL — ABNORMAL HIGH (ref 70–99)
Glucose-Capillary: 120 mg/dL — ABNORMAL HIGH (ref 70–99)
Glucose-Capillary: 159 mg/dL — ABNORMAL HIGH (ref 70–99)
Glucose-Capillary: 88 mg/dL (ref 70–99)

## 2018-03-18 LAB — MAGNESIUM: Magnesium: 1.9 mg/dL (ref 1.7–2.4)

## 2018-03-18 LAB — PHOSPHORUS: Phosphorus: 3.3 mg/dL (ref 2.5–4.6)

## 2018-03-18 MED ORDER — LORATADINE 10 MG PO TABS
10.0000 mg | ORAL_TABLET | Freq: Every day | ORAL | Status: DC | PRN
  Administered 2018-03-24 – 2018-03-26 (×3): 10 mg via ORAL
  Filled 2018-03-18 (×4): qty 1

## 2018-03-18 MED ORDER — KETOROLAC TROMETHAMINE 30 MG/ML IJ SOLN
30.0000 mg | Freq: Once | INTRAMUSCULAR | Status: AC
  Administered 2018-03-18: 30 mg via INTRAVENOUS
  Filled 2018-03-18: qty 1

## 2018-03-18 MED ORDER — CLOTRIMAZOLE 1 % EX CREA
1.0000 "application " | TOPICAL_CREAM | Freq: Two times a day (BID) | CUTANEOUS | Status: DC
  Administered 2018-03-18 – 2018-03-27 (×19): 1 via TOPICAL
  Filled 2018-03-18 (×4): qty 15

## 2018-03-18 MED ORDER — MORPHINE SULFATE (PF) 2 MG/ML IV SOLN
1.0000 mg | INTRAVENOUS | Status: DC | PRN
  Administered 2018-03-18: 1 mg via INTRAVENOUS
  Administered 2018-03-18 – 2018-03-20 (×9): 2 mg via INTRAVENOUS
  Filled 2018-03-18 (×10): qty 1

## 2018-03-18 MED ORDER — VALACYCLOVIR HCL 500 MG PO TABS
500.0000 mg | ORAL_TABLET | Freq: Two times a day (BID) | ORAL | Status: DC
  Administered 2018-03-18 – 2018-03-28 (×21): 500 mg via ORAL
  Filled 2018-03-18 (×22): qty 1

## 2018-03-18 NOTE — Progress Notes (Signed)
   Subjective/Chief Complaint: Complains of lower abd pain but says it is getting better   Objective: Vital signs in last 24 hours: Temp:  [98.4 F (36.9 C)-98.8 F (37.1 C)] 98.8 F (37.1 C) (12/07 0416) Pulse Rate:  [87-92] 92 (12/07 0416) Resp:  [12-16] 12 (12/07 0416) BP: (95-107)/(56-65) 101/63 (12/07 0416) SpO2:  [98 %-100 %] 98 % (12/07 0416) Weight:  [104.1 kg] 104.1 kg (12/07 0416) Last BM Date: 03/14/18  Intake/Output from previous day: 12/06 0701 - 12/07 0700 In: 2015.6 [P.O.:1440; I.V.:10; IV Piggyback:550.6] Out: -  Intake/Output this shift: No intake/output data recorded.  General appearance: alert and cooperative Resp: clear to auscultation bilaterally Cardio: regular rate and rhythm GI: soft, mild tenderness  Lab Results:  Recent Labs    03/16/18 0629 03/17/18 0850  WBC 19.5* 15.8*  HGB 7.8* 8.4*  HCT 25.8* 27.8*  PLT 351 371   BMET Recent Labs    03/16/18 0629 03/17/18 0850  NA 136 140  K 4.0 3.8  CL 109 109  CO2 19* 21*  GLUCOSE 144* 101*  BUN 28* 23*  CREATININE 0.85 0.73  CALCIUM 8.3* 8.8*   PT/INR No results for input(s): LABPROT, INR in the last 72 hours. ABG No results for input(s): PHART, HCO3 in the last 72 hours.  Invalid input(s): PCO2, PO2  Studies/Results: No results found.  Anti-infectives: Anti-infectives (From admission, onward)   Start     Dose/Rate Route Frequency Ordered Stop   03/18/18 1000  valACYclovir (VALTREX) tablet 500 mg     500 mg Oral 2 times daily 03/18/18 0806     03/17/18 1830  fluconazole (DIFLUCAN) tablet 100 mg     100 mg Oral Daily 03/17/18 1716     03/15/18 0400  ciprofloxacin (CIPRO) IVPB 400 mg     400 mg 200 mL/hr over 60 Minutes Intravenous Every 12 hours 03/14/18 1722     03/14/18 1830  metroNIDAZOLE (FLAGYL) IVPB 500 mg     500 mg 100 mL/hr over 60 Minutes Intravenous Every 8 hours 03/14/18 1722     03/14/18 1830  fluconazole (DIFLUCAN) IVPB 100 mg  Status:  Discontinued     100  mg 50 mL/hr over 60 Minutes Intravenous Every 24 hours 03/14/18 1742 03/17/18 1716   03/14/18 1515  ciprofloxacin (CIPRO) IVPB 400 mg     400 mg 200 mL/hr over 60 Minutes Intravenous  Once 03/14/18 1512 03/14/18 1716   03/14/18 1515  metroNIDAZOLE (FLAGYL) IVPB 500 mg  Status:  Discontinued     500 mg 100 mL/hr over 60 Minutes Intravenous  Once 03/14/18 1512 03/14/18 1808      Assessment/Plan: s/p * No surgery found * Continue full liquids  Continue iv abx and perc drain for diverticulitis with abscess Restart valtrex  LOS: 4 days    [name] 03/18/2018

## 2018-03-18 NOTE — Progress Notes (Signed)
PROGRESS NOTE    MARCEDES TECH  LSL:[id number] DOB: Apr 29, 1970 DOA: 03/14/2018 PCP: Shirlean Mylar, MD  Brief Narrative:  HPI per Dr. Osvaldo Shipper on 03/14/18 [name] is a 47 y.o. female with a past medical history of chronic systolic CHF with ejection fraction of 40 to 45% based on echocardiogram from October, history of rheumatoid arthritis followed by rheumatology in [facility] on multiple immunosuppressants, history of diabetes mellitus type 2, history of asthma who developed yeast infection in her lower abdomen and in the groin area for which she has been taking antifungal tablet as well as an antifungal cream.  This has been ongoing for about 2 weeks.  She has had a lot of burning sensation in the lower part at the site of these lesions.  She tells me that with treatment the lesions have been improving.  And then about 3 days ago she started developing pain in the lower abdomen which is slightly different.  She had fever and chills last night although she did not check her temperature.  The pain was 8 out of 10 in intensity.  She had nausea and vomiting x1.  She had a loose stool yesterday.  She has had poor oral intake.  In the emergency department she underwent a CT scan which revealed sigmoid diverticulitis with fluid collection suspicious for abscess.  She was hospitalized for further management.  General surgery was consulted.  Urine pregnancy test was negative.  Last menstrual period was 12th of November.  **She is status post CT drain placement via transgluteal approach on the left side for her pelvic abscess.  Her main complaint now lower abdominal pain. Pain medications are now attempting to be weaned and her Diuretics have been slowly started to be added back and she was placed back on po Lasix yesterday .   Assessment & Plan:   Principal Problem:   Perforation of sigmoid colon due to diverticulitis Active Problems:   Rheumatoid arthritis (HCC)   Chronic systolic heart  failure (HCC)   Abscess of sigmoid colon due to diverticulitis s/p perc drainage 03/15/2018  Acute Sigmoid Diverticulitis with Abscess status post percutaneous drain placement via transgluteal approach, improving  -CT of the abdomen pelvis with contrast showed sigmoid diverticulitis resulting in secondary inflammation and thickening of the adjacent small bowel loops.  There is a 5.2 x 4.4 cm probable abscess in the perirectal space -General surgery has been consulted and they recommend broad-spectrum IV antibiotic and percutaneous drainage of abscess.  IR was consulted  and patient underwent percutaneous drain placement by Dr. Denny Levy on 03/15/18 -General surgery recommending continue IV antibiotic therapy continuing bowel rest and n.p.o. for now but diet was advanced to Clears yesterday and attempting to go to FULL Liquid Diet today  -Patient was placed on IV Ciprofloxacin and Metronidazole and we will continue these  She is allergic to penicillin.   -Due to concern for yeast infection she will be placed on Diflucan as well IV but this has been changed to po   -Her WBC is noted to be elevated from infection and WBC peaked to 24,100  and slightly up from 22,500 on admission; Now is trending down is 12,300 -She is afebrile but has slightly elevated heart rate.  No clear sepsis is identified at this time.  Otherwise she is hemodynamically stable.   -Blood cultures x2 show NGTD at 3 days still -IR placed drain and aspirated 13 cc of fluid that was sent off for Analysis; Aspirate Gram  stain showed few WBCs both PMNs of mononuclear with rare gram-positive cocci in pairs and chains. Cx grew out Few Viridians Streptococcus. IR recommending continued every shift flushing and monitoring of output -Appreciate further General surgery recommendations. -Pain control with IV morphine 2 mg to 3 PRN chagned to 1 mg q 4PRN for severe pain along with methocarbamol 500 mg p.o. twice daily PRN for muscle spasm -Patient  still has no appetite and still complaining of some pain and is afraid to move due to the pain -Will try IV Ketorolac 30 mg x1 today   Back Pain -Likely in the setting of where her drain is placed -Added Lidoderm patch and diclofenac -Continue with IV morphine but increased frequency from 2 mg every 4 hours as needed severe pain to every 3 hours as needed yesterday but will scale back to 1 mg q4h PRN and will continue po Tramadol 40 mg po 4 times Daily PRN -Continue with methocarbamol 500 mg p.o. twice daily PRN for muscle spasms -Gave 1x of IV Ketorolac 30 mg x 1  Yeast Infection -She has skin lesions in the lower abdomen and in the groin area.   -This is been treated by her primary care provider with antifungal agents.  -C/w Topical Antifungal Agents with Clotrimazole 1 application topically BID -C/w Fluconazole 100 mg IV q24h and change to po now that diet has been advanced.   Chronic Systolic CHF -EF noted to be 40 to 45% based on last echocardiogram done in October.   -She is followed by the heart failure clinic.   -Her elevated BUN and creatinine suggest a degree of hypovolemia.  -She was gently hydrated and now IVF has stopped. -Resumed her Home Lasix dosing today  -Resumed Entresto today as BP is stable but on the lower side at 128.56 -C/w with Metoprolol Succinate 50 mg po Daily and Isosorbide-Hydralazine 20-37.5 mg po TID  -Strict I's and O's, Daily Weights -Patient is +7.107 mL since admission and Weight is down 3 lbs -If there is a change in her hemodynamics these medications may have to be stopped. Currently holding her Spironolactone again due to lower BP -May need to consult Cardiology if there is need for surgical intervention.  Mild Acute Renal Failure, improved  -BUN and creatinine noted to be higher than her usual baseline and was 40/1.21 on Admission.   -Given Gentle IVF hydration for 12 hours and now discontinued. -Avoid Nephrotoxic medications if possible;  Resumed Home Entresto and Lasix was resumed today; Continue to hold Spironolactone currently -Monitor urine output. -BUN/Cr has now improved and is 23/0.80 -Will give IV Ketorolac 30 mg x1 and closely monitor now that she is back on Lasix and Entresto -Repeat CMP in AM   Diabetes Mellitus Type 2 -Continue to Hold  Home Metformin for now.   -HbA1c was checked and 6.2 -CBGs have been ranging from 88-159 -Continue to Monitor CBG's closely   Rheumatoid Arthritis -Patient on multiple immunosuppressants.   -Has not been on steroids in the last 6 months.  She takes steroids only as needed for flareups.   -She is noted to be on Methotrexate and Hydroxychloroquine.   -She gets Humira injections but has not received them in a few weeks.   -We will hold all of her Immunosuppressants for now given infection.  -She is followed by Rheumatology in [facility] and will need outpatient follow up at D/C. -Given 1x of IV Ketorolac today   Normocytic Anemia -Anemia panel done and showed an  iron level of 10, U IBC of 255, TIBC of 265, saturation ratios of 4%, ferritin level 460, folate level 20.2, and vitamin B12 level 118 -Hemoglobin lower than her baseline on admission. -Now Hb/Hct is 8.6/28.7 -Will not start IV iron in the setting of active infection -No evidence of overt bleeding currently and will continue to Monitor for S/Sx of Bleeding -Repeat CBC in AM   Morbid Obesity -Estimated body mass index is 43.36 kg/m as calculated from the following:   Height as of this encounter: 5\' 1"  (1.549 m).   Weight as of this encounter: 104.1 kg.  -Weight loss Counseling given   DVT prophylaxis: SCDs Code Status: FULL CODE Family Communication: No family present at bedside Disposition Plan: Pending improvement of Diverticulitis and improvement of Abdominal Pain   Consultants:   General Surgery  Interventional Radiology   Procedures:   S/p CT Guided Left Transgluteal Drain placement with 13 cc of  Fluid Aspirated    Antimicrobials:  Anti-infectives (From admission, onward)   Start     Dose/Rate Route Frequency Ordered Stop   03/18/18 1000  valACYclovir (VALTREX) tablet 500 mg     500 mg Oral 2 times daily 03/18/18 0806     03/17/18 1830  fluconazole (DIFLUCAN) tablet 100 mg     100 mg Oral Daily 03/17/18 1716     03/15/18 0400  ciprofloxacin (CIPRO) IVPB 400 mg     400 mg 200 mL/hr over 60 Minutes Intravenous Every 12 hours 03/14/18 1722     03/14/18 1830  metroNIDAZOLE (FLAGYL) IVPB 500 mg     500 mg 100 mL/hr over 60 Minutes Intravenous Every 8 hours 03/14/18 1722     03/14/18 1830  fluconazole (DIFLUCAN) IVPB 100 mg  Status:  Discontinued     100 mg 50 mL/hr over 60 Minutes Intravenous Every 24 hours 03/14/18 1742 03/17/18 1716   03/14/18 1515  ciprofloxacin (CIPRO) IVPB 400 mg     400 mg 200 mL/hr over 60 Minutes Intravenous  Once 03/14/18 1512 03/14/18 1716   03/14/18 1515  metroNIDAZOLE (FLAGYL) IVPB 500 mg  Status:  Discontinued     500 mg 100 mL/hr over 60 Minutes Intravenous  Once 03/14/18 1512 03/14/18 1808     Subjective: Seen and Examined at bedside and was complaining of lower abdominal pain and was afraid to move because moving would exacerbate her pain.  No chest pain, lightheadedness or dizziness.  States the pain is her biggest issue now.  Happy that her numbers were improving.  Still does not have much of an appetite but states she is tolerating the full liquid diet without any issue.  No other concerns or complaints at this time.  Objective: Vitals:   03/17/18 2147 03/18/18 0416 03/18/18 1031 03/18/18 1352  BP: 107/65 101/63 127/82 (!) 96/58  Pulse: 92 92 93 98  Resp: 14 12  16   Temp: 98.5 F (36.9 C) 98.8 F (37.1 C)  98.3 F (36.8 C)  TempSrc: Oral Oral  Oral  SpO2: 99% 98%  99%  Weight:  104.1 kg    Height:        Intake/Output Summary (Last 24 hours) at 03/18/2018 1357 Last data filed at 03/18/2018 1610 Gross per 24 hour  Intake 945.63 ml    Output -  Net 945.63 ml   Filed Weights   03/16/18 0105 03/18/18 0416  Weight: 106 kg 104.1 kg   Examination: Physical Exam:  Constitutional: Well-developed, well-nourished morbidly obese African-American female currently no  acute distress laying in bed appears slightly uncomfortable and complaining of lower abdominal pain Eyes: Conjunctive are normal.  Sclera anicteric ENMT: External ears nose appear normal.  Grossly normal hearing Neck: Appears supple no JVD Respiratory: Slightly diminished to auscultation bilaterally no appreciable wheezing, rales, rhonchi.  Patient not tachypneic using any accessory muscles to breathe Cardiovascular: Regular rate and rhythm but slightly on the faster side.  Has trace to 1+ lower extremity edema still Abdomen:, Soft, not as tender to palpate.  Distended secondary body habitus.  Bowel sounds present GU: Deferred Musculoskeletal: No contractures or cyanosis.  No joint deformities noted Skin: Skin is warm and dry.  There are erythematous lesions in the lower abdomen are slightly improved.  JP drain coming out of her backside on the left buttock Neurologic: Cranial II through XII grossly intact no appreciable focal deficits Psychiatric: Normal mood and affect.  Intact judgment and insight.  Patient is awake and alert, and oriented x3  Data Reviewed: I have personally reviewed following labs and imaging studies  CBC: Recent Labs  Lab 03/14/18 1319 03/15/18 0519 03/16/18 0629 03/17/18 0850 03/18/18 0849  WBC 22.5* 24.1* 19.5* 15.8* 12.3*  NEUTROABS 17.9*  --  15.2* 11.1* 9.0*  HGB 8.7* 8.6* 7.8* 8.4* 8.6*  HCT 28.5* 28.8* 25.8* 27.8* 28.7*  MCV 97.3 98.6 95.2 96.5 95.7  PLT 316 286 351 371 512*   Basic Metabolic Panel: Recent Labs  Lab 03/14/18 1319 03/15/18 0519 03/16/18 0629 03/17/18 0850 03/18/18 0849  NA 135 138 136 140 139  K 3.8 4.0 4.0 3.8 3.5  CL 105 108 109 109 110  CO2 20* 18* 19* 21* 21*  GLUCOSE 140* 133* 144* 101* 126*   BUN 40* 27* 28* 23* 23*  CREATININE 1.21* 0.96 0.85 0.73 0.80  CALCIUM 8.3* 8.4* 8.3* 8.8* 8.8*  MG  --   --  2.2 2.2 1.9  PHOS  --   --  3.0 3.2 3.3   GFR: Estimated Creatinine Clearance: 98.6 mL/min (by C-G formula based on SCr of 0.8 mg/dL). Liver Function Tests: Recent Labs  Lab 03/14/18 1319 03/15/18 0519 03/16/18 0629 03/17/18 0850 03/18/18 0849  AST 17 16 13* 16 18  ALT 17 16 14 15 14   ALKPHOS 76 72 72 94 97  BILITOT 0.7 0.9 1.0 0.5 0.3  PROT 8.2* 7.9 7.5 8.0 8.0  ALBUMIN 3.5 3.3* 3.1* 3.4* 3.2*   Recent Labs  Lab 03/14/18 1319  LIPASE 30   No results for input(s): AMMONIA in the last 168 hours. Coagulation Profile: Recent Labs  Lab 03/15/18 0519  INR 0.99   Cardiac Enzymes: No results for input(s): CKTOTAL, CKMB, CKMBINDEX, TROPONINI in the last 168 hours. BNP (last 3 results) No results for input(s): PROBNP in the last 8760 hours. HbA1C: No results for input(s): HGBA1C in the last 72 hours. CBG: Recent Labs  Lab 03/17/18 2141 03/18/18 0003 03/18/18 0414 03/18/18 0740 03/18/18 1203  GLUCAP 125* 159* 88 120* 116*   Lipid Profile: No results for input(s): CHOL, HDL, LDLCALC, TRIG, CHOLHDL, LDLDIRECT in the last 72 hours. Thyroid Function Tests: No results for input(s): TSH, T4TOTAL, FREET4, T3FREE, THYROIDAB in the last 72 hours. Anemia Panel: No results for input(s): VITAMINB12, FOLATE, FERRITIN, TIBC, IRON, RETICCTPCT in the last 72 hours. Sepsis Labs: No results for input(s): PROCALCITON, LATICACIDVEN in the last 168 hours.  Recent Results (from the past 240 hour(s))  Culture, blood (Routine X 2) w Reflex to ID Panel     Status: None (  Preliminary result)   Collection Time: 03/14/18  7:12 PM  Result Value Ref Range Status   Specimen Description   Final    BLOOD RIGHT ARM Performed at [facility], [address], [place], [place] [address]    Special Requests   Final    BOTTLES DRAWN AEROBIC AND ANAEROBIC Blood Culture  adequate volume Performed at [facility], [address], [place], [place] [address]    Culture   Final    NO GROWTH 3 DAYS Performed at [facility], [address], [place], [place] [address]    Report Status PENDING  Incomplete  Culture, blood (Routine X 2) w Reflex to ID Panel     Status: None (Preliminary result)   Collection Time: 03/14/18  7:12 PM  Result Value Ref Range Status   Specimen Description   Final    BLOOD RIGHT ARM Performed at [facility], [address], [place], [place] [address]    Special Requests   Final    BOTTLES DRAWN AEROBIC AND ANAEROBIC Blood Culture adequate volume Performed at [facility], [address], [place], [place] [address]    Culture   Final    NO GROWTH 3 DAYS Performed at [facility], [address] [place], [place] [address]    Report Status PENDING  Incomplete  MRSA PCR Screening     Status: None   Collection Time: 03/15/18  6:36 AM  Result Value Ref Range Status   MRSA by PCR NEGATIVE NEGATIVE Final    Comment:        The GeneXpert MRSA Assay (FDA approved for NASAL specimens only), is one component of a comprehensive MRSA colonization surveillance program. It is not intended to diagnose MRSA infection nor to guide or monitor treatment for MRSA infections. Performed at [facility], [address], [place], [place] [address]   Aerobic/Anaerobic Culture (surgical/deep wound)     Status: None (Preliminary result)   Collection Time: 03/15/18 11:51 AM  Result Value Ref Range Status   Specimen Description   Final    ABSCESS LEFT BUTTOCKS Performed at [facility], [address], [place], [place] [address]    Special Requests   Final    Normal Performed at [facility], [address] [place], [place] [address]    Gram Stain   Final    FEW WBC PRESENT,BOTH PMN AND MONONUCLEAR RARE GRAM  POSITIVE COCCI IN PAIRS AND CHAINS Performed at [facility], [address], [place], [place] [address]    Culture   Final    FEW VIRIDANS STREPTOCOCCUS NO ANAEROBES ISOLATED; CULTURE IN PROGRESS FOR 5 DAYS    Report Status PENDING  Incomplete       Radiology Studies: No results found. Scheduled Meds: . cetirizine  10 mg Oral QHS  . clotrimazole  1 application Topical BID  . diclofenac sodium  2 g Topical QID  . docusate sodium  100 mg Oral Daily  . enoxaparin (LOVENOX) injection  40 mg Subcutaneous Q24H  . fluconazole  100 mg Oral Daily  . folic acid  1 mg Oral Daily  . insulin aspart  0-9 Units Subcutaneous Q4H  . isosorbide-hydrALAZINE  2 tablet Oral TID  . lidocaine  1 patch Transdermal Q24H  . metoprolol succinate  50 mg Oral Daily  . mometasone-formoterol  2 puff Inhalation BID  . pantoprazole  40 mg Oral  Daily  . sacubitril-valsartan  1 tablet Oral BID  . sodium chloride flush  3 mL Intravenous Q12H  . sodium chloride flush  5 mL Intracatheter Q8H  . valACYclovir  500 mg Oral BID   Continuous Infusions: . sodium chloride 250 mL (03/18/18 4332)  . ciprofloxacin 400 mg (03/18/18 0432)  . metronidazole 500 mg (03/18/18 1047)    LOS: 4 days   Merlene Laughter, DO [facility] PAGER is on AMION  If 7PM-7AM, please contact night-coverage [email or web address] Password [email or web address] 03/18/2018, 1:57 PM

## 2018-03-19 LAB — CULTURE, BLOOD (ROUTINE X 2)
CULTURE: NO GROWTH
CULTURE: NO GROWTH
SPECIAL REQUESTS: ADEQUATE
Special Requests: ADEQUATE

## 2018-03-19 LAB — GLUCOSE, CAPILLARY
GLUCOSE-CAPILLARY: 112 mg/dL — AB (ref 70–99)
Glucose-Capillary: 101 mg/dL — ABNORMAL HIGH (ref 70–99)
Glucose-Capillary: 107 mg/dL — ABNORMAL HIGH (ref 70–99)
Glucose-Capillary: 108 mg/dL — ABNORMAL HIGH (ref 70–99)
Glucose-Capillary: 122 mg/dL — ABNORMAL HIGH (ref 70–99)
Glucose-Capillary: 138 mg/dL — ABNORMAL HIGH (ref 70–99)

## 2018-03-19 LAB — CBC WITH DIFFERENTIAL/PLATELET
Abs Immature Granulocytes: 0.16 10*3/uL — ABNORMAL HIGH (ref 0.00–0.07)
BASOS ABS: 0 10*3/uL (ref 0.0–0.1)
Basophils Relative: 0 %
Eosinophils Absolute: 0.1 10*3/uL (ref 0.0–0.5)
Eosinophils Relative: 1 %
HCT: 24.9 % — ABNORMAL LOW (ref 36.0–46.0)
Hemoglobin: 7.5 g/dL — ABNORMAL LOW (ref 12.0–15.0)
Immature Granulocytes: 2 %
Lymphocytes Relative: 15 %
Lymphs Abs: 1.6 10*3/uL (ref 0.7–4.0)
MCH: 29 pg (ref 26.0–34.0)
MCHC: 30.1 g/dL (ref 30.0–36.0)
MCV: 96.1 fL (ref 80.0–100.0)
Monocytes Absolute: 1.7 10*3/uL — ABNORMAL HIGH (ref 0.1–1.0)
Monocytes Relative: 16 %
NRBC: 0 % (ref 0.0–0.2)
Neutro Abs: 7.1 10*3/uL (ref 1.7–7.7)
Neutrophils Relative %: 66 %
Platelets: 514 10*3/uL — ABNORMAL HIGH (ref 150–400)
RBC: 2.59 MIL/uL — ABNORMAL LOW (ref 3.87–5.11)
RDW: 17.6 % — ABNORMAL HIGH (ref 11.5–15.5)
WBC Morphology: ABNORMAL
WBC: 10.7 10*3/uL — ABNORMAL HIGH (ref 4.0–10.5)

## 2018-03-19 LAB — COMPREHENSIVE METABOLIC PANEL
ALT: 14 U/L (ref 0–44)
AST: 19 U/L (ref 15–41)
Albumin: 2.8 g/dL — ABNORMAL LOW (ref 3.5–5.0)
Alkaline Phosphatase: 85 U/L (ref 38–126)
Anion gap: 10 (ref 5–15)
BUN: 23 mg/dL — AB (ref 6–20)
CO2: 20 mmol/L — ABNORMAL LOW (ref 22–32)
Calcium: 8.2 mg/dL — ABNORMAL LOW (ref 8.9–10.3)
Chloride: 109 mmol/L (ref 98–111)
Creatinine, Ser: 0.91 mg/dL (ref 0.44–1.00)
GFR calc Af Amer: >60 mL/min (ref >60)
GFR calc non Af Amer: >60 mL/min (ref >60)
Glucose, Bld: 145 mg/dL — ABNORMAL HIGH (ref 70–99)
Potassium: 3.4 mmol/L — ABNORMAL LOW (ref 3.5–5.1)
Sodium: 139 mmol/L (ref 135–145)
Total Bilirubin: 0.6 mg/dL (ref 0.3–1.2)
Total Protein: 6.8 g/dL (ref 6.5–8.1)

## 2018-03-19 LAB — MAGNESIUM: Magnesium: 1.6 mg/dL — ABNORMAL LOW (ref 1.7–2.4)

## 2018-03-19 LAB — PHOSPHORUS: Phosphorus: 3.1 mg/dL (ref 2.5–4.6)

## 2018-03-19 MED ORDER — POTASSIUM CHLORIDE CRYS ER 20 MEQ PO TBCR
40.0000 meq | EXTENDED_RELEASE_TABLET | Freq: Two times a day (BID) | ORAL | Status: AC
  Administered 2018-03-19 (×2): 40 meq via ORAL
  Filled 2018-03-19 (×2): qty 2

## 2018-03-19 MED ORDER — FAMOTIDINE 20 MG PO TABS
10.0000 mg | ORAL_TABLET | Freq: Two times a day (BID) | ORAL | Status: DC
  Administered 2018-03-19 – 2018-03-20 (×2): 10 mg via ORAL
  Filled 2018-03-19 (×2): qty 1

## 2018-03-19 MED ORDER — MAGNESIUM SULFATE 2 GM/50ML IV SOLN
2.0000 g | Freq: Once | INTRAVENOUS | Status: AC
  Administered 2018-03-19: 2 g via INTRAVENOUS
  Filled 2018-03-19: qty 50

## 2018-03-19 MED ORDER — CALCIUM CARBONATE ANTACID 500 MG PO CHEW
1.0000 | CHEWABLE_TABLET | Freq: Two times a day (BID) | ORAL | Status: DC
  Administered 2018-03-19 – 2018-03-28 (×17): 200 mg via ORAL
  Filled 2018-03-19 (×17): qty 1

## 2018-03-19 MED ORDER — KETOROLAC TROMETHAMINE 30 MG/ML IJ SOLN
30.0000 mg | Freq: Once | INTRAMUSCULAR | Status: AC
  Administered 2018-03-19: 30 mg via INTRAVENOUS
  Filled 2018-03-19: qty 1

## 2018-03-19 NOTE — Progress Notes (Signed)
   Subjective/Chief Complaint: She feels better.    Objective: Vital signs in last 24 hours: Temp:  [98.3 F (36.8 C)-100 F (37.8 C)] 99.6 F (37.6 C) (12/08 0436) Pulse Rate:  [93-98] 96 (12/08 0436) Resp:  [14-16] 16 (12/08 0436) BP: (96-127)/(58-82) 120/64 (12/08 0436) SpO2:  [97 %-100 %] 97 % (12/08 0436) Last BM Date: 03/14/18  Intake/Output from previous day: 12/07 0701 - 12/08 0700 In: 1193.5 [P.O.:240; I.V.:144; IV Piggyback:799.5] Out: 6 [Drains:6] Intake/Output this shift: No intake/output data recorded.  General appearance: alert and cooperative Resp: clear to auscultation bilaterally Cardio: regular rate and rhythm GI: soft, nontender. drain output small  Lab Results:  Recent Labs    03/18/18 0849 03/19/18 0545  WBC 12.3* 10.7*  HGB 8.6* 7.5*  HCT 28.7* 24.9*  PLT 512* 514*   BMET Recent Labs    03/18/18 0849 03/19/18 0545  NA 139 139  K 3.5 3.4*  CL 110 109  CO2 21* 20*  GLUCOSE 126* 145*  BUN 23* 23*  CREATININE 0.80 0.91  CALCIUM 8.8* 8.2*   PT/INR No results for input(s): LABPROT, INR in the last 72 hours. ABG No results for input(s): PHART, HCO3 in the last 72 hours.  Invalid input(s): PCO2, PO2  Studies/Results: No results found.  Anti-infectives: Anti-infectives (From admission, onward)   Start     Dose/Rate Route Frequency Ordered Stop   03/18/18 1000  valACYclovir (VALTREX) tablet 500 mg     500 mg Oral 2 times daily 03/18/18 0806     03/17/18 1830  fluconazole (DIFLUCAN) tablet 100 mg     100 mg Oral Daily 03/17/18 1716     03/15/18 0400  ciprofloxacin (CIPRO) IVPB 400 mg     400 mg 200 mL/hr over 60 Minutes Intravenous Every 12 hours 03/14/18 1722     03/14/18 1830  metroNIDAZOLE (FLAGYL) IVPB 500 mg     500 mg 100 mL/hr over 60 Minutes Intravenous Every 8 hours 03/14/18 1722     03/14/18 1830  fluconazole (DIFLUCAN) IVPB 100 mg  Status:  Discontinued     100 mg 50 mL/hr over 60 Minutes Intravenous Every 24 hours  03/14/18 1742 03/17/18 1716   03/14/18 1515  ciprofloxacin (CIPRO) IVPB 400 mg     400 mg 200 mL/hr over 60 Minutes Intravenous  Once 03/14/18 1512 03/14/18 1716   03/14/18 1515  metroNIDAZOLE (FLAGYL) IVPB 500 mg  Status:  Discontinued     500 mg 100 mL/hr over 60 Minutes Intravenous  Once 03/14/18 1512 03/14/18 1808      Assessment/Plan: s/p * No surgery found * Advance diet  Continue IV abx. Consider switching to oral abx soon May need repeat CT early this week to evaluate drain if little output  LOS: 5 days    Chevis Pretty III 03/19/2018

## 2018-03-19 NOTE — Progress Notes (Signed)
PROGRESS NOTE    [name]  ZOX:[id number] DOB: Jun 29, 1970 DOA: 03/14/2018 PCP: Shirlean Mylar, MD  Brief Narrative:  HPI per Dr. Osvaldo Shipper on 03/14/18 [name] is a 47 y.o. female with a past medical history of chronic systolic CHF with ejection fraction of 40 to 45% based on echocardiogram from October, history of rheumatoid arthritis followed by rheumatology in [facility] on multiple immunosuppressants, history of diabetes mellitus type 2, history of asthma who developed yeast infection in her lower abdomen and in the groin area for which she has been taking antifungal tablet as well as an antifungal cream.  This has been ongoing for about 2 weeks.  She has had a lot of burning sensation in the lower part at the site of these lesions.  She tells me that with treatment the lesions have been improving.  And then about 3 days ago she started developing pain in the lower abdomen which is slightly different.  She had fever and chills last night although she did not check her temperature.  The pain was 8 out of 10 in intensity.  She had nausea and vomiting x1.  She had a loose stool yesterday.  She has had poor oral intake.  In the emergency department she underwent a CT scan which revealed sigmoid diverticulitis with fluid collection suspicious for abscess.  She was hospitalized for further management.  General surgery was consulted.  Urine pregnancy test was negative.  Last menstrual period was 12th of November.  **She is status post CT drain placement via transgluteal approach on the left side for her pelvic abscess.  Her main complaint now lower abdominal pain. Pain medications are now attempting to be weaned and her Diuretics have been slowly started to be added back and she was placed back on po Lasix. She got a dose of IV Ketorlac and patient states it has helpped with her pain   Assessment & Plan:   Principal Problem:   Perforation of sigmoid colon due to diverticulitis Active  Problems:   Rheumatoid arthritis (HCC)   Chronic systolic heart failure (HCC)   Abscess of sigmoid colon due to diverticulitis s/p perc drainage 03/15/2018  Acute Sigmoid Diverticulitis with Abscess status post percutaneous drain placement via transgluteal approach, improving  -CT of the abdomen pelvis with contrast showed sigmoid diverticulitis resulting in secondary inflammation and thickening of the adjacent small bowel loops.  There is a 5.2 x 4.4 cm probable abscess in the perirectal space -General surgery has been consulted and they recommend broad-spectrum IV antibiotic and percutaneous drainage of abscess.  IR was consulted  and patient underwent percutaneous drain placement by Dr. Denny Levy on 03/15/18 -General surgery recommending continue IV antibiotic therapy for now but recommending advancing diet -Diet is now advanced to Soft Diet  -Patient was placed on IV Ciprofloxacin and Metronidazole and we will continue these for now and change to po soon.  She is allergic to penicillin.   -Due to concern for yeast infection she will be placed on Diflucan as well IV but this has been changed to po   -Her WBC is noted to be elevated from infection and WBC peaked to 24,100  and slightly up from 22,500 on admission; Now is trending down is 10,700 -She is afebrile but has slightly elevated heart rate.  No clear sepsis is identified at this time.  Otherwise she is hemodynamically stable.   -Blood cultures x2 show NGTD at 5 Days -IR placed drain and aspirated 13 cc  of fluid that was sent off for Analysis; Aspirate Gram stain showed few WBCs both PMNs of mononuclear with rare gram-positive cocci in pairs and chains. Cx grew out Few Viridians Streptococcus. IR recommending continued every shift flushing and monitoring of output -Appreciate further General surgery recommendations. -Pain control with IV morphine 2 mg to 3 PRN chagned to 1 mg q 4PRN for severe pain along with methocarbamol 500 mg p.o. twice  daily PRN for muscle spasm -Patient still has no appetite and still complaining of some pain but not as bad  -Will try IV Ketorolac 30 mg x1 again today   Back Pain -Likely in the setting of where her drain is placed -Added Lidoderm patch and diclofenac -Continue with IV morphine but increased frequency from 2 mg every 4 hours as needed severe pain to every 3 hours as needed yesterday but will scale back to 1 mg q4h PRN and will continue po Tramadol 40 mg po 4 times Daily PRN -Continue with methocarbamol 500 mg p.o. twice daily PRN for muscle spasms -Gave 1x of IV Ketorolac 30 mg x 1 again   Yeast Infection -She has skin lesions in the lower abdomen and in the groin area.   -This is been treated by her primary care provider with antifungal agents.  -C/w Topical Antifungal Agents with Clotrimazole 1 application topically BID -C/w Fluconazole 100 mg IV q24h and change to po now that diet has been advanced.   Chronic Systolic CHF -EF noted to be 40 to 45% based on last echocardiogram done in October.   -She is followed by the heart failure clinic.   -Her elevated BUN and creatinine suggest a degree of hypovolemia.  -She was gently hydrated and now IVF has stopped. -Resumed her Home Lasix dosing today  -Resumed Entresto today as BP is stable but on the lower side at 128.56 -C/w with Metoprolol Succinate 50 mg po Daily and Isosorbide-Hydralazine 20-37.5 mg po TID  -Strict I's and O's, Daily Weights -Patient is +9.461 mL since admission and Weight is down 4 lbs -If there is a change in her hemodynamics these medications may have to be stopped. Currently holding her Spironolactone again due to lower BP -May need to consult Cardiology if there is need for surgical intervention.  Mild Acute Renal Failure, improved  -BUN and creatinine noted to be higher than her usual baseline and was 40/1.21 on Admission.   -Given Gentle IVF hydration for 12 hours and now discontinued. -Avoid Nephrotoxic  medications if possible; Resumed Home Entresto and Lasix was resumed today; Continue to hold Spironolactone currently -Monitor urine output. -BUN/Cr has now improved and is 23/0.91 -Will give IV Ketorolac 30 mg x1 again and closely monitor now that she is back on Lasix and Entresto -Repeat CMP in AM   Diabetes Mellitus Type 2 -Continue to Hold  Home Metformin for now.   -HbA1c was checked and 6.2 -CBGs have been ranging from 86-138 -Continue to Monitor CBG's closely   Rheumatoid Arthritis -Patient on multiple immunosuppressants.   -Has not been on steroids in the last 6 months.  She takes steroids only as needed for flareups.   -She is noted to be on Methotrexate and Hydroxychloroquine.   -She gets Humira injections but has not received them in a few weeks.   -We will hold all of her Immunosuppressants for now given infection.  -She is followed by Rheumatology in [facility] and will need outpatient follow up at [facility]. -Given 1x of IV Ketorolac again  today   Normocytic Anemia -Anemia panel done and showed an iron level of 10, U IBC of 255, TIBC of 265, saturation ratios of 4%, ferritin level 460, folate level 20.2, and vitamin B12 level 118 -Hemoglobin lower than her baseline on admission. -Now Hb/Hct is 7.5/24.9 -Will not start IV iron in the setting of active infection -No evidence of overt bleeding currently and will continue to Monitor for S/Sx of Bleeding -Repeat CBC in AM   Morbid Obesity -Estimated body mass index is 43.36 kg/m as calculated from the following:   Height as of this encounter: 5\' 1"  (1.549 m).   Weight as of this encounter: 104.1 kg.  -Weight loss Counseling given   Hypokalemia -Patient potassium is 3.4 -Replete with p.o. potassium quite 40 mcg twice daily x2 doses -Continue monitor replete as necessary -Repeat CMP in a.m.  Hypomagnesemia -Patient magnesium level this point is 1.6 -Replete with IV mag sulfate 2 g -Monitor replete as necessary -Repeat  magnesium level in the a.m.  DVT prophylaxis: SCDs Code Status: FULL CODE Family Communication: No family present at bedside Disposition Plan: Pending improvement of Diverticulitis and improvement of Abdominal Pain   Consultants:   General Surgery  Interventional Radiology   Procedures:   S/p CT Guided Left Transgluteal Drain placement with 13 cc of Fluid Aspirated    Antimicrobials:  Anti-infectives (From admission, onward)   Start     Dose/Rate Route Frequency Ordered Stop   03/18/18 1000  valACYclovir (VALTREX) tablet 500 mg     500 mg Oral 2 times daily 03/18/18 0806     03/17/18 1830  fluconazole (DIFLUCAN) tablet 100 mg     100 mg Oral Daily 03/17/18 1716     03/15/18 0400  ciprofloxacin (CIPRO) IVPB 400 mg     400 mg 200 mL/hr over 60 Minutes Intravenous Every 12 hours 03/14/18 1722     03/14/18 1830  metroNIDAZOLE (FLAGYL) IVPB 500 mg     500 mg 100 mL/hr over 60 Minutes Intravenous Every 8 hours 03/14/18 1722     03/14/18 1830  fluconazole (DIFLUCAN) IVPB 100 mg  Status:  Discontinued     100 mg 50 mL/hr over 60 Minutes Intravenous Every 24 hours 03/14/18 1742 03/17/18 1716   03/14/18 1515  ciprofloxacin (CIPRO) IVPB 400 mg     400 mg 200 mL/hr over 60 Minutes Intravenous  Once 03/14/18 1512 03/14/18 1716   03/14/18 1515  metroNIDAZOLE (FLAGYL) IVPB 500 mg  Status:  Discontinued     500 mg 100 mL/hr over 60 Minutes Intravenous  Once 03/14/18 1512 03/14/18 1808     Subjective: Seen and Examined at bedside and was still complaining of pain but states that medication regimen is working for her.  States the ketorolac worked well for her as well.  No chest pain, lightheadedness or dizziness but was still afraid to move because of pain.  Able to tolerate full liquid diet and will go to soft today.  Her rash and fungal infection in her pannus is improved.  Objective: Vitals:   03/18/18 1352 03/18/18 2054 03/19/18 0436 03/19/18 1335  BP: (!) 96/58 106/60 120/64  106/60  Pulse: 98 97 96 96  Resp: 16 14 16 16   Temp: 98.3 F (36.8 C) 100 F (37.8 C) 99.6 F (37.6 C) 98 F (36.7 C)  TempSrc: Oral Oral Oral Oral  SpO2: 99% 100% 97% 99%  Weight:      Height:        Intake/Output Summary (  Last 24 hours) at 03/19/2018 1847 Last data filed at 03/19/2018 1800 Gross per 24 hour  Intake 1522.57 ml  Output -  Net 1522.57 ml   Filed Weights   03/16/18 0105 03/18/18 0416  Weight: 106 kg 104.1 kg   Examination: Physical Exam:  Constitutional: Well-nourished, well-developed morbidly obese African-American female currently no acute distress laying in bed and does appear a little bit uncomfortable but states her pain is getting better and happy that her numbers are improving Eyes: Conjunctive are normal.  Sclera anicteric ENMT: External ears and nose appear normal.  Grossly normal hearing Neck: Appears supple no JVD Respiratory: Diminished to auscultation bilaterally with no appreciable wheezing, rales, rhonchi.  Patient not tachypneic using accessory muscles to breathe Cardiovascular: Regular rate and rhythm.  Trace lower extremity edema Abdomen: Soft, not as tender to palpate today again.  Distended secondary body habitus.  Bowel sounds present GU: Deferred Musculoskeletal: No contractures or cyanosis.  No joint deformities noted Skin: Warm and dry.  Erythematous lesions in her pannus and lower abdomen have improved dramatically.  JP drain coming out of the backside of the left buttock Neurologic: Nerves II through XII grossly intact no appreciable focal deficit Psychiatric: Normal mood and affect.  Intact judgment side.  Patient is awake alert and oriented x3  Data Reviewed: I have personally reviewed following labs and imaging studies  CBC: Recent Labs  Lab 03/14/18 1319 03/15/18 0519 03/16/18 0629 03/17/18 0850 03/18/18 0849 03/19/18 0545  WBC 22.5* 24.1* 19.5* 15.8* 12.3* 10.7*  NEUTROABS 17.9*  --  15.2* 11.1* 9.0* 7.1  HGB 8.7* 8.6*  7.8* 8.4* 8.6* 7.5*  HCT 28.5* 28.8* 25.8* 27.8* 28.7* 24.9*  MCV 97.3 98.6 95.2 96.5 95.7 96.1  PLT 316 286 351 371 512* 514*   Basic Metabolic Panel: Recent Labs  Lab 03/15/18 0519 03/16/18 0629 03/17/18 0850 03/18/18 0849 03/19/18 0545  NA 138 136 140 139 139  K 4.0 4.0 3.8 3.5 3.4*  CL 108 109 109 110 109  CO2 18* 19* 21* 21* 20*  GLUCOSE 133* 144* 101* 126* 145*  BUN 27* 28* 23* 23* 23*  CREATININE 0.96 0.85 0.73 0.80 0.91  CALCIUM 8.4* 8.3* 8.8* 8.8* 8.2*  MG  --  2.2 2.2 1.9 1.6*  PHOS  --  3.0 3.2 3.3 3.1   GFR: Estimated Creatinine Clearance: 86.6 mL/min (by C-G formula based on SCr of 0.91 mg/dL). Liver Function Tests: Recent Labs  Lab 03/15/18 0519 03/16/18 0629 03/17/18 0850 03/18/18 0849 03/19/18 0545  AST 16 13* 16 18 19   ALT 16 14 15 14 14   ALKPHOS 72 72 94 97 85  BILITOT 0.9 1.0 0.5 0.3 0.6  PROT 7.9 7.5 8.0 8.0 6.8  ALBUMIN 3.3* 3.1* 3.4* 3.2* 2.8*   Recent Labs  Lab 03/14/18 1319  LIPASE 30   No results for input(s): AMMONIA in the last 168 hours. Coagulation Profile: Recent Labs  Lab 03/15/18 0519  INR 0.99   Cardiac Enzymes: No results for input(s): CKTOTAL, CKMB, CKMBINDEX, TROPONINI in the last 168 hours. BNP (last 3 results) No results for input(s): PROBNP in the last 8760 hours. HbA1C: No results for input(s): HGBA1C in the last 72 hours. CBG: Recent Labs  Lab 03/18/18 2353 03/19/18 0434 03/19/18 0734 03/19/18 1210 03/19/18 1717  GLUCAP 110* 108* 112* 101* 138*   Lipid Profile: No results for input(s): CHOL, HDL, LDLCALC, TRIG, CHOLHDL, LDLDIRECT in the last 72 hours. Thyroid Function Tests: No results for input(s): TSH, T4TOTAL, FREET4,  T3FREE, THYROIDAB in the last 72 hours. Anemia Panel: No results for input(s): VITAMINB12, FOLATE, FERRITIN, TIBC, IRON, RETICCTPCT in the last 72 hours. Sepsis Labs: No results for input(s): PROCALCITON, LATICACIDVEN in the last 168 hours.  Recent Results (from the past 240  hour(s))  Culture, blood (Routine X 2) w Reflex to ID Panel     Status: None   Collection Time: 03/14/18  7:12 PM  Result Value Ref Range Status   Specimen Description   Final    BLOOD RIGHT ARM Performed at [facility], [address], [place], [place] [address]    Special Requests   Final    BOTTLES DRAWN AEROBIC AND ANAEROBIC Blood Culture adequate volume Performed at [facility], [address], [place], [place] [address]    Culture   Final    NO GROWTH 5 DAYS Performed at [facility], [address], [place], [place] [address]    Report Status 03/19/2018 FINAL  Final  Culture, blood (Routine X 2) w Reflex to ID Panel     Status: None   Collection Time: 03/14/18  7:12 PM  Result Value Ref Range Status   Specimen Description   Final    BLOOD RIGHT ARM Performed at [facility], [address], [place], [place] [address]    Special Requests   Final    BOTTLES DRAWN AEROBIC AND ANAEROBIC Blood Culture adequate volume Performed at [facility], [address], [place], [place] [address]    Culture   Final    NO GROWTH 5 DAYS Performed at [facility], [address], [place], [place] [address]    Report Status 03/19/2018 FINAL  Final  MRSA PCR Screening     Status: None   Collection Time: 03/15/18  6:36 AM  Result Value Ref Range Status   MRSA by PCR NEGATIVE NEGATIVE Final    Comment:        The GeneXpert MRSA Assay (FDA approved for NASAL specimens only), is one component of a comprehensive MRSA colonization surveillance program. It is not intended to diagnose MRSA infection nor to guide or monitor treatment for MRSA infections. Performed at [facility], [address] [place], [place] [address]   Aerobic/Anaerobic Culture (surgical/deep wound)     Status: None (Preliminary result)   Collection Time: 03/15/18 11:51 AM  Result Value Ref Range Status    Specimen Description   Final    ABSCESS LEFT BUTTOCKS Performed at [facility], [address], [place], [place] [address]    Special Requests   Final    Normal Performed at [facility], [address], [place], [place] [address]    Gram Stain   Final    FEW WBC PRESENT,BOTH PMN AND MONONUCLEAR RARE GRAM POSITIVE COCCI IN PAIRS AND CHAINS Performed at [facility], [address], [place], [place] [address]    Culture   Final    FEW VIRIDANS STREPTOCOCCUS NO ANAEROBES ISOLATED; CULTURE IN PROGRESS FOR 5 DAYS    Report Status PENDING  Incomplete       Radiology Studies: No results found. Scheduled Meds: . calcium carbonate  1 tablet Oral BID  . cetirizine  10 mg Oral QHS  . clotrimazole  1 application Topical BID  . diclofenac sodium  2 g Topical QID  . docusate sodium  100 mg Oral Daily  . enoxaparin (LOVENOX) injection  40  mg Subcutaneous Q24H  . fluconazole  100 mg Oral Daily  . folic acid  1 mg Oral Daily  . insulin aspart  0-9 Units Subcutaneous Q4H  . isosorbide-hydrALAZINE  2 tablet Oral TID  . lidocaine  1 patch Transdermal Q24H  . metoprolol succinate  50 mg Oral Daily  . mometasone-formoterol  2 puff Inhalation BID  . pantoprazole  40 mg Oral Daily  . potassium chloride  40 mEq Oral BID  . sacubitril-valsartan  1 tablet Oral BID  . sodium chloride flush  3 mL Intravenous Q12H  . sodium chloride flush  5 mL Intracatheter Q8H  . valACYclovir  500 mg Oral BID   Continuous Infusions: . sodium chloride 10 mL/hr at 03/19/18 1800  . ciprofloxacin Stopped (03/19/18 1714)  . metronidazole Stopped (03/19/18 1247)    LOS: 5 days   Merlene Laughter, DO [facility] PAGER is on AMION  If 7PM-7AM, please contact night-coverage [email or web address] Password [facility] 03/19/2018, 6:47 PM

## 2018-03-20 LAB — CBC WITH DIFFERENTIAL/PLATELET
Abs Immature Granulocytes: 0.19 10*3/uL — ABNORMAL HIGH (ref 0.00–0.07)
BASOS ABS: 0 10*3/uL (ref 0.0–0.1)
Basophils Relative: 0 %
EOS PCT: 1 %
Eosinophils Absolute: 0.1 10*3/uL (ref 0.0–0.5)
HCT: 26.3 % — ABNORMAL LOW (ref 36.0–46.0)
Hemoglobin: 7.8 g/dL — ABNORMAL LOW (ref 12.0–15.0)
Immature Granulocytes: 1 %
Lymphocytes Relative: 14 %
Lymphs Abs: 2.1 10*3/uL (ref 0.7–4.0)
MCH: 29.1 pg (ref 26.0–34.0)
MCHC: 29.7 g/dL — ABNORMAL LOW (ref 30.0–36.0)
MCV: 98.1 fL (ref 80.0–100.0)
Monocytes Absolute: 1.6 10*3/uL — ABNORMAL HIGH (ref 0.1–1.0)
Monocytes Relative: 11 %
NRBC: 0 % (ref 0.0–0.2)
Neutro Abs: 10.8 10*3/uL — ABNORMAL HIGH (ref 1.7–7.7)
Neutrophils Relative %: 73 %
Platelets: 565 10*3/uL — ABNORMAL HIGH (ref 150–400)
RBC: 2.68 MIL/uL — ABNORMAL LOW (ref 3.87–5.11)
RDW: 17.4 % — ABNORMAL HIGH (ref 11.5–15.5)
WBC: 14.7 10*3/uL — ABNORMAL HIGH (ref 4.0–10.5)

## 2018-03-20 LAB — GLUCOSE, CAPILLARY
Glucose-Capillary: 104 mg/dL — ABNORMAL HIGH (ref 70–99)
Glucose-Capillary: 110 mg/dL — ABNORMAL HIGH (ref 70–99)
Glucose-Capillary: 119 mg/dL — ABNORMAL HIGH (ref 70–99)
Glucose-Capillary: 125 mg/dL — ABNORMAL HIGH (ref 70–99)
Glucose-Capillary: 153 mg/dL — ABNORMAL HIGH (ref 70–99)
Glucose-Capillary: 97 mg/dL (ref 70–99)

## 2018-03-20 LAB — BASIC METABOLIC PANEL
Anion gap: 8 (ref 5–15)
BUN: 15 mg/dL (ref 6–20)
CO2: 18 mmol/L — ABNORMAL LOW (ref 22–32)
Calcium: 8.2 mg/dL — ABNORMAL LOW (ref 8.9–10.3)
Chloride: 113 mmol/L — ABNORMAL HIGH (ref 98–111)
Creatinine, Ser: 0.7 mg/dL (ref 0.44–1.00)
GFR calc Af Amer: >60 mL/min (ref >60)
Glucose, Bld: 135 mg/dL — ABNORMAL HIGH (ref 70–99)
Potassium: 4.5 mmol/L (ref 3.5–5.1)
Sodium: 139 mmol/L (ref 135–145)

## 2018-03-20 LAB — AEROBIC/ANAEROBIC CULTURE W GRAM STAIN (SURGICAL/DEEP WOUND): Special Requests: NORMAL

## 2018-03-20 LAB — PHOSPHORUS: Phosphorus: 2.2 mg/dL — ABNORMAL LOW (ref 2.5–4.6)

## 2018-03-20 LAB — MAGNESIUM: Magnesium: 1.9 mg/dL (ref 1.7–2.4)

## 2018-03-20 MED ORDER — FAMOTIDINE 20 MG PO TABS
20.0000 mg | ORAL_TABLET | Freq: Two times a day (BID) | ORAL | Status: DC
  Administered 2018-03-20 – 2018-03-28 (×16): 20 mg via ORAL
  Filled 2018-03-20 (×16): qty 1

## 2018-03-20 MED ORDER — KETOROLAC TROMETHAMINE 30 MG/ML IJ SOLN: 30.0000 mg | Freq: Once | INTRAMUSCULAR | Status: DC

## 2018-03-20 MED ORDER — K PHOS MONO-SOD PHOS DI & MONO 155-852-130 MG PO TABS
500.0000 mg | ORAL_TABLET | Freq: Two times a day (BID) | ORAL | Status: AC
  Administered 2018-03-20 – 2018-03-21 (×2): 500 mg via ORAL
  Filled 2018-03-20 (×2): qty 2

## 2018-03-20 MED ORDER — SIMETHICONE 80 MG PO CHEW: 80.0000 mg | CHEWABLE_TABLET | Freq: Four times a day (QID) | ORAL | Status: DC | PRN

## 2018-03-20 MED ORDER — FUROSEMIDE 40 MG PO TABS
120.0000 mg | ORAL_TABLET | Freq: Every day | ORAL | Status: DC
  Administered 2018-03-21 – 2018-03-28 (×8): 120 mg via ORAL
  Filled 2018-03-20 (×9): qty 3

## 2018-03-20 MED ORDER — SIMETHICONE 80 MG PO CHEW
80.0000 mg | CHEWABLE_TABLET | Freq: Once | ORAL | Status: AC
  Administered 2018-03-20: 80 mg via ORAL
  Filled 2018-03-20: qty 1

## 2018-03-20 MED ORDER — KETOROLAC TROMETHAMINE 30 MG/ML IJ SOLN
15.0000 mg | Freq: Two times a day (BID) | INTRAMUSCULAR | Status: AC | PRN
  Administered 2018-03-21 (×2): 15 mg via INTRAVENOUS
  Filled 2018-03-20 (×3): qty 1

## 2018-03-20 MED ORDER — TRAMADOL HCL 50 MG PO TABS
100.0000 mg | ORAL_TABLET | Freq: Four times a day (QID) | ORAL | Status: DC | PRN
  Administered 2018-03-20 – 2018-03-23 (×8): 100 mg via ORAL
  Filled 2018-03-20 (×9): qty 2

## 2018-03-20 NOTE — Progress Notes (Signed)
Referring Physician(s): Toth,P  Supervising Physician: Oley Balm  Patient Status:  [facility] - In-pt  Chief Complaint: Abdominal /pelvic pain  Subjective: Pt feeling a little better today; still has left pelvic soreness; denies N/V; passing flatus    Allergies: Clarithromycin; Hydrocodone; Ondansetron; Penicillins; and Tdap [tetanus-diphth-acell pertussis]  Medications: Prior to Admission medications   Medication Sig Start Date End Date Taking? Authorizing Provider  Adalimumab (HUMIRA) 20 MG/0.2ML PSKT Inject into the skin 2 (two) times a week.   Yes [provider]  albuterol (PROAIR HFA) 108 (90 BASE) MCG/ACT inhaler Inhale 2 puffs into the lungs every 6 (six) hours as needed for wheezing or shortness of breath.   Yes [provider]  albuterol (PROVENTIL) (2.5 MG/3ML) 0.083% nebulizer solution Take 2.5 mg by nebulization every 6 (six) hours as needed for wheezing or shortness of breath.   Yes [provider]  clotrimazole (LOTRIMIN) 1 % cream Apply 1 application topically 2 (two) times daily. 03/13/18  Yes [provider]  ferrous gluconate (FERGON) 324 MG tablet Take 324 mg by mouth daily.   Yes [provider]  folic acid (FOLVITE) 1 MG tablet Take 1 mg by mouth daily.   Yes [provider]  furosemide (LASIX) 40 MG tablet Take 3 tablets (120mg ) in the mornings and 2 tablets (80mg ) in the evenings by mouth daily Patient taking differently: Take 120 mg by mouth every morning.  05/20/17  Yes , PA-C  hydroxychloroquine (PLAQUENIL) 200 MG tablet Take 400 mg by mouth daily.    Yes [provider]  isosorbide-hydrALAZINE (BIDIL) 20-37.5 MG tablet Take 2 tablets by mouth 3 (three) times daily. 05/20/17  Yes Graciella Freer, PA-C  levocetirizine (XYZAL) 5 MG tablet Take 5 mg by mouth at bedtime.    Yes [provider]  metFORMIN (GLUCOPHAGE) 500 MG tablet Take 1,000 mg by mouth 2 (two)  times daily with a meal.    Yes [provider]  methocarbamol (ROBAXIN) 500 MG tablet Take 500 mg by mouth 2 (two) times daily as needed (for muscle spasms.).    Yes [provider]  Methotrexate, PF, 25 MG/0.5ML SOAJ Inject 0.7 mLs into the skin once a week. On Friday   Yes [provider]  metoprolol succinate (TOPROL-XL) 50 MG 24 hr tablet Take 1.5 tabs daily 01/26/18  Yes Saturday, MD  omeprazole (PRILOSEC) 40 MG capsule Take 40 mg by mouth daily before breakfast.  09/29/16  Yes [provider]  sacubitril-valsartan (ENTRESTO) 97-103 MG Take 1 tablet by mouth 2 (two) times daily. 05/20/17  Yes 10/01/16, PA-C  SYMBICORT 160-4.5 MCG/ACT inhaler Inhale 2 puffs into the lungs daily as needed (for respiratory issues.).  06/01/13  Yes [provider]  traMADol (ULTRAM) 50 MG tablet Take 1 tablet (50 mg total) by mouth 4 (four) times daily. pain Patient taking differently: Take 50 mg by mouth 4 (four) times daily as needed (for pain.).  03/05/12  Yes Rai, Ripudeep K, MD  triamcinolone cream (KENALOG) 0.1 % Apply 1 application topically 2 (two) times daily as needed (for ezcema.).   Yes [provider]  valACYclovir (VALTREX) 500 MG tablet Take 500 mg by mouth 2 (two) times daily as needed (for cold sores.).   Yes [provider]  spironolactone (ALDACTONE) 25 MG tablet Take 1 tablet (25 mg total) by mouth daily. 05/20/17 01/26/18  07/18/17, PA-C     Vital Signs: BP 107/65 (BP  Location: Left Arm)   Pulse 91   Temp 98.9 F (37.2 C) (Oral)   Resp 17   Ht 5\' 1"  (1.549 m)   Wt 233 lb 7.5 oz (105.9 kg)   LMP 02/22/2018 Comment: negative urine pregnancy test 03-14-2018  SpO2 100%   BMI 44.11 kg/m   Physical Exam awake/alert; left TG drain intact, site mildly tender, serous looking fluid in JP bulb, output 15 cc  Imaging: No results found.  Labs:  CBC: Recent Labs    03/17/18 0850 03/18/18 0849  03/19/18 0545 03/20/18 0516  WBC 15.8* 12.3* 10.7* 14.7*  HGB 8.4* 8.6* 7.5* 7.8*  HCT 27.8* 28.7* 24.9* 26.3*  PLT 371 512* 514* 565*    COAGS: Recent Labs    03/15/18 0519  INR 0.99    BMP: Recent Labs    03/17/18 0850 03/18/18 0849 03/19/18 0545 03/20/18 0516  NA 140 139 139 139  K 3.8 3.5 3.4* 4.5  CL 109 110 109 113*  CO2 21* 21* 20* 18*  GLUCOSE 101* 126* 145* 135*  BUN 23* 23* 23* 15  CALCIUM 8.8* 8.8* 8.2* 8.2*  CREATININE 0.73 0.80 0.91 0.70  GFRNONAA >60 >60 >60 >60  GFRAA >60 >60 >60 >60    LIVER FUNCTION TESTS: Recent Labs    03/16/18 0629 03/17/18 0850 03/18/18 0849 03/19/18 0545  BILITOT 1.0 0.5 0.3 0.6  AST 13* 16 18 19   ALT 14 15 14 14   ALKPHOS 72 94 97 85  PROT 7.5 8.0 8.0 6.8  ALBUMIN 3.1* 3.4* 3.2* 2.8*    Assessment and Plan: Pt with hx sigmoid diverticulitis with assoc abscess, s/p drain placement 12/4; WBC 14.7(10.7), HGB 7.8(7.5), creat nl; drain fluid cx- few viridans strept; check f/u CT tomorrow   Electronically Signed: D. , PA-C 03/20/2018, 2:49 PM   I spent a total of 15 minutes at the the patient's bedside AND on the patient's hospital floor or unit, greater than 50% of which was counseling/coordinating care for pelvic abscess drain    Patient ID: [id number], female   DOB: 1971/03/12, 47 y.o.   MRN: Genia Harold

## 2018-03-20 NOTE — Progress Notes (Signed)
Patient ID: [name], female   DOB: [birth year], 47 y.o.   MRN: [id number]       Subjective: Patient laying in bed and chooses to not interact much today.  Says she just has a dull ache in her abdomen.  Tolerating soft diet as best as I can tell.  Objective: Vital signs in last 24 hours: Temp:  [98 F (36.7 C)-99.6 F (37.6 C)] 99.6 F (37.6 C) (12/09 0419) Pulse Rate:  [90-96] 96 (12/09 0419) Resp:  [14-16] 14 (12/09 0419) BP: (106-123)/(60-67) 123/67 (12/09 0419) SpO2:  [96 %-99 %] 96 % (12/09 0419) Weight:  [105.9 kg] 105.9 kg (12/09 0419) Last BM Date: 03/14/18  Intake/Output from previous day: 12/08 0701 - 12/09 0700 In: 1291.4 [P.O.:600; I.V.:102.5; IV Piggyback:583.9] Out: 15 [Drains:15] Intake/Output this shift: No intake/output data recorded.  PE: Abd: soft, seems nontender, +BS, obese, drain with just flush present.  Lab Results:  Recent Labs    03/19/18 0545 03/20/18 0516  WBC 10.7* 14.7*  HGB 7.5* 7.8*  HCT 24.9* 26.3*  PLT 514* 565*   BMET Recent Labs    03/19/18 0545 03/20/18 0516  NA 139 139  K 3.4* 4.5  CL 109 113*  CO2 20* 18*  GLUCOSE 145* 135*  BUN 23* 15  CREATININE 0.91 0.70  CALCIUM 8.2* 8.2*   PT/INR No results for input(s): LABPROT, INR in the last 72 hours. CMP     Component Value Date/Time   NA 139 03/20/2018 0516   NA 142 01/06/2017 1433   K 4.5 03/20/2018 0516   CL 113 (H) 03/20/2018 0516   CO2 18 (L) 03/20/2018 0516   GLUCOSE 135 (H) 03/20/2018 0516   BUN 15 03/20/2018 0516   BUN 16 01/06/2017 1433   CREATININE 0.70 03/20/2018 0516   CALCIUM 8.2 (L) 03/20/2018 0516   PROT 6.8 03/19/2018 0545   ALBUMIN 2.8 (L) 03/19/2018 0545   AST 19 03/19/2018 0545   ALT 14 03/19/2018 0545   ALKPHOS 85 03/19/2018 0545   BILITOT 0.6 03/19/2018 0545   GFRNONAA >60 03/20/2018 0516   GFRAA >60 03/20/2018 0516   Lipase     Component Value Date/Time   LIPASE 30 03/14/2018 1319       Studies/Results: No results  found.  Anti-infectives: Anti-infectives (From admission, onward)   Start     Dose/Rate Route Frequency Ordered Stop   03/18/18 1000  valACYclovir (VALTREX) tablet 500 mg     500 mg Oral 2 times daily 03/18/18 0806     03/17/18 1830  fluconazole (DIFLUCAN) tablet 100 mg     100 mg Oral Daily 03/17/18 1716     03/15/18 0400  ciprofloxacin (CIPRO) IVPB 400 mg     400 mg 200 mL/hr over 60 Minutes Intravenous Every 12 hours 03/14/18 1722     03/14/18 1830  metroNIDAZOLE (FLAGYL) IVPB 500 mg     500 mg 100 mL/hr over 60 Minutes Intravenous Every 8 hours 03/14/18 1722     03/14/18 1830  fluconazole (DIFLUCAN) IVPB 100 mg  Status:  Discontinued     100 mg 50 mL/hr over 60 Minutes Intravenous Every 24 hours 03/14/18 1742 03/17/18 1716   03/14/18 1515  ciprofloxacin (CIPRO) IVPB 400 mg     400 mg 200 mL/hr over 60 Minutes Intravenous  Once 03/14/18 1512 03/14/18 1716   03/14/18 1515  metroNIDAZOLE (FLAGYL) IVPB 500 mg  Status:  Discontinued     500 mg 100 mL/hr over 60 Minutes Intravenous  Once 03/14/18 1512 03/14/18 1808       Assessment/Plan Rheumatoid arthritis, on Humira, Plaquenil, and methotrexate Nonischemic cardiomyopathy with an EF now up to 40-45% Hypertension Acute kidney injury Hx diabetes mellitus-currently on no treatment for this. Obesity  Sigmoid diverticulitis with 5.4 x 4.4 cm pre-rectal abscess/fluid collection -IRplaced TG drain. CX shows WBC with rare gram + cocci in pairs and chains. Await final CX, but prelim shows STREP VIRIDANS -cont abx therapy,WBC up today to 14K from today, but AF and still feels well -cont IV abx therapy today and recheck CBC in am to make sure WBC isn't continuing to go the wrong direction.  Tomorrow will be about 6 days since drain placement.  May need to repeat scan tomorrow, especially prior to discharge if she improves given minimal drain output.  FEN -IVFs/low residue VTE -SCDs/Lovenox ID -Cipro/Flagyl 12/3 -->   LOS:  6 days    Letha Cape , [facility] Surgery 03/20/2018, 8:23 AM Pager: [phone]

## 2018-03-20 NOTE — Progress Notes (Signed)
PROGRESS NOTE    [name]  ZRA:[id number] DOB: 09-04-1970 DOA: 03/14/2018 PCP: Shirlean Mylar, MD  Brief Narrative:  HPI per Dr. Osvaldo Shipper on 03/14/18 [name] is a 47 y.o. female with a past medical history of chronic systolic CHF with ejection fraction of 40 to 45% based on echocardiogram from October, history of rheumatoid arthritis followed by rheumatology in [facility] on multiple immunosuppressants, history of diabetes mellitus type 2, history of asthma who developed yeast infection in her lower abdomen and in the groin area for which she has been taking antifungal tablet as well as an antifungal cream.  This has been ongoing for about 2 weeks.  She has had a lot of burning sensation in the lower part at the site of these lesions.  She tells me that with treatment the lesions have been improving.  And then about 3 days ago she started developing pain in the lower abdomen which is slightly different.  She had fever and chills last night although she did not check her temperature.  The pain was 8 out of 10 in intensity.  She had nausea and vomiting x1.  She had a loose stool yesterday.  She has had poor oral intake.  In the emergency department she underwent a CT scan which revealed sigmoid diverticulitis with fluid collection suspicious for abscess.  She was hospitalized for further management.  General surgery was consulted.  Urine pregnancy test was negative.  Last menstrual period was 12th of November.  **She is status post CT drain placement via transgluteal approach on the left side for her pelvic abscess.  Her main complaint now lower abdominal pain. Pain medications are now attempting to be weaned and her Diuretics have been slowly started to be added back and she was placed back on po Lasix. She got a dose of IV Ketorlac and patient states it has helped with her pain so she will get another round today of 15 mg IV q12hprn x2 doses. Surgery and IR recommending repeating a CT Scan  in AM   Assessment & Plan:   Principal Problem:   Perforation of sigmoid colon due to diverticulitis Active Problems:   Rheumatoid arthritis (HCC)   Chronic systolic heart failure (HCC)   Abscess of sigmoid colon due to diverticulitis s/p perc drainage 03/15/2018  Acute Sigmoid Diverticulitis with Abscess status post percutaneous drain placement via transgluteal approach, improving  -CT of the abdomen pelvis with contrast showed sigmoid diverticulitis resulting in secondary inflammation and thickening of the adjacent small bowel loops.  There is a 5.2 x 4.4 cm probable abscess in the perirectal space -General surgery has been consulted and they recommend broad-spectrum IV antibiotic and percutaneous drainage of abscess.  IR was consulted  and patient underwent percutaneous drain placement by Dr. Denny Levy on 03/15/18 -General surgery recommending continue IV antibiotic therapy for now but recommending advancing diet -Diet is now advanced to Soft Diet  -Patient was placed on IV Ciprofloxacin and Metronidazole and we will continue these for now and change to po soon.  She is allergic to penicillin.   -Due to concern for yeast infection she will be placed on Diflucan as well IV but this has been changed to po   -Her WBC is noted to be elevated from infection and WBC peaked to 24,100  and slightly up from 22,500 on admission; Now was trending down to 10,700 but slight bump up to 14,700 -She is afebrile but has slightly elevated heart rate.  No  clear sepsis is identified at this time.  Otherwise she is hemodynamically stable.   -Blood cultures x2 show NGTD at 5 Days -IR placed drain and aspirated 13 cc of fluid that was sent off for Analysis; Aspirate Gram stain showed few WBCs both PMNs of mononuclear with rare gram-positive cocci in pairs and chains. Cx grew out Few Viridians Streptococcus. IR recommending continued every shift flushing and monitoring of output -Appreciate further General surgery  recommendations. -Pain control with IV morphine 2 mg to 3 PRN chagned to 1 mg q 4PRN for severe pain along with methocarbamol 500 mg p.o. twice daily PRN for muscle spasm -Patient still has no appetite and still complaining of some pain but not as bad  -Will try IV Ketorolac 15 mg q12hprn x2 doses today -Repeating CT Abd/Pelvis tomorrow    Back Pain -Likely in the setting of where her drain is placed -Added Lidoderm patch and diclofenac -Continue with IV morphine but increased frequency from 2 mg every 4 hours as needed severe pain to every 3 hours as needed yesterday but will scale back to 1 mg q4h PRN and will continue po Tramadol 40 mg po 4 times Daily PRN -Continue with methocarbamol 500 mg p.o. twice daily PRN for muscle spasms -Gave 1x of IV Ketorolac 30 mg daily for the last 2 days and will give IV Ketorolac 15 mg q12hprn x2 doses today  Yeast Infection -She has skin lesions in the lower abdomen and in the groin area.   -This is been treated by her primary care provider with antifungal agents.  -C/w Topical Antifungal Agents with Clotrimazole 1 application topically BID -C/w Fluconazole 100 mg IV q24h and change to po now that diet has been advanced.   Chronic Systolic CHF -EF noted to be 40 to 45% based on last echocardiogram done in October.   -She is followed by the heart failure clinic.   -Her elevated BUN and creatinine suggest a degree of hypovolemia.  -She was gently hydrated and now IVF has stopped. -Resumed her Home Lasix dosing yesterday but was not given for some reason and patient refused to take it today  -Resumed Entresto today as BP is stable but on the lower side at 128.56 -C/w with Metoprolol Succinate 50 mg po Daily and Isosorbide-Hydralazine 20-37.5 mg po TID  -Strict I's and O's, Daily Weights -Patient is +10,071 mL since admission and Weight was down 4 lbs but now back up to 233 -If there is a change in her hemodynamics these medications may have to be  stopped. Currently holding her Spironolactone again due to lower BP -May need to consult Cardiology if there is need for surgical intervention.  Mild Acute Renal Failure, improved  -BUN and creatinine noted to be higher than her usual baseline and was 40/1.21 on Admission.   -Given Gentle IVF hydration for 12 hours and now discontinued. -Avoid Nephrotoxic medications if possible; Resumed Home Entresto and Lasix was resumed today; Continue to hold Spironolactone currently -Monitor urine output. -BUN/Cr has now improved and is 15/0.70 -Will try IV Ketorolac 15 mg q12hprn x2 doses today and monitor closely now that she is back on Lasix and Entresto -Repeat CMP in AM   Diabetes Mellitus Type 2 -Continue to Hold  Home Metformin for now.   -HbA1c was checked and 6.2 -CBGs have been ranging from 97-153 -Continue to Monitor CBG's closely   Rheumatoid Arthritis -Patient on multiple immunosuppressants.   -Has not been on steroids in the last 6 months.  She takes steroids only as needed for flareups.   -She is noted to be on Methotrexate and Hydroxychloroquine.   -She gets Humira injections but has not received them in a few weeks.   -We will hold all of her Immunosuppressants for now given infection.  -She is followed by Rheumatology in [facility] and will need outpatient follow up at D/C. -Will try IV Ketorolac 15 mg q12hprn x2 doses today   Normocytic Anemia -Anemia panel done and showed an iron level of 10, U IBC of 255, TIBC of 265, saturation ratios of 4%, ferritin level 460, folate level 20.2, and vitamin B12 level 118 -Hemoglobin lower than her baseline on admission. -Now Hb/Hct went from 7.5/24.9 yesterday and is now 7.8/26.3 -Will not start IV iron in the setting of active infection -No evidence of overt bleeding currently and will continue to Monitor for S/Sx of Bleeding -Repeat CBC in AM   Morbid Obesity -Estimated body mass index is 44.11 kg/m as calculated from the  following:   Height as of this encounter: 5\' 1"  (1.549 m).   Weight as of this encounter: 105.9 kg.  -Weight loss Counseling given   Hypokalemia -Patient potassium is4.5 -Replete with p.o. potassium quite 40 mcg twice daily x2 doses yesterday  -Continue monitor replete as necessary -Repeat CMP in a.m.  Hypomagnesemia -Patient magnesium level this point is 1.9 -Continue to Monitor and replete as necessary -Repeat magnesium level in the a.m.  Hypophosphatemia -Patient's Phos Level was 2.2 -Replete with po K-Phos Neutral 500 mg po BID x2 Doses -Continue to Monitor and Replete as Necessary -Repeat Phos Level in AM   DVT prophylaxis: SCDs Code Status: FULL CODE Family Communication: No family present at bedside Disposition Plan: Pending improvement of Diverticulitis and improvement of Abdominal Pain; Tolerance of Diet; Repeat CT Scan being Stable   Consultants:   General Surgery  Interventional Radiology   Procedures:   S/p CT Guided Left Transgluteal Drain placement with 13 cc of Fluid Aspirated    Antimicrobials:  Anti-infectives (From admission, onward)   Start     Dose/Rate Route Frequency Ordered Stop   03/18/18 1000  valACYclovir (VALTREX) tablet 500 mg     500 mg Oral 2 times daily 03/18/18 0806     03/17/18 1830  fluconazole (DIFLUCAN) tablet 100 mg     100 mg Oral Daily 03/17/18 1716     03/15/18 0400  ciprofloxacin (CIPRO) IVPB 400 mg     400 mg 200 mL/hr over 60 Minutes Intravenous Every 12 hours 03/14/18 1722     03/14/18 1830  metroNIDAZOLE (FLAGYL) IVPB 500 mg     500 mg 100 mL/hr over 60 Minutes Intravenous Every 8 hours 03/14/18 1722     03/14/18 1830  fluconazole (DIFLUCAN) IVPB 100 mg  Status:  Discontinued     100 mg 50 mL/hr over 60 Minutes Intravenous Every 24 hours 03/14/18 1742 03/17/18 1716   03/14/18 1515  ciprofloxacin (CIPRO) IVPB 400 mg     400 mg 200 mL/hr over 60 Minutes Intravenous  Once 03/14/18 1512 03/14/18 1716   03/14/18 1515   metroNIDAZOLE (FLAGYL) IVPB 500 mg  Status:  Discontinued     500 mg 100 mL/hr over 60 Minutes Intravenous  Once 03/14/18 1512 03/14/18 1808     Subjective: Seen and Examined at bedside points of pain but states that ketorolac works very well for her.  No chest pain, lightheadedness or dizziness.  But states that she is having a lot of  reflux symptoms and it causes her some abdominal discomfort because of it.  Abdominal pain has improved and she states there is a 4 out of 10 today and she states that if it is under for her tramadol helps but if it is not the tramadol does not touch her pain.  No other concerns or complaints at this time and is tolerating a soft diet.  Surgery and interventional radiology to repeat a CT scan of the abdomen and pelvis tomorrow to reevaluate the patient is agreeable to plan.  Objective: Vitals:   03/19/18 1335 03/19/18 2044 03/20/18 0419 03/20/18 1410  BP: 106/60 106/61 123/67 107/65  Pulse: 96 90 96 91  Resp: 16 14 14 17   Temp: 98 F (36.7 C) 99.3 F (37.4 C) 99.6 F (37.6 C) 98.9 F (37.2 C)  TempSrc: Oral Oral Oral Oral  SpO2: 99% 98% 96% 100%  Weight:   105.9 kg   Height:        Intake/Output Summary (Last 24 hours) at 03/20/2018 1833 Last data filed at 03/20/2018 1230 Gross per 24 hour  Intake 624.75 ml  Output 15 ml  Net 609.75 ml   Filed Weights   03/16/18 0105 03/18/18 0416 03/20/18 0419  Weight: 106 kg 104.1 kg 105.9 kg   Examination: Physical Exam:  Constitutional:_Well-developed morbidly obese African [place] female currently no acute distress sitting up in chair at bedside and appears more comfortable today but still complaining of some abdominal pain and rates it a 4 out of 10 in severity Eyes: Conjunctive are normal.  Sclera nonicteric ENMT: External ears and nose appear normal.  Grossly normal Neck: Appears supple with no JVD Respiratory: Slightly diminished to auscultation bilaterally no appreciable wheezing, rales, rhonchi.   Patient is not tachypneic wheezing and accessory muscle breathe Cardiovascular: The rate and rhythm.  Has 1+ lower extremity edema today Abdomen: Soft, not tender to palpate.  Bowel sounds present.  Distended secondary body habitus. GU: Deferred Musculoskeletal: No contractures or cyanosis.  No joint deformities noted Skin: Warm and dry. Neurologic: Cranial nerves II through XII grossly intact no appreciable focal deficits Psychiatric: Normal mood and affect.  Intact judgment and insight.  Patient is awake, alert, and oriented x3  Data Reviewed: I have personally reviewed following labs and imaging studies  CBC: Recent Labs  Lab 03/16/18 0629 03/17/18 0850 03/18/18 0849 03/19/18 0545 03/20/18 0516  WBC 19.5* 15.8* 12.3* 10.7* 14.7*  NEUTROABS 15.2* 11.1* 9.0* 7.1 10.8*  HGB 7.8* 8.4* 8.6* 7.5* 7.8*  HCT 25.8* 27.8* 28.7* 24.9* 26.3*  MCV 95.2 96.5 95.7 96.1 98.1  PLT 351 371 512* 514* 565*   Basic Metabolic Panel: Recent Labs  Lab 03/16/18 0629 03/17/18 0850 03/18/18 0849 03/19/18 0545 03/20/18 0516  NA 136 140 139 139 139  K 4.0 3.8 3.5 3.4* 4.5  CL 109 109 110 109 113*  CO2 19* 21* 21* 20* 18*  GLUCOSE 144* 101* 126* 145* 135*  BUN 28* 23* 23* 23* 15  CREATININE 0.85 0.73 0.80 0.91 0.70  CALCIUM 8.3* 8.8* 8.8* 8.2* 8.2*  MG 2.2 2.2 1.9 1.6* 1.9  PHOS 3.0 3.2 3.3 3.1 2.2*   GFR: Estimated Creatinine Clearance: 99.5 mL/min (by C-G formula based on SCr of 0.7 mg/dL). Liver Function Tests: Recent Labs  Lab 03/15/18 0519 03/16/18 0629 03/17/18 0850 03/18/18 0849 03/19/18 0545  AST 16 13* 16 18 19   ALT 16 14 15 14 14   ALKPHOS 72 72 94 97 85  BILITOT 0.9 1.0 0.5 0.3  0.6  PROT 7.9 7.5 8.0 8.0 6.8  ALBUMIN 3.3* 3.1* 3.4* 3.2* 2.8*   Recent Labs  Lab 03/14/18 1319  LIPASE 30   No results for input(s): AMMONIA in the last 168 hours. Coagulation Profile: Recent Labs  Lab 03/15/18 0519  INR 0.99   Cardiac Enzymes: No results for input(s): CKTOTAL, CKMB,  CKMBINDEX, TROPONINI in the last 168 hours. BNP (last 3 results) No results for input(s): PROBNP in the last 8760 hours. HbA1C: No results for input(s): HGBA1C in the last 72 hours. CBG: Recent Labs  Lab 03/19/18 2356 03/20/18 0422 03/20/18 0758 03/20/18 1207 03/20/18 1611  GLUCAP 107* 125* 119* 153* 97   Lipid Profile: No results for input(s): CHOL, HDL, LDLCALC, TRIG, CHOLHDL, LDLDIRECT in the last 72 hours. Thyroid Function Tests: No results for input(s): TSH, T4TOTAL, FREET4, T3FREE, THYROIDAB in the last 72 hours. Anemia Panel: No results for input(s): VITAMINB12, FOLATE, FERRITIN, TIBC, IRON, RETICCTPCT in the last 72 hours. Sepsis Labs: No results for input(s): PROCALCITON, LATICACIDVEN in the last 168 hours.  Recent Results (from the past 240 hour(s))  Culture, blood (Routine X 2) w Reflex to ID Panel     Status: None   Collection Time: 03/14/18  7:12 PM  Result Value Ref Range Status   Specimen Description   Final    BLOOD RIGHT ARM Performed at [facility], [address] [place], [place] [address]    Special Requests   Final    BOTTLES DRAWN AEROBIC AND ANAEROBIC Blood Culture adequate volume Performed at [facility], [address] [place], [place] [address]    Culture   Final    NO GROWTH 5 DAYS Performed at [facility], [address], [place], [place] [address]    Report Status 03/19/2018 FINAL  Final  Culture, blood (Routine X 2) w Reflex to ID Panel     Status: None   Collection Time: 03/14/18  7:12 PM  Result Value Ref Range Status   Specimen Description   Final    BLOOD RIGHT ARM Performed at [facility], [address] [place], [place] [address]    Special Requests   Final    BOTTLES DRAWN AEROBIC AND ANAEROBIC Blood Culture adequate volume Performed at [facility], [address], [place], [place] [address]    Culture   Final    NO GROWTH 5  DAYS Performed at [facility], [address] [place], [place] [address]    Report Status 03/19/2018 FINAL  Final  MRSA PCR Screening     Status: None   Collection Time: 03/15/18  6:36 AM  Result Value Ref Range Status   MRSA by PCR NEGATIVE NEGATIVE Final    Comment:        The GeneXpert MRSA Assay (FDA approved for NASAL specimens only), is one component of a comprehensive MRSA colonization surveillance program. It is not intended to diagnose MRSA infection nor to guide or monitor treatment for MRSA infections. Performed at [facility], [address] [place], [place] [address]   Aerobic/Anaerobic Culture (surgical/deep wound)     Status: None   Collection Time: 03/15/18 11:51 AM  Result Value Ref Range Status   Specimen Description   Final    ABSCESS LEFT BUTTOCKS Performed at [facility], [address] [place], [place] [address]    Special Requests   Final    Normal Performed at [facility], [address], [place], [place] [address]    Gram Stain   Final    FEW WBC PRESENT,BOTH PMN AND MONONUCLEAR RARE GRAM POSITIVE COCCI IN PAIRS AND CHAINS    Culture   Final    FEW VIRIDANS STREPTOCOCCUS NO ANAEROBES ISOLATED Performed at [facility], [address] [place], [place] [address]    Report Status 03/20/2018 FINAL  Final       Radiology Studies: No results found. Scheduled Meds: . calcium carbonate  1 tablet Oral BID  . cetirizine  10 mg Oral QHS  . clotrimazole  1 application Topical BID  . diclofenac sodium  2 g Topical QID  . docusate sodium  100 mg Oral Daily  . enoxaparin (LOVENOX) injection  40 mg Subcutaneous Q24H  . famotidine  20 mg Oral BID  . fluconazole  100 mg Oral Daily  . folic acid  1 mg Oral Daily  . furosemide  120 mg Oral Daily  . insulin aspart  0-9 Units Subcutaneous Q4H  . isosorbide-hydrALAZINE  2 tablet Oral TID  . lidocaine  1 patch Transdermal Q24H  .  metoprolol succinate  50 mg Oral Daily  . mometasone-formoterol  2 puff Inhalation BID  . pantoprazole  40 mg Oral Daily  . sacubitril-valsartan  1 tablet Oral BID  . sodium chloride flush  3 mL Intravenous Q12H  . sodium chloride flush  5 mL Intracatheter Q8H  . valACYclovir  500 mg Oral BID   Continuous Infusions: . sodium chloride 10 mL/hr at 03/19/18 1800  . ciprofloxacin 400 mg (03/20/18 1612)  . metronidazole 500 mg (03/20/18 1234)    LOS: 6 days   Merlene Laughter, DO [facility] PAGER is on AMION  If 7PM-7AM, please contact night-coverage [email or web address] Password [facility] 03/20/2018, 6:33 PM

## 2018-03-20 NOTE — Care Management Important Message (Signed)
Important Message  Patient Details  Name: [name] MRN: [id number] Date of Birth: 1971/04/06   Medicare Important Message Given:  Yes    Caren Macadam 03/20/2018, 9:59 AMImportant Message  Patient Details  Name: [name] MRN: [id number] Date of Birth: 1970/11/01   Medicare Important Message Given:  Yes    Caren Macadam 03/20/2018, 9:59 AM

## 2018-03-20 NOTE — Progress Notes (Signed)
Patient refused to take her po Lasix. Dr Marland Mcalpine notified.

## 2018-03-21 ENCOUNTER — Inpatient Hospital Stay (HOSPITAL_COMMUNITY): Payer: PPO

## 2018-03-21 LAB — CBC WITH DIFFERENTIAL/PLATELET
Abs Immature Granulocytes: 0.18 10*3/uL — ABNORMAL HIGH (ref 0.00–0.07)
Basophils Absolute: 0 10*3/uL (ref 0.0–0.1)
Basophils Relative: 0 %
Eosinophils Absolute: 0.1 10*3/uL (ref 0.0–0.5)
Eosinophils Relative: 1 %
HEMATOCRIT: 24.8 % — AB (ref 36.0–46.0)
Hemoglobin: 7.5 g/dL — ABNORMAL LOW (ref 12.0–15.0)
IMMATURE GRANULOCYTES: 1 %
LYMPHS ABS: 2.2 10*3/uL (ref 0.7–4.0)
Lymphocytes Relative: 14 %
MCH: 29.1 pg (ref 26.0–34.0)
MCHC: 30.2 g/dL (ref 30.0–36.0)
MCV: 96.1 fL (ref 80.0–100.0)
Monocytes Absolute: 1.4 10*3/uL — ABNORMAL HIGH (ref 0.1–1.0)
Monocytes Relative: 9 %
Neutro Abs: 11.1 10*3/uL — ABNORMAL HIGH (ref 1.7–7.7)
Neutrophils Relative %: 75 %
Platelets: 571 10*3/uL — ABNORMAL HIGH (ref 150–400)
RBC: 2.58 MIL/uL — ABNORMAL LOW (ref 3.87–5.11)
RDW: 17.5 % — ABNORMAL HIGH (ref 11.5–15.5)
WBC: 14.9 10*3/uL — ABNORMAL HIGH (ref 4.0–10.5)
nRBC: 0 % (ref 0.0–0.2)

## 2018-03-21 LAB — COMPREHENSIVE METABOLIC PANEL
ALT: 18 U/L (ref 0–44)
AST: 30 U/L (ref 15–41)
Albumin: 3 g/dL — ABNORMAL LOW (ref 3.5–5.0)
Alkaline Phosphatase: 91 U/L (ref 38–126)
Anion gap: 7 (ref 5–15)
BUN: 13 mg/dL (ref 6–20)
CO2: 20 mmol/L — ABNORMAL LOW (ref 22–32)
Calcium: 8.3 mg/dL — ABNORMAL LOW (ref 8.9–10.3)
Chloride: 111 mmol/L (ref 98–111)
Creatinine, Ser: 0.8 mg/dL (ref 0.44–1.00)
GFR calc Af Amer: >60 mL/min (ref >60)
GFR calc non Af Amer: >60 mL/min (ref >60)
GLUCOSE: 114 mg/dL — AB (ref 70–99)
Potassium: 3.8 mmol/L (ref 3.5–5.1)
Sodium: 138 mmol/L (ref 135–145)
Total Bilirubin: 0.5 mg/dL (ref 0.3–1.2)
Total Protein: 7 g/dL (ref 6.5–8.1)

## 2018-03-21 LAB — GLUCOSE, CAPILLARY
Glucose-Capillary: 94 mg/dL (ref 70–99)
Glucose-Capillary: 85 mg/dL (ref 70–99)
Glucose-Capillary: 138 mg/dL — ABNORMAL HIGH (ref 70–99)
Glucose-Capillary: 147 mg/dL — ABNORMAL HIGH (ref 70–99)
Glucose-Capillary: 124 mg/dL — ABNORMAL HIGH (ref 70–99)

## 2018-03-21 LAB — PHOSPHORUS: Phosphorus: 3.2 mg/dL (ref 2.5–4.6)

## 2018-03-21 LAB — MAGNESIUM: Magnesium: 1.7 mg/dL (ref 1.7–2.4)

## 2018-03-21 MED ORDER — IOHEXOL 300 MG/ML  SOLN
100.0000 mL | Freq: Once | INTRAMUSCULAR | Status: AC | PRN
  Administered 2018-03-21: 100 mL via INTRAVENOUS

## 2018-03-21 MED ORDER — SODIUM CHLORIDE (PF) 0.9 % IJ SOLN
INTRAMUSCULAR | Status: AC
  Filled 2018-03-21: qty 50

## 2018-03-21 MED ORDER — IOPAMIDOL (ISOVUE-300) INJECTION 61%: 100.0000 mL | Freq: Once | INTRAVENOUS | Status: DC | PRN

## 2018-03-21 NOTE — Progress Notes (Signed)
PROGRESS NOTE    [name]  YQM:[id number] DOB: 12-17-1970 DOA: 03/14/2018 PCP: Shirlean Mylar, MD  Brief Narrative:  HPI per Dr. Osvaldo Shipper on 03/14/18 [name] is a 47 y.o. female with a past medical history of chronic systolic CHF with ejection fraction of 40 to 45% based on echocardiogram from October, history of rheumatoid arthritis followed by rheumatology in [facility] on multiple immunosuppressants, history of diabetes mellitus type 2, history of asthma who developed yeast infection in her lower abdomen and in the groin area for which she has been taking antifungal tablet as well as an antifungal cream.  This has been ongoing for about 2 weeks.  She has had a lot of burning sensation in the lower part at the site of these lesions.  She tells me that with treatment the lesions have been improving.  And then about 3 days ago she started developing pain in the lower abdomen which is slightly different.  She had fever and chills last night although she did not check her temperature.  The pain was 8 out of 10 in intensity.  She had nausea and vomiting x1.  She had a loose stool yesterday.  She has had poor oral intake.  In the emergency department she underwent a CT scan which revealed sigmoid diverticulitis with fluid collection suspicious for abscess.  She was hospitalized for further management.  General surgery was consulted.  Urine pregnancy test was negative.  Last menstrual period was 12th of November.  **She is status post CT drain placement via transgluteal approach on the left side for her pelvic abscess.  Her main complaint now lower abdominal pain. Pain medications are now attempting to be weaned and her Diuretics have been slowly started to be added back and she was placed back on po Lasix. She got a dose of IV Ketorlac and patient states it has helped with her pain so she will get another round today of 15 mg IV q12hprn x2 doses. Surgery and IR recommending repeating a CT Scan  and it its to be done today.  Assessment & Plan:   Principal Problem:   Perforation of sigmoid colon due to diverticulitis Active Problems:   Rheumatoid arthritis (HCC)   Chronic systolic heart failure (HCC)   Abscess of sigmoid colon due to diverticulitis s/p perc drainage 03/15/2018  Acute Sigmoid Diverticulitis with Abscess status post percutaneous drain placement via transgluteal approach, improving  -CT of the abdomen pelvis with contrast showed sigmoid diverticulitis resulting in secondary inflammation and thickening of the adjacent small bowel loops.  There is a 5.2 x 4.4 cm probable abscess in the perirectal space -General surgery has been consulted and they recommend broad-spectrum IV antibiotic and percutaneous drainage of abscess.  IR was consulted  and patient underwent percutaneous drain placement by Dr. Denny Levy on 03/15/18 -General surgery recommending continue IV antibiotic therapy for now but recommending advancing diet -Diet is now advanced to Soft Diet and recommending Diet as Tolerated.  -Patient has been vomiting some today  -Patient was placed on IV Ciprofloxacin and Metronidazole and we will continue these for now and change to po soon.  She is allergic to penicillin.   -Due to concern for yeast infection she will be placed on Diflucan as well IV but this has been changed to po   -Her WBC is noted to be elevated from infection and WBC peaked to 24,100  and slightly up from 22,500 on admission; Now was trending down to 10,700 but  slight bump up to 14,900 -She is afebrile but has slightly elevated heart rate.  No clear sepsis is identified at this time.  Otherwise she is hemodynamically stable.   -Blood cultures x2 show NGTD at 5 Days -IR placed drain and aspirated 13 cc of fluid that was sent off for Analysis; Aspirate Gram stain showed few WBCs both PMNs of mononuclear with rare gram-positive cocci in pairs and chains. Cx grew out Few Viridians Streptococcus. IR recommending  continued every shift flushing and monitoring of output -Appreciate further General surgery recommendations. -Pain control with IV morphine 2 mg to 3 PRN chagned to 1 mg q 4PRN for severe pain along with methocarbamol 500 mg p.o. twice daily PRN for muscle spasm -Patient still has no appetite and still complaining of some pain but not as bad  -Tried  IV Ketorolac 15 mg q12hprn x2 doses yesterday but will hold until CT Abdomen done -Repeating CT Abd/Pelvis today  Back Pain -Likely in the setting of where her drain is placed -Added Lidoderm patch and diclofenac -Continue with IV morphine but increased frequency from 2 mg every 4 hours as needed severe pain to every 3 hours as needed yesterday but will scale back to 1 mg q4h PRN and will continue po Tramadol 40 mg po 4 times Daily PRN -Continue with methocarbamol 500 mg p.o. twice daily PRN for muscle spasms -Gave 1x of IV Ketorolac 30 mg daily for the last few days and IV Ketorolac 15 mg q12hprn x2 doses yesterday but will hold today  Yeast Infection -She has skin lesions in the lower abdomen and in the groin area.   -This is been treated by her primary care provider with antifungal agents.  -C/w Topical Antifungal Agents with Clotrimazole 1 application topically BID -C/w Fluconazole 100 mg IV q24h and change to po now that diet has been advanced. She is on Day 8 of Fluconazole  Chronic Systolic CHF -EF noted to be 40 to 45% based on last echocardiogram done in October.   -She is followed by the heart failure clinic.   -Her elevated BUN and creatinine suggest a degree of hypovolemia.  -She was gently hydrated and now IVF has stopped. -Resumed her Home Lasix dosing yesterday but was not given for some reason and patient refused to take it today  -Resumed Entresto but BP remains on the lower side at 106/59 -C/w with Metoprolol Succinate 50 mg po Daily and Isosorbide-Hydralazine 20-37.5 mg po TID  -Strict I's and O's, Daily Weights -Patient  is +10,797 mL since admission and Weight was down 4 lbs but now back up to 233 -If there is a change in her hemodynamics these medications may have to be stopped. Currently holding her Spironolactone again due to lower BP -May need to consult Cardiology if there is need for surgical intervention.  Mild Acute Renal Failure, improved  -BUN and creatinine noted to be higher than her usual baseline and was 40/1.21 on Admission.   -Given Gentle IVF hydration for 12 hours and now discontinued. -Avoid Nephrotoxic medications if possible; Resumed Home Entresto and Lasix was resumed today; Continue to hold Spironolactone currently -Monitor urine output. -BUN/Cr has now improved and is 13/0.80 -Gave IV Ketorolac 15 mg q12hprn x2 doses yesterday and monitor closely now that she is back on Lasix and Entresto -Repeat CMP in AM   Diabetes Mellitus Type 2 -Continue to Hold  Home Metformin for now.   -HbA1c was checked and 6.2 -CBGs have been ranging from 85-147 -  Continue to Monitor CBG's closely   Rheumatoid Arthritis -Patient on multiple immunosuppressants.   -Has not been on steroids in the last 6 months.  She takes steroids only as needed for flareups.   -She is noted to be on Methotrexate and Hydroxychloroquine.   -She gets Humira injections but has not received them in a few weeks.   -We will hold all of her Immunosuppressants for now given infection.  -She is followed by Rheumatology in [facility] and will need outpatient follow up at D/C. -Will try IV Ketorolac 15 mg q12hprn x2 doses today   Normocytic Anemia -Anemia panel done and showed an iron level of 10, U IBC of 255, TIBC of 265, saturation ratios of 4%, ferritin level 460, folate level 20.2, and vitamin B12 level 118 -Hemoglobin lower than her baseline on admission. -Now Hb/Hct went from 7.5/24.9 yesterday and is now 7.5/24.8 -Will not start IV iron in the setting of active infection -No evidence of overt bleeding currently and  will continue to Monitor for S/Sx of Bleeding -Repeat CBC in AM   Morbid Obesity -Estimated body mass index is 44.11 kg/m as calculated from the following:   Height as of this encounter: 5\' 1"  (1.549 m).   Weight as of this encounter: 105.9 kg.  -Weight loss Counseling given   Hypokalemia -Patient potassium is 3.8 -Continue monitor replete as necessary -Repeat CMP in a.m.  Hypomagnesemia -Patient magnesium level today was 1.7 -Continue to Monitor and replete as necessary -Repeat magnesium level in the a.m.  Hypophosphatemia -Patient's Phos Level was 3.2 -Continue to Monitor and Replete as Necessary -Repeat Phos Level in AM   Thrombocytosis -Patient's platelet count is still given increasing is now 571 -Continue monitor on this likely reactive rectum-repeat CBC in a.m.  DVT prophylaxis: SCDs Code Status: FULL CODE Family Communication: No family present at bedside Disposition Plan: Pending improvement of Diverticulitis and improvement of Abdominal Pain; Tolerance of Diet; Repeat CT Scan being Stable   Consultants:   General Surgery  Interventional Radiology   Procedures:   S/p CT Guided Left Transgluteal Drain placement with 13 cc of Fluid Aspirated    Antimicrobials:  Anti-infectives (From admission, onward)   Start     Dose/Rate Route Frequency Ordered Stop   03/18/18 1000  valACYclovir (VALTREX) tablet 500 mg     500 mg Oral 2 times daily 03/18/18 0806     03/17/18 1830  fluconazole (DIFLUCAN) tablet 100 mg     100 mg Oral Daily 03/17/18 1716     03/15/18 0400  ciprofloxacin (CIPRO) IVPB 400 mg     400 mg 200 mL/hr over 60 Minutes Intravenous Every 12 hours 03/14/18 1722     03/14/18 1830  metroNIDAZOLE (FLAGYL) IVPB 500 mg     500 mg 100 mL/hr over 60 Minutes Intravenous Every 8 hours 03/14/18 1722     03/14/18 1830  fluconazole (DIFLUCAN) IVPB 100 mg  Status:  Discontinued     100 mg 50 mL/hr over 60 Minutes Intravenous Every 24 hours 03/14/18 1742  03/17/18 1716   03/14/18 1515  ciprofloxacin (CIPRO) IVPB 400 mg     400 mg 200 mL/hr over 60 Minutes Intravenous  Once 03/14/18 1512 03/14/18 1716   03/14/18 1515  metroNIDAZOLE (FLAGYL) IVPB 500 mg  Status:  Discontinued     500 mg 100 mL/hr over 60 Minutes Intravenous  Once 03/14/18 1512 03/14/18 1808     Subjective: Seen and Examined at bedside and states that she was  in a lot of pain today and started actually having some vomiting.  States that the food did not taste good and makes her stomach hurt.  No chest pain, lightheadedness or dizziness.  No other concerns or complaints this time except abdominal pain and some bilious yellowish vomiting..  Objective: Vitals:   03/20/18 1410 03/20/18 2113 03/21/18 0544 03/21/18 1253  BP: 107/65 (!) 111/55 109/66 (!) 106/59  Pulse: 91 92 85 97  Resp: 17 16 16 16   Temp: 98.9 F (37.2 C) 99.6 F (37.6 C) 97.8 F (36.6 C) 99 F (37.2 C)  TempSrc: Oral Oral Oral Oral  SpO2: 100% 100% 100% 100%  Weight:      Height:        Intake/Output Summary (Last 24 hours) at 03/21/2018 1809 Last data filed at 03/21/2018 1252 Gross per 24 hour  Intake 735.89 ml  Output 10 ml  Net 725.89 ml   Filed Weights   03/16/18 0105 03/18/18 0416 03/20/18 0419  Weight: 106 kg 104.1 kg 105.9 kg   Examination: Physical Exam:  Constitutional: Well-nourished, well-developed morbidly obese African-American female currently sitting in chair and complaining of some abdominal pain.  Also states that she been vomiting today. Eyes: Lids and conjunctive are normal.  Sclera anicteric ENMT: External ears and nose appear normal.  Grossly normal hearing Neck: Appears supple no JVD Respiratory: Diminished auscultation bilaterally no active wheezing, rales, Geiple patient not tachypneic wheezing accessory muscle breathing Cardiovascular: Regular rate and rhythm.  Has trace to 1+ lower extremity edema Abdomen: Soft, tender to palpate.  Backslash present.  Distended  secondary body habitus. GU: Deferred Musculoskeletal: No contractures or cyanosis.  No joint deformities noted Skin: Skin is warm and dry; has a drain coming out from the left buttock Neurologic: Cranial nerves II through XII grossly tight noticeable focal deficits Psychiatric: Very anxious mood and affect.  Patient is awake, alert, and oriented x3.  Intact judgment and insight.  Data Reviewed: I have personally reviewed following labs and imaging studies  CBC: Recent Labs  Lab 03/17/18 0850 03/18/18 0849 03/19/18 0545 03/20/18 0516 03/21/18 0351  WBC 15.8* 12.3* 10.7* 14.7* 14.9*  NEUTROABS 11.1* 9.0* 7.1 10.8* 11.1*  HGB 8.4* 8.6* 7.5* 7.8* 7.5*  HCT 27.8* 28.7* 24.9* 26.3* 24.8*  MCV 96.5 95.7 96.1 98.1 96.1  PLT 371 512* 514* 565* 571*   Basic Metabolic Panel: Recent Labs  Lab 03/17/18 0850 03/18/18 0849 03/19/18 0545 03/20/18 0516 03/21/18 0351  NA 140 139 139 139 138  K 3.8 3.5 3.4* 4.5 3.8  CL 109 110 109 113* 111  CO2 21* 21* 20* 18* 20*  GLUCOSE 101* 126* 145* 135* 114*  BUN 23* 23* 23* 15 13  CREATININE 0.73 0.80 0.91 0.70 0.80  CALCIUM 8.8* 8.8* 8.2* 8.2* 8.3*  MG 2.2 1.9 1.6* 1.9 1.7  PHOS 3.2 3.3 3.1 2.2* 3.2   GFR: Estimated Creatinine Clearance: 99.5 mL/min (by C-G formula based on SCr of 0.8 mg/dL). Liver Function Tests: Recent Labs  Lab 03/16/18 0629 03/17/18 0850 03/18/18 0849 03/19/18 0545 03/21/18 0351  AST 13* 16 18 19 30   ALT 14 15 14 14 18   ALKPHOS 72 94 97 85 91  BILITOT 1.0 0.5 0.3 0.6 0.5  PROT 7.5 8.0 8.0 6.8 7.0  ALBUMIN 3.1* 3.4* 3.2* 2.8* 3.0*   No results for input(s): LIPASE, AMYLASE in the last 168 hours. No results for input(s): AMMONIA in the last 168 hours. Coagulation Profile: Recent Labs  Lab 03/15/18 [phone]  INR 0.99   Cardiac Enzymes: No results for input(s): CKTOTAL, CKMB, CKMBINDEX, TROPONINI in the last 168 hours. BNP (last 3 results) No results for input(s): PROBNP in the last 8760 hours. HbA1C: No  results for input(s): HGBA1C in the last 72 hours. CBG: Recent Labs  Lab 03/20/18 2343 03/21/18 0543 03/21/18 0737 03/21/18 1133 03/21/18 1629  GLUCAP 110* 94 85 138* 147*   Lipid Profile: No results for input(s): CHOL, HDL, LDLCALC, TRIG, CHOLHDL, LDLDIRECT in the last 72 hours. Thyroid Function Tests: No results for input(s): TSH, T4TOTAL, FREET4, T3FREE, THYROIDAB in the last 72 hours. Anemia Panel: No results for input(s): VITAMINB12, FOLATE, FERRITIN, TIBC, IRON, RETICCTPCT in the last 72 hours. Sepsis Labs: No results for input(s): PROCALCITON, LATICACIDVEN in the last 168 hours.  Recent Results (from the past 240 hour(s))  Culture, blood (Routine X 2) w Reflex to ID Panel     Status: None   Collection Time: 03/14/18  7:12 PM  Result Value Ref Range Status   Specimen Description   Final    BLOOD RIGHT ARM Performed at [facility], [address], [place], [place] [address]    Special Requests   Final    BOTTLES DRAWN AEROBIC AND ANAEROBIC Blood Culture adequate volume Performed at [facility], [address], [place], [place] [address]    Culture   Final    NO GROWTH 5 DAYS Performed at [facility], [address], [place], [place] [address]    Report Status 03/19/2018 FINAL  Final  Culture, blood (Routine X 2) w Reflex to ID Panel     Status: None   Collection Time: 03/14/18  7:12 PM  Result Value Ref Range Status   Specimen Description   Final    BLOOD RIGHT ARM Performed at [facility], [address] [place], [place] [address]    Special Requests   Final    BOTTLES DRAWN AEROBIC AND ANAEROBIC Blood Culture adequate volume Performed at [facility], [address] [place], [place] [address]    Culture   Final    NO GROWTH 5 DAYS Performed at [facility], [address], [place], [place] [address]    Report Status 03/19/2018 FINAL  Final  MRSA PCR Screening      Status: None   Collection Time: 03/15/18  6:36 AM  Result Value Ref Range Status   MRSA by PCR NEGATIVE NEGATIVE Final    Comment:        The GeneXpert MRSA Assay (FDA approved for NASAL specimens only), is one component of a comprehensive MRSA colonization surveillance program. It is not intended to diagnose MRSA infection nor to guide or monitor treatment for MRSA infections. Performed at [facility], [address], [place], [place] [address]   Aerobic/Anaerobic Culture (surgical/deep wound)     Status: None   Collection Time: 03/15/18 11:51 AM  Result Value Ref Range Status   Specimen Description   Final    ABSCESS LEFT BUTTOCKS Performed at [facility], [address], [place], [place] [address]    Special Requests   Final    Normal Performed at [facility], [address] [place], [place] [address]    Gram Stain   Final    FEW WBC PRESENT,BOTH PMN AND MONONUCLEAR RARE GRAM POSITIVE COCCI IN PAIRS AND CHAINS    Culture   Final    FEW VIRIDANS  STREPTOCOCCUS NO ANAEROBES ISOLATED Performed at [facility], [address], [place], [place] [address]    Report Status 03/20/2018 FINAL  Final       Radiology Studies: No results found. Scheduled Meds: . calcium carbonate  1 tablet Oral BID  . cetirizine  10 mg Oral QHS  . clotrimazole  1 application Topical BID  . diclofenac sodium  2 g Topical QID  . docusate sodium  100 mg Oral Daily  . enoxaparin (LOVENOX) injection  40 mg Subcutaneous Q24H  . famotidine  20 mg Oral BID  . fluconazole  100 mg Oral Daily  . folic acid  1 mg Oral Daily  . furosemide  120 mg Oral Daily  . insulin aspart  0-9 Units Subcutaneous Q4H  . isosorbide-hydrALAZINE  2 tablet Oral TID  . lidocaine  1 patch Transdermal Q24H  . metoprolol succinate  50 mg Oral Daily  . mometasone-formoterol  2 puff Inhalation BID  . pantoprazole  40 mg Oral Daily  . sacubitril-valsartan  1  tablet Oral BID  . sodium chloride (PF)      . sodium chloride flush  3 mL Intravenous Q12H  . sodium chloride flush  5 mL Intracatheter Q8H  . valACYclovir  500 mg Oral BID   Continuous Infusions: . sodium chloride 10 mL/hr at 03/19/18 1800  . ciprofloxacin 400 mg (03/21/18 0941)  . metronidazole 500 mg (03/21/18 1726)    LOS: 7 days   Merlene Laughter, DO [facility] PAGER is on AMION  If 7PM-7AM, please contact night-coverage [email or web address] Password [email or web address] 03/21/2018, 6:09 PM

## 2018-03-21 NOTE — Progress Notes (Signed)
Patient ID: [name], female   DOB: 03-May-1970, 47 y.o.   MRN: [id number]       Subjective: No new complaints.  Eating some.  Some foods don't taste good so having trouble eating a lot of food.  Abdomen feels fine.  Objective: Vital signs in last 24 hours: Temp:  [97.8 F (36.6 C)-99.6 F (37.6 C)] 97.8 F (36.6 C) (12/10 0544) Pulse Rate:  [85-92] 85 (12/10 0544) Resp:  [16-17] 16 (12/10 0544) BP: (107-111)/(55-66) 109/66 (12/10 0544) SpO2:  [100 %] 100 % (12/10 0544) Last BM Date: 03/14/18  Intake/Output from previous day: 12/09 0701 - 12/10 0700 In: 1235.9 [P.O.:500; I.V.:10; IV Piggyback:720.9] Out: 10 [Drains:10] Intake/Output this shift: No intake/output data recorded.  PE: Heart: regular Lungs: CTAB Abd: soft, essentially NT, drain with minimal serous output, obese  Lab Results:  Recent Labs    03/20/18 0516 03/21/18 0351  WBC 14.7* 14.9*  HGB 7.8* 7.5*  HCT 26.3* 24.8*  PLT 565* 571*   BMET Recent Labs    03/20/18 0516 03/21/18 0351  NA 139 138  K 4.5 3.8  CL 113* 111  CO2 18* 20*  GLUCOSE 135* 114*  BUN 15 13  CREATININE 0.70 0.80  CALCIUM 8.2* 8.3*   PT/INR No results for input(s): LABPROT, INR in the last 72 hours. CMP     Component Value Date/Time   NA 138 03/21/2018 0351   NA 142 01/06/2017 1433   K 3.8 03/21/2018 0351   CL 111 03/21/2018 0351   CO2 20 (L) 03/21/2018 0351   GLUCOSE 114 (H) 03/21/2018 0351   BUN 13 03/21/2018 0351   BUN 16 01/06/2017 1433   CREATININE 0.80 03/21/2018 0351   CALCIUM 8.3 (L) 03/21/2018 0351   PROT 7.0 03/21/2018 0351   ALBUMIN 3.0 (L) 03/21/2018 0351   AST 30 03/21/2018 0351   ALT 18 03/21/2018 0351   ALKPHOS 91 03/21/2018 0351   BILITOT 0.5 03/21/2018 0351   GFRNONAA >60 03/21/2018 0351   GFRAA >60 03/21/2018 0351   Lipase     Component Value Date/Time   LIPASE 30 03/14/2018 1319       Studies/Results: No results found.  Anti-infectives: Anti-infectives (From admission,  onward)   Start     Dose/Rate Route Frequency Ordered Stop   03/18/18 1000  valACYclovir (VALTREX) tablet 500 mg     500 mg Oral 2 times daily 03/18/18 0806     03/17/18 1830  fluconazole (DIFLUCAN) tablet 100 mg     100 mg Oral Daily 03/17/18 1716     03/15/18 0400  ciprofloxacin (CIPRO) IVPB 400 mg     400 mg 200 mL/hr over 60 Minutes Intravenous Every 12 hours 03/14/18 1722     03/14/18 1830  metroNIDAZOLE (FLAGYL) IVPB 500 mg     500 mg 100 mL/hr over 60 Minutes Intravenous Every 8 hours 03/14/18 1722     03/14/18 1830  fluconazole (DIFLUCAN) IVPB 100 mg  Status:  Discontinued     100 mg 50 mL/hr over 60 Minutes Intravenous Every 24 hours 03/14/18 1742 03/17/18 1716   03/14/18 1515  ciprofloxacin (CIPRO) IVPB 400 mg     400 mg 200 mL/hr over 60 Minutes Intravenous  Once 03/14/18 1512 03/14/18 1716   03/14/18 1515  metroNIDAZOLE (FLAGYL) IVPB 500 mg  Status:  Discontinued     500 mg 100 mL/hr over 60 Minutes Intravenous  Once 03/14/18 1512 03/14/18 1808       Assessment/Plan Rheumatoid arthritis,  on Humira, Plaquenil, and methotrexate Nonischemic cardiomyopathy with an EF now up to 40-45% Hypertension Acute kidney injury Hx diabetes mellitus-currently on no treatment for this. Obesity  Sigmoid diverticulitis with 5.4 x 4.4 cm pre-rectal abscess/fluid collection -IRplaced TG drain. CX shows WBC with rare gram + cocci in pairs and chains. Await final CX, but prelim shows STREP VIRIDANS -cont abx therapy,WBCstable at 14K today, but AF and still feels well -CT scan ordered for today.  Will await results for further plans and recommendations.  FEN -IVFs/low residue VTE -SCDs/Lovenox ID -Cipro/Flagyl 12/3 -->   LOS: 7 days    Letha Cape , [facility] Surgery 03/21/2018, 8:15 AM Pager: [phone]

## 2018-03-22 ENCOUNTER — Inpatient Hospital Stay (HOSPITAL_COMMUNITY): Payer: PPO

## 2018-03-22 DIAGNOSIS — K5720 Diverticulitis of large intestine with perforation and abscess without bleeding: Secondary | ICD-10-CM

## 2018-03-22 LAB — CBC WITH DIFFERENTIAL/PLATELET
Abs Immature Granulocytes: 0.11 10*3/uL — ABNORMAL HIGH (ref 0.00–0.07)
BASOS ABS: 0 10*3/uL (ref 0.0–0.1)
Basophils Relative: 0 %
Eosinophils Absolute: 0.1 10*3/uL (ref 0.0–0.5)
Eosinophils Relative: 1 %
HCT: 24.7 % — ABNORMAL LOW (ref 36.0–46.0)
Hemoglobin: 7.5 g/dL — ABNORMAL LOW (ref 12.0–15.0)
Immature Granulocytes: 1 %
Lymphocytes Relative: 14 %
Lymphs Abs: 2 10*3/uL (ref 0.7–4.0)
MCH: 28.8 pg (ref 26.0–34.0)
MCHC: 30.4 g/dL (ref 30.0–36.0)
MCV: 95 fL (ref 80.0–100.0)
Monocytes Absolute: 1.2 10*3/uL — ABNORMAL HIGH (ref 0.1–1.0)
Monocytes Relative: 8 %
Neutro Abs: 10.9 10*3/uL — ABNORMAL HIGH (ref 1.7–7.7)
Neutrophils Relative %: 76 %
PLATELETS: 594 10*3/uL — AB (ref 150–400)
RBC: 2.6 MIL/uL — ABNORMAL LOW (ref 3.87–5.11)
RDW: 17.2 % — ABNORMAL HIGH (ref 11.5–15.5)
WBC: 14.3 10*3/uL — ABNORMAL HIGH (ref 4.0–10.5)
nRBC: 0 % (ref 0.0–0.2)

## 2018-03-22 LAB — URINALYSIS, ROUTINE W REFLEX MICROSCOPIC
Bilirubin Urine: NEGATIVE
Glucose, UA: NEGATIVE mg/dL
KETONES UR: NEGATIVE mg/dL
Nitrite: NEGATIVE
Protein, ur: 30 mg/dL — AB
Specific Gravity, Urine: 1.019 (ref 1.005–1.030)
WBC, UA: >50 WBC/hpf — ABNORMAL HIGH (ref 0–5)
pH: 5 (ref 5.0–8.0)

## 2018-03-22 LAB — GLUCOSE, CAPILLARY
Glucose-Capillary: 100 mg/dL — ABNORMAL HIGH (ref 70–99)
Glucose-Capillary: 124 mg/dL — ABNORMAL HIGH (ref 70–99)
Glucose-Capillary: 140 mg/dL — ABNORMAL HIGH (ref 70–99)
Glucose-Capillary: 145 mg/dL — ABNORMAL HIGH (ref 70–99)
Glucose-Capillary: 158 mg/dL — ABNORMAL HIGH (ref 70–99)
Glucose-Capillary: 70 mg/dL (ref 70–99)

## 2018-03-22 LAB — COMPREHENSIVE METABOLIC PANEL
ALT: 17 U/L (ref 0–44)
ANION GAP: 8 (ref 5–15)
AST: 28 U/L (ref 15–41)
Albumin: 2.8 g/dL — ABNORMAL LOW (ref 3.5–5.0)
Alkaline Phosphatase: 88 U/L (ref 38–126)
BUN: 17 mg/dL (ref 6–20)
CO2: 22 mmol/L (ref 22–32)
CREATININE: 0.71 mg/dL (ref 0.44–1.00)
Calcium: 8.2 mg/dL — ABNORMAL LOW (ref 8.9–10.3)
Chloride: 109 mmol/L (ref 98–111)
GFR calc Af Amer: >60 mL/min (ref >60)
GFR calc non Af Amer: >60 mL/min (ref >60)
Glucose, Bld: 110 mg/dL — ABNORMAL HIGH (ref 70–99)
Potassium: 3.5 mmol/L (ref 3.5–5.1)
Sodium: 139 mmol/L (ref 135–145)
Total Bilirubin: 0.3 mg/dL (ref 0.3–1.2)
Total Protein: 6.7 g/dL (ref 6.5–8.1)

## 2018-03-22 LAB — MAGNESIUM: Magnesium: 1.5 mg/dL — ABNORMAL LOW (ref 1.7–2.4)

## 2018-03-22 LAB — PHOSPHORUS: Phosphorus: 3.3 mg/dL (ref 2.5–4.6)

## 2018-03-22 MED ORDER — LEVOFLOXACIN IN D5W 750 MG/150ML IV SOLN
750.0000 mg | INTRAVENOUS | Status: DC
  Administered 2018-03-22 – 2018-03-27 (×6): 750 mg via INTRAVENOUS
  Filled 2018-03-22 (×7): qty 150

## 2018-03-22 MED ORDER — SODIUM CHLORIDE (PF) 0.9 % IJ SOLN
INTRAMUSCULAR | Status: AC
  Filled 2018-03-22: qty 50

## 2018-03-22 MED ORDER — PROCHLORPERAZINE EDISYLATE 10 MG/2ML IJ SOLN
10.0000 mg | INTRAMUSCULAR | Status: DC | PRN
  Administered 2018-03-22: 10 mg via INTRAVENOUS
  Filled 2018-03-22: qty 2

## 2018-03-22 MED ORDER — IOHEXOL 300 MG/ML  SOLN
100.0000 mL | Freq: Once | INTRAMUSCULAR | Status: AC | PRN
  Administered 2018-03-22: 100 mL via INTRAVENOUS

## 2018-03-22 MED ORDER — ENSURE ENLIVE PO LIQD
237.0000 mL | Freq: Two times a day (BID) | ORAL | Status: DC
  Administered 2018-03-22 – 2018-03-23 (×4): 237 mL via ORAL

## 2018-03-22 MED ORDER — MAGNESIUM SULFATE 4 GM/100ML IV SOLN
4.0000 g | Freq: Once | INTRAVENOUS | Status: AC
  Administered 2018-03-22: 4 g via INTRAVENOUS
  Filled 2018-03-22: qty 100

## 2018-03-22 MED ORDER — IBUPROFEN 200 MG PO TABS
400.0000 mg | ORAL_TABLET | Freq: Three times a day (TID) | ORAL | Status: DC | PRN
  Administered 2018-03-22 – 2018-03-26 (×5): 400 mg via ORAL
  Filled 2018-03-22 (×6): qty 2

## 2018-03-22 NOTE — Progress Notes (Signed)
PROGRESS NOTE    [name]  DTO:[id number] DOB: January 23, 1971 DOA: 03/14/2018 PCP: Shirlean Mylar, MD    Brief Narrative:  HPI per Dr. Osvaldo Shipper on 03/14/18 [name] a 47 y.o.femalewith a past medical history of chronic systolic CHF with ejection fraction of 40 to 45% based on echocardiogram from October, history of rheumatoid arthritis followed by rheumatology in [facility] on multiple immunosuppressants, history of diabetes mellitus type 2, history of asthma who developed yeast infection in her lower abdomen and in the groin area for which she has been taking antifungal tablet as well as an antifungal cream. This has been ongoing for about 2 weeks. She has had a lot of burning sensation in the lower part at the site of these lesions. She tells me that with treatment the lesions have been improving. And then about 3 days ago she started developing pain in the lower abdomen which is slightly different. She had fever and chills last night although she did not check her temperature. The pain was 8 out of 10 in intensity. She had nausea and vomiting x1. She had a loose stool yesterday. She has hadpoor oral intake.  In the emergency department she underwent a CT scan which revealed sigmoid diverticulitis with fluid collection suspicious for abscess. She was hospitalized for further management. General surgery was consulted. Urine pregnancy test was negative. Last menstrual period was 12th ofNovember.  **She is status post CT drain placement via transgluteal approach on the left side for her pelvic abscess.  Her main complaint now lower abdominal pain. Pain medications are now attempting to be weaned and her Diuretics have been slowly started to be added back and she was placed back on po Lasix. She got a dose of IV Ketorlac and patient states it has helped with her pain so she will get another round today of 15 mg IV q12hprn x2 doses. Surgery and IR recommending repeating a CT  Scan and it its to be done today.   Assessment & Plan:   Principal Problem:   Perforation of sigmoid colon due to diverticulitis Active Problems:   Rheumatoid arthritis (HCC)   Chronic systolic heart failure (HCC)   Abscess of sigmoid colon due to diverticulitis s/p perc drainage 03/15/2018  Acute Sigmoid Diverticulitis with Abscess status post percutaneous drain placement via transgluteal approach, improving  -CT of the abdomen pelvis with contrast showed sigmoid diverticulitis resulting in secondary inflammation and thickening of the adjacent small bowel loops.  There is a 5.2 x 4.4 cm probable abscess in the perirectal space -General surgery has been consulted and they recommend broad-spectrum IV antibiotic and percutaneous drainage of abscess.  IR was consulted and patient underwent percutaneous drain placement by Dr. Miles Costain on 03/15/18 -General surgery recommending continue IV antibiotic therapy for now. -Patient currently on clear liquid diet per general surgery.  -With some emesis noted today is some concern from diarrhea or stool from the urine. -Patient was placed on IV Ciprofloxacin and Metronidazole and we will continue these for now and change to po soon. She is allergic to penicillin.  -Due to concern for yeast infection she will be placed on Diflucan as well IV but this has been changed to po  -Her WBC is noted to be elevated from infection and WBC peaked to 24,100 and slightly up from 22,500 on admission; Now was trending down and currently at 14.3.  -She is afebrile but has slightlyelevated heart rate.No clearsepsis is identified at this time. Otherwise she is  hemodynamically stable.  -Blood cultures x2 show NGTD at 5 Days -IR placed drain and aspirated 13 cc of fluid that was sent off for Analysis; Aspirate Gram stain showed few WBCs both PMNs of mononuclear with rare gram-positive cocci in pairs and chains. Cx grew out Few Viridians Streptococcus. IR recommending  continued every shift flushing and monitoring of output -Repeat UA with cultures and sensitivities pending. -Repeat CT abdomen and pelvis pending for further evaluation. -Pain control with IV morphine 2 mg to 3 PRN chagned to 1 mg q 4PRN for severe pain along with methocarbamol 500 mg p.o. twice daily PRN for muscle spasm -Patient diet has been changed to clear liquids per general surgery. -Continue empiric IV antibiotics. -General surgery following and appreciate input and recommendations.  Back Pain -Likely in the setting of where her drain is placed -Continue Lidoderm patch and diclofenac -Continue with IV morphine but increased frequency from 2 mg every 4 hours as needed severe pain to every 3 hours as needed yesterday but will scale back to 1 mg q4h PRN and will continue po Tramadol 40 mg po 4 times Daily PRN -Continue with methocarbamol 500 mg p.o. twice daily PRN for muscle spasms -Gave 1x of IV Ketorolac 30 mg daily for the last few days and IV Ketorolac 15 mg q12hprn.  Yeast Infection -She has skin lesions in the lower abdomen and in the groin area.  -This is been treated by her primary care provider with antifungal agents.  -C/w Topical Antifungal Agents with Clotrimazole 1 application topically BID -C/w Fluconazole 100 mg IV q24h and change to po now that diet has been advanced. She is on Day 9/10 of Fluconazole  Chronic Systolic CHF -EF noted to be 40 to 45% based on last echocardiogram done in October.  -She is followed by the heart failure clinic.  -Her elevated BUN and creatinine suggest a degree of hypovolemia.  -She was gently hydrated and now IVF has stopped. - -Lasix, Entresto, Toprol-XL, Imdur, spironolactone have been resumed.   -I's and O's not properly measured.   -Blood pressure somewhat borderline however patient currently asymptomatic and will monitor for now.   -Strict I's and O's.  Daily weights.  -May need to consult Cardiology if there is need for  surgical intervention.  Mild Acute Renal Failure, improved  -Likely secondary to prerenal azotemia.  Renal function improved with hydration.   - BUN and creatinine noted to be higher than her usual baseline and was 40/1.21 on Admission.  -Given Gentle IVF hydration for 12 hours and now discontinued. -Resumed Home Entresto, Lasix, spironolactone, Imdur. -Follow. -Monitor blood pressure closely.  Well-controlled diabetes Mellitus Type 2 -Hemoglobin A1c 6.2 on 03/15/2018.   -CBG 124 this morning.   -Patient on clear liquids per general surgery.  -Continue to hold oral hypoglycemic agents.  Rheumatoid Arthritis -Patient on multiple immunosuppressants.  -Has not been on steroids in the last 6 months. She takes steroids only as needed for flareups.  -She is noted to be on Methotrexate andHydroxychloroquine.  -She gets Humira injections but has not received them in a few weeks.  -Continue to hold immunosuppressive medications due to acute infection.  -She is followed by Rheumatology in [facility] and will need outpatient follow up at D/C.  Normocytic Anemia -Anemia panel done and showed an iron level of 10, U IBC of 255, TIBC of 265, saturation ratios of 4%, ferritin level 460, folate level 20.2, and vitamin B12 level 118 -Hemoglobin lower than her baseline on  admission. -Hemoglobin currently at 7.5.  -Hold off on IV iron at this time.   -Will likely need oral iron supplementation on discharge.   -Transfusion threshold hemoglobin less than 7.   Morbid Obesity -Estimated body mass index is 44.11 kg/m as calculated from the following:   Height as of this encounter: 5\' 1"  (1.549 m).   Weight as of this encounter: 105.9 kg.  -Weight loss Counseling given   Hypokalemia -Patient potassium is 3.5 -Replete as needed.   -Follow.    Hypomagnesemia -Magnesium level at 1.5.   -Replete magnesium to keep magnesium greater than 2.   Hypophosphatemia -Patient's Phos Level  currently at 3.3.  Thrombocytosis -Patient's platelet count increasing and currently at 594.  Likely secondary to acute infection.  -Follow.      DVT prophylaxis: Lovenox Code Status: Full Family Communication: Updated patient.  No family at bedside. Disposition Plan: Likely home when clinically improved and per general surgery.   Consultants:   General surgery: Dr. Johna Sheriff 03/14/2018  Interventional radiology: Dr. Miles Costain 03/15/2018  Procedures:  CT abdomen and pelvis 03/14/2018  CT abdomen and pelvis pending 03/22/2018  CT-guided left transgluteal pelvic abscess drain insertion per Dr. Miles Costain 03/15/2018  Antimicrobials:   Ciprofloxacin 03/15/2018>>>>>> 03/22/2018  IV Levaquin 03/22/2018   Subjective: Patient sitting up in chair.  States had a bout of emesis this morning and diarrhea which she seems to be describing that he came from her urine.  Patient states abdominal pain somewhat improved since this morning.  Tolerating current clear liquid diet.  Objective: Vitals:   03/21/18 1253 03/21/18 2239 03/22/18 0543 03/22/18 1023  BP: (!) 106/59 (!) 101/53 119/66 (!) 95/54  Pulse: 97 (!) 102 94 87  Resp: 16 18 16    Temp: 99 F (37.2 C) 98.2 F (36.8 C) 98.7 F (37.1 C)   TempSrc: Oral Oral Oral   SpO2: 100% 99% 98%   Weight:      Height:        Intake/Output Summary (Last 24 hours) at 03/22/2018 1320 Last data filed at 03/21/2018 2132 Gross per 24 hour  Intake 5 ml  Output -  Net 5 ml   Filed Weights   03/16/18 0105 03/18/18 0416 03/20/18 0419  Weight: 106 kg 104.1 kg 105.9 kg    Examination:  General exam: Appears calm and comfortable  Respiratory system: Clear to auscultation. Respiratory effort normal. Cardiovascular system: S1 & S2 heard, RRR. No JVD, murmurs, rubs, gallops or clicks. No pedal edema. Gastrointestinal system: Abdomen is nondistended, soft and nontender. No organomegaly or masses felt. Normal bowel sounds heard.  Percutaneous drain  intact with no drainage noted. Central nervous system: Alert and oriented. No focal neurological deficits. Extremities: Symmetric 5 x 5 power. Skin: No rashes, lesions or ulcers Psychiatry: Judgement and insight appear normal. Mood & affect appropriate.     Data Reviewed: I have personally reviewed following labs and imaging studies  CBC: Recent Labs  Lab 03/18/18 0849 03/19/18 0545 03/20/18 0516 03/21/18 0351 03/22/18 0334  WBC 12.3* 10.7* 14.7* 14.9* 14.3*  NEUTROABS 9.0* 7.1 10.8* 11.1* 10.9*  HGB 8.6* 7.5* 7.8* 7.5* 7.5*  HCT 28.7* 24.9* 26.3* 24.8* 24.7*  MCV 95.7 96.1 98.1 96.1 95.0  PLT 512* 514* 565* 571* 594*   Basic Metabolic Panel: Recent Labs  Lab 03/18/18 0849 03/19/18 0545 03/20/18 0516 03/21/18 0351 03/22/18 0334  NA 139 139 139 138 139  K 3.5 3.4* 4.5 3.8 3.5  CL 110 109 113* 111 109  CO2  21* 20* 18* 20* 22  GLUCOSE 126* 145* 135* 114* 110*  BUN 23* 23* 15 13 17   CREATININE 0.80 0.91 0.70 0.80 0.71  CALCIUM 8.8* 8.2* 8.2* 8.3* 8.2*  MG 1.9 1.6* 1.9 1.7 1.5*  PHOS 3.3 3.1 2.2* 3.2 3.3   GFR: Estimated Creatinine Clearance: 99.5 mL/min (by C-G formula based on SCr of 0.71 mg/dL). Liver Function Tests: Recent Labs  Lab 03/17/18 0850 03/18/18 0849 03/19/18 0545 03/21/18 0351 03/22/18 0334  AST 16 18 19 30 28   ALT 15 14 14 18 17   ALKPHOS 94 97 85 91 88  BILITOT 0.5 0.3 0.6 0.5 0.3  PROT 8.0 8.0 6.8 7.0 6.7  ALBUMIN 3.4* 3.2* 2.8* 3.0* 2.8*   No results for input(s): LIPASE, AMYLASE in the last 168 hours. No results for input(s): AMMONIA in the last 168 hours. Coagulation Profile: No results for input(s): INR, PROTIME in the last 168 hours. Cardiac Enzymes: No results for input(s): CKTOTAL, CKMB, CKMBINDEX, TROPONINI in the last 168 hours. BNP (last 3 results) No results for input(s): PROBNP in the last 8760 hours. HbA1C: No results for input(s): HGBA1C in the last 72 hours. CBG: Recent Labs  Lab 03/21/18 2238 03/22/18 0009  03/22/18 0540 03/22/18 0748 03/22/18 1220  GLUCAP 124* 140* 100* 124* 158*   Lipid Profile: No results for input(s): CHOL, HDL, LDLCALC, TRIG, CHOLHDL, LDLDIRECT in the last 72 hours. Thyroid Function Tests: No results for input(s): TSH, T4TOTAL, FREET4, T3FREE, THYROIDAB in the last 72 hours. Anemia Panel: No results for input(s): VITAMINB12, FOLATE, FERRITIN, TIBC, IRON, RETICCTPCT in the last 72 hours. Sepsis Labs: No results for input(s): PROCALCITON, LATICACIDVEN in the last 168 hours.  Recent Results (from the past 240 hour(s))  Culture, blood (Routine X 2) w Reflex to ID Panel     Status: None   Collection Time: 03/14/18  7:12 PM  Result Value Ref Range Status   Specimen Description   Final    BLOOD RIGHT ARM Performed at [facility], [address] [place], [place]    Special Requests   Final    BOTTLES DRAWN AEROBIC AND ANAEROBIC Blood Culture adequate volume Performed at [facility], [address] [place], [place]    Culture   Final    NO GROWTH 5 DAYS Performed at [facility], [address] [place], [facility] [address]    Report Status 03/19/2018 FINAL  Final  Culture, blood (Routine X 2) w Reflex to ID Panel     Status: None   Collection Time: 03/14/18  7:12 PM  Result Value Ref Range Status   Specimen Description   Final    BLOOD RIGHT ARM Performed at [facility], [address], [place], [place]    Special Requests   Final    BOTTLES DRAWN AEROBIC AND ANAEROBIC Blood Culture adequate volume Performed at [facility], [address] [place], [place]    Culture   Final    NO GROWTH 5 DAYS Performed at [facility], [address], [place], [facility] [address]    Report Status 03/19/2018 FINAL  Final  MRSA PCR Screening     Status: None   Collection Time: 03/15/18  6:36 AM  Result Value Ref Range Status   MRSA by PCR  NEGATIVE NEGATIVE Final    Comment:        The GeneXpert MRSA Assay (FDA approved for NASAL  specimens only), is one component of a comprehensive MRSA colonization surveillance program. It is not intended to diagnose MRSA infection nor to guide or monitor treatment for MRSA infections. Performed at [facility], [address], [place], [place] [address]   Aerobic/Anaerobic Culture (surgical/deep wound)     Status: None   Collection Time: 03/15/18 11:51 AM  Result Value Ref Range Status   Specimen Description   Final    ABSCESS LEFT BUTTOCKS Performed at [facility], [address] [place], [place] [address]    Special Requests   Final    Normal Performed at [facility], [address], [place], [place] [address]    Gram Stain   Final    FEW WBC PRESENT,BOTH PMN AND MONONUCLEAR RARE GRAM POSITIVE COCCI IN PAIRS AND CHAINS    Culture   Final    FEW VIRIDANS STREPTOCOCCUS NO ANAEROBES ISOLATED Performed at [facility], [address], [place], [place] [address]    Report Status 03/20/2018 FINAL  Final         Radiology Studies: No results found.      Scheduled Meds: . calcium carbonate  1 tablet Oral BID  . cetirizine  10 mg Oral QHS  . clotrimazole  1 application Topical BID  . diclofenac sodium  2 g Topical QID  . docusate sodium  100 mg Oral Daily  . enoxaparin (LOVENOX) injection  40 mg Subcutaneous Q24H  . famotidine  20 mg Oral BID  . feeding supplement (ENSURE ENLIVE)  237 mL Oral BID BM  . fluconazole  100 mg Oral Daily  . folic acid  1 mg Oral Daily  . furosemide  120 mg Oral Daily  . insulin aspart  0-9 Units Subcutaneous Q4H  . isosorbide-hydrALAZINE  2 tablet Oral TID  . lidocaine  1 patch Transdermal Q24H  . metoprolol succinate  50 mg Oral Daily  . mometasone-formoterol  2 puff Inhalation BID  . pantoprazole  40 mg Oral Daily  . sacubitril-valsartan  1 tablet Oral BID  .  sodium chloride flush  3 mL Intravenous Q12H  . sodium chloride flush  5 mL Intracatheter Q8H  . valACYclovir  500 mg Oral BID   Continuous Infusions: . sodium chloride 10 mL/hr at 03/19/18 1800  . levofloxacin (LEVAQUIN) IV 750 mg (03/22/18 1216)  . magnesium sulfate 1 - 4 g bolus IVPB    . metronidazole 500 mg (03/22/18 1017)     LOS: 8 days    Time spent: 40 minutes    Ramiro Harvest, MD [facility] Pager [phone]  If 7PM-7AM, please contact night-coverage [email or web address] Password [email or web address] 03/22/2018, 1:20 PM

## 2018-03-22 NOTE — Progress Notes (Signed)
Patient ID: [name], female   DOB: 02-25-1971, 47 y.o.   MRN: [id number]       Subjective: Patient states she is still having pain, but is sitting in her chair clearly in NAD.  She states that she is having some emesis and as well as diarrhea.  She points to her pain being suprapubic in nature.  When asked about urinary symptoms she then states that her urine looks awful with discoloration and mucus etc.  When asked why she didn't tell anyone, she really couldn't answer.   Last UA 8 days ago was normal.  Objective: Vital signs in last 24 hours: Temp:  [98.2 F (36.8 C)-99 F (37.2 C)] 98.7 F (37.1 C) (12/11 0543) Pulse Rate:  [94-102] 94 (12/11 0543) Resp:  [16-18] 16 (12/11 0543) BP: (101-119)/(53-66) 119/66 (12/11 0543) SpO2:  [98 %-100 %] 98 % (12/11 0543) Last BM Date: 03/22/18  Intake/Output from previous day: 12/10 0701 - 12/11 0700 In: 5  Out: -  Intake/Output this shift: No intake/output data recorded.  PE: Heart: regular Lungs: CTAB Abd: soft, NT to palpation, seems a bit bloated in upper abdomen, but difficult to tell due to obese body habitus, JP drain in place with minimal flush like output.  +BS.  Lab Results:  Recent Labs    03/21/18 0351 03/22/18 0334  WBC 14.9* 14.3*  HGB 7.5* 7.5*  HCT 24.8* 24.7*  PLT 571* 594*   BMET Recent Labs    03/21/18 0351 03/22/18 0334  NA 138 139  K 3.8 3.5  CL 111 109  CO2 20* 22  GLUCOSE 114* 110*  BUN 13 17  CREATININE 0.80 0.71  CALCIUM 8.3* 8.2*   PT/INR No results for input(s): LABPROT, INR in the last 72 hours. CMP     Component Value Date/Time   NA 139 03/22/2018 0334   NA 142 01/06/2017 1433   K 3.5 03/22/2018 0334   CL 109 03/22/2018 0334   CO2 22 03/22/2018 0334   GLUCOSE 110 (H) 03/22/2018 0334   BUN 17 03/22/2018 0334   BUN 16 01/06/2017 1433   CREATININE 0.71 03/22/2018 0334   CALCIUM 8.2 (L) 03/22/2018 0334   PROT 6.7 03/22/2018 0334   ALBUMIN 2.8 (L) 03/22/2018 0334   AST 28  03/22/2018 0334   ALT 17 03/22/2018 0334   ALKPHOS 88 03/22/2018 0334   BILITOT 0.3 03/22/2018 0334   GFRNONAA >60 03/22/2018 0334   GFRAA >60 03/22/2018 0334   Lipase     Component Value Date/Time   LIPASE 30 03/14/2018 1319       Studies/Results: No results found.  Anti-infectives: Anti-infectives (From admission, onward)   Start     Dose/Rate Route Frequency Ordered Stop   03/18/18 1000  valACYclovir (VALTREX) tablet 500 mg     500 mg Oral 2 times daily 03/18/18 0806     03/17/18 1830  fluconazole (DIFLUCAN) tablet 100 mg     100 mg Oral Daily 03/17/18 1716     03/15/18 0400  ciprofloxacin (CIPRO) IVPB 400 mg     400 mg 200 mL/hr over 60 Minutes Intravenous Every 12 hours 03/14/18 1722     03/14/18 1830  metroNIDAZOLE (FLAGYL) IVPB 500 mg     500 mg 100 mL/hr over 60 Minutes Intravenous Every 8 hours 03/14/18 1722     03/14/18 1830  fluconazole (DIFLUCAN) IVPB 100 mg  Status:  Discontinued     100 mg 50 mL/hr over 60 Minutes Intravenous Every  24 hours 03/14/18 1742 03/17/18 1716   03/14/18 1515  ciprofloxacin (CIPRO) IVPB 400 mg     400 mg 200 mL/hr over 60 Minutes Intravenous  Once 03/14/18 1512 03/14/18 1716   03/14/18 1515  metroNIDAZOLE (FLAGYL) IVPB 500 mg  Status:  Discontinued     500 mg 100 mL/hr over 60 Minutes Intravenous  Once 03/14/18 1512 03/14/18 1808       Assessment/Plan Rheumatoid arthritis, on Humira, Plaquenil, and methotrexate Nonischemic cardiomyopathy with an EF now up to 40-45% Hypertension Acute kidney injury Hx diabetes mellitus-currently on no treatment for this. Obesity  Sigmoid diverticulitis with 5.4 x 4.4 cm pre-rectal abscess/fluid collection -IRplaced TG drain. CX shows WBC with rare gram + cocci in pairs and chains. Await final CX, but prelim shows STREP VIRIDANS -cont abx therapy,WBCstable at 14K -CT scan ordered yesterday but not done and still pending.  By report patient states she is having diarrhea, N/V, changes  to her urine (only when asked and tends to mimick answers to question asked.)  Will decrease diet to clear liquids, check a UA today to confirm if she is having any evidence of colovesical issues.  Patient does have significant diverticulitis with adjacent SB inflammatory changes on her last scan so some of these symptoms certainly may be signs of failure to improve, but patient's affect and poor historian type make it difficult to know if she is truly failing to improve.  Will await CT scan today as this will help in determining her overall progression.  FEN -IVFs/CLD VTE -SCDs/Lovenox ID -Cipro/Flagyl 12/3 -->   LOS: 8 days    Letha Cape , [facility] Surgery 03/22/2018, 8:10 AM Pager: [phone]

## 2018-03-22 NOTE — Progress Notes (Signed)
Referring Physician(s): Toth,P  Supervising Physician: Irish Lack  Patient Status:  [facility] - In-pt  Chief Complaint: Abdominal/pelvic pain   Subjective: Pt states she feels a little better today; still has some soreness at left TG drain site but improved; does have occ N/V, loose stools   Allergies: Clarithromycin; Hydrocodone; Ondansetron; Penicillins; and Tdap [tetanus-diphth-acell pertussis]  Medications: Prior to Admission medications   Medication Sig Start Date End Date Taking? Authorizing Provider  Adalimumab (HUMIRA) 20 MG/0.2ML PSKT Inject into the skin 2 (two) times a week.   Yes [provider]  albuterol (PROAIR HFA) 108 (90 BASE) MCG/ACT inhaler Inhale 2 puffs into the lungs every 6 (six) hours as needed for wheezing or shortness of breath.   Yes [provider]  albuterol (PROVENTIL) (2.5 MG/3ML) 0.083% nebulizer solution Take 2.5 mg by nebulization every 6 (six) hours as needed for wheezing or shortness of breath.   Yes [provider]  clotrimazole (LOTRIMIN) 1 % cream Apply 1 application topically 2 (two) times daily. 03/13/18  Yes [provider]  ferrous gluconate (FERGON) 324 MG tablet Take 324 mg by mouth daily.   Yes [provider]  folic acid (FOLVITE) 1 MG tablet Take 1 mg by mouth daily.   Yes [provider]  furosemide (LASIX) 40 MG tablet Take 3 tablets (120mg ) in the mornings and 2 tablets (80mg ) in the evenings by mouth daily Patient taking differently: Take 120 mg by mouth every morning.  05/20/17  Yes Graciella Freer, PA-C  hydroxychloroquine (PLAQUENIL) 200 MG tablet Take 400 mg by mouth daily.    Yes [provider]  isosorbide-hydrALAZINE (BIDIL) 20-37.5 MG tablet Take 2 tablets by mouth 3 (three) times daily. 05/20/17  Yes Graciella Freer, PA-C  levocetirizine (XYZAL) 5 MG tablet Take 5 mg by mouth at bedtime.    Yes [provider]  metFORMIN (GLUCOPHAGE) 500  MG tablet Take 1,000 mg by mouth 2 (two) times daily with a meal.    Yes [provider]  methocarbamol (ROBAXIN) 500 MG tablet Take 500 mg by mouth 2 (two) times daily as needed (for muscle spasms.).    Yes [provider]  Methotrexate, PF, 25 MG/0.5ML SOAJ Inject 0.7 mLs into the skin once a week. On Friday   Yes [provider]  metoprolol succinate (TOPROL-XL) 50 MG 24 hr tablet Take 1.5 tabs daily 01/26/18  Yes Laurey Morale, MD  omeprazole (PRILOSEC) 40 MG capsule Take 40 mg by mouth daily before breakfast.  09/29/16  Yes [provider]  sacubitril-valsartan (ENTRESTO) 97-103 MG Take 1 tablet by mouth 2 (two) times daily. 05/20/17  Yes Graciella Freer, PA-C  SYMBICORT 160-4.5 MCG/ACT inhaler Inhale 2 puffs into the lungs daily as needed (for respiratory issues.).  06/01/13  Yes [provider]  traMADol (ULTRAM) 50 MG tablet Take 1 tablet (50 mg total) by mouth 4 (four) times daily. pain Patient taking differently: Take 50 mg by mouth 4 (four) times daily as needed (for pain.).  03/05/12  Yes Rai, Ripudeep K, MD  triamcinolone cream (KENALOG) 0.1 % Apply 1 application topically 2 (two) times daily as needed (for ezcema.).   Yes [provider]  valACYclovir (VALTREX) 500 MG tablet Take 500 mg by mouth 2 (two) times daily as needed (for cold sores.).   Yes [provider]  spironolactone (ALDACTONE) 25 MG tablet Take 1 tablet (25 mg total) by mouth daily. 05/20/17 01/26/18  Graciella Freer, PA-C  Vital Signs: BP 100/63 (BP Location: Left Arm)   Pulse 79   Temp (!) 97.5 F (36.4 C) (Oral)   Resp 16   Ht 5\' 1"  (1.549 m)   Wt 227 lb 1.2 oz (103 kg)   LMP 03/15/2018 Comment: negative urine pregnancy test 03-14-2018  SpO2 100%   BMI 42.91 kg/m   Physical Exam awake/alert ;left pelvic drain intact, small amount serous fluid in JP bulb; output 10 cc  Imaging: Ct Abdomen Pelvis W Contrast  Result Date:  03/22/2018 CLINICAL DATA:  Suprapubic abdominal pain. EXAM: CT ABDOMEN AND PELVIS WITH CONTRAST TECHNIQUE: Multidetector CT imaging of the abdomen and pelvis was performed using the standard protocol following bolus administration of intravenous contrast. CONTRAST:  14/02/2018 OMNIPAQUE IOHEXOL 300 MG/ML  SOLN COMPARISON:  CT scan of March 14, 2018. FINDINGS: Lower chest: No acute abnormality. Hepatobiliary: Cholelithiasis is again noted without inflammation. No hepatic lesion is noted. No biliary dilatation is noted. Pancreas: Unremarkable. No pancreatic ductal dilatation or surrounding inflammatory changes. Spleen: Normal in size without focal abnormality. Adrenals/Urinary Tract: Adrenal glands appear normal. Stable left renal cyst is noted. No hydronephrosis or renal obstruction is noted. Urinary bladder is unremarkable. Stomach/Bowel: Stomach and appendix are unremarkable. There is no evidence of bowel obstruction. Stable wall thickening and surrounding inflammatory changes are seen involving the sigmoid colon consistent with diverticulitis. There is been interval placement of 2 left transgluteal drainage catheter which passes through pre rectal fluid collection described on prior CT scan. This fluid collection now measures 4.2 x 2.7 cm in size and is significantly decreased compared to prior exam, although significant residual fluid and gas remains. Another ill-defined fluid collection is noted inferior to the sigmoid colon which measures 4.6 x 2.2 cm. Vascular/Lymphatic: No significant vascular findings are present. No enlarged abdominal or pelvic lymph nodes. Reproductive: Uterus and right adnexal regions are unremarkable. Left adnexal region is not well visualized due to inflammatory changes resulting from previously described sigmoid diverticulitis. Other: No hernia is noted. Musculoskeletal: No acute or significant osseous findings. IMPRESSION: Findings consistent with sigmoid diverticulitis as described on  prior exam. Interval placement of drainage catheter into pre rectal abscess seen on prior exam, which is significantly improved compared to prior exam. However, significant amount of residual fluid and gas remains within the fluid collection. There does appear to be interval development of ill-defined fluid collection inferior to the sigmoid colon which measures 4.6 x 2.2 cm which may represent developing abscess. Attention to on follow-up scans is recommended. Cholelithiasis. Electronically Signed   By: March 16, 2018, M.D.   On: 03/22/2018 15:41    Labs:  CBC: Recent Labs    03/19/18 0545 03/20/18 0516 03/21/18 0351 03/22/18 0334  WBC 10.7* 14.7* 14.9* 14.3*  HGB 7.5* 7.8* 7.5* 7.5*  HCT 24.9* 26.3* 24.8* 24.7*  PLT 514* 565* 571* 594*    COAGS: Recent Labs    03/15/18 0519  INR 0.99    BMP: Recent Labs    03/19/18 0545 03/20/18 0516 03/21/18 0351 03/22/18 0334  NA 139 139 138 139  K 3.4* 4.5 3.8 3.5  CL 109 113* 111 109  CO2 20* 18* 20* 22  GLUCOSE 145* 135* 114* 110*  BUN 23* 15 13 17   CALCIUM 8.2* 8.2* 8.3* 8.2*  CREATININE 0.91 0.70 0.80 0.71  GFRNONAA >60 >60 >60 >60  GFRAA >60 >60 >60 >60    LIVER FUNCTION TESTS: Recent Labs    03/18/18 0849 03/19/18 0545 03/21/18 0351 03/22/18 0334  BILITOT 0.3 0.6 0.5 0.3  AST 18 19 30 28   ALT 14 14 18 17   ALKPHOS 97 85 91 88  PROT 8.0 6.8 7.0 6.7  ALBUMIN 3.2* 2.8* 3.0* 2.8*    Assessment and Plan: Pt with hx sigmoid diverticulitis with assoc abscess, s/p drain placement 12/4; afebrile; WBC 14.3(14.9), hgb 7.5(7.5), creat nl; UA with mod leuks, neg nitrite, on antbx; f/u CT today reviewed- improved but unresolved pelvic abscess; fluid inferior to sigmoid not drainable; cont current tx/drain irrigation;ambulate; will need drain injection before removal   Electronically Signed: D. , PA-C 03/22/2018, 3:48 PM   I spent a total of 15 minutes at the the patient's bedside AND on the patient's  hospital floor or unit, greater than 50% of which was counseling/coordinating care for pelvic abscess drain    Patient ID: [name], female   DOB: Jun 19, 1970, 47 y.o.   MRN: [id number]

## 2018-03-23 LAB — COMPREHENSIVE METABOLIC PANEL
ALT: 17 U/L (ref 0–44)
ANION GAP: 10 (ref 5–15)
AST: 24 U/L (ref 15–41)
Albumin: 2.9 g/dL — ABNORMAL LOW (ref 3.5–5.0)
Alkaline Phosphatase: 82 U/L (ref 38–126)
BILIRUBIN TOTAL: 0.5 mg/dL (ref 0.3–1.2)
BUN: 20 mg/dL (ref 6–20)
CO2: 21 mmol/L — AB (ref 22–32)
Calcium: 8.3 mg/dL — ABNORMAL LOW (ref 8.9–10.3)
Chloride: 109 mmol/L (ref 98–111)
Creatinine, Ser: 0.86 mg/dL (ref 0.44–1.00)
GFR calc Af Amer: >60 mL/min (ref >60)
GFR calc non Af Amer: >60 mL/min (ref >60)
Glucose, Bld: 106 mg/dL — ABNORMAL HIGH (ref 70–99)
Potassium: 3.7 mmol/L (ref 3.5–5.1)
Sodium: 140 mmol/L (ref 135–145)
Total Protein: 7.1 g/dL (ref 6.5–8.1)

## 2018-03-23 LAB — GLUCOSE, CAPILLARY
GLUCOSE-CAPILLARY: 102 mg/dL — AB (ref 70–99)
Glucose-Capillary: 105 mg/dL — ABNORMAL HIGH (ref 70–99)
Glucose-Capillary: 103 mg/dL — ABNORMAL HIGH (ref 70–99)
Glucose-Capillary: 119 mg/dL — ABNORMAL HIGH (ref 70–99)
Glucose-Capillary: 174 mg/dL — ABNORMAL HIGH (ref 70–99)
Glucose-Capillary: 122 mg/dL — ABNORMAL HIGH (ref 70–99)

## 2018-03-23 LAB — MAGNESIUM: Magnesium: 2.4 mg/dL (ref 1.7–2.4)

## 2018-03-23 LAB — URINE CULTURE

## 2018-03-23 MED ORDER — TRAMADOL HCL 50 MG PO TABS
100.0000 mg | ORAL_TABLET | Freq: Four times a day (QID) | ORAL | Status: DC | PRN
  Administered 2018-03-24 – 2018-03-28 (×7): 100 mg via ORAL
  Filled 2018-03-23 (×7): qty 2

## 2018-03-23 MED ORDER — ACETAMINOPHEN 500 MG PO TABS
1000.0000 mg | ORAL_TABLET | Freq: Four times a day (QID) | ORAL | Status: DC
  Administered 2018-03-23 – 2018-03-28 (×20): 1000 mg via ORAL
  Filled 2018-03-23 (×24): qty 2

## 2018-03-23 NOTE — Progress Notes (Signed)
PROGRESS NOTE    DEAISA HOSAKA  TYY:[id number] DOB: 12/23/1970 DOA: 03/14/2018 PCP: Shirlean Mylar, MD    Brief Narrative:  HPI per Dr. Osvaldo Shipper on 03/14/18 [name] a 47 y.o.femalewith a past medical history of chronic systolic CHF with ejection fraction of 40 to 45% based on echocardiogram from October, history of rheumatoid arthritis followed by rheumatology in [facility] on multiple immunosuppressants, history of diabetes mellitus type 2, history of asthma who developed yeast infection in her lower abdomen and in the groin area for which she has been taking antifungal tablet as well as an antifungal cream. This has been ongoing for about 2 weeks. She has had a lot of burning sensation in the lower part at the site of these lesions. She tells me that with treatment the lesions have been improving. And then about 3 days ago she started developing pain in the lower abdomen which is slightly different. She had fever and chills last night although she did not check her temperature. The pain was 8 out of 10 in intensity. She had nausea and vomiting x1. She had a loose stool yesterday. She has hadpoor oral intake.  In the emergency department she underwent a CT scan which revealed sigmoid diverticulitis with fluid collection suspicious for abscess. She was hospitalized for further management. General surgery was consulted. Urine pregnancy test was negative. Last menstrual period was 12th ofNovember.  **She is status post CT drain placement via transgluteal approach on the left side for her pelvic abscess.  Her main complaint now lower abdominal pain. Pain medications are now attempting to be weaned and her Diuretics have been slowly started to be added back and she was placed back on po Lasix. She got a dose of IV Ketorlac and patient states it has helped with her pain so she will get another round today of 15 mg IV q12hprn x2 doses. Surgery and IR recommending repeating a CT  Scan and it its to be done today.   Assessment & Plan:   Principal Problem:   Perforation of sigmoid colon due to diverticulitis Active Problems:   Rheumatoid arthritis (HCC)   Chronic systolic heart failure (HCC)   Abscess of sigmoid colon due to diverticulitis s/p perc drainage 03/15/2018   Diverticulitis of large intestine with abscess without bleeding  Acute Sigmoid Diverticulitis with Abscess status post percutaneous drain placement via transgluteal approach, improving  -CT of the abdomen pelvis with contrast showed sigmoid diverticulitis resulting in secondary inflammation and thickening of the adjacent small bowel loops.  There is a 5.2 x 4.4 cm probable abscess in the perirectal space -General surgery has been consulted and they recommend broad-spectrum IV antibiotic and percutaneous drainage of abscess.  IR was consulted and patient underwent percutaneous drain placement by Dr. Miles Costain on 03/15/18 -General surgery recommending continue IV antibiotic therapy for now. -Patient currently on clear liquid diet per general surgery.  -With some emesis noted 03/22/2018, is some concern from diarrhea or stool from the urine. -Patient was placed on IV Ciprofloxacin and Metronidazole and dose have been changed to IV Levaquin and metronidazole. She is allergic to penicillin.  -Due to concern for yeast infection she was placed on Diflucan as well IV but this has been changed to po  -Her WBC is noted to be elevated from infection and WBC peaked to 24,100 and slightly up from 22,500 on admission; Now was trending down and currently at 14.3 on 03/22/2018.  -She is afebrile but has slightlyelevated heart rate.No  clearsepsis is identified at this time. Otherwise she is hemodynamically stable.  -Blood cultures x2 show NGTD at 5 Days -IR placed drain and aspirated 13 cc of fluid that was sent off for Analysis; Aspirate Gram stain showed few WBCs both PMNs of mononuclear with rare gram-positive  cocci in pairs and chains. Cx grew out Few Viridians Streptococcus. IR recommending continued every shift flushing and monitoring of output -Repeat UA with cultures and sensitivities pending. -Repeat CT abdomen and pelvis with initial abscess smaller and concern for new fluid collection with still some inflammation.  Urine cultures pending.  -Pain control with IV morphine 2 mg to 3 PRN chagned to 1 mg q 4PRN for severe pain along with methocarbamol 500 mg p.o. twice daily PRN for muscle spasm -Patient diet has been changed to clear liquids per general surgery. -Continue empiric IV antibiotics. -General surgery following and appreciate input and recommendations.  Back Pain -Likely in the setting of where her drain is placed -Continue Lidoderm patch and diclofenac -Continue with IV morphine but increased frequency from 2 mg every 4 hours as needed severe pain to every 3 hours as needed yesterday but will scale back to 1 mg q4h PRN and will continue po Tramadol 40 mg po 4 times Daily PRN -Continue with methocarbamol 500 mg p.o. twice daily PRN for muscle spasms -Gave 1x of IV Ketorolac 30 mg daily for the last few days and IV Ketorolac 15 mg q12hprn.  Yeast Infection -She has skin lesions in the lower abdomen and in the groin area.  -This is been treated by her primary care provider with antifungal agents.  -C/w Topical Antifungal Agents with Clotrimazole 1 application topically BID -C/w Fluconazole 100 mg IV q24h and change to po now that diet has been advanced. She is on Day 9/10 of Fluconazole  Chronic Systolic CHF -EF noted to be 40 to 45% based on last echocardiogram done in October.  -She is followed by the heart failure clinic.  -Her elevated BUN and creatinine suggest a degree of hypovolemia.  -She was gently hydrated and now IVF has stopped. - -Lasix, Entresto, Toprol-XL, Imdur, spironolactone have been resumed.   -I's and O's not properly measured.   -Blood pressure somewhat  borderline however patient currently asymptomatic and will monitor for now while on cardiac medication.   -Strict I's and O's.  Daily weights.  -May need to consult Cardiology if there is need for surgical intervention.  Mild Acute Renal Failure, improved  -Likely secondary to prerenal azotemia.  Renal function improved with hydration.   - BUN and creatinine noted to be higher than her usual baseline and was 40/1.21 on Admission.  -Given Gentle IVF hydration for 12 hours and now discontinued. -Resumed Home Entresto, Lasix, spironolactone, Imdur. -Follow. -Monitor blood pressure closely.  Well-controlled diabetes Mellitus Type 2 -Hemoglobin A1c 6.2 on 03/15/2018.   -CBG 119 this morning.   -Patient on clear liquids per general surgery.  -Continue to hold oral hypoglycemic agents.  Rheumatoid Arthritis -Patient on multiple immunosuppressants.  -Has not been on steroids in the last 6 months. She takes steroids only as needed for flareups.  -She is noted to be on Methotrexate andHydroxychloroquine.  -She gets Humira injections but has not received them in a few weeks.  -Continue to hold immunosuppressive medications due to acute infection.  -She is followed by Rheumatology in [facility] and will need outpatient follow up at D/C.  Normocytic Anemia -Anemia panel done and showed an iron level of 10,  U IBC of 255, TIBC of 265, saturation ratios of 4%, ferritin level 460, folate level 20.2, and vitamin B12 level 118 -Hemoglobin lower than her baseline on admission. -Hemoglobin currently at 7.5.  -Hold off on IV iron at this time.   -Will likely need oral iron supplementation on discharge.   -Transfusion threshold hemoglobin less than 7.   Morbid Obesity -Estimated body mass index is 44.11 kg/m as calculated from the following:   Height as of this encounter: 5\' 1"  (1.549 m).   Weight as of this encounter: 105.9 kg.  -Weight loss Counseling given   Hypokalemia -Patient  potassium is 3.7 -Replete as needed.   -Follow.    Hypomagnesemia -Magnesium level at 2.4.    Hypophosphatemia -Patient's Phos Level currently at 3.3.  Thrombocytosis -Patient's platelet count increasing and currently at 594.  Likely secondary to acute infection.  -Follow.    -Abnormal urinalysis ??  For colovesicular fistula.  Urine cultures pending.  Continue empiric IV Levaquin.    DVT prophylaxis: Lovenox Code Status: Full Family Communication: Updated patient.  No family at bedside. Disposition Plan: Likely home when clinically improved and per general surgery.   Consultants:   General surgery: Dr. Johna Sheriff 03/14/2018  Interventional radiology: Dr. Miles Costain 03/15/2018  Procedures:  CT abdomen and pelvis 03/14/2018  CT abdomen and pelvis 03/22/2018  CT-guided left transgluteal pelvic abscess drain insertion per Dr. Miles Costain 03/15/2018  Antimicrobials:   Ciprofloxacin 03/15/2018>>>>>> 03/22/2018  IV Levaquin 03/22/2018   Subjective: Patient sitting in chair.  Denies any further emesis or brownish urine.  Feels abdominal pain slowly improving since admission.  Tolerating current diet.    Objective: Vitals:   03/22/18 2039 03/23/18 0442 03/23/18 1100 03/23/18 1201  BP: (!) 110/53 (!) 96/50 (!) 97/57   Pulse: 79 84 82   Resp: 18 18    Temp: 97.6 F (36.4 C) 98.1 F (36.7 C)    TempSrc: Oral Oral    SpO2: 100% 98%  100%  Weight:      Height:        Intake/Output Summary (Last 24 hours) at 03/23/2018 1246 Last data filed at 03/23/2018 1012 Gross per 24 hour  Intake 652.52 ml  Output 360 ml  Net 292.52 ml   Filed Weights   03/18/18 0416 03/20/18 0419 03/22/18 1405  Weight: 104.1 kg 105.9 kg 103 kg    Examination:  General exam: NAD  Respiratory system: Lungs are to auscultation bilaterally.  No wheezes, no crackles, no rhonchi.  Respiratory effort normal. Cardiovascular system: Regular rate rhythm no murmurs rubs or gallops.  No JVD.  No lower  extremity edema. Gastrointestinal system: Abdomen is soft, nontender, nondistended, positive bowel sounds.  JP drain intact.  Central nervous system: Alert and oriented. No focal neurological deficits. Extremities: Symmetric 5 x 5 power. Skin: No rashes, lesions or ulcers Psychiatry: Judgement and insight appear fair. Mood & affect appropriate.     Data Reviewed: I have personally reviewed following labs and imaging studies  CBC: Recent Labs  Lab 03/18/18 0849 03/19/18 0545 03/20/18 0516 03/21/18 0351 03/22/18 0334  WBC 12.3* 10.7* 14.7* 14.9* 14.3*  NEUTROABS 9.0* 7.1 10.8* 11.1* 10.9*  HGB 8.6* 7.5* 7.8* 7.5* 7.5*  HCT 28.7* 24.9* 26.3* 24.8* 24.7*  MCV 95.7 96.1 98.1 96.1 95.0  PLT 512* 514* 565* 571* 594*   Basic Metabolic Panel: Recent Labs  Lab 03/18/18 0849 03/19/18 0545 03/20/18 0516 03/21/18 0351 03/22/18 0334 03/23/18 0412  NA 139 139 139 138 139 140  K 3.5 3.4* 4.5 3.8 3.5 3.7  CL 110 109 113* 111 109 109  CO2 21* 20* 18* 20* 22 21*  GLUCOSE 126* 145* 135* 114* 110* 106*  BUN 23* 23* 15 13 17 20   CREATININE 0.80 0.91 0.70 0.80 0.71 0.86  CALCIUM 8.8* 8.2* 8.2* 8.3* 8.2* 8.3*  MG 1.9 1.6* 1.9 1.7 1.5* 2.4  PHOS 3.3 3.1 2.2* 3.2 3.3  --    GFR: Estimated Creatinine Clearance: 91.2 mL/min (by C-G formula based on SCr of 0.86 mg/dL). Liver Function Tests: Recent Labs  Lab 03/18/18 0849 03/19/18 0545 03/21/18 0351 03/22/18 0334 03/23/18 0412  AST 18 19 30 28 24   ALT 14 14 18 17 17   ALKPHOS 97 85 91 88 82  BILITOT 0.3 0.6 0.5 0.3 0.5  PROT 8.0 6.8 7.0 6.7 7.1  ALBUMIN 3.2* 2.8* 3.0* 2.8* 2.9*   No results for input(s): LIPASE, AMYLASE in the last 168 hours. No results for input(s): AMMONIA in the last 168 hours. Coagulation Profile: No results for input(s): INR, PROTIME in the last 168 hours. Cardiac Enzymes: No results for input(s): CKTOTAL, CKMB, CKMBINDEX, TROPONINI in the last 168 hours. BNP (last 3 results) No results for input(s):  PROBNP in the last 8760 hours. HbA1C: No results for input(s): HGBA1C in the last 72 hours. CBG: Recent Labs  Lab 03/22/18 2037 03/23/18 0220 03/23/18 0444 03/23/18 0753 03/23/18 1151  GLUCAP 145* 105* 103* 119* 174*   Lipid Profile: No results for input(s): CHOL, HDL, LDLCALC, TRIG, CHOLHDL, LDLDIRECT in the last 72 hours. Thyroid Function Tests: No results for input(s): TSH, T4TOTAL, FREET4, T3FREE, THYROIDAB in the last 72 hours. Anemia Panel: No results for input(s): VITAMINB12, FOLATE, FERRITIN, TIBC, IRON, RETICCTPCT in the last 72 hours. Sepsis Labs: No results for input(s): PROCALCITON, LATICACIDVEN in the last 168 hours.  Recent Results (from the past 240 hour(s))  Culture, blood (Routine X 2) w Reflex to ID Panel     Status: None   Collection Time: 03/14/18  7:12 PM  Result Value Ref Range Status   Specimen Description   Final    BLOOD RIGHT ARM Performed at [facility], [address], [place], [place]    Special Requests   Final    BOTTLES DRAWN AEROBIC AND ANAEROBIC Blood Culture adequate volume Performed at [facility], [address], [place], [place]    Culture   Final    NO GROWTH 5 DAYS Performed at [facility], [address] [place], [facility] [address]    Report Status 03/19/2018 FINAL  Final  Culture, blood (Routine X 2) w Reflex to ID Panel     Status: None   Collection Time: 03/14/18  7:12 PM  Result Value Ref Range Status   Specimen Description   Final    BLOOD RIGHT ARM Performed at [facility], [address] [place], [place]    Special Requests   Final    BOTTLES DRAWN AEROBIC AND ANAEROBIC Blood Culture adequate volume Performed at [facility], [address], [place], [place]    Culture   Final    NO GROWTH 5 DAYS Performed at [facility], [address] [place], [facility] [address]    Report Status  03/19/2018 FINAL  Final  MRSA PCR Screening     Status: None   Collection Time: 03/15/18  6:36 AM  Result Value Ref Range Status  MRSA by PCR NEGATIVE NEGATIVE Final    Comment:        The GeneXpert MRSA Assay (FDA approved for NASAL specimens only), is one component of a comprehensive MRSA colonization surveillance program. It is not intended to diagnose MRSA infection nor to guide or monitor treatment for MRSA infections. Performed at [facility], [address], [place], [place] [address]   Aerobic/Anaerobic Culture (surgical/deep wound)     Status: None   Collection Time: 03/15/18 11:51 AM  Result Value Ref Range Status   Specimen Description   Final    ABSCESS LEFT BUTTOCKS Performed at [facility], [address] [place], [place] [address]    Special Requests   Final    Normal Performed at [facility], [address], [place], [place] [address]    Gram Stain   Final    FEW WBC PRESENT,BOTH PMN AND MONONUCLEAR RARE GRAM POSITIVE COCCI IN PAIRS AND CHAINS    Culture   Final    FEW VIRIDANS STREPTOCOCCUS NO ANAEROBES ISOLATED Performed at [facility], [address], [place], [place] [address]    Report Status 03/20/2018 FINAL  Final  Culture, Urine     Status: None   Collection Time: 03/22/18  5:50 PM  Result Value Ref Range Status   Specimen Description   Final    URINE, RANDOM Performed at [facility], [address] [place], [place] [address]    Special Requests   Final    NONE Performed at [facility], [address], [place], [place] [address]    Culture   Final    Multiple bacterial morphotypes present, none predominant. Suggest appropriate recollection if clinically indicated.   Report Status 03/23/2018 FINAL  Final         Radiology Studies: Ct Abdomen Pelvis W Contrast  Result Date: 03/22/2018 CLINICAL DATA:  Suprapubic abdominal  pain. EXAM: CT ABDOMEN AND PELVIS WITH CONTRAST TECHNIQUE: Multidetector CT imaging of the abdomen and pelvis was performed using the standard protocol following bolus administration of intravenous contrast. CONTRAST:  OMNIPAQUE IOHEXOL 300 MG/ML  SOLN COMPARISON:  CT scan of March 14, 2018. FINDINGS: Lower chest: No acute abnormality. Hepatobiliary: Cholelithiasis is again noted without inflammation. No hepatic lesion is noted. No biliary dilatation is noted. Pancreas: Unremarkable. No pancreatic ductal dilatation or surrounding inflammatory changes. Spleen: Normal in size without focal abnormality. Adrenals/Urinary Tract: Adrenal glands appear normal. Stable left renal cyst is noted. No hydronephrosis or renal obstruction is noted. Urinary bladder is unremarkable. Stomach/Bowel: Stomach and appendix are unremarkable. There is no evidence of bowel obstruction. Stable wall thickening and surrounding inflammatory changes are seen involving the sigmoid colon consistent with diverticulitis. There is been interval placement of 2 left transgluteal drainage catheter which passes through pre rectal fluid collection described on prior CT scan. This fluid collection now measures 4.2 x 2.7 cm in size and is significantly decreased compared to prior exam, although significant residual fluid and gas remains. Another ill-defined fluid collection is noted inferior to the sigmoid colon which measures 4.6 x 2.2 cm. Vascular/Lymphatic: No significant vascular findings are present. No enlarged abdominal or pelvic lymph nodes. Reproductive: Uterus and right adnexal regions are unremarkable. Left adnexal region is not well visualized due to inflammatory changes resulting from previously described sigmoid diverticulitis. Other: No hernia is noted. Musculoskeletal: No acute or significant osseous findings. IMPRESSION: Findings consistent with sigmoid diverticulitis as described on prior exam.  Interval placement of drainage  catheter into pre rectal abscess seen on prior exam, which is significantly improved compared to prior exam. However, significant amount of residual fluid and gas remains within the fluid collection. There does appear to be interval development of ill-defined fluid collection inferior to the sigmoid colon which measures 4.6 x 2.2 cm which may represent developing abscess. Attention to on follow-up scans is recommended. Cholelithiasis. Electronically Signed   By: Lupita Raider, M.D.   On: 03/22/2018 15:41        Scheduled Meds: . acetaminophen  1,000 mg Oral Q6H  . calcium carbonate  1 tablet Oral BID  . cetirizine  10 mg Oral QHS  . clotrimazole  1 application Topical BID  . diclofenac sodium  2 g Topical QID  . docusate sodium  100 mg Oral Daily  . enoxaparin (LOVENOX) injection  40 mg Subcutaneous Q24H  . famotidine  20 mg Oral BID  . feeding supplement (ENSURE ENLIVE)  237 mL Oral BID BM  . fluconazole  100 mg Oral Daily  . folic acid  1 mg Oral Daily  . furosemide  120 mg Oral Daily  . insulin aspart  0-9 Units Subcutaneous Q4H  . isosorbide-hydrALAZINE  2 tablet Oral TID  . lidocaine  1 patch Transdermal Q24H  . metoprolol succinate  50 mg Oral Daily  . mometasone-formoterol  2 puff Inhalation BID  . pantoprazole  40 mg Oral Daily  . sacubitril-valsartan  1 tablet Oral BID  . sodium chloride flush  3 mL Intravenous Q12H  . sodium chloride flush  5 mL Intracatheter Q8H  . valACYclovir  500 mg Oral BID   Continuous Infusions: . sodium chloride 10 mL/hr at 03/19/18 1800  . levofloxacin (LEVAQUIN) IV 750 mg (03/22/18 1216)  . metronidazole 500 mg (03/23/18 1140)     LOS: 9 days    Time spent: 40 minutes    Ramiro Harvest, MD [facility] Pager [phone]  If 7PM-7AM, please contact night-coverage [email or web address] Password [email or web address] 03/23/2018, 12:46 PM

## 2018-03-23 NOTE — Progress Notes (Signed)
Referring Physician(s): Toth,P  Supervising Physician: Irish Lack  Patient Status:  [facility] - In-pt  Chief Complaint: Abdominal/pelvic pain   Subjective: Pt states she feels a little better today; still has some soreness at left TG drain site but improved   Allergies: Clarithromycin; Hydrocodone; Ondansetron; Penicillins; and Tdap [tetanus-diphth-acell pertussis]  Medications:  Current Facility-Administered Medications:  .  0.9 %  sodium chloride infusion, 250 mL, Intravenous, PRN, Osvaldo Shipper, MD, Last Rate: 10 mL/hr at 03/19/18 1800 .  acetaminophen (TYLENOL) tablet 1,000 mg, 1,000 mg, Oral, Q6H, Barnetta Chapel, PA-C .  albuterol (PROVENTIL) (2.5 MG/3ML) 0.083% nebulizer solution 2.5 mg, 2.5 mg, Nebulization, Q6H PRN, Osvaldo Shipper, MD .  calcium carbonate (TUMS - dosed in mg elemental calcium) chewable tablet 200 mg of elemental calcium, 1 tablet, Oral, BID, Sheikh, Omair Latif, DO, 200 mg of elemental calcium at 03/23/18 0610 .  cetirizine (ZYRTEC) tablet 10 mg, 10 mg, Oral, QHS, Sheikh, Omair [place], DO, 10 mg at 03/22/18 2230 .  clotrimazole (LOTRIMIN) 1 % cream 1 application, 1 application, Topical, BID, Chevis Pretty III, MD, 1 application at 03/22/18 2231 .  diclofenac sodium (VOLTAREN) 1 % transdermal gel 2 g, 2 g, Topical, QID, Sheikh, Kateri Mc [place], DO, 2 g at 03/21/18 2136 .  docusate sodium (COLACE) capsule 100 mg, 100 mg, Oral, Daily, Barnetta Chapel, PA-C, 100 mg at 03/22/18 1014 .  enoxaparin (LOVENOX) injection 40 mg, 40 mg, Subcutaneous, Q24H, Sheikh, Omair Latif, DO, 40 mg at 03/22/18 1325 .  famotidine (PEPCID) tablet 20 mg, 20 mg, Oral, BID, Marguerita Merles Latif, DO, 20 mg at 03/22/18 2230 .  feeding supplement (ENSURE ENLIVE) (ENSURE ENLIVE) liquid 237 mL, 237 mL, Oral, BID BM, Barnetta Chapel, PA-C, 237 mL at 03/22/18 1413 .  fluconazole (DIFLUCAN) tablet 100 mg, 100 mg, Oral, Daily, Sheikh, Omair Latif, DO, 100 mg at 03/22/18 1014 .  folic acid (FOLVITE)  tablet 1 mg, 1 mg, Oral, Daily, Osvaldo Shipper, MD, 1 mg at 03/22/18 1014 .  furosemide (LASIX) tablet 120 mg, 120 mg, Oral, Daily, Sheikh, Omair Latif, DO, 120 mg at 03/22/18 1014 .  ibuprofen (ADVIL,MOTRIN) tablet 400 mg, 400 mg, Oral, Q8H PRN, Barnetta Chapel, PA-C, 400 mg at 03/23/18 0649 .  insulin aspart (novoLOG) injection 0-9 Units, 0-9 Units, Subcutaneous, Q4H, Osvaldo Shipper, MD, 1 Units at 03/22/18 2047 .  isosorbide-hydrALAZINE (BIDIL) 20-37.5 MG per tablet 2 tablet, 2 tablet, Oral, TID, Osvaldo Shipper, MD, 2 tablet at 03/22/18 2230 .  levofloxacin (LEVAQUIN) IVPB 750 mg, 750 mg, Intravenous, Q24H, Barnetta Chapel, PA-C, Last Rate: 100 mL/hr at 03/22/18 1216, 750 mg at 03/22/18 1216 .  lidocaine (LIDODERM) 5 % 1 patch, 1 patch, Transdermal, Q24H, Marguerita Merles [place], [place], 1 patch at 03/16/18 1239 .  loratadine (CLARITIN) tablet 10 mg, 10 mg, Oral, Daily PRN, Blount, Xenia T, NP .  methocarbamol (ROBAXIN) tablet 500 mg, 500 mg, Oral, BID PRN, Osvaldo Shipper, MD, 500 mg at 03/23/18 0617 .  metoprolol succinate (TOPROL-XL) 24 hr tablet 50 mg, 50 mg, Oral, Daily, Osvaldo Shipper, MD, 50 mg at 03/22/18 1023 .  metroNIDAZOLE (FLAGYL) IVPB 500 mg, 500 mg, Intravenous, Q8H, Osvaldo Shipper, MD, Last Rate: 100 mL/hr at 03/23/18 0225, 500 mg at 03/23/18 0225 .  mometasone-formoterol (DULERA) 200-5 MCG/ACT inhaler 2 puff, 2 puff, Inhalation, BID, Osvaldo Shipper, MD .  morphine 2 MG/ML injection 1-2 mg, 1-2 mg, Intravenous, Q2H PRN, Chevis Pretty III, MD, 2 mg at 03/20/18 2112 .  pantoprazole (PROTONIX) EC tablet 40  mg, 40 mg, Oral, Daily, Osvaldo Shipper, MD, 40 mg at 03/22/18 1014 .  prochlorperazine (COMPAZINE) injection 10 mg, 10 mg, Intravenous, Q4H PRN, Barnetta Chapel, PA-C, 10 mg at 03/22/18 1859 .  sacubitril-valsartan (ENTRESTO) 97-103 mg per tablet, 1 tablet, Oral, BID, Marguerita Merles [place], [place], 1 tablet at 03/22/18 2229 .  simethicone (MYLICON) chewable tablet 80 mg, 80 mg, Oral, Q6H PRN,  Sheikh, Omair Latif, DO .  sodium chloride flush (NS) 0.9 % injection 3 mL, 3 mL, Intravenous, Q12H, Osvaldo Shipper, MD, 3 mL at 03/22/18 1020 .  sodium chloride flush (NS) 0.9 % injection 3 mL, 3 mL, Intravenous, PRN, Osvaldo Shipper, MD, 3 mL at 03/18/18 1045 .  sodium chloride flush (NS) 0.9 % injection 5 mL, 5 mL, Intracatheter, Q8H, Berdine Dance, MD, 5 mL at 03/23/18 0609 .  traMADol (ULTRAM) tablet 100 mg, 100 mg, Oral, QID PRN, Marguerita Merles Latif, DO, 100 mg at 03/23/18 0310 .  valACYclovir (VALTREX) tablet 500 mg, 500 mg, Oral, BID, Chevis Pretty III, MD, 500 mg at 03/22/18 2230    Vital Signs: BP (!) 96/50 (BP Location: Left Arm)   Pulse 84   Temp 98.1 F (36.7 C) (Oral)   Resp 18   Ht 5\' 1"  (1.549 m)   Wt 103 kg   LMP 03/15/2018 Comment: negative urine pregnancy test 03-14-2018  SpO2 98%   BMI 42.91 kg/m   Physical Exam awake/alert ;left pelvic drain intact. Serous fluid in JP bulb; output 10 cc  Imaging: Ct Abdomen Pelvis W Contrast  Result Date: 03/22/2018 CLINICAL DATA:  Suprapubic abdominal pain. EXAM: CT ABDOMEN AND PELVIS WITH CONTRAST TECHNIQUE: Multidetector CT imaging of the abdomen and pelvis was performed using the standard protocol following bolus administration of intravenous contrast. CONTRAST:  14/02/2018 OMNIPAQUE IOHEXOL 300 MG/ML  SOLN COMPARISON:  CT scan of March 14, 2018. FINDINGS: Lower chest: No acute abnormality. Hepatobiliary: Cholelithiasis is again noted without inflammation. No hepatic lesion is noted. No biliary dilatation is noted. Pancreas: Unremarkable. No pancreatic ductal dilatation or surrounding inflammatory changes. Spleen: Normal in size without focal abnormality. Adrenals/Urinary Tract: Adrenal glands appear normal. Stable left renal cyst is noted. No hydronephrosis or renal obstruction is noted. Urinary bladder is unremarkable. Stomach/Bowel: Stomach and appendix are unremarkable. There is no evidence of bowel obstruction. Stable wall  thickening and surrounding inflammatory changes are seen involving the sigmoid colon consistent with diverticulitis. There is been interval placement of 2 left transgluteal drainage catheter which passes through pre rectal fluid collection described on prior CT scan. This fluid collection now measures 4.2 x 2.7 cm in size and is significantly decreased compared to prior exam, although significant residual fluid and gas remains. Another ill-defined fluid collection is noted inferior to the sigmoid colon which measures 4.6 x 2.2 cm. Vascular/Lymphatic: No significant vascular findings are present. No enlarged abdominal or pelvic lymph nodes. Reproductive: Uterus and right adnexal regions are unremarkable. Left adnexal region is not well visualized due to inflammatory changes resulting from previously described sigmoid diverticulitis. Other: No hernia is noted. Musculoskeletal: No acute or significant osseous findings. IMPRESSION: Findings consistent with sigmoid diverticulitis as described on prior exam. Interval placement of drainage catheter into pre rectal abscess seen on prior exam, which is significantly improved compared to prior exam. However, significant amount of residual fluid and gas remains within the fluid collection. There does appear to be interval development of ill-defined fluid collection inferior to the sigmoid colon which measures 4.6 x 2.2 cm which may represent developing  abscess. Attention to on follow-up scans is recommended. Cholelithiasis. Electronically Signed   By: Lupita Raider, M.D.   On: 03/22/2018 15:41    Labs:  CBC: Recent Labs    03/19/18 0545 03/20/18 0516 03/21/18 0351 03/22/18 0334  WBC 10.7* 14.7* 14.9* 14.3*  HGB 7.5* 7.8* 7.5* 7.5*  HCT 24.9* 26.3* 24.8* 24.7*  PLT 514* 565* 571* 594*    COAGS: Recent Labs    03/15/18 0519  INR 0.99    BMP: Recent Labs    03/20/18 0516 03/21/18 0351 03/22/18 0334 03/23/18 0412  NA 139 138 139 140  K 4.5 3.8  3.5 3.7  CL 113* 111 109 109  CO2 18* 20* 22 21*  GLUCOSE 135* 114* 110* 106*  BUN 15 13 17 20   CALCIUM 8.2* 8.3* 8.2* 8.3*  CREATININE 0.70 0.80 0.71 0.86  GFRNONAA >60 >60 >60 >60  GFRAA >60 >60 >60 >60    LIVER FUNCTION TESTS: Recent Labs    03/19/18 0545 03/21/18 0351 03/22/18 0334 03/23/18 0412  BILITOT 0.6 0.5 0.3 0.5  AST 19 30 28 24   ALT 14 18 17 17   ALKPHOS 85 91 88 82  PROT 6.8 7.0 6.7 7.1  ALBUMIN 2.8* 3.0* 2.8* 2.9*    Assessment and Plan: Pt with hx sigmoid diverticulitis with assoc abscess, s/p drain placement 12/4 Repeat CT showed improved but unresolved pelvic abscess; fluid inferior to sigmoid not drainable; cont current tx/drain irrigation;ambulate; will need drain injection before removal   Electronically Signed: Brayton El, PA-C 03/23/2018, 11:16 AM   I spent a total of 15 minutes at the the patient's bedside AND on the patient's hospital floor or unit, greater than 50% of which was counseling/coordinating care for pelvic abscess drain    Patient ID: [name], female   DOB: October 16, 1970, 47 y.o.   MRN: [id number]

## 2018-03-23 NOTE — Progress Notes (Signed)
Patient ID: [name], female   DOB: November 14, 1970, 47 y.o.   MRN: [id number]       Subjective: Patient with no further nausea or vomiting.  Loves her protein shakes.  Apparently she drinks these at home.  They do give her diarrhea, but she likes them.  Still with suprapubic pain.  Objective: Vital signs in last 24 hours: Temp:  [97.5 F (36.4 C)-98.1 F (36.7 C)] 98.1 F (36.7 C) (12/12 0442) Pulse Rate:  [79-87] 84 (12/12 0442) Resp:  [18] 18 (12/12 0442) BP: (95-110)/(50-63) 96/50 (12/12 0442) SpO2:  [98 %-100 %] 98 % (12/12 0442) Weight:  [103 kg] 103 kg (12/11 1405) Last BM Date: 03/22/18  Intake/Output from previous day: 12/11 0701 - 12/12 0700 In: 417.5 [P.O.:240; IV Piggyback:162.5] Out: 360 [Urine:350; Drains:10] Intake/Output this shift: No intake/output data recorded.  PE: Heart: regular Lungs: CTAB Abd: soft, in general nontender to palpation, +BS, obese, drain with minimal output.  Lab Results:  Recent Labs    03/21/18 0351 03/22/18 0334  WBC 14.9* 14.3*  HGB 7.5* 7.5*  HCT 24.8* 24.7*  PLT 571* 594*   BMET Recent Labs    03/22/18 0334 03/23/18 0412  NA 139 140  K 3.5 3.7  CL 109 109  CO2 22 21*  GLUCOSE 110* 106*  BUN 17 20  CREATININE 0.71 0.86  CALCIUM 8.2* 8.3*   PT/INR No results for input(s): LABPROT, INR in the last 72 hours. CMP     Component Value Date/Time   NA 140 03/23/2018 0412   NA 142 01/06/2017 1433   K 3.7 03/23/2018 0412   CL 109 03/23/2018 0412   CO2 21 (L) 03/23/2018 0412   GLUCOSE 106 (H) 03/23/2018 0412   BUN 20 03/23/2018 0412   BUN 16 01/06/2017 1433   CREATININE 0.86 03/23/2018 0412   CALCIUM 8.3 (L) 03/23/2018 0412   PROT 7.1 03/23/2018 0412   ALBUMIN 2.9 (L) 03/23/2018 0412   AST 24 03/23/2018 0412   ALT 17 03/23/2018 0412   ALKPHOS 82 03/23/2018 0412   BILITOT 0.5 03/23/2018 0412   GFRNONAA >60 03/23/2018 0412   GFRAA >60 03/23/2018 0412   Lipase     Component Value Date/Time   LIPASE 30  03/14/2018 1319       Studies/Results: Ct Abdomen Pelvis W Contrast  Result Date: 03/22/2018 CLINICAL DATA:  Suprapubic abdominal pain. EXAM: CT ABDOMEN AND PELVIS WITH CONTRAST TECHNIQUE: Multidetector CT imaging of the abdomen and pelvis was performed using the standard protocol following bolus administration of intravenous contrast. CONTRAST:  OMNIPAQUE IOHEXOL 300 MG/ML  SOLN COMPARISON:  CT scan of March 14, 2018. FINDINGS: Lower chest: No acute abnormality. Hepatobiliary: Cholelithiasis is again noted without inflammation. No hepatic lesion is noted. No biliary dilatation is noted. Pancreas: Unremarkable. No pancreatic ductal dilatation or surrounding inflammatory changes. Spleen: Normal in size without focal abnormality. Adrenals/Urinary Tract: Adrenal glands appear normal. Stable left renal cyst is noted. No hydronephrosis or renal obstruction is noted. Urinary bladder is unremarkable. Stomach/Bowel: Stomach and appendix are unremarkable. There is no evidence of bowel obstruction. Stable wall thickening and surrounding inflammatory changes are seen involving the sigmoid colon consistent with diverticulitis. There is been interval placement of 2 left transgluteal drainage catheter which passes through pre rectal fluid collection described on prior CT scan. This fluid collection now measures 4.2 x 2.7 cm in size and is significantly decreased compared to prior exam, although significant residual fluid and gas remains. Another ill-defined fluid collection  is noted inferior to the sigmoid colon which measures 4.6 x 2.2 cm. Vascular/Lymphatic: No significant vascular findings are present. No enlarged abdominal or pelvic lymph nodes. Reproductive: Uterus and right adnexal regions are unremarkable. Left adnexal region is not well visualized due to inflammatory changes resulting from previously described sigmoid diverticulitis. Other: No hernia is noted. Musculoskeletal: No acute or significant  osseous findings. IMPRESSION: Findings consistent with sigmoid diverticulitis as described on prior exam. Interval placement of drainage catheter into pre rectal abscess seen on prior exam, which is significantly improved compared to prior exam. However, significant amount of residual fluid and gas remains within the fluid collection. There does appear to be interval development of ill-defined fluid collection inferior to the sigmoid colon which measures 4.6 x 2.2 cm which may represent developing abscess. Attention to on follow-up scans is recommended. Cholelithiasis. Electronically Signed   By: Lupita Raider, M.D.   On: 03/22/2018 15:41    Anti-infectives: Anti-infectives (From admission, onward)   Start     Dose/Rate Route Frequency Ordered Stop   03/22/18 1000  levofloxacin (LEVAQUIN) IVPB 750 mg     750 mg 100 mL/hr over 90 Minutes Intravenous Every 24 hours 03/22/18 0846     03/18/18 1000  valACYclovir (VALTREX) tablet 500 mg     500 mg Oral 2 times daily 03/18/18 0806     03/17/18 1830  fluconazole (DIFLUCAN) tablet 100 mg     100 mg Oral Daily 03/17/18 1716     03/15/18 0400  ciprofloxacin (CIPRO) IVPB 400 mg  Status:  Discontinued     400 mg 200 mL/hr over 60 Minutes Intravenous Every 12 hours 03/14/18 1722 03/22/18 0845   03/14/18 1830  metroNIDAZOLE (FLAGYL) IVPB 500 mg     500 mg 100 mL/hr over 60 Minutes Intravenous Every 8 hours 03/14/18 1722     03/14/18 1830  fluconazole (DIFLUCAN) IVPB 100 mg  Status:  Discontinued     100 mg 50 mL/hr over 60 Minutes Intravenous Every 24 hours 03/14/18 1742 03/17/18 1716   03/14/18 1515  ciprofloxacin (CIPRO) IVPB 400 mg     400 mg 200 mL/hr over 60 Minutes Intravenous  Once 03/14/18 1512 03/14/18 1716   03/14/18 1515  metroNIDAZOLE (FLAGYL) IVPB 500 mg  Status:  Discontinued     500 mg 100 mL/hr over 60 Minutes Intravenous  Once 03/14/18 1512 03/14/18 1808       Assessment/Plan Rheumatoid arthritis, on Humira, Plaquenil, and  methotrexate Nonischemic cardiomyopathy with an EF now up to 40-45% Hypertension Acute kidney injury Hx diabetes mellitus-currently on no treatment for this. Obesity  Sigmoid diverticulitis with 5.4 x 4.4 cm pre-rectal abscess/fluid collection, ? UTI vs colovesical fistula -IRplaced TG drain. CX shows WBC with rare gram + cocci in pairs and chains.  CX shows STREP VIRIDANS -cont abx therapy as new CT shows persistent changes of diverticulitis.  Improvement in collection where drain is, but a new ill-defined collection is present and not able to be drained.   -UA definitely shows a UTI.  Await urine cultures to help determine if bacteria may be coming from a colonic source, c/w colovesical fistula or just simple UTI -abx changed yesterday to levaquin and flagyl based off of culture sensitivities.  Scan not showing really much improvement overall, question need to increase abx therapy. -if patient doesn't improve over the next week or so, she may require surgery which would be a hartman's procedure.  She really wants to not have surgery. -no further  nausea/vomiting.  Will try solid food again.  Will add calorie count to actually see how much she is taking in. Check prealbumin in the am to better assess nutritional status.  Albumin is only 2.8.  FEN -IVFs/soft diet VTE -SCDs/Lovenox ID -Cipro 12/3 --> 12/11, Flagyl 12/3 -->, Levaquin 12/11 -->   LOS: 9 days    Letha Cape , [facility] 03/23/2018, 8:15 AM Pager: [phone]

## 2018-03-24 LAB — GLUCOSE, CAPILLARY
GLUCOSE-CAPILLARY: 111 mg/dL — AB (ref 70–99)
Glucose-Capillary: 105 mg/dL — ABNORMAL HIGH (ref 70–99)
Glucose-Capillary: 112 mg/dL — ABNORMAL HIGH (ref 70–99)
Glucose-Capillary: 150 mg/dL — ABNORMAL HIGH (ref 70–99)
Glucose-Capillary: 111 mg/dL — ABNORMAL HIGH (ref 70–99)
Glucose-Capillary: 131 mg/dL — ABNORMAL HIGH (ref 70–99)

## 2018-03-24 LAB — CBC WITH DIFFERENTIAL/PLATELET
Abs Immature Granulocytes: 0.11 10*3/uL — ABNORMAL HIGH (ref 0.00–0.07)
BASOS ABS: 0 10*3/uL (ref 0.0–0.1)
Basophils Relative: 1 %
Eosinophils Absolute: 0.2 10*3/uL (ref 0.0–0.5)
Eosinophils Relative: 3 %
HCT: 25.7 % — ABNORMAL LOW (ref 36.0–46.0)
Hemoglobin: 8 g/dL — ABNORMAL LOW (ref 12.0–15.0)
Immature Granulocytes: 2 %
LYMPHS ABS: 1.5 10*3/uL (ref 0.7–4.0)
Lymphocytes Relative: 24 %
MCH: 29.1 pg (ref 26.0–34.0)
MCHC: 31.1 g/dL (ref 30.0–36.0)
MCV: 93.5 fL (ref 80.0–100.0)
Monocytes Absolute: 0.6 10*3/uL (ref 0.1–1.0)
Monocytes Relative: 10 %
NRBC: 0 % (ref 0.0–0.2)
Neutro Abs: 3.9 10*3/uL (ref 1.7–7.7)
Neutrophils Relative %: 60 %
Platelets: 662 10*3/uL — ABNORMAL HIGH (ref 150–400)
RBC: 2.75 MIL/uL — ABNORMAL LOW (ref 3.87–5.11)
RDW: 17 % — ABNORMAL HIGH (ref 11.5–15.5)
WBC: 6.4 10*3/uL (ref 4.0–10.5)

## 2018-03-24 LAB — BASIC METABOLIC PANEL
Anion gap: 11 (ref 5–15)
BUN: 23 mg/dL — AB (ref 6–20)
CO2: 23 mmol/L (ref 22–32)
Calcium: 8.6 mg/dL — ABNORMAL LOW (ref 8.9–10.3)
Chloride: 107 mmol/L (ref 98–111)
Creatinine, Ser: 0.93 mg/dL (ref 0.44–1.00)
GFR calc Af Amer: >60 mL/min (ref >60)
Glucose, Bld: 112 mg/dL — ABNORMAL HIGH (ref 70–99)
Potassium: 3.6 mmol/L (ref 3.5–5.1)
Sodium: 141 mmol/L (ref 135–145)

## 2018-03-24 LAB — URINALYSIS, ROUTINE W REFLEX MICROSCOPIC
Bilirubin Urine: NEGATIVE
GLUCOSE, UA: NEGATIVE mg/dL
Hgb urine dipstick: NEGATIVE
Ketones, ur: NEGATIVE mg/dL
LEUKOCYTES UA: NEGATIVE
Nitrite: NEGATIVE
Protein, ur: NEGATIVE mg/dL
Specific Gravity, Urine: 1.005 (ref 1.005–1.030)
pH: 5 (ref 5.0–8.0)

## 2018-03-24 LAB — PREALBUMIN: Prealbumin: 11.1 mg/dL — ABNORMAL LOW (ref 18–38)

## 2018-03-24 MED ORDER — OCUVITE-LUTEIN PO CAPS
1.0000 | ORAL_CAPSULE | Freq: Every day | ORAL | Status: DC
  Filled 2018-03-24: qty 1

## 2018-03-24 MED ORDER — PROSIGHT PO TABS
1.0000 | ORAL_TABLET | Freq: Every day | ORAL | Status: DC
  Administered 2018-03-24 – 2018-03-28 (×5): 1 via ORAL
  Filled 2018-03-24 (×5): qty 1

## 2018-03-24 MED ORDER — ENSURE ENLIVE PO LIQD
237.0000 mL | Freq: Three times a day (TID) | ORAL | Status: DC
  Administered 2018-03-24 – 2018-03-28 (×4): 237 mL via ORAL

## 2018-03-24 MED ORDER — POTASSIUM CHLORIDE CRYS ER 20 MEQ PO TBCR
40.0000 meq | EXTENDED_RELEASE_TABLET | Freq: Once | ORAL | Status: AC
  Administered 2018-03-24: 40 meq via ORAL
  Filled 2018-03-24: qty 2

## 2018-03-24 NOTE — Progress Notes (Signed)
Initial Nutrition Assessment  DOCUMENTATION CODES:   Morbid obesity  INTERVENTION:  Ensure Enlive po TID, each supplement provides 350 kcal and 20 grams of protein (chocolate) MVI   NUTRITION DIAGNOSIS:   Inadequate oral intake related to altered GI function, nausea, vomiting as evidenced by energy intake < or equal to 50% for > or equal to 5 days, other (comment)(calorie count).    GOAL:   Patient will meet greater than or equal to 90% of their needs   MONITOR:   PO intake, Weight trends, Labs, I & O's, Diet advancement  REASON FOR ASSESSMENT:   Consult Calorie Count  ASSESSMENT:  47 year old female with past medical history of CHF, rheumatoid arthritis, DM, asthma, abdominal yeast infection, admitted with sigmoid diverticulitis with abscess.    Initiating 48hr calorie count   Patient reports not feeling well this morning with breakfast tray at bedside. Patient had consumed ~5% and reports an episode of n/v this morning. Patient stated she had eaten most of dinner last night which consisted of spaghetti, broccoli, and a role.   RD educated patient on foods that are easier to digest and assisted her in choosing foods from the menu for upcoming meals.   Patient likes chocolate ensure and reports no GI discomfort when she drinks them.   Procedures: Percutaneous drain placed in IR 12/4  Medications reviewed and include: Tums, Voltaren, Colace, Pepcid, Diflucan, Folic acid, novoLog (0-9 units) q4hr, Protonix  Labs: Glucose 112 (H) Ca 8.6 (L) w/o albumin for correction  Lab Results  Component Value Date   HGBA1C 6.2 (H) 03/15/2018      NUTRITION - FOCUSED PHYSICAL EXAM:    Most Recent Value  Orbital Region  Mild depletion  Upper Arm Region  No depletion  Thoracic and Lumbar Region  No depletion  Buccal Region  No depletion  Temple Region  Mild depletion  Clavicle Bone Region  No depletion  Clavicle and Acromion Bone Region  No depletion  Scapular Bone  Region  No depletion  Dorsal Hand  No depletion  Patellar Region  No depletion  Anterior Thigh Region  Unable to assess  Posterior Calf Region  Unable to assess  Edema (RD Assessment)  None  Hair  Reviewed  Eyes  Reviewed  Mouth  Reviewed [poor dentition]  Skin  Reviewed  Nails  Reviewed       Diet Order:   Diet Order            DIET SOFT Room service appropriate? Yes; Fluid consistency: Thin  Diet effective now              EDUCATION NEEDS:   Education needs have been addressed  Skin:  Skin Assessment: Reviewed RN Assessment(moisture associated skin damage; abdomen; groin)  Last BM:  03/22/2018  Height:   Ht Readings from Last 1 Encounters:  03/16/18 5\' 1"  (1.549 m)    Weight:   Wt Readings from Last 1 Encounters:  03/22/18 103 kg    Ideal Body Weight:  47.8 kg  BMI:  Body mass index is 42.91 kg/m.  Estimated Nutritional Needs:   Kcal:  1575-1840 (30-35 kcal/kg IBW+10%)  Protein:  68-84g/day  Fluid:  </=1.8L/day    14/11/19, RD, LDN  After Hours/Weekend Pager: [phone]

## 2018-03-24 NOTE — Progress Notes (Signed)
Referring Physician(s): Toth,P  Supervising Physician: Malachy Moan  Patient Status:  [name] - In-pt  Chief Complaint:  Abdominal/pelvic pain  Subjective: Patient up ambulating in hallway.  States she feels okay. Does have some intermittent pelvic discomfort.  Denies nausea or vomiting   Allergies: Clarithromycin; Hydrocodone; Ondansetron; Penicillins; and Tdap [tetanus-diphth-acell pertussis]  Medications: Prior to Admission medications   Medication Sig Start Date End Date Taking? Authorizing Provider  Adalimumab (HUMIRA) 20 MG/0.2ML PSKT Inject into the skin 2 (two) times a week.   Yes [provider]  albuterol (PROAIR HFA) 108 (90 BASE) MCG/ACT inhaler Inhale 2 puffs into the lungs every 6 (six) hours as needed for wheezing or shortness of breath.   Yes [provider]  albuterol (PROVENTIL) (2.5 MG/3ML) 0.083% nebulizer solution Take 2.5 mg by nebulization every 6 (six) hours as needed for wheezing or shortness of breath.   Yes [provider]  clotrimazole (LOTRIMIN) 1 % cream Apply 1 application topically 2 (two) times daily. 03/13/18  Yes [provider]  ferrous gluconate (FERGON) 324 MG tablet Take 324 mg by mouth daily.   Yes [provider]  folic acid (FOLVITE) 1 MG tablet Take 1 mg by mouth daily.   Yes [provider]  furosemide (LASIX) 40 MG tablet Take 3 tablets (120mg ) in the mornings and 2 tablets (80mg ) in the evenings by mouth daily Patient taking differently: Take 120 mg by mouth every morning.  05/20/17  Yes , PA-C  hydroxychloroquine (PLAQUENIL) 200 MG tablet Take 400 mg by mouth daily.    Yes [provider]  isosorbide-hydrALAZINE (BIDIL) 20-37.5 MG tablet Take 2 tablets by mouth 3 (three) times daily. 05/20/17  Yes Graciella Freer, PA-C  levocetirizine (XYZAL) 5 MG tablet Take 5 mg by mouth at bedtime.    Yes [provider]  metFORMIN (GLUCOPHAGE) 500  MG tablet Take 1,000 mg by mouth 2 (two) times daily with a meal.    Yes [provider]  methocarbamol (ROBAXIN) 500 MG tablet Take 500 mg by mouth 2 (two) times daily as needed (for muscle spasms.).    Yes [provider]  Methotrexate, PF, 25 MG/0.5ML SOAJ Inject 0.7 mLs into the skin once a week. On Friday   Yes [provider]  metoprolol succinate (TOPROL-XL) 50 MG 24 hr tablet Take 1.5 tabs daily 01/26/18  Yes Friday, MD  omeprazole (PRILOSEC) 40 MG capsule Take 40 mg by mouth daily before breakfast.  09/29/16  Yes [provider]  sacubitril-valsartan (ENTRESTO) 97-103 MG Take 1 tablet by mouth 2 (two) times daily. 05/20/17  Yes 10/01/16, PA-C  SYMBICORT 160-4.5 MCG/ACT inhaler Inhale 2 puffs into the lungs daily as needed (for respiratory issues.).  06/01/13  Yes [provider]  traMADol (ULTRAM) 50 MG tablet Take 1 tablet (50 mg total) by mouth 4 (four) times daily. pain Patient taking differently: Take 50 mg by mouth 4 (four) times daily as needed (for pain.).  03/05/12  Yes Rai, Ripudeep K, MD  triamcinolone cream (KENALOG) 0.1 % Apply 1 application topically 2 (two) times daily as needed (for ezcema.).   Yes [provider]  valACYclovir (VALTREX) 500 MG tablet Take 500 mg by mouth 2 (two) times daily as needed (for cold sores.).   Yes [provider]  spironolactone (ALDACTONE) 25 MG tablet Take 1 tablet (25 mg total) by mouth daily. 05/20/17 01/26/18  07/18/17, PA-C  Vital Signs: BP 119/73 (BP Location: Right Arm)   Pulse 76   Temp 98.4 F (36.9 C) (Oral)   Resp 16   Ht 5\' 1"  (1.549 m)   Wt 227 lb 1.2 oz (103 kg)   LMP 03/15/2018 Comment: negative urine pregnancy test 03-14-2018  SpO2 100%   BMI 42.91 kg/m   Physical Exam awake, alert.  Left transgluteal drain intact, approximately 10 cc of serous looking fluid in JP bulb.  insertion site okay, slightly tender  Imaging: Ct  Abdomen Pelvis W Contrast  Result Date: 03/22/2018 CLINICAL DATA:  Suprapubic abdominal pain. EXAM: CT ABDOMEN AND PELVIS WITH CONTRAST TECHNIQUE: Multidetector CT imaging of the abdomen and pelvis was performed using the standard protocol following bolus administration of intravenous contrast. CONTRAST:  14/02/2018 OMNIPAQUE IOHEXOL 300 MG/ML  SOLN COMPARISON:  CT scan of March 14, 2018. FINDINGS: Lower chest: No acute abnormality. Hepatobiliary: Cholelithiasis is again noted without inflammation. No hepatic lesion is noted. No biliary dilatation is noted. Pancreas: Unremarkable. No pancreatic ductal dilatation or surrounding inflammatory changes. Spleen: Normal in size without focal abnormality. Adrenals/Urinary Tract: Adrenal glands appear normal. Stable left renal cyst is noted. No hydronephrosis or renal obstruction is noted. Urinary bladder is unremarkable. Stomach/Bowel: Stomach and appendix are unremarkable. There is no evidence of bowel obstruction. Stable wall thickening and surrounding inflammatory changes are seen involving the sigmoid colon consistent with diverticulitis. There is been interval placement of 2 left transgluteal drainage catheter which passes through pre rectal fluid collection described on prior CT scan. This fluid collection now measures 4.2 x 2.7 cm in size and is significantly decreased compared to prior exam, although significant residual fluid and gas remains. Another ill-defined fluid collection is noted inferior to the sigmoid colon which measures 4.6 x 2.2 cm. Vascular/Lymphatic: No significant vascular findings are present. No enlarged abdominal or pelvic lymph nodes. Reproductive: Uterus and right adnexal regions are unremarkable. Left adnexal region is not well visualized due to inflammatory changes resulting from previously described sigmoid diverticulitis. Other: No hernia is noted. Musculoskeletal: No acute or significant osseous findings. IMPRESSION: Findings consistent  with sigmoid diverticulitis as described on prior exam. Interval placement of drainage catheter into pre rectal abscess seen on prior exam, which is significantly improved compared to prior exam. However, significant amount of residual fluid and gas remains within the fluid collection. There does appear to be interval development of ill-defined fluid collection inferior to the sigmoid colon which measures 4.6 x 2.2 cm which may represent developing abscess. Attention to on follow-up scans is recommended. Cholelithiasis. Electronically Signed   By: March 16, 2018, M.D.   On: 03/22/2018 15:41    Labs:  CBC: Recent Labs    03/20/18 0516 03/21/18 0351 03/22/18 0334 03/24/18 0455  WBC 14.7* 14.9* 14.3* 6.4  HGB 7.8* 7.5* 7.5* 8.0*  HCT 26.3* 24.8* 24.7* 25.7*  PLT 565* 571* 594* 662*    COAGS: Recent Labs    03/15/18 0519  INR 0.99    BMP: Recent Labs    03/21/18 0351 03/22/18 0334 03/23/18 0412 03/24/18 0455  NA 138 139 140 141  K 3.8 3.5 3.7 3.6  CL 111 109 109 107  CO2 20* 22 21* 23  GLUCOSE 114* 110* 106* 112*  BUN 13 17 20  23*  CALCIUM 8.3* 8.2* 8.3* 8.6*  CREATININE 0.80 0.71 0.86 0.93  GFRNONAA >60 >60 >60 >60  GFRAA >60 >60 >60 >60    LIVER FUNCTION TESTS: Recent Labs    03/19/18 0545  03/21/18 0351 03/22/18 0334 03/23/18 0412  BILITOT 0.6 0.5 0.3 0.5  AST 19 30 28 24   ALT 14 18 17 17   ALKPHOS 85 91 88 82  PROT 6.8 7.0 6.7 7.1  ALBUMIN 2.8* 3.0* 2.8* 2.9*    Assessment and Plan: Pt with hx sigmoid diverticulitis with assoc abscess, s/p drain placement 12/4; afebrile; WBC nl; hgb 8.0, creat nl; cont current tx; check f/u CT next week   Electronically Signed: D. Jeananne Rama, PA-C 03/24/2018, 5:28 PM   I spent a total of 15 minutes at the the patient's bedside AND on the patient's hospital floor or unit, greater than 50% of which was counseling/coordinating care for pelvic abscess drain    Patient ID: [name], female   DOB: 30-Jun-1970, 47  y.o.   MRN: [id number]

## 2018-03-24 NOTE — Progress Notes (Signed)
Patient ID: ELEASE [name], female   DOB: 09-11-1970, 47 y.o.   MRN: [id number]       Subjective: Pt states her pain is better.  Ate all of her supper last night.  Woke up at 0200am after eating at 1800pm and threw up.  She states that the protein shakes she tolerates well, but the solid food did make her hurt a little worse.  Objective: Vital signs in last 24 hours: Temp:  [97.7 F (36.5 C)-98.2 F (36.8 C)] 98 F (36.7 C) (12/13 0544) Pulse Rate:  [76-85] 76 (12/13 0544) Resp:  [18] 18 (12/13 0544) BP: (92-106)/(47-65) 92/47 (12/13 0544) SpO2:  [98 %-100 %] 99 % (12/13 0544) Last BM Date: 03/22/18  Intake/Output from previous day: 12/12 0701 - 12/13 0700 In: 559.9 [P.O.:360; IV Piggyback:199.9] Out: -  Intake/Output this shift: No intake/output data recorded.  PE: Heart: regular Lungs: CTAB Abd: soft, NT to palpation, obese, +BS, TG drain with slightly cloudy clear output.  Lab Results:  Recent Labs    03/22/18 0334 03/24/18 0455  WBC 14.3* 6.4  HGB 7.5* 8.0*  HCT 24.7* 25.7*  PLT 594* 662*   BMET Recent Labs    03/23/18 0412 03/24/18 0455  NA 140 141  K 3.7 3.6  CL 109 107  CO2 21* 23  GLUCOSE 106* 112*  BUN 20 23*  CREATININE 0.86 0.93  CALCIUM 8.3* 8.6*   PT/INR No results for input(s): LABPROT, INR in the last 72 hours. CMP     Component Value Date/Time   NA 141 03/24/2018 0455   NA 142 01/06/2017 1433   K 3.6 03/24/2018 0455   CL 107 03/24/2018 0455   CO2 23 03/24/2018 0455   GLUCOSE 112 (H) 03/24/2018 0455   BUN 23 (H) 03/24/2018 0455   BUN 16 01/06/2017 1433   CREATININE 0.93 03/24/2018 0455   CALCIUM 8.6 (L) 03/24/2018 0455   PROT 7.1 03/23/2018 0412   ALBUMIN 2.9 (L) 03/23/2018 0412   AST 24 03/23/2018 0412   ALT 17 03/23/2018 0412   ALKPHOS 82 03/23/2018 0412   BILITOT 0.5 03/23/2018 0412   GFRNONAA >60 03/24/2018 0455   GFRAA >60 03/24/2018 0455   Lipase     Component Value Date/Time   LIPASE 30 03/14/2018 1319        Studies/Results: Ct Abdomen Pelvis W Contrast  Result Date: 03/22/2018 CLINICAL DATA:  Suprapubic abdominal pain. EXAM: CT ABDOMEN AND PELVIS WITH CONTRAST TECHNIQUE: Multidetector CT imaging of the abdomen and pelvis was performed using the standard protocol following bolus administration of intravenous contrast. CONTRAST:  OMNIPAQUE IOHEXOL 300 MG/ML  SOLN COMPARISON:  CT scan of March 14, 2018. FINDINGS: Lower chest: No acute abnormality. Hepatobiliary: Cholelithiasis is again noted without inflammation. No hepatic lesion is noted. No biliary dilatation is noted. Pancreas: Unremarkable. No pancreatic ductal dilatation or surrounding inflammatory changes. Spleen: Normal in size without focal abnormality. Adrenals/Urinary Tract: Adrenal glands appear normal. Stable left renal cyst is noted. No hydronephrosis or renal obstruction is noted. Urinary bladder is unremarkable. Stomach/Bowel: Stomach and appendix are unremarkable. There is no evidence of bowel obstruction. Stable wall thickening and surrounding inflammatory changes are seen involving the sigmoid colon consistent with diverticulitis. There is been interval placement of 2 left transgluteal drainage catheter which passes through pre rectal fluid collection described on prior CT scan. This fluid collection now measures 4.2 x 2.7 cm in size and is significantly decreased compared to prior exam, although significant residual fluid and gas  remains. Another ill-defined fluid collection is noted inferior to the sigmoid colon which measures 4.6 x 2.2 cm. Vascular/Lymphatic: No significant vascular findings are present. No enlarged abdominal or pelvic lymph nodes. Reproductive: Uterus and right adnexal regions are unremarkable. Left adnexal region is not well visualized due to inflammatory changes resulting from previously described sigmoid diverticulitis. Other: No hernia is noted. Musculoskeletal: No acute or significant osseous findings.  IMPRESSION: Findings consistent with sigmoid diverticulitis as described on prior exam. Interval placement of drainage catheter into pre rectal abscess seen on prior exam, which is significantly improved compared to prior exam. However, significant amount of residual fluid and gas remains within the fluid collection. There does appear to be interval development of ill-defined fluid collection inferior to the sigmoid colon which measures 4.6 x 2.2 cm which may represent developing abscess. Attention to on follow-up scans is recommended. Cholelithiasis. Electronically Signed   By: Lupita Raider, M.D.   On: 03/22/2018 15:41    Anti-infectives: Anti-infectives (From admission, onward)   Start     Dose/Rate Route Frequency Ordered Stop   03/22/18 1000  levofloxacin (LEVAQUIN) IVPB 750 mg     750 mg 100 mL/hr over 90 Minutes Intravenous Every 24 hours 03/22/18 0846     03/18/18 1000  valACYclovir (VALTREX) tablet 500 mg     500 mg Oral 2 times daily 03/18/18 0806     03/17/18 1830  fluconazole (DIFLUCAN) tablet 100 mg     100 mg Oral Daily 03/17/18 1716     03/15/18 0400  ciprofloxacin (CIPRO) IVPB 400 mg  Status:  Discontinued     400 mg 200 mL/hr over 60 Minutes Intravenous Every 12 hours 03/14/18 1722 03/22/18 0845   03/14/18 1830  metroNIDAZOLE (FLAGYL) IVPB 500 mg     500 mg 100 mL/hr over 60 Minutes Intravenous Every 8 hours 03/14/18 1722     03/14/18 1830  fluconazole (DIFLUCAN) IVPB 100 mg  Status:  Discontinued     100 mg 50 mL/hr over 60 Minutes Intravenous Every 24 hours 03/14/18 1742 03/17/18 1716   03/14/18 1515  ciprofloxacin (CIPRO) IVPB 400 mg     400 mg 200 mL/hr over 60 Minutes Intravenous  Once 03/14/18 1512 03/14/18 1716   03/14/18 1515  metroNIDAZOLE (FLAGYL) IVPB 500 mg  Status:  Discontinued     500 mg 100 mL/hr over 60 Minutes Intravenous  Once 03/14/18 1512 03/14/18 1808       Assessment/Plan Rheumatoid arthritis, on Humira, Plaquenil, and  methotrexate Nonischemic cardiomyopathy with an EF now up to 40-45% Hypertension Acute kidney injury Hx diabetes mellitus-currently on no treatment for this. Obesity  Sigmoid diverticulitis with 5.4 x 4.4 cm pre-rectal abscess/fluid collection, ? UTI vs colovesical fistula -IRplaced TG drain. CX shows WBC with rare gram + cocci in pairs and chains.  CX shows STREP VIRIDANS -cont abx therapy as new CT shows persistent changes of diverticulitis.  Improvement in collection where drain is, but a new ill-defined collection is present and not able to be drained.   -repeat UA today as urine on macroscopic exam appears clear and straw colored.  Unclear if first test was accurate. -WBC normalized today.   -had some emesis overnight, but patient not wanting to return to full liquids as she turns her nose up at the food option.  Given this was just after one meal, we will let her try solid food again today and see how she does. If her pain worsens or she continues to vomit,  then I told her we will have to back her diet back down. -prealbumin is 11.1 indicating mild malnutrition.  Cont protein shakes and diet for now.  FEN -IVFs/soft diet VTE -SCDs/Lovenox ID -Cipro 12/3 --> 12/11, Flagyl 12/3 -->, Levaquin 12/11 -->   LOS: 10 days    Letha Cape , [facility] Surgery 03/24/2018, 8:11 AM Pager: [phone]

## 2018-03-24 NOTE — Progress Notes (Signed)
PROGRESS NOTE    [name]:[id number] DOB: 1970/07/12 DOA: 03/14/2018 PCP: Shirlean Mylar, MD    Brief Narrative:  HPI per Dr. Osvaldo Shipper on 03/14/18 [name] a 47 y.o.femalewith a past medical history of chronic systolic CHF with ejection fraction of 40 to 45% based on echocardiogram from October, history of rheumatoid arthritis followed by rheumatology in [facility] on multiple immunosuppressants, history of diabetes mellitus type 2, history of asthma who developed yeast infection in her lower abdomen and in the groin area for which she has been taking antifungal tablet as well as an antifungal cream. This has been ongoing for about 2 weeks. She has had a lot of burning sensation in the lower part at the site of these lesions. She tells me that with treatment the lesions have been improving. And then about 3 days ago she started developing pain in the lower abdomen which is slightly different. She had fever and chills last night although she did not check her temperature. The pain was 8 out of 10 in intensity. She had nausea and vomiting x1. She had a loose stool yesterday. She has hadpoor oral intake.  In the emergency department she underwent a CT scan which revealed sigmoid diverticulitis with fluid collection suspicious for abscess. She was hospitalized for further management. General surgery was consulted. Urine pregnancy test was negative. Last menstrual period was 12th ofNovember.  **She is status post CT drain placement via transgluteal approach on the left side for her pelvic abscess.  Her main complaint now lower abdominal pain. Pain medications are now attempting to be weaned and her Diuretics have been slowly started to be added back and she was placed back on po Lasix. She got a dose of IV Ketorlac and patient states it has helped with her pain so she will get another round today of 15 mg IV q12hprn x2 doses. Surgery and IR recommending repeating a CT  Scan and it its to be done today.   Assessment & Plan:   Principal Problem:   Perforation of sigmoid colon due to diverticulitis Active Problems:   Rheumatoid arthritis (HCC)   Chronic systolic heart failure (HCC)   Abscess of sigmoid colon due to diverticulitis s/p perc drainage 03/15/2018   Diverticulitis of large intestine with abscess without bleeding  Acute Sigmoid Diverticulitis with Abscess status post percutaneous drain placement via transgluteal approach, improving  -CT of the abdomen pelvis with contrast showed sigmoid diverticulitis resulting in secondary inflammation and thickening of the adjacent small bowel loops.  There is a 5.2 x 4.4 cm probable abscess in the perirectal space -General surgery has been consulted and they recommend broad-spectrum IV antibiotic and percutaneous drainage of abscess.  IR was consulted and patient underwent percutaneous drain placement by Dr. Miles Costain on 03/15/18 -General surgery recommending continue IV antibiotic therapy for now. -Patient currently on soft diet per general surgery.  Patient however states when she tries regular food has some abdominal pain and somewhat hesitant. -With some emesis noted 03/22/2018, is some concern from diarrhea or stool from the urine. -Patient was placed on IV Ciprofloxacin and Metronidazole and dose have been changed to IV Levaquin and metronidazole. She is allergic to penicillin.  -Due to concern for yeast infection she was placed on Diflucan as well IV but this has been changed to po  -Her WBC is noted to be elevated from infection and WBC peaked to 24,100 and slightly up from 22,500 on admission; Now was trending down and  currently at 14.3 on 03/22/2018.  -She is afebrile but has slightlyelevated heart rate.No clearsepsis is identified at this time. Otherwise she is hemodynamically stable.  -Blood cultures x2 show NGTD at 5 Days -IR placed drain and aspirated 13 cc of fluid that was sent off for  Analysis; Aspirate Gram stain showed few WBCs both PMNs of mononuclear with rare gram-positive cocci in pairs and chains. Cx grew out Few Viridians Streptococcus. IR recommending continued every shift flushing and monitoring of output -Repeat UA with cultures and sensitivities pending. -Repeat CT abdomen and pelvis with initial abscess smaller and concern for new fluid collection with still some inflammation.  Urine cultures pending.  -Pain control with IV morphine 2 mg to 3 PRN chagned to 1 mg q 4PRN for severe pain along with methocarbamol 500 mg p.o. twice daily PRN for muscle spasm. -Continue empiric IV antibiotics. -Per general surgery continue current treatment of IV antibiotics and will likely need repeat CT abdomen and pelvis in a few days. -General surgery following and appreciate input and recommendations.  Back Pain -Likely in the setting of where her drain is placed -Continue Lidoderm patch and diclofenac -Continue with IV morphine but increased frequency from 2 mg every 4 hours as needed severe pain to every 3 hours as needed yesterday but will scale back to 1 mg q4h PRN and will continue po Tramadol 40 mg po 4 times Daily PRN -Continue with methocarbamol 500 mg p.o. twice daily PRN for muscle spasms -Gave 1x of IV Ketorolac 30 mg daily for the last few days and IV Ketorolac 15 mg q12hprn.  Yeast Infection -She has skin lesions in the lower abdomen and in the groin area.  -This is been treated by her primary care provider with antifungal agents.  -C/w Topical Antifungal Agents with Clotrimazole 1 application topically BID -C/w Fluconazole 100 mg IV q24h and change to po now that diet has been advanced. She is on Day 9/10 of Fluconazole  Chronic Systolic CHF -EF noted to be 40 to 45% based on last echocardiogram done in October.  -She is followed by the heart failure clinic.  -Her elevated BUN and creatinine suggest a degree of hypovolemia.  -She was gently hydrated and now  IVF has stopped. - -Lasix, Entresto, Toprol-XL, Imdur, spironolactone have been resumed.   -I's and O's not properly measured.   -Blood pressure somewhat borderline however patient currently asymptomatic and will monitor for now while on cardiac medication.   -Strict I's and O's.  Daily weights.  -May need to consult Cardiology if there is need for surgical intervention.  Mild Acute Renal Failure, improved  -Likely secondary to prerenal azotemia.  Renal function improved with hydration.   - BUN and creatinine noted to be higher than her usual baseline and was 40/1.21 on Admission.  -Given Gentle IVF hydration for 12 hours and now discontinued. -Resumed Home Entresto, Lasix, spironolactone, Imdur. -Follow. -Monitor blood pressure closely.  Well-controlled diabetes Mellitus Type 2 -Hemoglobin A1c 6.2 on 03/15/2018.   -CBG 112 this morning.   -Patient on soft diet per general surgery.   -Continue to hold oral hypoglycemic agents.  Rheumatoid Arthritis -Patient on multiple immunosuppressants.  -Has not been on steroids in the last 6 months. She takes steroids only as needed for flareups.  -She is noted to be on Methotrexate andHydroxychloroquine.  -She gets Humira injections but has not received them in a few weeks.  -Continue to hold immunosuppressive medications due to acute infection.  -She is followed  by Rheumatology in [facility] and will need outpatient follow up at D/C.  Normocytic Anemia -Anemia panel done and showed an iron level of 10, U IBC of 255, TIBC of 265, saturation ratios of 4%, ferritin level 460, folate level 20.2, and vitamin B12 level 118 -Hemoglobin lower than her baseline on admission. -Hemoglobin currently at 8.  -Hold off on IV iron at this time.   -Will likely need oral iron supplementation on discharge.   -Transfusion threshold hemoglobin less than 7.   Morbid Obesity -Estimated body mass index is 44.11 kg/m as calculated from the following:    Height as of this encounter: 5\' 1"  (1.549 m).   Weight as of this encounter: 105.9 kg.  -Weight loss Counseling given   Hypokalemia -Patient potassium is 3.6 -Replete as needed.   -Follow.    Hypomagnesemia -Magnesium level at 2.4.    Hypophosphatemia -Patient's Phos Level currently at 3.3.  Thrombocytosis -Patient's platelet count increasing and currently at 662.  Likely secondary to acute infection.  Place on low-dose aspirin.-Follow.    -Abnormal urinalysis ??  For colovesicular fistula.  Urine cultures pending.  Continue empiric IV Levaquin.    DVT prophylaxis: Lovenox Code Status: Full Family Communication: Updated patient.  No family at bedside. Disposition Plan: Likely home when clinically improved and per general surgery.   Consultants:   General surgery: Dr. Johna Sheriff 03/14/2018  Interventional radiology: Dr. Miles Costain 03/15/2018  Procedures:  CT abdomen and pelvis 03/14/2018  CT abdomen and pelvis 03/22/2018  CT-guided left transgluteal pelvic abscess drain insertion per Dr. Miles Costain 03/15/2018  Antimicrobials:   Ciprofloxacin 03/15/2018>>>>>> 03/22/2018  IV Levaquin 03/22/2018   Subjective: Patient sitting up in chair.  States had an episode of emesis early on this morning.  Abdominal pain slowly improving.  Patient states when she eats solid food she develops abdominal pain.   Objective: Vitals:   03/23/18 2005 03/23/18 2033 03/24/18 0544 03/24/18 1000  BP:  106/65 (!) 92/47 103/60  Pulse:  79 76 73  Resp:  18 18   Temp:  97.7 F (36.5 C) 98 F (36.7 C)   TempSrc:  Oral Oral   SpO2: 98% 100% 99%   Weight:      Height:        Intake/Output Summary (Last 24 hours) at 03/24/2018 1334 Last data filed at 03/24/2018 0133 Gross per 24 hour  Intake 99.94 ml  Output -  Net 99.94 ml   Filed Weights   03/18/18 0416 03/20/18 0419 03/22/18 1405  Weight: 104.1 kg 105.9 kg 103 kg    Examination:  General exam: NAD  Respiratory system: CTAB. No  wheezes, no crackles, no rhonchi.  Respiratory effort normal. Cardiovascular system: RRR no murmurs rubs or gallops.  No JVD.  No lower extremity edema.  Gastrointestinal system: Abdomen is nontender, nondistended, soft, positive bowel sounds.  JP drain intact.  No rebound.  No guarding.  Central nervous system: Alert and oriented. No focal neurological deficits. Extremities: Symmetric 5 x 5 power. Skin: No rashes, lesions or ulcers Psychiatry: Judgement and insight appear fair. Mood & affect appropriate.     Data Reviewed: I have personally reviewed following labs and imaging studies  CBC: Recent Labs  Lab 03/19/18 0545 03/20/18 0516 03/21/18 0351 03/22/18 0334 03/24/18 0455  WBC 10.7* 14.7* 14.9* 14.3* 6.4  NEUTROABS 7.1 10.8* 11.1* 10.9* 3.9  HGB 7.5* 7.8* 7.5* 7.5* 8.0*  HCT 24.9* 26.3* 24.8* 24.7* 25.7*  MCV 96.1 98.1 96.1 95.0 93.5  PLT 514*  565* 571* 594* 662*   Basic Metabolic Panel: Recent Labs  Lab 03/18/18 0849 03/19/18 0545 03/20/18 0516 03/21/18 0351 03/22/18 0334 03/23/18 0412 03/24/18 0455  NA 139 139 139 138 139 140 141  K 3.5 3.4* 4.5 3.8 3.5 3.7 3.6  CL 110 109 113* 111 109 109 107  CO2 21* 20* 18* 20* 22 21* 23  GLUCOSE 126* 145* 135* 114* 110* 106* 112*  BUN 23* 23* 15 13 17 20  23*  CREATININE 0.80 0.91 0.70 0.80 0.71 0.86 0.93  CALCIUM 8.8* 8.2* 8.2* 8.3* 8.2* 8.3* 8.6*  MG 1.9 1.6* 1.9 1.7 1.5* 2.4  --   PHOS 3.3 3.1 2.2* 3.2 3.3  --   --    GFR: Estimated Creatinine Clearance: 84.3 mL/min (by C-G formula based on SCr of 0.93 mg/dL). Liver Function Tests: Recent Labs  Lab 03/18/18 0849 03/19/18 0545 03/21/18 0351 03/22/18 0334 03/23/18 0412  AST 18 19 30 28 24   ALT 14 14 18 17 17   ALKPHOS 97 85 91 88 82  BILITOT 0.3 0.6 0.5 0.3 0.5  PROT 8.0 6.8 7.0 6.7 7.1  ALBUMIN 3.2* 2.8* 3.0* 2.8* 2.9*   No results for input(s): LIPASE, AMYLASE in the last 168 hours. No results for input(s): AMMONIA in the last 168 hours. Coagulation  Profile: No results for input(s): INR, PROTIME in the last 168 hours. Cardiac Enzymes: No results for input(s): CKTOTAL, CKMB, CKMBINDEX, TROPONINI in the last 168 hours. BNP (last 3 results) No results for input(s): PROBNP in the last 8760 hours. HbA1C: No results for input(s): HGBA1C in the last 72 hours. CBG: Recent Labs  Lab 03/23/18 2030 03/24/18 0001 03/24/18 0417 03/24/18 0756 03/24/18 1203  GLUCAP 122* 111* 105* 112* 150*   Lipid Profile: No results for input(s): CHOL, HDL, LDLCALC, TRIG, CHOLHDL, LDLDIRECT in the last 72 hours. Thyroid Function Tests: No results for input(s): TSH, T4TOTAL, FREET4, T3FREE, THYROIDAB in the last 72 hours. Anemia Panel: No results for input(s): VITAMINB12, FOLATE, FERRITIN, TIBC, IRON, RETICCTPCT in the last 72 hours. Sepsis Labs: No results for input(s): PROCALCITON, LATICACIDVEN in the last 168 hours.  Recent Results (from the past 240 hour(s))  Culture, blood (Routine X 2) w Reflex to ID Panel     Status: None   Collection Time: 03/14/18  7:12 PM  Result Value Ref Range Status   Specimen Description   Final    BLOOD RIGHT ARM Performed at [facility], [address] [place], [place], [place]    Special Requests   Final    BOTTLES DRAWN AEROBIC AND ANAEROBIC Blood Culture adequate volume Performed at [facility], [address] [place], [place]    Culture   Final    NO GROWTH 5 DAYS Performed at [facility], [address], [place], [facility] [address]    Report Status 03/19/2018 FINAL  Final  Culture, blood (Routine X 2) w Reflex to ID Panel     Status: None   Collection Time: 03/14/18  7:12 PM  Result Value Ref Range Status   Specimen Description   Final    BLOOD RIGHT ARM Performed at [facility], [address], [place], [place]    Special Requests   Final    BOTTLES DRAWN AEROBIC AND ANAEROBIC Blood Culture adequate  volume Performed at [facility], [address] [place], [place]    Culture   Final    NO  GROWTH 5 DAYS Performed at [facility], [address], [place], [place] [address]    Report Status 03/19/2018 FINAL  Final  MRSA PCR Screening     Status: None   Collection Time: 03/15/18  6:36 AM  Result Value Ref Range Status   MRSA by PCR NEGATIVE NEGATIVE Final    Comment:        The GeneXpert MRSA Assay (FDA approved for NASAL specimens only), is one component of a comprehensive MRSA colonization surveillance program. It is not intended to diagnose MRSA infection nor to guide or monitor treatment for MRSA infections. Performed at [facility], [address], [place], [place] [address]   Aerobic/Anaerobic Culture (surgical/deep wound)     Status: None   Collection Time: 03/15/18 11:51 AM  Result Value Ref Range Status   Specimen Description   Final    ABSCESS LEFT BUTTOCKS Performed at [facility], [address], [place], [place] [address]    Special Requests   Final    Normal Performed at [facility], [address], [place], [place] [address]    Gram Stain   Final    FEW WBC PRESENT,BOTH PMN AND MONONUCLEAR RARE GRAM POSITIVE COCCI IN PAIRS AND CHAINS    Culture   Final    FEW VIRIDANS STREPTOCOCCUS NO ANAEROBES ISOLATED Performed at [facility], [address], [place], [place] [address]    Report Status 03/20/2018 FINAL  Final  Culture, Urine     Status: None   Collection Time: 03/22/18  5:50 PM  Result Value Ref Range Status   Specimen Description   Final    URINE, RANDOM Performed at [facility], [address], [place], [place] [address]    Special Requests   Final    NONE Performed at [facility], [address] [place], [place] [address]    Culture   Final    Multiple bacterial morphotypes present, none  predominant. Suggest appropriate recollection if clinically indicated.   Report Status 03/23/2018 FINAL  Final         Radiology Studies: Ct Abdomen Pelvis W Contrast  Result Date: 03/22/2018 CLINICAL DATA:  Suprapubic abdominal pain. EXAM: CT ABDOMEN AND PELVIS WITH CONTRAST TECHNIQUE: Multidetector CT imaging of the abdomen and pelvis was performed using the standard protocol following bolus administration of intravenous contrast. CONTRAST:  OMNIPAQUE IOHEXOL 300 MG/ML  SOLN COMPARISON:  CT scan of March 14, 2018. FINDINGS: Lower chest: No acute abnormality. Hepatobiliary: Cholelithiasis is again noted without inflammation. No hepatic lesion is noted. No biliary dilatation is noted. Pancreas: Unremarkable. No pancreatic ductal dilatation or surrounding inflammatory changes. Spleen: Normal in size without focal abnormality. Adrenals/Urinary Tract: Adrenal glands appear normal. Stable left renal cyst is noted. No hydronephrosis or renal obstruction is noted. Urinary bladder is unremarkable. Stomach/Bowel: Stomach and appendix are unremarkable. There is no evidence of bowel obstruction. Stable wall thickening and surrounding inflammatory changes are seen involving the sigmoid colon consistent with diverticulitis. There is been interval placement of 2 left transgluteal drainage catheter which passes through pre rectal fluid collection described on prior CT scan. This fluid collection now measures 4.2 x 2.7 cm in size and is significantly decreased compared to prior exam, although significant residual fluid and gas remains. Another ill-defined fluid collection is noted inferior to the sigmoid colon which measures 4.6 x 2.2 cm. Vascular/Lymphatic: No significant vascular findings are present. No enlarged abdominal or  pelvic lymph nodes. Reproductive: Uterus and right adnexal regions are unremarkable. Left adnexal region is not well visualized due to inflammatory changes resulting from previously  described sigmoid diverticulitis. Other: No hernia is noted. Musculoskeletal: No acute or significant osseous findings. IMPRESSION: Findings consistent with sigmoid diverticulitis as described on prior exam. Interval placement of drainage catheter into pre rectal abscess seen on prior exam, which is significantly improved compared to prior exam. However, significant amount of residual fluid and gas remains within the fluid collection. There does appear to be interval development of ill-defined fluid collection inferior to the sigmoid colon which measures 4.6 x 2.2 cm which may represent developing abscess. Attention to on follow-up scans is recommended. Cholelithiasis. Electronically Signed   By: Lupita Raider, M.D.   On: 03/22/2018 15:41        Scheduled Meds: . acetaminophen  1,000 mg Oral Q6H  . calcium carbonate  1 tablet Oral BID  . cetirizine  10 mg Oral QHS  . clotrimazole  1 application Topical BID  . diclofenac sodium  2 g Topical QID  . docusate sodium  100 mg Oral Daily  . enoxaparin (LOVENOX) injection  40 mg Subcutaneous Q24H  . famotidine  20 mg Oral BID  . feeding supplement (ENSURE ENLIVE)  237 mL Oral TID BM  . fluconazole  100 mg Oral Daily  . folic acid  1 mg Oral Daily  . furosemide  120 mg Oral Daily  . insulin aspart  0-9 Units Subcutaneous Q4H  . isosorbide-hydrALAZINE  2 tablet Oral TID  . lidocaine  1 patch Transdermal Q24H  . metoprolol succinate  50 mg Oral Daily  . mometasone-formoterol  2 puff Inhalation BID  . multivitamin  1 tablet Oral Daily  . pantoprazole  40 mg Oral Daily  . sacubitril-valsartan  1 tablet Oral BID  . sodium chloride flush  3 mL Intravenous Q12H  . sodium chloride flush  5 mL Intracatheter Q8H  . valACYclovir  500 mg Oral BID   Continuous Infusions: . sodium chloride 10 mL/hr at 03/19/18 1800  . levofloxacin (LEVAQUIN) IV 750 mg (03/24/18 1311)  . metronidazole 500 mg (03/24/18 0950)     LOS: 10 days    Time spent: 40  minutes    Ramiro Harvest, MD [facility] Pager [phone]  If 7PM-7AM, please contact night-coverage [email or web address] Password [email or web address] 03/24/2018, 1:34 PM

## 2018-03-25 LAB — GLUCOSE, CAPILLARY
Glucose-Capillary: 112 mg/dL — ABNORMAL HIGH (ref 70–99)
Glucose-Capillary: 121 mg/dL — ABNORMAL HIGH (ref 70–99)
Glucose-Capillary: 144 mg/dL — ABNORMAL HIGH (ref 70–99)
Glucose-Capillary: 79 mg/dL (ref 70–99)
Glucose-Capillary: 93 mg/dL (ref 70–99)

## 2018-03-25 LAB — CBC
HCT: 24.4 % — ABNORMAL LOW (ref 36.0–46.0)
Hemoglobin: 7.4 g/dL — ABNORMAL LOW (ref 12.0–15.0)
MCH: 28.2 pg (ref 26.0–34.0)
MCHC: 30.3 g/dL (ref 30.0–36.0)
MCV: 93.1 fL (ref 80.0–100.0)
NRBC: 0 % (ref 0.0–0.2)
Platelets: 655 10*3/uL — ABNORMAL HIGH (ref 150–400)
RBC: 2.62 MIL/uL — AB (ref 3.87–5.11)
RDW: 17.6 % — ABNORMAL HIGH (ref 11.5–15.5)
WBC: 5.1 10*3/uL (ref 4.0–10.5)

## 2018-03-25 LAB — URINE CULTURE: Culture: NO GROWTH

## 2018-03-25 LAB — BASIC METABOLIC PANEL
Anion gap: 11 (ref 5–15)
BUN: 18 mg/dL (ref 6–20)
CO2: 24 mmol/L (ref 22–32)
Calcium: 8.4 mg/dL — ABNORMAL LOW (ref 8.9–10.3)
Chloride: 105 mmol/L (ref 98–111)
Creatinine, Ser: 0.9 mg/dL (ref 0.44–1.00)
GFR calc Af Amer: >60 mL/min (ref >60)
GFR calc non Af Amer: >60 mL/min (ref >60)
Glucose, Bld: 122 mg/dL — ABNORMAL HIGH (ref 70–99)
Potassium: 3.7 mmol/L (ref 3.5–5.1)
SODIUM: 140 mmol/L (ref 135–145)

## 2018-03-25 MED ORDER — ASPIRIN EC 81 MG PO TBEC
81.0000 mg | DELAYED_RELEASE_TABLET | Freq: Every day | ORAL | Status: DC
  Administered 2018-03-25 – 2018-03-28 (×4): 81 mg via ORAL
  Filled 2018-03-25 (×4): qty 1

## 2018-03-25 NOTE — Progress Notes (Signed)
PROGRESS NOTE    [name]  CZY:[id number] DOB: [birth year] DOA: 03/14/2018 PCP: Shirlean Mylar, MD    Brief Narrative:  HPI per Dr. Osvaldo Shipper on 03/14/18 [name] a 47 y.o.femalewith a past medical history of chronic systolic CHF with ejection fraction of 40 to 45% based on echocardiogram from October, history of rheumatoid arthritis followed by rheumatology in [facility] on multiple immunosuppressants, history of diabetes mellitus type 2, history of asthma who developed yeast infection in her lower abdomen and in the groin area for which she has been taking antifungal tablet as well as an antifungal cream. This has been ongoing for about 2 weeks. She has had a lot of burning sensation in the lower part at the site of these lesions. She tells me that with treatment the lesions have been improving. And then about 3 days ago she started developing pain in the lower abdomen which is slightly different. She had fever and chills last night although she did not check her temperature. The pain was 8 out of 10 in intensity. She had nausea and vomiting x1. She had a loose stool yesterday. She has hadpoor oral intake.  In the emergency department she underwent a CT scan which revealed sigmoid diverticulitis with fluid collection suspicious for abscess. She was hospitalized for further management. General surgery was consulted. Urine pregnancy test was negative. Last menstrual period was 12th ofNovember.  **She is status post CT drain placement via transgluteal approach on the left side for her pelvic abscess.  Her main complaint now lower abdominal pain. Pain medications are now attempting to be weaned and her Diuretics have been slowly started to be added back and she was placed back on po Lasix. She got a dose of IV Ketorlac and patient states it has helped with her pain so she will get another round today of 15 mg IV q12hprn x2 doses. Surgery and IR recommending repeating a CT  Scan and it its to be done today.   Assessment & Plan:   Principal Problem:   Perforation of sigmoid colon due to diverticulitis Active Problems:   Rheumatoid arthritis (HCC)   Chronic systolic heart failure (HCC)   Abscess of sigmoid colon due to diverticulitis s/p perc drainage 03/15/2018   Diverticulitis of large intestine with abscess without bleeding  Acute Sigmoid Diverticulitis with Abscess status post percutaneous drain placement via transgluteal approach, improving  -CT of the abdomen pelvis with contrast showed sigmoid diverticulitis resulting in secondary inflammation and thickening of the adjacent small bowel loops.  There is a 5.2 x 4.4 cm probable abscess in the perirectal space -General surgery has been consulted and they recommend broad-spectrum IV antibiotic and percutaneous drainage of abscess.  IR was consulted and patient underwent percutaneous drain placement by Dr. Miles Costain on 03/15/18 -General surgery recommending continue IV antibiotic therapy for now. -Patient currently on soft diet per general surgery.  Patient however states when she tries regular food has some abdominal pain and somewhat hesitant. -With some emesis noted 03/22/2018, is some concern from diarrhea or stool from the urine. -Patient was placed on IV Ciprofloxacin and Metronidazole and dose have been changed to IV Levaquin and metronidazole. She is allergic to penicillin.  -Due to concern for yeast infection she was placed on Diflucan as well IV but this has been changed to po  -Her WBC is noted to be elevated from infection and WBC peaked to 24,100 and slightly up from 22,500 on admission; Now was trending down and  currently at 5.1 on 03/25/2018.  -She is afebrile but has slightlyelevated heart rate.No clearsepsis is identified at this time. Otherwise she is hemodynamically stable.  -Blood cultures x2 show NGTD at 5 Days -IR placed drain and aspirated 13 cc of fluid that was sent off for  Analysis; Aspirate Gram stain showed few WBCs both PMNs of mononuclear with rare gram-positive cocci in pairs and chains. Cx grew out Few Viridians Streptococcus. IR recommending continued every shift flushing and monitoring of output -Repeat UA with cultures and sensitivities pending. -Repeat CT abdomen and pelvis with initial abscess smaller and concern for new fluid collection with still some inflammation.  Urine cultures pending.  -Pain control with IV morphine 2 mg to 3 PRN chagned to 1 mg q 4PRN for severe pain along with methocarbamol 500 mg p.o. twice daily PRN for muscle spasm. -Continue empiric IV antibiotics. -Per general surgery continue current treatment of IV antibiotics and will likely need repeat CT abdomen and pelvis in a few days. -General surgery following and appreciate input and recommendations.  Back Pain -Likely in the setting of where her drain is placed -Continue Lidoderm patch and diclofenac -Continue with IV morphine but increased frequency from 2 mg every 4 hours as needed severe pain to every 3 hours as needed yesterday but will scale back to 1 mg q4h PRN and will continue po Tramadol 40 mg po 4 times Daily PRN -Continue with methocarbamol 500 mg p.o. twice daily PRN for muscle spasms -Gave 1x of IV Ketorolac 30 mg daily for the last few days and IV Ketorolac 15 mg q12hprn.  Yeast Infection -She has skin lesions in the lower abdomen and in the groin area.  -This is been treated by her primary care provider with antifungal agents.  -C/w Topical Antifungal Agents with Clotrimazole 1 application topically BID -C/w Fluconazole 100 mg IV q24h and change to po now that diet has been advanced. She is on Day 10/10 of Fluconazole  Chronic Systolic CHF -EF noted to be 40 to 45% based on last echocardiogram done in October.  -She is followed by the heart failure clinic.  -Her elevated BUN and creatinine suggest a degree of hypovolemia.  -She was gently hydrated and  now IVF has stopped. - -Lasix, Entresto, Toprol-XL, Imdur, spironolactone have been resumed.   -I's and O's not properly measured.   -Blood pressure somewhat borderline however patient currently asymptomatic and will monitor for now while on cardiac medication.   -Strict I's and O's.  Daily weights.  -May need to consult Cardiology if there is need for surgical intervention.  Mild Acute Renal Failure, improved  -Likely secondary to prerenal azotemia.  Renal function improved with hydration.   - BUN and creatinine noted to be higher than her usual baseline and was 40/1.21 on Admission.  -Given Gentle IVF hydration for 12 hours and now discontinued. -Resumed Home Entresto, Lasix, spironolactone, Imdur. -Follow. -Monitor blood pressure closely.  Well-controlled diabetes Mellitus Type 2 -Hemoglobin A1c 6.2 on 03/15/2018.   -CBG 93 this morning.   -Patient on soft diet per general surgery.   -Continue to hold oral hypoglycemic agents.  Rheumatoid Arthritis -Patient on multiple immunosuppressants.  -Has not been on steroids in the last 6 months. She takes steroids only as needed for flareups.  -She is noted to be on Methotrexate andHydroxychloroquine.  -She gets Humira injections but has not received them in a few weeks.  -Continue to hold immunosuppressive medications due to acute infection.  -She is followed  by Rheumatology in [facility] and will need outpatient follow up at D/C.  Normocytic Anemia -Anemia panel done and showed an iron level of 10, U IBC of 255, TIBC of 265, saturation ratios of 4%, ferritin level 460, folate level 20.2, and vitamin B12 level 118 -Hemoglobin lower than her baseline on admission. -Hemoglobin currently at 7.4.  -Hold off on IV iron at this time.   -Will likely need oral iron supplementation on discharge.   -Transfusion threshold hemoglobin less than 7.   Morbid Obesity -Estimated body mass index is 44.11 kg/m as calculated from the  following:   Height as of this encounter: 5\' 1"  (1.549 m).   Weight as of this encounter: 105.9 kg.  -Weight loss Counseling given   Hypokalemia -Patient potassium is 3.7 -Replete as needed.   -Follow.    Hypomagnesemia -Magnesium level at 2.4.    Hypophosphatemia -Patient's Phos Level currently at 3.7.  Thrombocytosis -Patient's platelet count increasing and currently at 655.  Likely secondary to acute infection.  Place on low-dose aspirin.-Follow.    -Abnormal urinalysis ??  For colovesicular fistula.  Urine cultures negative.     DVT prophylaxis: Lovenox Code Status: Full Family Communication: Updated patient.  No family at bedside. Disposition Plan: Likely home when clinically improved and per general surgery.   Consultants:   General surgery: Dr. Johna Sheriff 03/14/2018  Interventional radiology: Dr. Miles Costain 03/15/2018  Procedures:  CT abdomen and pelvis 03/14/2018, 03/22/2018  CT abdomen and pelvis 03/22/2018  CT-guided left transgluteal pelvic abscess drain insertion per Dr. Miles Costain 03/15/2018  Antimicrobials:   Ciprofloxacin 03/15/2018>>>>>> 03/22/2018  IV Levaquin 03/22/2018   Subjective: Patient sitting up in chair.  States she was able to tolerate soft diet today.  Abdominal pain improving.  No nausea or emesis.    Objective: Vitals:   03/24/18 1433 03/24/18 1941 03/24/18 2207 03/25/18 0522  BP: 119/73  104/62 105/63  Pulse: 76  84 80  Resp: 16  17 16   Temp: 98.4 F (36.9 C)  97.9 F (36.6 C) 98 F (36.7 C)  TempSrc: Oral  Oral Oral  SpO2: 100% 98% 99% 98%  Weight:    103.8 kg  Height:        Intake/Output Summary (Last 24 hours) at 03/25/2018 1234 Last data filed at 03/25/2018 1029 Gross per 24 hour  Intake 2396.53 ml  Output -  Net 2396.53 ml   Filed Weights   03/20/18 0419 03/22/18 1405 03/25/18 0522  Weight: 105.9 kg 103 kg 103.8 kg    Examination:  General exam: NAD  Respiratory system: Lungs are to auscultation bilaterally.   No wheezes, no crackles, no rhonchi.  Normal respiratory effort. Cardiovascular system: Regular rate rhythm no murmurs rubs or gallops.  No JVD.  No lower extremity edema. Gastrointestinal system: Abdomen is soft, nontender, nondistended, positive bowel sounds.  No rebound.  No guarding.  JP drain intact. Central nervous system: Alert and oriented. No focal neurological deficits. Extremities: Symmetric 5 x 5 power. Skin: No rashes, lesions or ulcers Psychiatry: Judgement and insight appear fair. Mood & affect appropriate.     Data Reviewed: I have personally reviewed following labs and imaging studies  CBC: Recent Labs  Lab 03/19/18 0545 03/20/18 0516 03/21/18 0351 03/22/18 0334 03/24/18 0455 03/25/18 0537  WBC 10.7* 14.7* 14.9* 14.3* 6.4 5.1  NEUTROABS 7.1 10.8* 11.1* 10.9* 3.9  --   HGB 7.5* 7.8* 7.5* 7.5* 8.0* 7.4*  HCT 24.9* 26.3* 24.8* 24.7* 25.7* 24.4*  MCV 96.1 98.1 96.1  95.0 93.5 93.1  PLT 514* 565* 571* 594* 662* 655*   Basic Metabolic Panel: Recent Labs  Lab 03/19/18 0545 03/20/18 0516 03/21/18 0351 03/22/18 0334 03/23/18 0412 03/24/18 0455 03/25/18 0537  NA 139 139 138 139 140 141 140  K 3.4* 4.5 3.8 3.5 3.7 3.6 3.7  CL 109 113* 111 109 109 107 105  CO2 20* 18* 20* 22 21* 23 24  GLUCOSE 145* 135* 114* 110* 106* 112* 122*  BUN 23* 15 13 17 20  23* 18  CREATININE 0.91 0.70 0.80 0.71 0.86 0.93 0.90  CALCIUM 8.2* 8.2* 8.3* 8.2* 8.3* 8.6* 8.4*  MG 1.6* 1.9 1.7 1.5* 2.4  --   --   PHOS 3.1 2.2* 3.2 3.3  --   --   --    GFR: Estimated Creatinine Clearance: 87.5 mL/min (by C-G formula based on SCr of 0.9 mg/dL). Liver Function Tests: Recent Labs  Lab 03/19/18 0545 03/21/18 0351 03/22/18 0334 03/23/18 0412  AST 19 30 28 24   ALT 14 18 17 17   ALKPHOS 85 91 88 82  BILITOT 0.6 0.5 0.3 0.5  PROT 6.8 7.0 6.7 7.1  ALBUMIN 2.8* 3.0* 2.8* 2.9*   No results for input(s): LIPASE, AMYLASE in the last 168 hours. No results for input(s): AMMONIA in the last 168  hours. Coagulation Profile: No results for input(s): INR, PROTIME in the last 168 hours. Cardiac Enzymes: No results for input(s): CKTOTAL, CKMB, CKMBINDEX, TROPONINI in the last 168 hours. BNP (last 3 results) No results for input(s): PROBNP in the last 8760 hours. HbA1C: No results for input(s): HGBA1C in the last 72 hours. CBG: Recent Labs  Lab 03/24/18 2005 03/25/18 0001 03/25/18 0351 03/25/18 0754 03/25/18 1154  GLUCAP 131* 144* 79 93 121*   Lipid Profile: No results for input(s): CHOL, HDL, LDLCALC, TRIG, CHOLHDL, LDLDIRECT in the last 72 hours. Thyroid Function Tests: No results for input(s): TSH, T4TOTAL, FREET4, T3FREE, THYROIDAB in the last 72 hours. Anemia Panel: No results for input(s): VITAMINB12, FOLATE, FERRITIN, TIBC, IRON, RETICCTPCT in the last 72 hours. Sepsis Labs: No results for input(s): PROCALCITON, LATICACIDVEN in the last 168 hours.  Recent Results (from the past 240 hour(s))  Culture, Urine     Status: None   Collection Time: 03/22/18  5:50 PM  Result Value Ref Range Status   Specimen Description   Final    URINE, RANDOM Performed at [facility], [address] [place], [place]    Special Requests   Final    NONE Performed at [facility], [address], [place], [place]    Culture   Final    Multiple bacterial morphotypes present, none predominant. Suggest appropriate recollection if clinically indicated.   Report Status 03/23/2018 FINAL  Final  Culture, Urine     Status: None   Collection Time: 03/24/18  2:27 PM  Result Value Ref Range Status   Specimen Description   Final    URINE, CLEAN CATCH Performed at [facility], [address], [place], [place]    Special Requests   Final    NONE Performed at [facility], [address] [place], [place]    Culture   Final    NO GROWTH Performed at [facility],  [address], [place], [facility] [address]    Report Status 03/25/2018 FINAL  Final         Radiology Studies: No results found.  Scheduled Meds: . acetaminophen  1,000 mg Oral Q6H  . calcium carbonate  1 tablet Oral BID  . cetirizine  10 mg Oral QHS  . clotrimazole  1 application Topical BID  . diclofenac sodium  2 g Topical QID  . docusate sodium  100 mg Oral Daily  . enoxaparin (LOVENOX) injection  40 mg Subcutaneous Q24H  . famotidine  20 mg Oral BID  . feeding supplement (ENSURE ENLIVE)  237 mL Oral TID BM  . fluconazole  100 mg Oral Daily  . folic acid  1 mg Oral Daily  . furosemide  120 mg Oral Daily  . insulin aspart  0-9 Units Subcutaneous Q4H  . isosorbide-hydrALAZINE  2 tablet Oral TID  . lidocaine  1 patch Transdermal Q24H  . metoprolol succinate  50 mg Oral Daily  . mometasone-formoterol  2 puff Inhalation BID  . multivitamin  1 tablet Oral Daily  . pantoprazole  40 mg Oral Daily  . sacubitril-valsartan  1 tablet Oral BID  . sodium chloride flush  3 mL Intravenous Q12H  . sodium chloride flush  5 mL Intracatheter Q8H  . valACYclovir  500 mg Oral BID   Continuous Infusions: . sodium chloride 250 mL (03/24/18 1657)  . levofloxacin (LEVAQUIN) IV 750 mg (03/25/18 1155)  . metronidazole 500 mg (03/25/18 1025)     LOS: 11 days    Time spent: 40 minutes    Ramiro Harvest, MD [facility] Pager [phone]  If 7PM-7AM, please contact night-coverage [email or web address] Password [facility] 03/25/2018, 12:34 PM

## 2018-03-25 NOTE — Progress Notes (Signed)
Medication not available.  Called pharmacy and spoke with [facility], she will send medication up, spoke to RN and advised.

## 2018-03-25 NOTE — Progress Notes (Signed)
     Assessment & Plan: Sigmoid diverticulitis with 5.4 x 4.4 cm pre-rectal abscess/fluid collection, ? UTI vs colovesical fistula -IRplaced TG drain. CX shows WBC with rare gram + cocci in pairs and chains. CX shows STREP VIRIDANS -cont abx therapyas new CT shows persistent changes of diverticulitis. Improvement in collection where drain is, but a new ill-defined collection is present and not able to be drained.  -WBC normal  -no further emesis, tolerating soft diet        Darnell Level, MD       [facility]       Office: [phone]   Chief Complaint: Diverticulitis with abscess  Subjective: Patient in bed, comfortable.  Tolerating diet, no further emesis.  Denies pain.  Objective: Vital signs in last 24 hours: Temp:  [97.9 F (36.6 C)-98.4 F (36.9 C)] 98 F (36.7 C) (12/14 0522) Pulse Rate:  [73-84] 80 (12/14 0522) Resp:  [16-17] 16 (12/14 0522) BP: (103-119)/(60-73) 105/63 (12/14 0522) SpO2:  [98 %-100 %] 98 % (12/14 0522) Weight:  [103.8 kg] 103.8 kg (12/14 0522) Last BM Date: 03/24/18  Intake/Output from previous day: 12/13 0701 - 12/14 0700 In: 2156.5 [P.O.:1315; I.V.:828.3; IV Piggyback:13.3] Out: -  Intake/Output this shift: No intake/output data recorded.  Physical Exam: HEENT - sclerae clear, mucous membranes moist Neck - soft Chest - clear bilaterally Cor - RRR Abdomen - soft without distension; non-tender; drain with minimal output Ext - no edema, non-tender Neuro - alert & oriented, no focal deficits  Lab Results:  Recent Labs    03/24/18 0455 03/25/18 0537  WBC 6.4 5.1  HGB 8.0* 7.4*  HCT 25.7* 24.4*  PLT 662* 655*   BMET Recent Labs    03/24/18 0455 03/25/18 0537  NA 141 140  K 3.6 3.7  CL 107 105  CO2 23 24  GLUCOSE 112* 122*  BUN 23* 18  CREATININE 0.93 0.90  CALCIUM 8.6* 8.4*   PT/INR No results for input(s): LABPROT, INR in the last 72 hours. Comprehensive Metabolic Panel:    Component Value  Date/Time   NA 140 03/25/2018 0537   NA 141 03/24/2018 0455   NA 142 01/06/2017 1433   NA 140 12/20/2016 0938   K 3.7 03/25/2018 0537   K 3.6 03/24/2018 0455   CL 105 03/25/2018 0537   CL 107 03/24/2018 0455   CO2 24 03/25/2018 0537   CO2 23 03/24/2018 0455   BUN 18 03/25/2018 0537   BUN 23 (H) 03/24/2018 0455   BUN 16 01/06/2017 1433   BUN 11 12/20/2016 0938   CREATININE 0.90 03/25/2018 0537   CREATININE 0.93 03/24/2018 0455   GLUCOSE 122 (H) 03/25/2018 0537   GLUCOSE 112 (H) 03/24/2018 0455   CALCIUM 8.4 (L) 03/25/2018 0537   CALCIUM 8.6 (L) 03/24/2018 0455   AST 24 03/23/2018 0412   AST 28 03/22/2018 0334   ALT 17 03/23/2018 0412   ALT 17 03/22/2018 0334   ALKPHOS 82 03/23/2018 0412   ALKPHOS 88 03/22/2018 0334   BILITOT 0.5 03/23/2018 0412   BILITOT 0.3 03/22/2018 0334   PROT 7.1 03/23/2018 0412   PROT 6.7 03/22/2018 0334   ALBUMIN 2.9 (L) 03/23/2018 0412   ALBUMIN 2.8 (L) 03/22/2018 0334    Studies/Results: No results found.    [name] 03/25/2018  Patient ID: [name], female   DOB: [birth year], 47 y.o.   MRN: [id number]

## 2018-03-26 LAB — CBC
HCT: 25.3 % — ABNORMAL LOW (ref 36.0–46.0)
Hemoglobin: 7.7 g/dL — ABNORMAL LOW (ref 12.0–15.0)
MCH: 29.5 pg (ref 26.0–34.0)
MCHC: 30.4 g/dL (ref 30.0–36.0)
MCV: 96.9 fL (ref 80.0–100.0)
Platelets: 588 10*3/uL — ABNORMAL HIGH (ref 150–400)
RBC: 2.61 MIL/uL — ABNORMAL LOW (ref 3.87–5.11)
RDW: 17 % — ABNORMAL HIGH (ref 11.5–15.5)
WBC: 6.6 10*3/uL (ref 4.0–10.5)
nRBC: 0 % (ref 0.0–0.2)

## 2018-03-26 LAB — GLUCOSE, CAPILLARY
Glucose-Capillary: 137 mg/dL — ABNORMAL HIGH (ref 70–99)
Glucose-Capillary: 152 mg/dL — ABNORMAL HIGH (ref 70–99)

## 2018-03-26 NOTE — Progress Notes (Signed)
PROGRESS NOTE    [name]  HYQ:[id number] DOB: 1970/07/12 DOA: 03/14/2018 PCP: Shirlean Mylar, MD    Brief Narrative:  HPI per Dr. Osvaldo Shipper on 03/14/18 [name] a 47 y.o.femalewith a past medical history of chronic systolic CHF with ejection fraction of 40 to 45% based on echocardiogram from October, history of rheumatoid arthritis followed by rheumatology in [facility] on multiple immunosuppressants, history of diabetes mellitus type 2, history of asthma who developed yeast infection in her lower abdomen and in the groin area for which she has been taking antifungal tablet as well as an antifungal cream. This has been ongoing for about 2 weeks. She has had a lot of burning sensation in the lower part at the site of these lesions. She tells me that with treatment the lesions have been improving. And then about 3 days ago she started developing pain in the lower abdomen which is slightly different. She had fever and chills last night although she did not check her temperature. The pain was 8 out of 10 in intensity. She had nausea and vomiting x1. She had a loose stool yesterday. She has hadpoor oral intake.  In the emergency department she underwent a CT scan which revealed sigmoid diverticulitis with fluid collection suspicious for abscess. She was hospitalized for further management. General surgery was consulted. Urine pregnancy test was negative. Last menstrual period was 12th ofNovember.  **She is status post CT drain placement via transgluteal approach on the left side for her pelvic abscess.  Her main complaint now lower abdominal pain. Pain medications are now attempting to be weaned and her Diuretics have been slowly started to be added back and she was placed back on po Lasix. She got a dose of IV Ketorlac and patient states it has helped with her pain so she will get another round today of 15 mg IV q12hprn x2 doses. Surgery and IR recommending repeating a CT  Scan and it its to be done today.   Assessment & Plan:   Principal Problem:   Perforation of sigmoid colon due to diverticulitis Active Problems:   Rheumatoid arthritis (HCC)   Chronic systolic heart failure (HCC)   Abscess of sigmoid colon due to diverticulitis s/p perc drainage 03/15/2018   Diverticulitis of large intestine with abscess without bleeding  Acute Sigmoid Diverticulitis with Abscess status post percutaneous drain placement via transgluteal approach, improving  -CT of the abdomen pelvis with contrast showed sigmoid diverticulitis resulting in secondary inflammation and thickening of the adjacent small bowel loops.  There is a 5.2 x 4.4 cm probable abscess in the perirectal space -General surgery has been consulted and they recommend broad-spectrum IV antibiotic and percutaneous drainage of abscess.  IR was consulted and patient underwent percutaneous drain placement by Dr. Miles Costain on 03/15/18 -General surgery recommending continue IV antibiotic therapy for now. -Patient currently on soft diet per general surgery.  Patient however states when she tries regular food has some abdominal pain and somewhat hesitant. -With some emesis noted 03/22/2018, is some concern from diarrhea or stool from the urine. -Patient was placed on IV Ciprofloxacin and Metronidazole and dose have been changed to IV Levaquin and metronidazole. She is allergic to penicillin.  -Due to concern for yeast infection she was placed on Diflucan as well IV but this has been changed to po  -Her WBC is noted to be elevated from infection and WBC peaked to 24,100 and slightly up from 22,500 on admission; Now was trending down and  currently at 5.1 on 03/25/2018.  -She is afebrile but has slightlyelevated heart rate.No clearsepsis is identified at this time. Otherwise she is hemodynamically stable.  -Blood cultures x2 show NGTD at 5 Days -IR placed drain and aspirated 13 cc of fluid that was sent off for  Analysis; Aspirate Gram stain showed few WBCs both PMNs of mononuclear with rare gram-positive cocci in pairs and chains. Cx grew out Few Viridians Streptococcus. IR recommending continued every shift flushing and monitoring of output -Repeat UA with cultures and sensitivities pending. -Repeat CT abdomen and pelvis with initial abscess smaller and concern for new fluid collection with still some inflammation.  Urine cultures pending.  -Pain control with IV morphine 2 mg to 3 PRN chagned to 1 mg q 4PRN for severe pain along with methocarbamol 500 mg p.o. twice daily PRN for muscle spasm. -Continue empiric IV antibiotics. -Per general surgery continue current treatment of IV antibiotics and will likely need repeat CT abdomen and pelvis in a few days. -General surgery following and appreciate input and recommendations.  Back Pain -Likely in the setting of where her drain is placed -Continue Lidoderm patch and diclofenac -Continue with IV morphine but increased frequency from 2 mg every 4 hours as needed severe pain to every 3 hours as needed yesterday but will scale back to 1 mg q4h PRN and will continue po Tramadol 40 mg po 4 times Daily PRN -Continue with methocarbamol 500 mg p.o. twice daily PRN for muscle spasms -Gave 1x of IV Ketorolac 30 mg daily for the last few days and IV Ketorolac 15 mg q12hprn.  Yeast Infection -She has skin lesions in the lower abdomen and in the groin area.  -This is been treated by her primary care provider with antifungal agents.  -C/w Topical Antifungal Agents with Clotrimazole 1 application topically BID -C/w Fluconazole 100 mg IV q24h and change to po now that diet has been advanced. She is on Day 10/10 of Fluconazole  Chronic Systolic CHF -EF noted to be 40 to 45% based on last echocardiogram done in October.  -She is followed by the heart failure clinic.  -Her elevated BUN and creatinine suggest a degree of hypovolemia.  -She was gently hydrated and  now IVF has stopped. - -Lasix, Entresto, Toprol-XL, Imdur, spironolactone have been resumed.   -I's and O's not properly measured.   -Blood pressure somewhat borderline however patient currently asymptomatic and will monitor for now while on cardiac medication.   -Strict I's and O's.  Daily weights.  -May need to consult Cardiology if there is need for surgical intervention.  Mild Acute Renal Failure, improved  -Likely secondary to prerenal azotemia.  Renal function improved with hydration.   - BUN and creatinine noted to be higher than her usual baseline and was 40/1.21 on Admission.  -Given Gentle IVF hydration for 12 hours and now discontinued. -Resumed Home Entresto, Lasix, spironolactone, Imdur. -Follow. -Monitor blood pressure closely.  Well-controlled diabetes Mellitus Type 2 -Hemoglobin A1c 6.2 on 03/15/2018.   -CBG 137 this morning.   -Patient on soft diet per general surgery.   -Continue to hold oral hypoglycemic agents.  Rheumatoid Arthritis -Patient on multiple immunosuppressants.  -Has not been on steroids in the last 6 months. She takes steroids only as needed for flareups.  -She is noted to be on Methotrexate andHydroxychloroquine.  -She gets Humira injections but has not received them in a few weeks.  -Continue to hold immunosuppressive medications due to acute infection.  -She is followed  by Rheumatology in [facility] and will need outpatient follow up at D/C.  Normocytic Anemia -Anemia panel done and showed an iron level of 10, U IBC of 255, TIBC of 265, saturation ratios of 4%, ferritin level 460, folate level 20.2, and vitamin B12 level 118 -Hemoglobin lower than her baseline on admission. -Hemoglobin currently at 7.7.  -Hold off on IV iron at this time.   -Will likely need oral iron supplementation on discharge.   -Transfusion threshold hemoglobin less than 7.   Morbid Obesity -Estimated body mass index is 44.11 kg/m as calculated from the  following:   Height as of this encounter: 5\' 1"  (1.549 m).   Weight as of this encounter: 105.9 kg.  -Weight loss Counseling given   Hypokalemia -Patient potassium is 3.7 -Replete as needed.   -Follow.    Hypomagnesemia -Magnesium level at 2.4.    Hypophosphatemia -Patient's Phos Level currently at 3.7.  Thrombocytosis -Patient's platelet count was increasing and went up as high as 655 now trending down to 588.  Likely secondary to acute infection.  Continue aspirin.  Follow.    -Abnormal urinalysis ??  For colovesicular fistula.  Urine cultures negative.     DVT prophylaxis: Lovenox Code Status: Full Family Communication: Updated patient.  No family at bedside. Disposition Plan: Likely home when clinically improved and per general surgery.   Consultants:   General surgery: Dr. 03/14/2018  Interventional radiology: Dr. 14/06/2017 03/15/2018  Procedures:  CT abdomen and pelvis 03/14/2018, 03/22/2018  CT abdomen and pelvis 03/22/2018  CT-guided left transgluteal pelvic abscess drain insertion per Dr. 14/02/2018 03/15/2018  Antimicrobials:   Ciprofloxacin 03/15/2018>>>>>> 03/22/2018  IV Levaquin 03/22/2018   Subjective: Patient sitting in chair eating an omelette.  Patient states tolerating diet today without significant abdominal pain.  No nausea or emesis.    Objective: Vitals:   03/25/18 0522 03/25/18 1325 03/25/18 2125 03/26/18 0529  BP: 105/63 (!) 97/57 102/65 111/68  Pulse: 80 84 93 83  Resp: 16 16 17 17   Temp: 98 F (36.7 C) 98 F (36.7 C) 98.5 F (36.9 C) 98.4 F (36.9 C)  TempSrc: Oral Oral Oral Oral  SpO2: 98% 99% 100% 100%  Weight: 103.8 kg   101.5 kg  Height:        Intake/Output Summary (Last 24 hours) at 03/26/2018 1118 Last data filed at 03/26/2018 0601 Gross per 24 hour  Intake 1171.31 ml  Output -  Net 1171.31 ml   Filed Weights   03/22/18 1405 03/25/18 0522 03/26/18 0529  Weight: 103 kg 103.8 kg 101.5 kg     Examination:  General exam: NAD  Respiratory system: CTAB.  No wheezes, no crackles, no rhonchi.  Normal respiratory effort.  Cardiovascular system: RRR no murmurs rubs or gallops.  No JVD.  No lower extremity edema.  Gastrointestinal system: Abdomen is nontender, nondistended, soft, positive bowel sounds.  No rebound.  No guarding.  JP drain intact.  Central nervous system: Alert and oriented. No focal neurological deficits. Extremities: Symmetric 5 x 5 power. Skin: No rashes, lesions or ulcers Psychiatry: Judgement and insight appear fair. Mood & affect appropriate.     Data Reviewed: I have personally reviewed following labs and imaging studies  CBC: Recent Labs  Lab 03/20/18 0516 03/21/18 0351 03/22/18 0334 03/24/18 0455 03/25/18 0537 03/26/18 0552  WBC 14.7* 14.9* 14.3* 6.4 5.1 6.6  NEUTROABS 10.8* 11.1* 10.9* 3.9  --   --   HGB 7.8* 7.5* 7.5* 8.0* 7.4* 7.7*  HCT  26.3* 24.8* 24.7* 25.7* 24.4* 25.3*  MCV 98.1 96.1 95.0 93.5 93.1 96.9  PLT 565* 571* 594* 662* 655* 588*   Basic Metabolic Panel: Recent Labs  Lab 03/20/18 0516 03/21/18 0351 03/22/18 0334 03/23/18 0412 03/24/18 0455 03/25/18 0537  NA 139 138 139 140 141 140  K 4.5 3.8 3.5 3.7 3.6 3.7  CL 113* 111 109 109 107 105  CO2 18* 20* 22 21* 23 24  GLUCOSE 135* 114* 110* 106* 112* 122*  BUN 15 13 17 20  23* 18  CREATININE 0.70 0.80 0.71 0.86 0.93 0.90  CALCIUM 8.2* 8.3* 8.2* 8.3* 8.6* 8.4*  MG 1.9 1.7 1.5* 2.4  --   --   PHOS 2.2* 3.2 3.3  --   --   --    GFR: Estimated Creatinine Clearance: 86.4 mL/min (by C-G formula based on SCr of 0.9 mg/dL). Liver Function Tests: Recent Labs  Lab 03/21/18 0351 03/22/18 0334 03/23/18 0412  AST 30 28 24   ALT 18 17 17   ALKPHOS 91 88 82  BILITOT 0.5 0.3 0.5  PROT 7.0 6.7 7.1  ALBUMIN 3.0* 2.8* 2.9*   No results for input(s): LIPASE, AMYLASE in the last 168 hours. No results for input(s): AMMONIA in the last 168 hours. Coagulation Profile: No results for  input(s): INR, PROTIME in the last 168 hours. Cardiac Enzymes: No results for input(s): CKTOTAL, CKMB, CKMBINDEX, TROPONINI in the last 168 hours. BNP (last 3 results) No results for input(s): PROBNP in the last 8760 hours. HbA1C: No results for input(s): HGBA1C in the last 72 hours. CBG: Recent Labs  Lab 03/25/18 0351 03/25/18 0754 03/25/18 1154 03/25/18 1558 03/26/18 0826  GLUCAP 79 93 121* 112* 137*   Lipid Profile: No results for input(s): CHOL, HDL, LDLCALC, TRIG, CHOLHDL, LDLDIRECT in the last 72 hours. Thyroid Function Tests: No results for input(s): TSH, T4TOTAL, FREET4, T3FREE, THYROIDAB in the last 72 hours. Anemia Panel: No results for input(s): VITAMINB12, FOLATE, FERRITIN, TIBC, IRON, RETICCTPCT in the last 72 hours. Sepsis Labs: No results for input(s): PROCALCITON, LATICACIDVEN in the last 168 hours.  Recent Results (from the past 240 hour(s))  Culture, Urine     Status: None   Collection Time: 03/22/18  5:50 PM  Result Value Ref Range Status   Specimen Description   Final    URINE, RANDOM Performed at [facility], [address], [place], [place] [address]    Special Requests   Final    NONE Performed at [facility], [address] [place], [place] [address]    Culture   Final    Multiple bacterial morphotypes present, none predominant. Suggest appropriate recollection if clinically indicated.   Report Status 03/23/2018 FINAL  Final  Culture, Urine     Status: None   Collection Time: 03/24/18  2:27 PM  Result Value Ref Range Status   Specimen Description   Final    URINE, CLEAN CATCH Performed at [facility], [address], [place], [place] [address]    Special Requests   Final    NONE Performed at [facility], [address] [place], [place] [address]    Culture   Final    NO GROWTH Performed at [facility], [address] [place], [place] [address]     Report Status 03/25/2018 FINAL  Final         Radiology Studies: No results found.      Scheduled Meds: . acetaminophen  1,000 mg Oral Q6H  . aspirin EC  81 mg Oral Daily  . calcium carbonate  1 tablet Oral BID  . cetirizine  10 mg Oral QHS  . clotrimazole  1 application Topical BID  . diclofenac sodium  2 g Topical QID  . docusate sodium  100 mg Oral Daily  . enoxaparin (LOVENOX) injection  40 mg Subcutaneous Q24H  . famotidine  20 mg Oral BID  . feeding supplement (ENSURE ENLIVE)  237 mL Oral TID BM  . fluconazole  100 mg Oral Daily  . folic acid  1 mg Oral Daily  . furosemide  120 mg Oral Daily  . insulin aspart  0-9 Units Subcutaneous Q4H  . isosorbide-hydrALAZINE  2 tablet Oral TID  . lidocaine  1 patch Transdermal Q24H  . metoprolol succinate  50 mg Oral Daily  . mometasone-formoterol  2 puff Inhalation BID  . multivitamin  1 tablet Oral Daily  . pantoprazole  40 mg Oral Daily  . sacubitril-valsartan  1 tablet Oral BID  . sodium chloride flush  3 mL Intravenous Q12H  . sodium chloride flush  5 mL Intracatheter Q8H  . valACYclovir  500 mg Oral BID   Continuous Infusions: . sodium chloride 10 mL/hr at 03/26/18 0601  . levofloxacin (LEVAQUIN) IV Stopped (03/25/18 1258)  . metronidazole 500 mg (03/26/18 1028)     LOS: 12 days    Time spent: 40 minutes    Ramiro Harvest, MD [facility] Pager [phone]  If 7PM-7AM, please contact night-coverage [email or web address] Password [email or web address] 03/26/2018, 11:18 AM

## 2018-03-26 NOTE — Progress Notes (Signed)
Calorie Count Note  48 hour calorie count ordered. Day 1 and Day 2 results below.  Diet: Soft Supplements: Ensure Enlive po TID, each supplement provides 350 kcal and 20 grams of protein  12/13: Breakfast: 60 kcal, 1g protein Lunch: 0% -pt did not order lunch Dinner: ate 100% of a dinner meal that family brought in. Not documented what this was. Supplements: 700 kcal, 40g protein  Total intake: 760 kcal (48% of minimum estimated needs) -underestimated d/t dinner meal 41g protein (60% of minimum estimated needs)  12/14: Breakfast: 380 kcal, 23g protein Lunch: 0% pt did not order lunch Dinner: not documented Supplements: none  Total intake: 380 kcal (24% of minimum estimated needs)  23g protein (33% of minimum estimated needs)  Estimated Nutritional Needs:  Kcal:  5102-5852  Protein:  68-84g/day  Nutrition Dx: Inadequate oral intake related to altered GI function, nausea, vomiting as evidenced by energy intake < or equal to 50% for > or equal to 5 days, other (comment)(calorie count).  Goal: Pt to meet >/= 90% of their estimated nutrition needs   Intervention:  -D/c Calorie Count -Continue Ensure Enlive po BID, each supplement provides 350 kcal and 20 grams of protein  Tilda Franco, MS, RD, LDN [facility] Clinical Dietitian Pager: [phone] After Hours Pager: [phone]

## 2018-03-26 NOTE — Progress Notes (Signed)
Referring Physician(s): Toth,P  Supervising Physician: Simonne Come  Patient Status:  [facility] - In-pt  Chief Complaint: Abdominal/pelvic pain   Subjective: Patient doing okay today, no acute changes; still having  some intermittent deep pelvic discomfort.   Allergies: Clarithromycin; Hydrocodone; Ondansetron; Penicillins; and Tdap [tetanus-diphth-acell pertussis]  Medications: Prior to Admission medications   Medication Sig Start Date End Date Taking? Authorizing Provider  Adalimumab (HUMIRA) 20 MG/0.2ML PSKT Inject into the skin 2 (two) times a week.   Yes [provider]  albuterol (PROAIR HFA) 108 (90 BASE) MCG/ACT inhaler Inhale 2 puffs into the lungs every 6 (six) hours as needed for wheezing or shortness of breath.   Yes [provider]  albuterol (PROVENTIL) (2.5 MG/3ML) 0.083% nebulizer solution Take 2.5 mg by nebulization every 6 (six) hours as needed for wheezing or shortness of breath.   Yes [provider]  clotrimazole (LOTRIMIN) 1 % cream Apply 1 application topically 2 (two) times daily. 03/13/18  Yes [provider]  ferrous gluconate (FERGON) 324 MG tablet Take 324 mg by mouth daily.   Yes [provider]  folic acid (FOLVITE) 1 MG tablet Take 1 mg by mouth daily.   Yes [provider]  furosemide (LASIX) 40 MG tablet Take 3 tablets (120mg ) in the mornings and 2 tablets (80mg ) in the evenings by mouth daily Patient taking differently: Take 120 mg by mouth every morning.  05/20/17  Yes , PA-C  hydroxychloroquine (PLAQUENIL) 200 MG tablet Take 400 mg by mouth daily.    Yes [provider]  isosorbide-hydrALAZINE (BIDIL) 20-37.5 MG tablet Take 2 tablets by mouth 3 (three) times daily. 05/20/17  Yes Graciella Freer, PA-C  levocetirizine (XYZAL) 5 MG tablet Take 5 mg by mouth at bedtime.    Yes [provider]  metFORMIN (GLUCOPHAGE) 500 MG tablet Take 1,000 mg by mouth 2  (two) times daily with a meal.    Yes [provider]  methocarbamol (ROBAXIN) 500 MG tablet Take 500 mg by mouth 2 (two) times daily as needed (for muscle spasms.).    Yes [provider]  Methotrexate, PF, 25 MG/0.5ML SOAJ Inject 0.7 mLs into the skin once a week. On Friday   Yes [provider]  metoprolol succinate (TOPROL-XL) 50 MG 24 hr tablet Take 1.5 tabs daily 01/26/18  Yes Sunday, MD  omeprazole (PRILOSEC) 40 MG capsule Take 40 mg by mouth daily before breakfast.  09/29/16  Yes [provider]  sacubitril-valsartan (ENTRESTO) 97-103 MG Take 1 tablet by mouth 2 (two) times daily. 05/20/17  Yes 10/01/16, PA-C  SYMBICORT 160-4.5 MCG/ACT inhaler Inhale 2 puffs into the lungs daily as needed (for respiratory issues.).  06/01/13  Yes [provider]  traMADol (ULTRAM) 50 MG tablet Take 1 tablet (50 mg total) by mouth 4 (four) times daily. pain Patient taking differently: Take 50 mg by mouth 4 (four) times daily as needed (for pain.).  03/05/12  Yes Rai, Ripudeep K, MD  triamcinolone cream (KENALOG) 0.1 % Apply 1 application topically 2 (two) times daily as needed (for ezcema.).   Yes [provider]  valACYclovir (VALTREX) 500 MG tablet Take 500 mg by mouth 2 (two) times daily as needed (for cold sores.).   Yes [provider]  spironolactone (ALDACTONE) 25 MG tablet Take 1 tablet (25 mg total) by mouth daily. 05/20/17 01/26/18  07/18/17, PA-C     Vital Signs: BP 111/68 (BP Location:  Right Arm)   Pulse 83   Temp 98.4 F (36.9 C) (Oral)   Resp 17   Ht 5\' 1"  (1.549 m)   Wt 223 lb 12.3 oz (101.5 kg)   LMP 03/15/2018 Comment: negative urine pregnancy test 03-14-2018  SpO2 100%   BMI 42.28 kg/m   Physical Exam awake, alert.  Left transgluteal drain intact, small amount fluid in JP bulb.  Imaging: Ct Abdomen Pelvis W Contrast  Result Date: 03/22/2018 CLINICAL DATA:  Suprapubic abdominal  pain. EXAM: CT ABDOMEN AND PELVIS WITH CONTRAST TECHNIQUE: Multidetector CT imaging of the abdomen and pelvis was performed using the standard protocol following bolus administration of intravenous contrast. CONTRAST:  14/02/2018 OMNIPAQUE IOHEXOL 300 MG/ML  SOLN COMPARISON:  CT scan of March 14, 2018. FINDINGS: Lower chest: No acute abnormality. Hepatobiliary: Cholelithiasis is again noted without inflammation. No hepatic lesion is noted. No biliary dilatation is noted. Pancreas: Unremarkable. No pancreatic ductal dilatation or surrounding inflammatory changes. Spleen: Normal in size without focal abnormality. Adrenals/Urinary Tract: Adrenal glands appear normal. Stable left renal cyst is noted. No hydronephrosis or renal obstruction is noted. Urinary bladder is unremarkable. Stomach/Bowel: Stomach and appendix are unremarkable. There is no evidence of bowel obstruction. Stable wall thickening and surrounding inflammatory changes are seen involving the sigmoid colon consistent with diverticulitis. There is been interval placement of 2 left transgluteal drainage catheter which passes through pre rectal fluid collection described on prior CT scan. This fluid collection now measures 4.2 x 2.7 cm in size and is significantly decreased compared to prior exam, although significant residual fluid and gas remains. Another ill-defined fluid collection is noted inferior to the sigmoid colon which measures 4.6 x 2.2 cm. Vascular/Lymphatic: No significant vascular findings are present. No enlarged abdominal or pelvic lymph nodes. Reproductive: Uterus and right adnexal regions are unremarkable. Left adnexal region is not well visualized due to inflammatory changes resulting from previously described sigmoid diverticulitis. Other: No hernia is noted. Musculoskeletal: No acute or significant osseous findings. IMPRESSION: Findings consistent with sigmoid diverticulitis as described on prior exam. Interval placement of drainage  catheter into pre rectal abscess seen on prior exam, which is significantly improved compared to prior exam. However, significant amount of residual fluid and gas remains within the fluid collection. There does appear to be interval development of ill-defined fluid collection inferior to the sigmoid colon which measures 4.6 x 2.2 cm which may represent developing abscess. Attention to on follow-up scans is recommended. Cholelithiasis. Electronically Signed   By: March 16, 2018, M.D.   On: 03/22/2018 15:41    Labs:  CBC: Recent Labs    03/22/18 0334 03/24/18 0455 03/25/18 0537 03/26/18 0552  WBC 14.3* 6.4 5.1 6.6  HGB 7.5* 8.0* 7.4* 7.7*  HCT 24.7* 25.7* 24.4* 25.3*  PLT 594* 662* 655* 588*    COAGS: Recent Labs    03/15/18 0519  INR 0.99    BMP: Recent Labs    03/22/18 0334 03/23/18 0412 03/24/18 0455 03/25/18 0537  NA 139 140 141 140  K 3.5 3.7 3.6 3.7  CL 109 109 107 105  CO2 22 21* 23 24  GLUCOSE 110* 106* 112* 122*  BUN 17 20 23* 18  CALCIUM 8.2* 8.3* 8.6* 8.4*  CREATININE 0.71 0.86 0.93 0.90  GFRNONAA >60 >60 >60 >60  GFRAA >60 >60 >60 >60    LIVER FUNCTION TESTS: Recent Labs    03/19/18 0545 03/21/18 0351 03/22/18 0334 03/23/18 0412  BILITOT 0.6 0.5 0.3 0.5  AST 19 30  28 24  ALT 14 18 17 17   ALKPHOS 85 91 88 82  PROT 6.8 7.0 6.7 7.1  ALBUMIN 2.8* 3.0* 2.8* 2.9*    Assessment and Plan: Pt with hx sigmoid diverticulitis with assoc abscess, s/p drain placement 12/4;afebrile; WBC nl; hgb 7.7(7.4); check follow-up CT early next week   Electronically Signed: D. Jeananne Rama, PA-C 03/26/2018, 12:03 PM   I spent a total of 15 minutes at the the patient's bedside AND on the patient's hospital floor or unit, greater than 50% of which was counseling/coordinating care for pelvic abscess drain    Patient ID: [name], female   DOB: Jul 31, 1970, 47 y.o.   MRN: [id number]

## 2018-03-26 NOTE — Progress Notes (Signed)
     Assessment & Plan: Sigmoid diverticulitis with 5.4 x 4.4 cm pre-rectal abscess/fluid collection  IR drain in place - small output  IV levaquin and flagyl  Tolerating diet  Anticipate repeat CT scan Monday or Tuesday        Darnell Level, MD       [facility]       Office: [phone]   Chief Complaint: Acute diverticulitis with complication  Subjective: Patient complains of persistent suprapubic pain.  Tolerating diet.  Denies pneumaturia or particulate in urine.  Objective: Vital signs in last 24 hours: Temp:  [98 F (36.7 C)-98.5 F (36.9 C)] 98.4 F (36.9 C) (12/15 0529) Pulse Rate:  [83-93] 83 (12/15 0529) Resp:  [16-17] 17 (12/15 0529) BP: (97-111)/(57-68) 111/68 (12/15 0529) SpO2:  [99 %-100 %] 100 % (12/15 0529) Weight:  [101.5 kg] 101.5 kg (12/15 0529) Last BM Date: 03/24/18  Intake/Output from previous day: 12/14 0701 - 12/15 0700 In: 1651.3 [P.O.:1075; I.V.:152.2; IV Piggyback:424.1] Out: -  Intake/Output this shift: No intake/output data recorded.  Physical Exam: HEENT - sclerae clear, mucous membranes moist Neck - soft Chest - clear bilaterally Cor - RRR Abdomen - soft, obese; mild tender suprapubic; drain on left with thin whitish output, small Ext - no edema, non-tender Neuro - alert & oriented, no focal deficits  Lab Results:  Recent Labs    03/25/18 0537 03/26/18 0552  WBC 5.1 6.6  HGB 7.4* 7.7*  HCT 24.4* 25.3*  PLT 655* 588*   BMET Recent Labs    03/24/18 0455 03/25/18 0537  NA 141 140  K 3.6 3.7  CL 107 105  CO2 23 24  GLUCOSE 112* 122*  BUN 23* 18  CREATININE 0.93 0.90  CALCIUM 8.6* 8.4*   PT/INR No results for input(s): LABPROT, INR in the last 72 hours. Comprehensive Metabolic Panel:    Component Value Date/Time   NA 140 03/25/2018 0537   NA 141 03/24/2018 0455   NA 142 01/06/2017 1433   NA 140 12/20/2016 0938   K 3.7 03/25/2018 0537   K 3.6 03/24/2018 0455   CL 105 03/25/2018 0537   CL  107 03/24/2018 0455   CO2 24 03/25/2018 0537   CO2 23 03/24/2018 0455   BUN 18 03/25/2018 0537   BUN 23 (H) 03/24/2018 0455   BUN 16 01/06/2017 1433   BUN 11 12/20/2016 0938   CREATININE 0.90 03/25/2018 0537   CREATININE 0.93 03/24/2018 0455   GLUCOSE 122 (H) 03/25/2018 0537   GLUCOSE 112 (H) 03/24/2018 0455   CALCIUM 8.4 (L) 03/25/2018 0537   CALCIUM 8.6 (L) 03/24/2018 0455   AST 24 03/23/2018 0412   AST 28 03/22/2018 0334   ALT 17 03/23/2018 0412   ALT 17 03/22/2018 0334   ALKPHOS 82 03/23/2018 0412   ALKPHOS 88 03/22/2018 0334   BILITOT 0.5 03/23/2018 0412   BILITOT 0.3 03/22/2018 0334   PROT 7.1 03/23/2018 0412   PROT 6.7 03/22/2018 0334   ALBUMIN 2.9 (L) 03/23/2018 0412   ALBUMIN 2.8 (L) 03/22/2018 0334    Studies/Results: No results found.    [name] 03/26/2018  Patient ID: [name], female   DOB: [birth year], 47 y.o.   MRN: [id number]

## 2018-03-27 ENCOUNTER — Ambulatory Visit: Payer: PPO

## 2018-03-27 ENCOUNTER — Inpatient Hospital Stay (HOSPITAL_COMMUNITY): Payer: PPO

## 2018-03-27 LAB — GLUCOSE, CAPILLARY
GLUCOSE-CAPILLARY: 136 mg/dL — AB (ref 70–99)
GLUCOSE-CAPILLARY: 99 mg/dL (ref 70–99)
Glucose-Capillary: 112 mg/dL — ABNORMAL HIGH (ref 70–99)
Glucose-Capillary: 116 mg/dL — ABNORMAL HIGH (ref 70–99)
Glucose-Capillary: 117 mg/dL — ABNORMAL HIGH (ref 70–99)
Glucose-Capillary: 120 mg/dL — ABNORMAL HIGH (ref 70–99)
Glucose-Capillary: 123 mg/dL — ABNORMAL HIGH (ref 70–99)
Glucose-Capillary: 141 mg/dL — ABNORMAL HIGH (ref 70–99)
Glucose-Capillary: 151 mg/dL — ABNORMAL HIGH (ref 70–99)
Glucose-Capillary: 192 mg/dL — ABNORMAL HIGH (ref 70–99)
Glucose-Capillary: 82 mg/dL (ref 70–99)

## 2018-03-27 LAB — CBC WITH DIFFERENTIAL/PLATELET
Abs Immature Granulocytes: 0.16 10*3/uL — ABNORMAL HIGH (ref 0.00–0.07)
Basophils Absolute: 0.1 10*3/uL (ref 0.0–0.1)
Basophils Relative: 1 %
EOS PCT: 2 %
Eosinophils Absolute: 0.1 10*3/uL (ref 0.0–0.5)
HCT: 26.2 % — ABNORMAL LOW (ref 36.0–46.0)
Hemoglobin: 8 g/dL — ABNORMAL LOW (ref 12.0–15.0)
Immature Granulocytes: 2 %
LYMPHS PCT: 32 %
Lymphs Abs: 2.5 10*3/uL (ref 0.7–4.0)
MCH: 28.7 pg (ref 26.0–34.0)
MCHC: 30.5 g/dL (ref 30.0–36.0)
MCV: 93.9 fL (ref 80.0–100.0)
Monocytes Absolute: 0.9 10*3/uL (ref 0.1–1.0)
Monocytes Relative: 12 %
Neutro Abs: 4.1 10*3/uL (ref 1.7–7.7)
Neutrophils Relative %: 51 %
Platelets: 660 10*3/uL — ABNORMAL HIGH (ref 150–400)
RBC: 2.79 MIL/uL — AB (ref 3.87–5.11)
RDW: 17.1 % — ABNORMAL HIGH (ref 11.5–15.5)
WBC: 7.9 10*3/uL (ref 4.0–10.5)
nRBC: 0 % (ref 0.0–0.2)

## 2018-03-27 LAB — BASIC METABOLIC PANEL
Anion gap: 11 (ref 5–15)
BUN: 25 mg/dL — ABNORMAL HIGH (ref 6–20)
CO2: 27 mmol/L (ref 22–32)
CREATININE: 1 mg/dL (ref 0.44–1.00)
Calcium: 8.5 mg/dL — ABNORMAL LOW (ref 8.9–10.3)
Chloride: 102 mmol/L (ref 98–111)
GFR calc Af Amer: >60 mL/min (ref >60)
GFR calc non Af Amer: >60 mL/min (ref >60)
Glucose, Bld: 117 mg/dL — ABNORMAL HIGH (ref 70–99)
Potassium: 3.6 mmol/L (ref 3.5–5.1)
Sodium: 140 mmol/L (ref 135–145)

## 2018-03-27 MED ORDER — IOHEXOL 300 MG/ML  SOLN
15.0000 mL | Freq: Once | INTRAMUSCULAR | Status: AC | PRN
  Administered 2018-03-27: 15 mL via ORAL
  Filled 2018-03-27: qty 20

## 2018-03-27 MED ORDER — POTASSIUM CHLORIDE CRYS ER 20 MEQ PO TBCR
40.0000 meq | EXTENDED_RELEASE_TABLET | Freq: Once | ORAL | Status: AC
  Administered 2018-03-27: 40 meq via ORAL
  Filled 2018-03-27: qty 2

## 2018-03-27 MED ORDER — SODIUM CHLORIDE (PF) 0.9 % IJ SOLN
INTRAMUSCULAR | Status: AC
  Filled 2018-03-27: qty 50

## 2018-03-27 NOTE — Care Management Important Message (Signed)
Important Message  Patient Details  Name: [name] MRN: [id number] Date of Birth: Apr 04, 1971   Medicare Important Message Given:  Yes    Caren Macadam 03/27/2018, 11:07 AMImportant Message  Patient Details  Name: [name] MRN: [id number] Date of Birth: 1970/12/13   Medicare Important Message Given:  Yes    Caren Macadam 03/27/2018, 11:07 AMImportant Message  Patient Details  Name: [name] MRN: [id number] Date of Birth: [birth year]   Medicare Important Message Given:  Yes    Caren Macadam 03/27/2018, 11:06 AM

## 2018-03-27 NOTE — Progress Notes (Signed)
PROGRESS NOTE    [name]  HYW:[id number] DOB: 11-20-1970 DOA: 03/14/2018 PCP: Shirlean Mylar, MD    Brief Narrative:  HPI per Dr. Osvaldo Shipper on 03/14/18 [name] a 47 y.o.femalewith a past medical history of chronic systolic CHF with ejection fraction of 40 to 45% based on echocardiogram from October, history of rheumatoid arthritis followed by rheumatology in [facility] on multiple immunosuppressants, history of diabetes mellitus type 2, history of asthma who developed yeast infection in her lower abdomen and in the groin area for which she has been taking antifungal tablet as well as an antifungal cream. This has been ongoing for about 2 weeks. She has had a lot of burning sensation in the lower part at the site of these lesions. She tells me that with treatment the lesions have been improving. And then about 3 days ago she started developing pain in the lower abdomen which is slightly different. She had fever and chills last night although she did not check her temperature. The pain was 8 out of 10 in intensity. She had nausea and vomiting x1. She had a loose stool yesterday. She has hadpoor oral intake.  In the emergency department she underwent a CT scan which revealed sigmoid diverticulitis with fluid collection suspicious for abscess. She was hospitalized for further management. General surgery was consulted. Urine pregnancy test was negative. Last menstrual period was 12th ofNovember.  **She is status post CT drain placement via transgluteal approach on the left side for her pelvic abscess.  Her main complaint now lower abdominal pain. Pain medications are now attempting to be weaned and her Diuretics have been slowly started to be added back and she was placed back on po Lasix. She got a dose of IV Ketorlac and patient states it has helped with her pain so she will get another round today of 15 mg IV q12hprn x2 doses. Surgery and IR recommending repeating a CT  Scan and it its to be done today.   Assessment & Plan:   Principal Problem:   Perforation of sigmoid colon due to diverticulitis Active Problems:   Rheumatoid arthritis (HCC)   Chronic systolic heart failure (HCC)   Abscess of sigmoid colon due to diverticulitis s/p perc drainage 03/15/2018   Diverticulitis of large intestine with abscess without bleeding  Acute Sigmoid Diverticulitis with Abscess status post percutaneous drain placement via transgluteal approach, improving  -CT of the abdomen pelvis with contrast showed sigmoid diverticulitis resulting in secondary inflammation and thickening of the adjacent small bowel loops.  There is a 5.2 x 4.4 cm probable abscess in the perirectal space -General surgery has been consulted and they recommend broad-spectrum IV antibiotic and percutaneous drainage of abscess.  IR was consulted and patient underwent percutaneous drain placement by Dr. Miles Costain on 03/15/18 -General surgery recommending continue IV antibiotic therapy for now. -Patient currently on soft diet per general surgery.  Patient however states when she tries regular food has some abdominal pain and somewhat hesitant. -With some emesis noted 03/22/2018, is some concern from diarrhea or stool from the urine. -Patient was placed on IV Ciprofloxacin and Metronidazole and dose have been changed to IV Levaquin and metronidazole. She is allergic to penicillin.  -Due to concern for yeast infection she was placed on Diflucan as well IV but this has been changed to po.  Discontinue fluconazole after today's dose. -Her WBC is noted to be elevated from infection and WBC peaked to 24,100 and slightly up from 22,500 on admission;  Now was trending down and currently at 7.9 on 03/27/2018.  -She is afebrile but has slightlyelevated heart rate.No clearsepsis is identified at this time. Otherwise she is hemodynamically stable.  -Blood cultures x2 show NGTD at 5 Days -IR placed drain and aspirated  13 cc of fluid that was sent off for Analysis; Aspirate Gram stain showed few WBCs both PMNs of mononuclear with rare gram-positive cocci in pairs and chains. Cx grew out Few Viridians Streptococcus. IR recommending continued every shift flushing and monitoring of output -Repeat UA with cultures and sensitivities pending. -Repeat CT abdomen and pelvis with initial abscess smaller and concern for new fluid collection with still some inflammation.  Urine cultures pending.  -Pain control with IV morphine 2 mg to 3 PRN chagned to 1 mg q 4PRN for severe pain along with methocarbamol 500 mg p.o. twice daily PRN for muscle spasm. -Continue empiric IV antibiotics. -Per general surgery continue current treatment of IV antibiotics.  CT abdomen and pelvis has been ordered per general surgery and pending for today 03/27/2018.  -General surgery following and appreciate input and recommendations.  Back Pain -Likely in the setting of where her drain is placed -Continue Lidoderm patch and diclofenac -Continue with IV morphine but increased frequency from 2 mg every 4 hours as needed severe pain to every 3 hours as needed yesterday but will scale back to 1 mg q4h PRN and will continue po Tramadol 40 mg po 4 times Daily PRN -Continue with methocarbamol 500 mg p.o. twice daily PRN for muscle spasms -Gave 1x of IV Ketorolac 30 mg daily for the last few days and IV Ketorolac 15 mg q12hprn.  Yeast Infection -She has skin lesions in the lower abdomen and in the groin area.  -This is been treated by her primary care provider with antifungal agents.  -C/w Topical Antifungal Agents with Clotrimazole 1 application topically BID -C/w Fluconazole 100 mg IV q24h and change to po now that diet has been advanced.   Will discontinue fluconazole after today's dose as patient has had approximately 10 days of antifungal agent.   Chronic Systolic CHF -EF noted to be 40 to 45% based on last echocardiogram done in October.   -She is followed by the heart failure clinic.  -Her elevated BUN and creatinine suggest a degree of hypovolemia.  -She was gently hydrated and now IVF has stopped. - -Lasix, Entresto, Toprol-XL, Imdur, spironolactone have been resumed.   -I's and O's not properly measured.   -Blood pressure somewhat borderline however patient currently asymptomatic and will monitor for now while on cardiac medication.   -Strict I's and O's.  Daily weights.  -May need to consult Cardiology if there is need for surgical intervention.  Mild Acute Renal Failure, improved  -Likely secondary to prerenal azotemia.  Renal function improved with hydration.   - BUN and creatinine noted to be higher than her usual baseline and was 40/1.21 on Admission.  -Given Gentle IVF hydration for 12 hours and now discontinued. -Resumed Home Entresto, Lasix, spironolactone, Imdur. -Follow. -Monitor blood pressure closely.  Well-controlled diabetes Mellitus Type 2 -Hemoglobin A1c 6.2 on 03/15/2018.   -CBG 151 this morning.   -Patient on soft diet per general surgery.   -Continue to hold oral hypoglycemic agents.  Rheumatoid Arthritis -Patient on multiple immunosuppressants.  -Has not been on steroids in the last 6 months. She takes steroids only as needed for flareups.  -She is noted to be on Methotrexate andHydroxychloroquine.  -She gets Humira injections but has  not received them in a few weeks.  -Continue to hold immunosuppressive medications due to acute infection.  -She is followed by Rheumatology in [facility] and will need outpatient follow up at D/C.  Normocytic Anemia -Anemia panel done and showed an iron level of 10, U IBC of 255, TIBC of 265, saturation ratios of 4%, ferritin level 460, folate level 20.2, and vitamin B12 level 118 -Hemoglobin lower than her baseline on admission. -Hemoglobin currently at 8.  -Hold off on IV iron at this time.   -Will likely need oral iron supplementation on  discharge.   -Transfusion threshold hemoglobin less than 7.   Morbid Obesity -Estimated body mass index is 44.11 kg/m as calculated from the following:   Height as of this encounter: 5\' 1"  (1.549 m).   Weight as of this encounter: 105.9 kg.  -Weight loss Counseling given   Hypokalemia -Patient potassium is 3.6 -Replete as needed.   -Follow.    Hypomagnesemia -Magnesium level at 2.4.    Hypophosphatemia -Patient's Phos Level currently at 3.7.  Thrombocytosis -Patient's platelet count was increasing and went up as high as 655 now fluctuating was down to 588 now back up to 660. Continue aspirin.  Follow.    -Abnormal urinalysis ??  For colovesicular fistula.  Urine cultures negative.     DVT prophylaxis: Lovenox Code Status: Full Family Communication: Updated patient.  No family at bedside. Disposition Plan: Likely home when clinically improved and per general surgery.   Consultants:   General surgery: Dr. Johna Sheriff 03/14/2018  Interventional radiology: Dr. Miles Costain 03/15/2018  Procedures:  CT abdomen and pelvis 03/14/2018, 03/22/2018  CT abdomen and pelvis 03/22/2018  CT-guided left transgluteal pelvic abscess drain insertion per Dr. Miles Costain 03/15/2018  CT abdomen and pelvis 03/27/2018 pending  Antimicrobials:   Ciprofloxacin 03/15/2018>>>>>> 03/22/2018  IV Levaquin 03/22/2018   Subjective: Patient sitting up in chair.  States still with some abdominal discomfort with oral intake usually happening in the evenings after she eats at dinner.  Denies any ongoing abdominal pain at this time.  No chest pain.  No shortness of breath.  No nausea or emesis.  Patient was hoping to be able to go home today as today is her birthday.  Objective: Vitals:   03/26/18 2018 03/27/18 0410 03/27/18 0500 03/27/18 0921  BP: 104/66 101/71    Pulse: 82 93    Resp: 17 18    Temp: 98.3 F (36.8 C) 98.1 F (36.7 C)    TempSrc: Oral Oral    SpO2: 100% 100%  98%  Weight:   101.3  kg   Height:        Intake/Output Summary (Last 24 hours) at 03/27/2018 1241 Last data filed at 03/27/2018 0552 Gross per 24 hour  Intake 909.06 ml  Output 35 ml  Net 874.06 ml   Filed Weights   03/25/18 0522 03/26/18 0529 03/27/18 0500  Weight: 103.8 kg 101.5 kg 101.3 kg    Examination:  General exam: NAD  Respiratory system: Lungs are to auscultation bilaterally.  No wheezes, no crackles, no rhonchi.  Cardiovascular system: Regular rate rhythm no murmurs rubs or gallops.  No JVD.  No lower extremity edema. Gastrointestinal system: Abdomen is soft, nontender, nondistended, positive bowel sounds.  No rebound.  No guarding.  JP drain intact with minimal drainage noted.  Central nervous system: Alert and oriented. No focal neurological deficits. Extremities: Symmetric 5 x 5 power. Skin: No rashes, lesions or ulcers Psychiatry: Judgement and insight appear fair. Mood &  affect appropriate.     Data Reviewed: I have personally reviewed following labs and imaging studies  CBC: Recent Labs  Lab 03/21/18 0351 03/22/18 0334 03/24/18 0455 03/25/18 0537 03/26/18 0552 03/27/18 0516  WBC 14.9* 14.3* 6.4 5.1 6.6 7.9  NEUTROABS 11.1* 10.9* 3.9  --   --  4.1  HGB 7.5* 7.5* 8.0* 7.4* 7.7* 8.0*  HCT 24.8* 24.7* 25.7* 24.4* 25.3* 26.2*  MCV 96.1 95.0 93.5 93.1 96.9 93.9  PLT 571* 594* 662* 655* 588* 660*   Basic Metabolic Panel: Recent Labs  Lab 03/21/18 0351 03/22/18 0334 03/23/18 0412 03/24/18 0455 03/25/18 0537 03/27/18 0516  NA 138 139 140 141 140 140  K 3.8 3.5 3.7 3.6 3.7 3.6  CL 111 109 109 107 105 102  CO2 20* 22 21* 23 24 27   GLUCOSE 114* 110* 106* 112* 122* 117*  BUN 13 17 20  23* 18 25*  CREATININE 0.80 0.71 0.86 0.93 0.90 1.00  CALCIUM 8.3* 8.2* 8.3* 8.6* 8.4* 8.5*  MG 1.7 1.5* 2.4  --   --   --   PHOS 3.2 3.3  --   --   --   --    GFR: Estimated Creatinine Clearance: 76.8 mL/min (by C-G formula based on SCr of 1 mg/dL). Liver Function Tests: Recent Labs    Lab 03/21/18 0351 03/22/18 0334 03/23/18 0412  AST 30 28 24   ALT 18 17 17   ALKPHOS 91 88 82  BILITOT 0.5 0.3 0.5  PROT 7.0 6.7 7.1  ALBUMIN 3.0* 2.8* 2.9*   No results for input(s): LIPASE, AMYLASE in the last 168 hours. No results for input(s): AMMONIA in the last 168 hours. Coagulation Profile: No results for input(s): INR, PROTIME in the last 168 hours. Cardiac Enzymes: No results for input(s): CKTOTAL, CKMB, CKMBINDEX, TROPONINI in the last 168 hours. BNP (last 3 results) No results for input(s): PROBNP in the last 8760 hours. HbA1C: No results for input(s): HGBA1C in the last 72 hours. CBG: Recent Labs  Lab 03/26/18 2016 03/27/18 0009 03/27/18 0408 03/27/18 0722 03/27/18 1220  GLUCAP 82 136* 116* 151* 123*   Lipid Profile: No results for input(s): CHOL, HDL, LDLCALC, TRIG, CHOLHDL, LDLDIRECT in the last 72 hours. Thyroid Function Tests: No results for input(s): TSH, T4TOTAL, FREET4, T3FREE, THYROIDAB in the last 72 hours. Anemia Panel: No results for input(s): VITAMINB12, FOLATE, FERRITIN, TIBC, IRON, RETICCTPCT in the last 72 hours. Sepsis Labs: No results for input(s): PROCALCITON, LATICACIDVEN in the last 168 hours.  Recent Results (from the past 240 hour(s))  Culture, Urine     Status: None   Collection Time: 03/22/18  5:50 PM  Result Value Ref Range Status   Specimen Description   Final    URINE, RANDOM Performed at [facility], [address], [place], [place]    Special Requests   Final    NONE Performed at [facility], [address] [place], [place]    Culture   Final    Multiple bacterial morphotypes present, none predominant. Suggest appropriate recollection if clinically indicated.   Report Status 03/23/2018 FINAL  Final  Culture, Urine     Status: None   Collection Time: 03/24/18  2:27 PM  Result Value Ref Range Status   Specimen Description   Final    URINE, CLEAN  CATCH Performed at [facility], [address], [place], [place]    Special Requests   Final  NONE Performed at [facility], [address] [place], [place] [address]    Culture   Final    NO GROWTH Performed at [facility], [address], [place], [place] [address]    Report Status 03/25/2018 FINAL  Final         Radiology Studies: No results found.      Scheduled Meds: . acetaminophen  1,000 mg Oral Q6H  . aspirin EC  81 mg Oral Daily  . calcium carbonate  1 tablet Oral BID  . cetirizine  10 mg Oral QHS  . clotrimazole  1 application Topical BID  . diclofenac sodium  2 g Topical QID  . docusate sodium  100 mg Oral Daily  . enoxaparin (LOVENOX) injection  40 mg Subcutaneous Q24H  . famotidine  20 mg Oral BID  . feeding supplement (ENSURE ENLIVE)  237 mL Oral TID BM  . fluconazole  100 mg Oral Daily  . folic acid  1 mg Oral Daily  . furosemide  120 mg Oral Daily  . insulin aspart  0-9 Units Subcutaneous Q4H  . isosorbide-hydrALAZINE  2 tablet Oral TID  . lidocaine  1 patch Transdermal Q24H  . metoprolol succinate  50 mg Oral Daily  . mometasone-formoterol  2 puff Inhalation BID  . multivitamin  1 tablet Oral Daily  . pantoprazole  40 mg Oral Daily  . sacubitril-valsartan  1 tablet Oral BID  . sodium chloride flush  3 mL Intravenous Q12H  . sodium chloride flush  5 mL Intracatheter Q8H  . valACYclovir  500 mg Oral BID   Continuous Infusions: . sodium chloride 250 mL (03/27/18 1048)  . levofloxacin (LEVAQUIN) IV 750 mg (03/27/18 1219)  . metronidazole 500 mg (03/27/18 1049)     LOS: 13 days    Time spent: 40 minutes    Ramiro Harvest, MD [facility] Pager [phone]  If 7PM-7AM, please contact night-coverage [email or web address] Password [email or web address] 03/27/2018, 12:41 PM

## 2018-03-27 NOTE — Progress Notes (Signed)
Second consult was put in for the patient to get an IV. Pt went down to CT and they were unable to start an IV. IV need for IV abx. Pt's doctor was notified via paging system

## 2018-03-27 NOTE — Progress Notes (Signed)
VAST RN consulted for PIV placement for abx. VAST RN called unit RN to determine plan for pt as she has had numerous IV's this admission. Unit RN stated her IV infiltrated this am during Levaquin infusion. Pt has currently gone down to CT, but might be discharged this afternoon (no dc orders as of yet).  VAST RN contacted pharmacy to see if abx could be given IM, however, it cannot as pH is very low. VAST RN to assess situation further upon pt's return from CT.

## 2018-03-27 NOTE — Progress Notes (Signed)
[facility] Progress Note     Subjective: CC:  Pain overall improved, past 2 days reports dull/aching lower abdominal pain, seems to be worse at night. having loose non-bloody BMs. Urinating without symptoms. Mobilizing. Tolerating PO. Again states this is her first bout of diverticulitis, denies known family history of the disease. Eager for discharge - today is her birthday.  Objective: Vital signs in last 24 hours: Temp:  [98.1 F (36.7 C)-98.4 F (36.9 C)] 98.1 F (36.7 C) (12/16 0410) Pulse Rate:  [82-93] 93 (12/16 0410) Resp:  [17-18] 18 (12/16 0410) BP: (99-104)/(63-71) 101/71 (12/16 0410) SpO2:  [97 %-100 %] 100 % (12/16 0410) Weight:  [101.3 kg] 101.3 kg (12/16 0500) Last BM Date: 03/25/18  Intake/Output from previous day: 12/15 0701 - 12/16 0700 In: 909.1 [P.O.:355; I.V.:75.9; IV Piggyback:468.1] Out: 35 [Drains:35] Intake/Output this shift: No intake/output data recorded.  PE: Gen:  Alert, NAD, pleasant Card:  Regular rate and rhythm, pedal pulses 2+ BL Pulm:  Normal effort, clear to auscultation bilaterally Abd: Soft, obese, non-tender, mild distention, bowel sounds present in all 4 quadrants, LLQ drain clean and dry with small amt purulent drainage Skin: warm and dry, no rashes  Psych: A&Ox3   Lab Results:  Recent Labs    03/26/18 0552 03/27/18 0516  WBC 6.6 7.9  HGB 7.7* 8.0*  HCT 25.3* 26.2*  PLT 588* 660*   BMET Recent Labs    03/25/18 0537 03/27/18 0516  NA 140 140  K 3.7 3.6  CL 105 102  CO2 24 27  GLUCOSE 122* 117*  BUN 18 25*  CREATININE 0.90 1.00  CALCIUM 8.4* 8.5*   PT/INR No results for input(s): LABPROT, INR in the last 72 hours. CMP     Component Value Date/Time   NA 140 03/27/2018 0516   NA 142 01/06/2017 1433   K 3.6 03/27/2018 0516   CL 102 03/27/2018 0516   CO2 27 03/27/2018 0516   GLUCOSE 117 (H) 03/27/2018 0516   BUN 25 (H) 03/27/2018 0516   BUN 16 01/06/2017 1433   CREATININE 1.00 03/27/2018 0516   CALCIUM 8.5 (L) 03/27/2018 0516   PROT 7.1 03/23/2018 0412   ALBUMIN 2.9 (L) 03/23/2018 0412   AST 24 03/23/2018 0412   ALT 17 03/23/2018 0412   ALKPHOS 82 03/23/2018 0412   BILITOT 0.5 03/23/2018 0412   GFRNONAA >60 03/27/2018 0516   GFRAA >60 03/27/2018 0516   Lipase     Component Value Date/Time   LIPASE 30 03/14/2018 1319       Studies/Results: No results found.  Anti-infectives: Anti-infectives (From admission, onward)   Start     Dose/Rate Route Frequency Ordered Stop   03/22/18 1000  levofloxacin (LEVAQUIN) IVPB 750 mg     750 mg 100 mL/hr over 90 Minutes Intravenous Every 24 hours 03/22/18 0846     03/18/18 1000  valACYclovir (VALTREX) tablet 500 mg     500 mg Oral 2 times daily 03/18/18 0806     03/17/18 1830  fluconazole (DIFLUCAN) tablet 100 mg     100 mg Oral Daily 03/17/18 1716     03/15/18 0400  ciprofloxacin (CIPRO) IVPB 400 mg  Status:  Discontinued     400 mg 200 mL/hr over 60 Minutes Intravenous Every 12 hours 03/14/18 1722 03/22/18 0845   03/14/18 1830  metroNIDAZOLE (FLAGYL) IVPB 500 mg     500 mg 100 mL/hr over 60 Minutes Intravenous Every 8 hours 03/14/18 1722     03/14/18  1830  fluconazole (DIFLUCAN) IVPB 100 mg  Status:  Discontinued     100 mg 50 mL/hr over 60 Minutes Intravenous Every 24 hours 03/14/18 1742 03/17/18 1716   03/14/18 1515  ciprofloxacin (CIPRO) IVPB 400 mg     400 mg 200 mL/hr over 60 Minutes Intravenous  Once 03/14/18 1512 03/14/18 1716   03/14/18 1515  metroNIDAZOLE (FLAGYL) IVPB 500 mg  Status:  Discontinued     500 mg 100 mL/hr over 60 Minutes Intravenous  Once 03/14/18 1512 03/14/18 1808     Assessment/Plan Sigmoid diverticulitis with 5.4 x 4.4 cm pre-rectal abscess/fluid collection             IR drain in place - 35 cc/24h             IV levaquin and flagyl, leukocytosis resolved              Tolerating diet  Plan - overall clinically improving. Repeat CT scan today to to further evaluate dull suprapubic pain and  follow up on intra-abd fluid collections. If CT stable/improving then patient may be discharged home on PO abx to complete at 14 day course. She needs to be referred to GI for colonoscopy in 6-8 weeks. She can call our office as needed to discuss surgery, however given that this is her first episode of diverticulitis, it is reasonable to hold off on surgical management for now.   LOS: 13 days    Hosie Spangle, [facility] Pager: [phone]

## 2018-03-28 LAB — GLUCOSE, CAPILLARY
Glucose-Capillary: 127 mg/dL — ABNORMAL HIGH (ref 70–99)
Glucose-Capillary: 127 mg/dL — ABNORMAL HIGH (ref 70–99)
Glucose-Capillary: 157 mg/dL — ABNORMAL HIGH (ref 70–99)
Glucose-Capillary: 99 mg/dL (ref 70–99)

## 2018-03-28 LAB — CBC
HCT: 27 % — ABNORMAL LOW (ref 36.0–46.0)
Hemoglobin: 8.3 g/dL — ABNORMAL LOW (ref 12.0–15.0)
MCH: 29.5 pg (ref 26.0–34.0)
MCHC: 30.7 g/dL (ref 30.0–36.0)
MCV: 96.1 fL (ref 80.0–100.0)
NRBC: 0 % (ref 0.0–0.2)
Platelets: 641 10*3/uL — ABNORMAL HIGH (ref 150–400)
RBC: 2.81 MIL/uL — ABNORMAL LOW (ref 3.87–5.11)
RDW: 17.2 % — ABNORMAL HIGH (ref 11.5–15.5)
WBC: 6.7 10*3/uL (ref 4.0–10.5)

## 2018-03-28 LAB — BASIC METABOLIC PANEL
Anion gap: 12 (ref 5–15)
BUN: 28 mg/dL — ABNORMAL HIGH (ref 6–20)
CO2: 26 mmol/L (ref 22–32)
Calcium: 8.5 mg/dL — ABNORMAL LOW (ref 8.9–10.3)
Chloride: 100 mmol/L (ref 98–111)
Creatinine, Ser: 0.97 mg/dL (ref 0.44–1.00)
GFR calc Af Amer: >60 mL/min (ref >60)
GFR calc non Af Amer: >60 mL/min (ref >60)
Glucose, Bld: 97 mg/dL (ref 70–99)
Potassium: 4 mmol/L (ref 3.5–5.1)
Sodium: 138 mmol/L (ref 135–145)

## 2018-03-28 MED ORDER — METRONIDAZOLE 500 MG PO TABS
500.0000 mg | ORAL_TABLET | Freq: Three times a day (TID) | ORAL | Status: DC
  Administered 2018-03-28: 500 mg via ORAL
  Filled 2018-03-28: qty 1

## 2018-03-28 MED ORDER — LEVOFLOXACIN 750 MG PO TABS: 750.0000 mg | ORAL_TABLET | Freq: Every day | ORAL | Status: DC

## 2018-03-28 MED ORDER — METHOTREXATE (PF) 25 MG/0.5ML ~~LOC~~ SOAJ: 0.7000 mL | SUBCUTANEOUS | Status: DC

## 2018-03-28 MED ORDER — SIMETHICONE 80 MG PO CHEW: 160.0000 mg | CHEWABLE_TABLET | Freq: Four times a day (QID) | ORAL | 0 refills | Status: DC | PRN

## 2018-03-28 MED ORDER — ASPIRIN 81 MG PO TBEC: 81.0000 mg | DELAYED_RELEASE_TABLET | Freq: Every day | ORAL | Status: DC

## 2018-03-28 MED ORDER — HYDROXYCHLOROQUINE SULFATE 200 MG PO TABS: 400.0000 mg | ORAL_TABLET | Freq: Every day | ORAL | Status: DC

## 2018-03-28 MED ORDER — OMEPRAZOLE 40 MG PO CPDR: 40.0000 mg | DELAYED_RELEASE_CAPSULE | Freq: Every day | ORAL | 0 refills | Status: AC

## 2018-03-28 MED ORDER — LEVOFLOXACIN 750 MG PO TABS
750.0000 mg | ORAL_TABLET | Freq: Once | ORAL | Status: AC
  Administered 2018-03-28: 750 mg via ORAL
  Filled 2018-03-28: qty 1

## 2018-03-28 MED ORDER — ADALIMUMAB 20 MG/0.2ML ~~LOC~~ PSKT: 20.0000 mg | PREFILLED_SYRINGE | SUBCUTANEOUS | Status: DC

## 2018-03-28 NOTE — Discharge Summary (Signed)
Physician Discharge Summary  [name] DJS:[id number] DOB: 10-11-1970 DOA: 03/14/2018  PCP: Marilyn Mylar, MD  Admit date: 03/14/2018 Discharge date: 03/28/2018  Time spent: 60 minutes  Recommendations for Outpatient Follow-up:  1. Follow-up with Marilyn Mylar, MD in 2 weeks.  On follow-up patient will need a basic metabolic profile, magnesium level, phosphorus level to follow-up on electrolytes and renal function.  Patient will also need referral to outpatient gastroenterology for probable colonoscopy in 6 to 8 weeks to follow-up on diverticulitis. 2. Follow-up with cardiology as scheduled. 3. Follow-up with [facility] as needed. 4. Follow-up with rheumatologist as scheduled.   Discharge Diagnoses:  Principal Problem:   Perforation of sigmoid colon due to diverticulitis Active Problems:   Rheumatoid arthritis (HCC)   Chronic systolic heart failure (HCC)   Abscess of sigmoid colon due to diverticulitis s/p perc drainage 03/15/2018   Diverticulitis of large intestine with abscess without bleeding   Discharge Condition: Stable and improved  Diet recommendation: Heart healthy  Filed Weights   03/26/18 0529 03/27/18 0500 03/28/18 0500  Weight: 101.5 kg 101.3 kg 101.1 kg    History of present illness:  Per Dr. Rito Ehrlich  : [name] is a 47 y.o. female with a past medical history of chronic systolic CHF with ejection fraction of 40 to 45% based on echocardiogram from October, history of rheumatoid arthritis followed by rheumatology in [facility] on multiple immunosuppressants, history of diabetes mellitus type 2, history of asthma who developed yeast infection in her lower abdomen and in the groin area for which she has been taking antifungal tablet as well as an antifungal cream.  This had been ongoing for about 2 weeks.  She has had a lot of burning sensation in the lower part at the site of these lesions.  She tells me that with treatment the lesions have been  improving.  And then about 3 days ago she started developing pain in the lower abdomen which is slightly different.  She had fever and chills last night although she did not check her temperature.  The pain was 8 out of 10 in intensity.  She had nausea and vomiting x1.  She had a loose stool yesterday.  She has had poor oral intake.  In the emergency department she underwent a CT scan which revealed sigmoid diverticulitis with fluid collection suspicious for abscess.  She was hospitalized for further management.  General surgery was consulted.  Urine pregnancy test was negative.  Last menstrual period was 12th of November.  Hospital Course:  Acute Sigmoid Diverticulitis with Abscess status post percutaneous drain placement via transgluteal approach, improving  -CT of the abdomen pelvis with contrast showed sigmoid diverticulitis resulting in secondary inflammation and thickening of the adjacent small bowel loops. There was a 5.2 x 4.4 cm probable abscess in the perirectal space -General surgery has been consulted and they recommend broad-spectrum IV antibiotic and percutaneous drainage of abscess. IR was consultedand patient underwent percutaneous drain placement by Dr. Miles Costain on 03/15/18 -General surgery followed the patient throughout the hospitalization and patient maintained on IV antibiotics.  Patient improved clinically. -Patient's diet was advanced and patient was on a soft diet which he tolerated. -With some emesis noted 03/22/2018, is some concern from diarrhea or stool from the urine. -Patient was placed on IV Ciprofloxacin and Metronidazole and dose have been changed to IV Levaquin and metronidazole. She is allergic to penicillin.  -Due to concern for yeast infection she was placed on Diflucan as well  IV but this has been changed to po.    Status post 10 days of fluconazole. -Her WBC is noted to be elevated from infection and WBC peaked to 24,100and slightly up from 22,500 on  admission; WBC trended down and leukocytosis had resolved by day of discharge. -Patient remained afebrile.  Noted have some tachycardia.  No clearsepsis is identified at this time. Otherwise she remained hemodynamically stable.  -Blood cultures x2 showed NGTD at 5 Days -IR placed drain and aspirated 13 cc of fluid that was sent off for Analysis; Aspirate Gram stain showed few WBCs both PMNs of mononuclear with rare gram-positive cocci in pairs and chains. Cx grew out Few Viridians Streptococcus. IR recommending continued every shift flushing and monitoring of output -Repeat UA with cultures which came back negative.  -Repeat CT abdomen and pelvis with initial abscess smaller and concern for new fluid collection with still some inflammation.  Urine cultures repeated which were negative.  -Pain control with IV morphine 2 mg to 3 PRN chagned to 1 mg q 4PRN for severe pain along with methocarbamol 500 mg p.o. twice daily PRN for muscle spasm. -Maintain on IV antibiotics during the hospitalization and received a total of 14 days of IV antibiotics.  IV antibiotics were subsequently discontinued on discharge.  -Repeat CT abdomen and pelvis which was done showed improvement with diverticulitis.  Patient was tolerating a solid diet.   -It was felt per general surgery that patient was stable for discharge.  Patient will need to follow-up with gastroenterology for colonoscopy in 6 to 8 weeks.   -Outpatient follow-up with general surgery.   Back Pain -Likely in the setting of where her drain is placed -Was placed on a Lidoderm patch as well as diclofenac.  -Continue with IV morphine but increased frequency from 2 mg every 4 hours as needed severe pain to every 3 hours as needed which was subsequently scaled back to 1 mg q4h PRN and continued on tramadol 40 mg po 4 times Daily PRN -Patient also maintained on methocarbamol 500 mg p.o. twice daily PRN for muscle spasms.  Yeast Infection -She has skin  lesions in the lower abdomen and in the groin area.  -This is been treated by her primary care provider with antifungal agents.  -C/w Topical Antifungal Agents with Clotrimazole 1 application topically BID -C/w Fluconazole 100 mg IV q24h and change to po now that diet has been advanced.    Patient received 10 days of oral antifungal agent and will need no further treatment on discharge.    Chronic Systolic CHF -EF noted to be 40 to 45% based on last echocardiogram done in October.  -She is followed by the heart failure clinic.  -Her elevated BUN and creatinine suggest a degree of hypovolemia.  -She was gently hydrated and now IVF has stopped. - -Lasix, Entresto, Toprol-XL, bidil, spironolactone have been resumed.   -I's and O's not properly measured.   -Blood pressure somewhat borderline however patient currently asymptomatic and remained in stable condition.   -Strict I's and O's.  Daily weights.  -Outpatient follow-up at the heart failure clinic.   Mild Acute Renal Failure, improved  -Likely secondary to prerenal azotemia.  Renal function improved with hydration.   - BUN and creatinine noted to be higher than her usual baseline and was 40/1.21 on Admission.  -Given Gentle IVF hydration for 12 hours and discontinued. -Resumed Home Entresto, Lasix, spironolactone, bidil. -Renal function remained stable.  Outpatient follow-up.   Well-controlled diabetes Mellitus  Type 2 -Hemoglobin A1c 6.2 on 03/15/2018.   -Patient's oral hypoglycemic agents were held throughout the hospitalization and will be resumed on discharge.  Rheumatoid Arthritis -Patient on multiple immunosuppressants.  -Has not been on steroids in the last 6 months. She takes steroids only as needed for flareups.  -She is noted to be on Methotrexate andHydroxychloroquine.  -She gets Humira injections but has not received them in a few weeks.  -Continue to hold immunosuppressive medications due to acute infection.   -She is followed by Rheumatology in [facility] and will need outpatient follow up at D/C. -Patient to resume her rheumatological medications in 1 week.  Normocytic Anemia -Anemia panel done and showed an iron level of 10, U IBC of 255, TIBC of 265, saturation ratios of 4%, ferritin level 460, folate level 20.2, and vitamin B12 level 118 -Hemoglobin lower than her baseline on admission. -Hemoglobin stabilized at 8.3 by day of discharge.  -Patient was discharged on oral iron supplementation.  Outpatient follow-up with PCP.   Morbid Obesity -Estimated body mass index is 44.11 kg/m as calculated from the following: Height as of this encounter: 5\' 1"  (1.549 m). Weight as of this encounter: 105.9 kg. -Weight loss Counseling given   Hypokalemia -Repleted.   Hypomagnesemia -Repleted.  Outpatient follow-up.   Hypophosphatemia -Repleted.  Outpatient follow-up.   Thrombocytosis -Patient's platelet count was increasing and went up as high as 655 now fluctuating was down to 588 now back up to 660.   Patient placed on aspirin 81 mg daily.  Outpatient follow-up.    -Abnormal urinalysis ??  For colovesicular fistula.  Urine cultures negative.  No further work-up needed.    Procedures:  CT abdomen and pelvis 03/14/2018, 03/22/2018  CT abdomen and pelvis 03/22/2018  CT-guided left transgluteal pelvic abscess drain insertion per Dr. 14/02/2018 03/15/2018  CT abdomen and pelvis 03/27/2018  Removal of left transgluteal drain per interventional radiology, 03/29/2018, PA 03/28/2018   Consultations:  General surgery: Dr. 03/30/2018 03/14/2018  Interventional radiology: Dr. 14/06/2017 03/15/2018   Discharge Exam: Vitals:   03/28/18 0522 03/28/18 1318  BP: 101/63 (!) 90/57  Pulse: 76 91  Resp: 16 18  Temp: 98.4 F (36.9 C) 98 F (36.7 C)  SpO2: 98% 98%    General: NAD Cardiovascular: RRR Respiratory: CTAB  Discharge Instructions   Discharge Instructions    Diet -  low sodium heart healthy   Complete by:  As directed    Increase activity slowly   Complete by:  As directed      Allergies as of 03/28/2018      Reactions   Clarithromycin Other (See Comments)   upset stomach   Hydrocodone Nausea Only   Ondansetron Itching   Penicillins Swelling   Has patient had a PCN reaction causing immediate rash, facial/tongue/throat swelling, SOB or lightheadedness with hypotension: Unknown Has patient had a PCN reaction causing severe rash involving mucus membranes or skin necrosis: No Has patient had a PCN reaction that required hospitalization: No Has patient had a PCN reaction occurring within the last 10 years: No If all of the above answers are "NO", then may proceed with Cephalosporin use.   Tdap [tetanus-diphth-acell Pertussis] Other (See Comments)   Swelling at injection site--hot to touch at site      Medication List    TAKE these medications   Adalimumab 20 MG/0.2ML Pskt Commonly known as:  HUMIRA Inject 20 mg into the skin 2 (two) times a week. Start taking on:  April 06, 2018 What changed:    how much to take  These instructions start on April 06, 2018. If you are unsure what to do until then, ask your doctor or other care provider.   aspirin 81 MG EC tablet Take 1 tablet (81 mg total) by mouth daily. Start taking on:  March 29, 2018   clotrimazole 1 % cream Commonly known as:  LOTRIMIN Apply 1 application topically 2 (two) times daily.   ferrous gluconate 324 MG tablet Commonly known as:  FERGON Take 324 mg by mouth daily.   folic acid 1 MG tablet Commonly known as:  FOLVITE Take 1 mg by mouth daily.   furosemide 40 MG tablet Commonly known as:  LASIX Take 3 tablets (120mg ) in the mornings and 2 tablets (80mg ) in the evenings by mouth daily What changed:    how much to take  how to take this  when to take this  additional instructions   hydroxychloroquine 200 MG tablet Commonly known as:  PLAQUENIL Take 2  tablets (400 mg total) by mouth daily. Start taking on:  April 06, 2018 What changed:  These instructions start on April 06, 2018. If you are unsure what to do until then, ask your doctor or other care provider.   isosorbide-hydrALAZINE 20-37.5 MG tablet Commonly known as:  BIDIL Take 2 tablets by mouth 3 (three) times daily.   levocetirizine 5 MG tablet Commonly known as:  XYZAL Take 5 mg by mouth at bedtime.   metFORMIN 500 MG tablet Commonly known as:  GLUCOPHAGE Take 1,000 mg by mouth 2 (two) times daily with a meal.   methocarbamol 500 MG tablet Commonly known as:  ROBAXIN Take 500 mg by mouth 2 (two) times daily as needed (for muscle spasms.).   Methotrexate (PF) 25 MG/0.5ML Soaj Inject 0.7 mLs into the skin once a week. On Friday Start taking on:  April 06, 2018 What changed:  These instructions start on April 06, 2018. If you are unsure what to do until then, ask your doctor or other care provider.   metoprolol succinate 50 MG 24 hr tablet Commonly known as:  TOPROL-XL Take 1.5 tabs daily   omeprazole 40 MG capsule Commonly known as:  PRILOSEC Take 1 capsule (40 mg total) by mouth daily before breakfast.   PROAIR HFA 108 (90 Base) MCG/ACT inhaler Generic drug:  albuterol Inhale 2 puffs into the lungs every 6 (six) hours as needed for wheezing or shortness of breath.   albuterol (2.5 MG/3ML) 0.083% nebulizer solution Commonly known as:  PROVENTIL Take 2.5 mg by nebulization every 6 (six) hours as needed for wheezing or shortness of breath.   sacubitril-valsartan 97-103 MG Commonly known as:  ENTRESTO Take 1 tablet by mouth 2 (two) times daily.   simethicone 80 MG chewable tablet Commonly known as:  MYLICON Chew 2 tablets (160 mg total) by mouth 4 (four) times daily as needed for flatulence.   spironolactone 25 MG tablet Commonly known as:  ALDACTONE Take 1 tablet (25 mg total) by mouth daily.   SYMBICORT 160-4.5 MCG/ACT inhaler Generic drug:   budesonide-formoterol Inhale 2 puffs into the lungs daily as needed (for respiratory issues.).   traMADol 50 MG tablet Commonly known as:  ULTRAM Take 1 tablet (50 mg total) by mouth 4 (four) times daily. pain What changed:    when to take this  reasons to take this  additional instructions   triamcinolone cream 0.1 % Commonly known as:  KENALOG Apply 1 application topically  2 (two) times daily as needed (for ezcema.).   valACYclovir 500 MG tablet Commonly known as:  VALTREX Take 500 mg by mouth 2 (two) times daily as needed (for cold sores.).      Allergies  Allergen Reactions  . Clarithromycin Other (See Comments)    upset stomach  . Hydrocodone Nausea Only  . Ondansetron Itching  . Penicillins Swelling    Has patient had a PCN reaction causing immediate rash, facial/tongue/throat swelling, SOB or lightheadedness with hypotension: Unknown Has patient had a PCN reaction causing severe rash involving mucus membranes or skin necrosis: No Has patient had a PCN reaction that required hospitalization: No Has patient had a PCN reaction occurring within the last 10 years: No If all of the above answers are "NO", then may proceed with Cephalosporin use.   . Tdap [Tetanus-Diphth-Acell Pertussis] Other (See Comments)    Swelling at injection site--hot to touch at site   Follow-up Information    [facility] Call.   Specialty:  General Surgery Why:  call as needed to discuss surgical management of diverticulitis.  Contact information: [address] [place] [address] [phone]       Marilyn Mylar, MD. Schedule an appointment as soon as possible for a visit in 2 week(s).   Specialty:  Family Medicine Contact information: [address] [place] [address] [phone]        Kathleene Hazel, MD .   Specialty:  Cardiology Contact information: [address] [place]  [address] [phone]            The results of significant diagnostics from this hospitalization (including imaging, microbiology, ancillary and laboratory) are listed below for reference.    Significant Diagnostic Studies: Ct Abdomen Pelvis Wo Contrast  Result Date: 03/27/2018 CLINICAL DATA:  Right lower abdominal pain after abscess drainage. EXAM: CT ABDOMEN AND PELVIS WITHOUT CONTRAST TECHNIQUE: Multidetector CT imaging of the abdomen and pelvis was performed following the standard protocol without IV contrast. COMPARISON:  03/22/2018 FINDINGS: Lower chest:  No contributory findings. Hepatobiliary: No focal liver abnormality.Cholelithiasis. No bile duct dilatation Pancreas: Unremarkable. Spleen: Unremarkable. Adrenals/Urinary Tract: Negative adrenals. No hydronephrosis or stone. Unremarkable bladder. Stomach/Bowel: Decreasing inflammation around the sigmoid colon affected by diverticulitis. Stable positioning of pelvic percutaneous catheter via transgluteal approach with no evidence of residual surrounding collection. Low-density below the sigmoid colon is primarily attributed to ovary. No definite undrained collection. Negative for bowel obstruction. Colonic diverticulosis seen from the transverse colon to the sigmoid, most heavily affecting the sigmoid colon. Vascular/Lymphatic: No acute vascular abnormality. Reactive adenopathy attributed to the patient's acute inflammation. Reproductive:Unremarkable. The adnexa are difficult to distinguish from pelvic inflammation. Other: No ascites or pneumoperitoneum. Musculoskeletal: No acute abnormalities. T8-9 and T9-10 small central disc protrusions. IMPRESSION: 1. No visible residual collection around the patient's transgluteal catheter. 2. Recent diverticulitis with improved sigmoid colon inflammation. 3. Colonic diverticulosis. Electronically Signed   By: Marnee Spring M.D.   On: 03/27/2018 16:10   Ct Abdomen Pelvis W Contrast  Result Date:  03/22/2018 CLINICAL DATA:  Suprapubic abdominal pain. EXAM: CT ABDOMEN AND PELVIS WITH CONTRAST TECHNIQUE: Multidetector CT imaging of the abdomen and pelvis was performed using the standard protocol following bolus administration of intravenous contrast. CONTRAST:  OMNIPAQUE IOHEXOL 300 MG/ML  SOLN COMPARISON:  CT scan of March 14, 2018. FINDINGS: Lower chest: No acute abnormality. Hepatobiliary: Cholelithiasis is again noted without inflammation. No hepatic lesion is  noted. No biliary dilatation is noted. Pancreas: Unremarkable. No pancreatic ductal dilatation or surrounding inflammatory changes. Spleen: Normal in size without focal abnormality. Adrenals/Urinary Tract: Adrenal glands appear normal. Stable left renal cyst is noted. No hydronephrosis or renal obstruction is noted. Urinary bladder is unremarkable. Stomach/Bowel: Stomach and appendix are unremarkable. There is no evidence of bowel obstruction. Stable wall thickening and surrounding inflammatory changes are seen involving the sigmoid colon consistent with diverticulitis. There is been interval placement of 2 left transgluteal drainage catheter which passes through pre rectal fluid collection described on prior CT scan. This fluid collection now measures 4.2 x 2.7 cm in size and is significantly decreased compared to prior exam, although significant residual fluid and gas remains. Another ill-defined fluid collection is noted inferior to the sigmoid colon which measures 4.6 x 2.2 cm. Vascular/Lymphatic: No significant vascular findings are present. No enlarged abdominal or pelvic lymph nodes. Reproductive: Uterus and right adnexal regions are unremarkable. Left adnexal region is not well visualized due to inflammatory changes resulting from previously described sigmoid diverticulitis. Other: No hernia is noted. Musculoskeletal: No acute or significant osseous findings. IMPRESSION: Findings consistent with sigmoid diverticulitis as described on  prior exam. Interval placement of drainage catheter into pre rectal abscess seen on prior exam, which is significantly improved compared to prior exam. However, significant amount of residual fluid and gas remains within the fluid collection. There does appear to be interval development of ill-defined fluid collection inferior to the sigmoid colon which measures 4.6 x 2.2 cm which may represent developing abscess. Attention to on follow-up scans is recommended. Cholelithiasis. Electronically Signed   By: Lupita Raider, M.D.   On: 03/22/2018 15:41   Ct Abdomen Pelvis W Contrast  Result Date: 03/14/2018 CLINICAL DATA:  Lower abdominal pain, sepsis. EXAM: CT ABDOMEN AND PELVIS WITH CONTRAST TECHNIQUE: Multidetector CT imaging of the abdomen and pelvis was performed using the standard protocol following bolus administration of intravenous contrast. CONTRAST:  ISOVUE-300 IOPAMIDOL (ISOVUE-300) INJECTION 61% COMPARISON:  None. FINDINGS: Lower chest: No acute abnormality. Hepatobiliary: Cholelithiasis is noted. No biliary dilatation is noted. The liver appears grossly normal. Pancreas: Unremarkable. No pancreatic ductal dilatation or surrounding inflammatory changes. Spleen: Normal in size without focal abnormality. Adrenals/Urinary Tract: Adrenal glands are unremarkable. Kidneys are normal, without renal calculi, focal lesion, or hydronephrosis. Bladder is unremarkable. Stomach/Bowel: Stomach and appendix are unremarkable. Sigmoid diverticulitis is noted. This results in secondary inflammation of adjacent small bowel loops. Also noted is 5.2 x 4.4 cm fluid collection in pre rectal space concerning for abscess. Vascular/Lymphatic: No significant vascular findings are present. No enlarged abdominal or pelvic lymph nodes. Reproductive: Uterus and bilateral adnexa are unremarkable. Other: No hernia is noted. Musculoskeletal: No acute or significant osseous findings. IMPRESSION: Sigmoid diverticulitis is noted  resulting in secondary inflammation and thickening of adjacent small bowel loops. 5.2 x 4.4 cm probable abscess seen in prerectal space. Electronically Signed   By: Lupita Raider, M.D.   On: 03/14/2018 15:33   Ct Image Guided Drainage By Percutaneous Catheter  Result Date: 03/15/2018 INDICATION: Sigmoid diverticulitis, posterior pelvic fluid collection EXAM: CT DRAINAGE PELVIC FLUID COLLECTION MEDICATIONS: The patient is currently admitted to the hospital and receiving intravenous antibiotics. The antibiotics were administered within an appropriate time frame prior to the initiation of the procedure. ANESTHESIA/SEDATION: Fentanyl 100 mcg IV; Versed 3 mg IV Moderate Sedation Time:  16 MINUTES The patient was continuously monitored during the procedure by the interventional radiology nurse under my direct supervision. COMPLICATIONS:  None immediate. PROCEDURE: Informed written consent was obtained from the patient after a thorough discussion of the procedural risks, benefits and alternatives. All questions were addressed. Maximal Sterile Barrier Technique was utilized including caps, mask, sterile gowns, sterile gloves, sterile drape, hand hygiene and skin antiseptic. A timeout was performed prior to the initiation of the procedure. previous imaging reviewed. patient position prone. noncontrast localization ct performed. the posterior pelvic fluid collection was localized. overlying skin marked for a left trans gluteal approach. under sterile conditions and local anesthesia, a 18 gauge 15 cm access was advanced from a left trans gluteal approach to the fluid collection. needle position confirmed with ct. syringe aspiration yielded mildly exudative fluid. sample sent for culture. guidewire inserted followed by tract dilatation to insert a 10 french drain. drain catheter position confirmed with ct. syringe aspiration yielded approximately 15 cc of mildly exudative fluid. this completely collapsed cavity. Catheter  secured with a Prolene suture and connected to external suction bulb. Sterile dressing applied. No immediate complication. Patient tolerated the procedure well. IMPRESSION: Successful CT-guided left trans gluteal pelvic abscess drain insertion. Electronically Signed   By: Judie Petit.  Shick M.D.   On: 03/15/2018 13:22    Microbiology: Recent Results (from the past 240 hour(s))  Culture, Urine     Status: None   Collection Time: 03/22/18  5:50 PM  Result Value Ref Range Status   Specimen Description   Final    URINE, RANDOM Performed at [facility], [address] [place], [place] [address]    Special Requests   Final    NONE Performed at [facility], [address], [place], [place] [address]    Culture   Final    Multiple bacterial morphotypes present, none predominant. Suggest appropriate recollection if clinically indicated.   Report Status 03/23/2018 FINAL  Final  Culture, Urine     Status: None   Collection Time: 03/24/18  2:27 PM  Result Value Ref Range Status   Specimen Description   Final    URINE, CLEAN CATCH Performed at [facility], [address] [place], [place] [address]    Special Requests   Final    NONE Performed at [facility], [address], [place], [place] [address]    Culture   Final    NO GROWTH Performed at [facility], [address], [place], [place] [address]    Report Status 03/25/2018 FINAL  Final     Labs: Basic Metabolic Panel: Recent Labs  Lab 03/22/18 0334 03/23/18 0412 03/24/18 0455 03/25/18 0537 03/27/18 0516 03/28/18 0615  NA 139 140 141 140 140 138  K 3.5 3.7 3.6 3.7 3.6 4.0  CL 109 109 107 105 102 100  CO2 22 21* 23 24 27 26   GLUCOSE 110* 106* 112* 122* 117* 97  BUN 17 20 23* 18 25* 28*  CREATININE 0.71 0.86 0.93 0.90 1.00 0.97  CALCIUM 8.2* 8.3* 8.6* 8.4* 8.5* 8.5*  MG 1.5* 2.4  --   --   --   --   PHOS 3.3  --   --   --   --   --    Liver  Function Tests: Recent Labs  Lab 03/22/18 0334 03/23/18 0412  AST 28 24  ALT 17 17  ALKPHOS 88 82  BILITOT 0.3 0.5  PROT 6.7 7.1  ALBUMIN 2.8* 2.9*   No results for input(s): LIPASE, AMYLASE in the last 168 hours. No results for  input(s): AMMONIA in the last 168 hours. CBC: Recent Labs  Lab 03/22/18 0334 03/24/18 0455 03/25/18 0537 03/26/18 0552 03/27/18 0516 03/28/18 0615  WBC 14.3* 6.4 5.1 6.6 7.9 6.7  NEUTROABS 10.9* 3.9  --   --  4.1  --   HGB 7.5* 8.0* 7.4* 7.7* 8.0* 8.3*  HCT 24.7* 25.7* 24.4* 25.3* 26.2* 27.0*  MCV 95.0 93.5 93.1 96.9 93.9 96.1  PLT 594* 662* 655* 588* 660* 641*   Cardiac Enzymes: No results for input(s): CKTOTAL, CKMB, CKMBINDEX, TROPONINI in the last 168 hours. BNP: BNP (last 3 results) No results for input(s): BNP in the last 8760 hours.  ProBNP (last 3 results) No results for input(s): PROBNP in the last 8760 hours.  CBG: Recent Labs  Lab 03/27/18 2104 03/28/18 0001 03/28/18 0358 03/28/18 0944 03/28/18 1131  GLUCAP 141* 127* 99 127* 157*       Signed:  Ramiro Harvest MD.  [facility] 03/28/2018, 3:21 PM

## 2018-03-28 NOTE — Progress Notes (Signed)
[facility] Progress Note     Subjective: CC: diverticulitis Patient with no abdominal pain. Passing gas and having BMs. Tolerating diet.   Objective: Vital signs in last 24 hours: Temp:  [98.2 F (36.8 C)-98.7 F (37.1 C)] 98.4 F (36.9 C) (12/17 0522) Pulse Rate:  [76-86] 76 (12/17 0522) Resp:  [16-17] 16 (12/17 0522) BP: (82-106)/(43-63) 101/63 (12/17 0522) SpO2:  [96 %-100 %] 98 % (12/17 0522) Weight:  [101.1 kg] 101.1 kg (12/17 0500) Last BM Date: 03/26/18  Intake/Output from previous day: 12/16 0701 - 12/17 0700 In: 944 [P.O.:755; I.V.:5.7; IV Piggyback:178.2] Out: 20 [Drains:20] Intake/Output this shift: No intake/output data recorded.  PE: Gen:  Alert, NAD, pleasant Card:  Regular rate and rhythm, pedal pulses 2+ BL Pulm:  Normal effort, clear to auscultation bilaterally Abd: Soft, non-tender, non-distended, bowel sounds present, TG drain with small amount lacteal fluid Skin: warm and dry, no rashes  Psych: A&Ox3   Lab Results:  Recent Labs    03/27/18 0516 03/28/18 0615  WBC 7.9 6.7  HGB 8.0* 8.3*  HCT 26.2* 27.0*  PLT 660* 641*   BMET Recent Labs    03/27/18 0516 03/28/18 0615  NA 140 138  K 3.6 4.0  CL 102 100  CO2 27 26  GLUCOSE 117* 97  BUN 25* 28*  CREATININE 1.00 0.97  CALCIUM 8.5* 8.5*   PT/INR No results for input(s): LABPROT, INR in the last 72 hours. CMP     Component Value Date/Time   NA 138 03/28/2018 0615   NA 142 01/06/2017 1433   K 4.0 03/28/2018 0615   CL 100 03/28/2018 0615   CO2 26 03/28/2018 0615   GLUCOSE 97 03/28/2018 0615   BUN 28 (H) 03/28/2018 0615   BUN 16 01/06/2017 1433   CREATININE 0.97 03/28/2018 0615   CALCIUM 8.5 (L) 03/28/2018 0615   PROT 7.1 03/23/2018 0412   ALBUMIN 2.9 (L) 03/23/2018 0412   AST 24 03/23/2018 0412   ALT 17 03/23/2018 0412   ALKPHOS 82 03/23/2018 0412   BILITOT 0.5 03/23/2018 0412   GFRNONAA >60 03/28/2018 0615   GFRAA >60 03/28/2018 0615   Lipase     Component  Value Date/Time   LIPASE 30 03/14/2018 1319       Studies/Results: Ct Abdomen Pelvis Wo Contrast  Result Date: 03/27/2018 CLINICAL DATA:  Right lower abdominal pain after abscess drainage. EXAM: CT ABDOMEN AND PELVIS WITHOUT CONTRAST TECHNIQUE: Multidetector CT imaging of the abdomen and pelvis was performed following the standard protocol without IV contrast. COMPARISON:  03/22/2018 FINDINGS: Lower chest:  No contributory findings. Hepatobiliary: No focal liver abnormality.Cholelithiasis. No bile duct dilatation Pancreas: Unremarkable. Spleen: Unremarkable. Adrenals/Urinary Tract: Negative adrenals. No hydronephrosis or stone. Unremarkable bladder. Stomach/Bowel: Decreasing inflammation around the sigmoid colon affected by diverticulitis. Stable positioning of pelvic percutaneous catheter via transgluteal approach with no evidence of residual surrounding collection. Low-density below the sigmoid colon is primarily attributed to ovary. No definite undrained collection. Negative for bowel obstruction. Colonic diverticulosis seen from the transverse colon to the sigmoid, most heavily affecting the sigmoid colon. Vascular/Lymphatic: No acute vascular abnormality. Reactive adenopathy attributed to the patient's acute inflammation. Reproductive:Unremarkable. The adnexa are difficult to distinguish from pelvic inflammation. Other: No ascites or pneumoperitoneum. Musculoskeletal: No acute abnormalities. T8-9 and T9-10 small central disc protrusions. IMPRESSION: 1. No visible residual collection around the patient's transgluteal catheter. 2. Recent diverticulitis with improved sigmoid colon inflammation. 3. Colonic diverticulosis. Electronically Signed   By: Kathrynn Ducking.D.  On: 03/27/2018 16:10    Anti-infectives: Anti-infectives (From admission, onward)   Start     Dose/Rate Route Frequency Ordered Stop   03/28/18 0200  levofloxacin (LEVAQUIN) tablet 750 mg     750 mg Oral  Once 03/28/18 0155  03/28/18 0208   03/22/18 1000  levofloxacin (LEVAQUIN) IVPB 750 mg     750 mg 100 mL/hr over 90 Minutes Intravenous Every 24 hours 03/22/18 0846     03/18/18 1000  valACYclovir (VALTREX) tablet 500 mg     500 mg Oral 2 times daily 03/18/18 0806     03/17/18 1830  fluconazole (DIFLUCAN) tablet 100 mg     100 mg Oral Daily 03/17/18 1716     03/15/18 0400  ciprofloxacin (CIPRO) IVPB 400 mg  Status:  Discontinued     400 mg 200 mL/hr over 60 Minutes Intravenous Every 12 hours 03/14/18 1722 03/22/18 0845   03/14/18 1830  metroNIDAZOLE (FLAGYL) IVPB 500 mg     500 mg 100 mL/hr over 60 Minutes Intravenous Every 8 hours 03/14/18 1722     03/14/18 1830  fluconazole (DIFLUCAN) IVPB 100 mg  Status:  Discontinued     100 mg 50 mL/hr over 60 Minutes Intravenous Every 24 hours 03/14/18 1742 03/17/18 1716   03/14/18 1515  ciprofloxacin (CIPRO) IVPB 400 mg     400 mg 200 mL/hr over 60 Minutes Intravenous  Once 03/14/18 1512 03/14/18 1716   03/14/18 1515  metroNIDAZOLE (FLAGYL) IVPB 500 mg  Status:  Discontinued     500 mg 100 mL/hr over 60 Minutes Intravenous  Once 03/14/18 1512 03/14/18 1808       Assessment/Plan Sigmoid diverticulitis with 5.4 x 4.4 cm pre-rectal abscess/fluid collection IR drain in place - 20 cc/24h, IR to eval for possible removal  Leukocytosis resolved and patient without pain  CT scan yesterday with improvement in diverticulitis Tolerating diet  Plan - overall clinically improving. Recommend patient be discharged home on PO abx to complete at 14 day course. She needs to be referred to GI for colonoscopy in 6-8 weeks. She can call our office as needed to discuss surgery, however given that this is her first episode of diverticulitis, it is reasonable to hold off on surgical management for now.  LOS: 14 days    Wells Guiles , [facility] 03/28/2018, 8:27 AM Pager: [phone] Consults: [phone] Mon-Fri 7:00  am-4:30 pm Sat-Sun 7:00 am-11:30 am

## 2018-03-28 NOTE — Progress Notes (Signed)
Pt has been given discharge paper work and instructions. Pt had a question about the after care post drain removal. Pt was able to verbalize that she was told to leave the dressing on site for 24 hours and to not soak in tub for a few days. Pt is waiting on her sister who gets off work at 5:30p. Dr. Johney Frame was paged and a call back number was left. I did not receive a call back. Will notify patient.  Jilda Roche, RN

## 2018-03-28 NOTE — Progress Notes (Addendum)
PHARMACIST - PHYSICIAN COMMUNICATION DR:   Janee Morn CONCERNING: Antibiotic IV to Oral Route Change Policy  RECOMMENDATION: This patient is receiving metronidazole and levofloxacin by the intravenous route.  Based on criteria approved by the Pharmacy and Therapeutics Committee, the antibiotic(s) is/are being converted to the equivalent oral dose form(s).   DESCRIPTION: These criteria include:  Patient being treated for a respiratory tract infection, urinary tract infection, cellulitis or clostridium difficile associated diarrhea if on metronidazole  The patient is not neutropenic and does not exhibit a GI malabsorption state  The patient is eating (either orally or via tube) and/or has been taking other orally administered medications for a least 24 hours  The patient is improving clinically and has a Tmax < 100.5  Note - Patient has lost IV access so change to PO metronidazole  If you have questions about this conversion, please contact the Pharmacy Department  []   [phone] )  ( [phone] []   [phone] )  [facility] []   [phone] )  [facility] []   [phone] )  [facility] [x]   [phone] )    ( [phone], PharmD, BCPS.   Work Cell: [phone] 03/28/2018 9:00 AM

## 2018-03-28 NOTE — Progress Notes (Signed)
Referring Physician(s): Toth,P  Supervising Physician: Malachy Moan  Patient Status:  [facility] - In-pt  Chief Complaint:  Abdominal/pelvic pain/pelvic abscess  Subjective: Patient sitting up in chair.  No new complaints.  Having BMs and passing flatus.   Allergies: Clarithromycin; Hydrocodone; Ondansetron; Penicillins; and Tdap [tetanus-diphth-acell pertussis]  Medications: Prior to Admission medications   Medication Sig Start Date End Date Taking? Authorizing Provider  Adalimumab (HUMIRA) 20 MG/0.2ML PSKT Inject into the skin 2 (two) times a week.   Yes [provider]  albuterol (PROAIR HFA) 108 (90 BASE) MCG/ACT inhaler Inhale 2 puffs into the lungs every 6 (six) hours as needed for wheezing or shortness of breath.   Yes [provider]  albuterol (PROVENTIL) (2.5 MG/3ML) 0.083% nebulizer solution Take 2.5 mg by nebulization every 6 (six) hours as needed for wheezing or shortness of breath.   Yes [provider]  clotrimazole (LOTRIMIN) 1 % cream Apply 1 application topically 2 (two) times daily. 03/13/18  Yes [provider]  ferrous gluconate (FERGON) 324 MG tablet Take 324 mg by mouth daily.   Yes [provider]  folic acid (FOLVITE) 1 MG tablet Take 1 mg by mouth daily.   Yes [provider]  furosemide (LASIX) 40 MG tablet Take 3 tablets (120mg ) in the mornings and 2 tablets (80mg ) in the evenings by mouth daily Patient taking differently: Take 120 mg by mouth every morning.  05/20/17  Yes Graciella Freer, PA-C  hydroxychloroquine (PLAQUENIL) 200 MG tablet Take 400 mg by mouth daily.    Yes [provider]  isosorbide-hydrALAZINE (BIDIL) 20-37.5 MG tablet Take 2 tablets by mouth 3 (three) times daily. 05/20/17  Yes Graciella Freer, PA-C  levocetirizine (XYZAL) 5 MG tablet Take 5 mg by mouth at bedtime.    Yes [provider]  metFORMIN (GLUCOPHAGE) 500 MG tablet Take 1,000 mg by mouth 2  (two) times daily with a meal.    Yes [provider]  methocarbamol (ROBAXIN) 500 MG tablet Take 500 mg by mouth 2 (two) times daily as needed (for muscle spasms.).    Yes [provider]  Methotrexate, PF, 25 MG/0.5ML SOAJ Inject 0.7 mLs into the skin once a week. On Friday   Yes [provider]  metoprolol succinate (TOPROL-XL) 50 MG 24 hr tablet Take 1.5 tabs daily 01/26/18  Yes Laurey Morale, MD  omeprazole (PRILOSEC) 40 MG capsule Take 40 mg by mouth daily before breakfast.  09/29/16  Yes [provider]  sacubitril-valsartan (ENTRESTO) 97-103 MG Take 1 tablet by mouth 2 (two) times daily. 05/20/17  Yes Graciella Freer, PA-C  SYMBICORT 160-4.5 MCG/ACT inhaler Inhale 2 puffs into the lungs daily as needed (for respiratory issues.).  06/01/13  Yes [provider]  traMADol (ULTRAM) 50 MG tablet Take 1 tablet (50 mg total) by mouth 4 (four) times daily. pain Patient taking differently: Take 50 mg by mouth 4 (four) times daily as needed (for pain.).  03/05/12  Yes Rai, Ripudeep K, MD  triamcinolone cream (KENALOG) 0.1 % Apply 1 application topically 2 (two) times daily as needed (for ezcema.).   Yes [provider]  valACYclovir (VALTREX) 500 MG tablet Take 500 mg by mouth 2 (two) times daily as needed (for cold sores.).   Yes [provider]  spironolactone (ALDACTONE) 25 MG tablet Take 1 tablet (25 mg total) by mouth daily. 05/20/17 01/26/18  Graciella Freer, PA-C     Vital Signs: BP 101/63 (BP  Location: Right Arm)   Pulse 76   Temp 98.4 F (36.9 C) (Oral)   Resp 16   Ht 5\' 1"  (1.549 m)   Wt 222 lb 14.2 oz (101.1 kg)   LMP 03/15/2018 Comment: negative urine pregnancy test 03-14-2018  SpO2 98%   BMI 42.11 kg/m   Physical Exam awake, alert.  Left transgluteal drain intact, insertion site okay, mildly tender, small amount of turbid beige-colored fluid in JP bulb  Imaging: Ct Abdomen Pelvis Wo Contrast  Result  Date: 03/27/2018 CLINICAL DATA:  Right lower abdominal pain after abscess drainage. EXAM: CT ABDOMEN AND PELVIS WITHOUT CONTRAST TECHNIQUE: Multidetector CT imaging of the abdomen and pelvis was performed following the standard protocol without IV contrast. COMPARISON:  03/22/2018 FINDINGS: Lower chest:  No contributory findings. Hepatobiliary: No focal liver abnormality.Cholelithiasis. No bile duct dilatation Pancreas: Unremarkable. Spleen: Unremarkable. Adrenals/Urinary Tract: Negative adrenals. No hydronephrosis or stone. Unremarkable bladder. Stomach/Bowel: Decreasing inflammation around the sigmoid colon affected by diverticulitis. Stable positioning of pelvic percutaneous catheter via transgluteal approach with no evidence of residual surrounding collection. Low-density below the sigmoid colon is primarily attributed to ovary. No definite undrained collection. Negative for bowel obstruction. Colonic diverticulosis seen from the transverse colon to the sigmoid, most heavily affecting the sigmoid colon. Vascular/Lymphatic: No acute vascular abnormality. Reactive adenopathy attributed to the patient's acute inflammation. Reproductive:Unremarkable. The adnexa are difficult to distinguish from pelvic inflammation. Other: No ascites or pneumoperitoneum. Musculoskeletal: No acute abnormalities. T8-9 and T9-10 small central disc protrusions. IMPRESSION: 1. No visible residual collection around the patient's transgluteal catheter. 2. Recent diverticulitis with improved sigmoid colon inflammation. 3. Colonic diverticulosis. Electronically Signed   By: 14/02/2018 M.D.   On: 03/27/2018 16:10    Labs:  CBC: Recent Labs    03/25/18 0537 03/26/18 0552 03/27/18 0516 03/28/18 0615  WBC 5.1 6.6 7.9 6.7  HGB 7.4* 7.7* 8.0* 8.3*  HCT 24.4* 25.3* 26.2* 27.0*  PLT 655* 588* 660* 641*    COAGS: Recent Labs    03/15/18 0519  INR 0.99    BMP: Recent Labs    03/24/18 0455 03/25/18 0537 03/27/18 0516  03/28/18 0615  NA 141 140 140 138  K 3.6 3.7 3.6 4.0  CL 107 105 102 100  CO2 23 24 27 26   GLUCOSE 112* 122* 117* 97  BUN 23* 18 25* 28*  CALCIUM 8.6* 8.4* 8.5* 8.5*  CREATININE 0.93 0.90 1.00 0.97  GFRNONAA >60 >60 >60 >60  GFRAA >60 >60 >60 >60    LIVER FUNCTION TESTS: Recent Labs    03/19/18 0545 03/21/18 0351 03/22/18 0334 03/23/18 0412  BILITOT 0.6 0.5 0.3 0.5  AST 19 30 28 24   ALT 14 18 17 17   ALKPHOS 85 91 88 82  PROT 6.8 7.0 6.7 7.1  ALBUMIN 2.8* 3.0* 2.8* 2.9*    Assessment and Plan: Pt with hx sigmoid diverticulitis with assoc abscess, s/p drain placement 12/4;afebrile; WBC normal, hemoglobin stable at 8.3, creatinine normal; follow-up CT abdomen pelvis yesterday revealed no visible residual collection around patient's transgluteal drain, improved sigmoid colon inflammation.  Latest imaging studies were reviewed by Dr. 14/11/19.  Case discussed with [facility].  Decision made to remove left transgluteal drain.  Drain removed in its entirety without immediate complications and gauze dressing applied over site.  Further plans as per CCS.   Electronically Signed: D. 14/12/19, PA-C 03/28/2018, 10:36 AM   I spent a total of 20 minutes at the the patient's bedside AND on the patient's hospital  floor or unit, greater than 50% of which was counseling/coordinating care for pelvic abscess drain    Patient ID: [name], female   DOB: 09/06/1970, 47 y.o.   MRN: [id number]

## 2018-03-31 DIAGNOSIS — Z5181 Encounter for therapeutic drug level monitoring: Secondary | ICD-10-CM | POA: Diagnosis not present

## 2018-03-31 DIAGNOSIS — Z09 Encounter for follow-up examination after completed treatment for conditions other than malignant neoplasm: Secondary | ICD-10-CM | POA: Diagnosis not present

## 2018-04-07 DIAGNOSIS — Z09 Encounter for follow-up examination after completed treatment for conditions other than malignant neoplasm: Secondary | ICD-10-CM | POA: Diagnosis not present

## 2018-04-10 DIAGNOSIS — R197 Diarrhea, unspecified: Secondary | ICD-10-CM | POA: Diagnosis not present

## 2018-04-10 DIAGNOSIS — R399 Unspecified symptoms and signs involving the genitourinary system: Secondary | ICD-10-CM | POA: Diagnosis not present

## 2018-04-10 DIAGNOSIS — R112 Nausea with vomiting, unspecified: Secondary | ICD-10-CM | POA: Diagnosis not present

## 2018-04-14 DIAGNOSIS — Z79899 Other long term (current) drug therapy: Secondary | ICD-10-CM | POA: Diagnosis not present

## 2018-04-14 DIAGNOSIS — M0600 Rheumatoid arthritis without rheumatoid factor, unspecified site: Secondary | ICD-10-CM | POA: Diagnosis not present

## 2018-04-19 ENCOUNTER — Other Ambulatory Visit: Payer: Self-pay | Admitting: Family Medicine

## 2018-04-19 DIAGNOSIS — D649 Anemia, unspecified: Secondary | ICD-10-CM | POA: Diagnosis not present

## 2018-04-19 DIAGNOSIS — Z6841 Body Mass Index (BMI) 40.0 and over, adult: Secondary | ICD-10-CM | POA: Diagnosis not present

## 2018-04-19 DIAGNOSIS — K859 Acute pancreatitis without necrosis or infection, unspecified: Secondary | ICD-10-CM

## 2018-04-19 DIAGNOSIS — K5720 Diverticulitis of large intestine with perforation and abscess without bleeding: Secondary | ICD-10-CM | POA: Diagnosis not present

## 2018-04-19 DIAGNOSIS — K219 Gastro-esophageal reflux disease without esophagitis: Secondary | ICD-10-CM | POA: Diagnosis not present

## 2018-04-20 ENCOUNTER — Other Ambulatory Visit: Payer: PPO

## 2018-05-03 ENCOUNTER — Ambulatory Visit: Payer: PPO

## 2018-05-15 ENCOUNTER — Encounter: Payer: Self-pay | Admitting: Radiology

## 2018-05-15 ENCOUNTER — Ambulatory Visit
Admission: RE | Admit: 2018-05-15 | Discharge: 2018-05-15 | Disposition: A | Discharge status: Home / Self Care | Payer: PPO | Source: Ambulatory Visit | Attending: Family Medicine | Admitting: Family Medicine

## 2018-05-15 DIAGNOSIS — K859 Acute pancreatitis without necrosis or infection, unspecified: Secondary | ICD-10-CM | POA: Diagnosis not present

## 2018-05-15 MED ORDER — IOPAMIDOL (ISOVUE-300) INJECTION 61%
100.0000 mL | Freq: Once | INTRAVENOUS | Status: AC | PRN
  Administered 2018-05-15: 100 mL via INTRAVENOUS

## 2018-05-24 DIAGNOSIS — D509 Iron deficiency anemia, unspecified: Secondary | ICD-10-CM | POA: Diagnosis not present

## 2018-05-24 DIAGNOSIS — K5730 Diverticulosis of large intestine without perforation or abscess without bleeding: Secondary | ICD-10-CM | POA: Diagnosis not present

## 2018-05-24 DIAGNOSIS — K317 Polyp of stomach and duodenum: Secondary | ICD-10-CM | POA: Diagnosis not present

## 2018-05-24 DIAGNOSIS — K635 Polyp of colon: Secondary | ICD-10-CM | POA: Diagnosis not present

## 2018-05-24 DIAGNOSIS — K2930 Chronic superficial gastritis without bleeding: Secondary | ICD-10-CM | POA: Diagnosis not present

## 2018-05-24 DIAGNOSIS — K5732 Diverticulitis of large intestine without perforation or abscess without bleeding: Secondary | ICD-10-CM | POA: Diagnosis not present

## 2018-05-24 DIAGNOSIS — K219 Gastro-esophageal reflux disease without esophagitis: Secondary | ICD-10-CM | POA: Diagnosis not present

## 2018-05-30 ENCOUNTER — Ambulatory Visit: Payer: PPO

## 2018-05-30 ENCOUNTER — Encounter (HOSPITAL_COMMUNITY): Payer: PPO | Admitting: Cardiology

## 2018-05-30 DIAGNOSIS — K635 Polyp of colon: Secondary | ICD-10-CM | POA: Diagnosis not present

## 2018-05-30 DIAGNOSIS — K2930 Chronic superficial gastritis without bleeding: Secondary | ICD-10-CM | POA: Diagnosis not present

## 2018-06-16 DIAGNOSIS — Z79899 Other long term (current) drug therapy: Secondary | ICD-10-CM | POA: Diagnosis not present

## 2018-06-16 DIAGNOSIS — M0600 Rheumatoid arthritis without rheumatoid factor, unspecified site: Secondary | ICD-10-CM | POA: Diagnosis not present

## 2018-06-20 ENCOUNTER — Other Ambulatory Visit: Payer: Self-pay | Admitting: Obstetrics and Gynecology

## 2018-06-20 DIAGNOSIS — N888 Other specified noninflammatory disorders of cervix uteri: Secondary | ICD-10-CM | POA: Diagnosis not present

## 2018-06-20 DIAGNOSIS — N870 Mild cervical dysplasia: Secondary | ICD-10-CM | POA: Diagnosis not present

## 2018-06-20 DIAGNOSIS — Z3202 Encounter for pregnancy test, result negative: Secondary | ICD-10-CM | POA: Diagnosis not present

## 2018-06-23 DIAGNOSIS — M069 Rheumatoid arthritis, unspecified: Secondary | ICD-10-CM | POA: Diagnosis not present

## 2018-06-28 ENCOUNTER — Other Ambulatory Visit (HOSPITAL_COMMUNITY): Payer: Self-pay | Admitting: Student

## 2018-06-29 ENCOUNTER — Other Ambulatory Visit (HOSPITAL_COMMUNITY): Payer: Self-pay | Admitting: Student

## 2018-07-20 ENCOUNTER — Encounter (HOSPITAL_COMMUNITY): Payer: PPO | Admitting: Cardiology

## 2018-08-26 ENCOUNTER — Other Ambulatory Visit (HOSPITAL_COMMUNITY): Payer: Self-pay | Admitting: Student

## 2018-09-12 DIAGNOSIS — L810 Postinflammatory hyperpigmentation: Secondary | ICD-10-CM | POA: Diagnosis not present

## 2018-09-12 DIAGNOSIS — L658 Other specified nonscarring hair loss: Secondary | ICD-10-CM | POA: Diagnosis not present

## 2018-09-12 DIAGNOSIS — L218 Other seborrheic dermatitis: Secondary | ICD-10-CM | POA: Diagnosis not present

## 2018-09-15 ENCOUNTER — Other Ambulatory Visit (HOSPITAL_COMMUNITY): Payer: Self-pay | Admitting: Student

## 2018-09-18 DIAGNOSIS — Z79899 Other long term (current) drug therapy: Secondary | ICD-10-CM | POA: Diagnosis not present

## 2018-09-18 DIAGNOSIS — M0600 Rheumatoid arthritis without rheumatoid factor, unspecified site: Secondary | ICD-10-CM | POA: Diagnosis not present

## 2018-10-06 DIAGNOSIS — I10 Essential (primary) hypertension: Secondary | ICD-10-CM | POA: Diagnosis not present

## 2018-10-06 DIAGNOSIS — N870 Mild cervical dysplasia: Secondary | ICD-10-CM | POA: Diagnosis not present

## 2018-10-06 DIAGNOSIS — M329 Systemic lupus erythematosus, unspecified: Secondary | ICD-10-CM | POA: Diagnosis not present

## 2018-10-06 DIAGNOSIS — I5022 Chronic systolic (congestive) heart failure: Secondary | ICD-10-CM | POA: Diagnosis not present

## 2018-10-06 DIAGNOSIS — J45909 Unspecified asthma, uncomplicated: Secondary | ICD-10-CM | POA: Diagnosis not present

## 2018-10-06 DIAGNOSIS — Z0000 Encounter for general adult medical examination without abnormal findings: Secondary | ICD-10-CM | POA: Diagnosis not present

## 2018-10-06 DIAGNOSIS — E119 Type 2 diabetes mellitus without complications: Secondary | ICD-10-CM | POA: Diagnosis not present

## 2018-10-06 DIAGNOSIS — M069 Rheumatoid arthritis, unspecified: Secondary | ICD-10-CM | POA: Diagnosis not present

## 2018-10-24 DIAGNOSIS — L658 Other specified nonscarring hair loss: Secondary | ICD-10-CM | POA: Diagnosis not present

## 2018-10-27 DIAGNOSIS — Z79899 Other long term (current) drug therapy: Secondary | ICD-10-CM | POA: Diagnosis not present

## 2018-10-27 DIAGNOSIS — M0600 Rheumatoid arthritis without rheumatoid factor, unspecified site: Secondary | ICD-10-CM | POA: Diagnosis not present

## 2018-10-27 DIAGNOSIS — M5431 Sciatica, right side: Secondary | ICD-10-CM | POA: Diagnosis not present

## 2018-11-21 DIAGNOSIS — L668 Other cicatricial alopecia: Secondary | ICD-10-CM | POA: Diagnosis not present

## 2018-12-10 ENCOUNTER — Other Ambulatory Visit (HOSPITAL_COMMUNITY): Payer: Self-pay | Admitting: Cardiology

## 2018-12-10 ENCOUNTER — Other Ambulatory Visit (HOSPITAL_COMMUNITY): Payer: Self-pay | Admitting: Student

## 2018-12-21 DIAGNOSIS — Z79899 Other long term (current) drug therapy: Secondary | ICD-10-CM | POA: Diagnosis not present

## 2018-12-21 DIAGNOSIS — M0600 Rheumatoid arthritis without rheumatoid factor, unspecified site: Secondary | ICD-10-CM | POA: Diagnosis not present

## 2018-12-21 DIAGNOSIS — L668 Other cicatricial alopecia: Secondary | ICD-10-CM | POA: Diagnosis not present

## 2019-01-03 DIAGNOSIS — M069 Rheumatoid arthritis, unspecified: Secondary | ICD-10-CM | POA: Diagnosis not present

## 2019-01-03 DIAGNOSIS — Z6841 Body Mass Index (BMI) 40.0 and over, adult: Secondary | ICD-10-CM | POA: Diagnosis not present

## 2019-01-03 DIAGNOSIS — Z23 Encounter for immunization: Secondary | ICD-10-CM | POA: Diagnosis not present

## 2019-01-18 DIAGNOSIS — L668 Other cicatricial alopecia: Secondary | ICD-10-CM | POA: Diagnosis not present

## 2019-02-03 ENCOUNTER — Other Ambulatory Visit (HOSPITAL_COMMUNITY): Payer: Self-pay | Admitting: Student

## 2019-02-03 ENCOUNTER — Other Ambulatory Visit (HOSPITAL_COMMUNITY): Payer: Self-pay | Admitting: Internal Medicine

## 2019-02-09 ENCOUNTER — Other Ambulatory Visit: Payer: Self-pay

## 2019-02-09 ENCOUNTER — Encounter (HOSPITAL_COMMUNITY): Payer: Self-pay | Admitting: Cardiology

## 2019-02-09 ENCOUNTER — Ambulatory Visit (HOSPITAL_COMMUNITY)
Admission: RE | Admit: 2019-02-09 | Discharge: 2019-02-09 | Disposition: A | Discharge status: Home / Self Care | Payer: PPO | Source: Ambulatory Visit | Attending: Cardiology | Admitting: Cardiology

## 2019-02-09 VITALS — BP 123/68 | HR 99 | Wt 234.2 lb

## 2019-02-09 DIAGNOSIS — Z7984 Long term (current) use of oral hypoglycemic drugs: Secondary | ICD-10-CM | POA: Diagnosis not present

## 2019-02-09 DIAGNOSIS — Z7982 Long term (current) use of aspirin: Secondary | ICD-10-CM | POA: Diagnosis not present

## 2019-02-09 DIAGNOSIS — Z8249 Family history of ischemic heart disease and other diseases of the circulatory system: Secondary | ICD-10-CM | POA: Insufficient documentation

## 2019-02-09 DIAGNOSIS — E119 Type 2 diabetes mellitus without complications: Secondary | ICD-10-CM | POA: Diagnosis not present

## 2019-02-09 DIAGNOSIS — I5022 Chronic systolic (congestive) heart failure: Secondary | ICD-10-CM | POA: Diagnosis not present

## 2019-02-09 DIAGNOSIS — M069 Rheumatoid arthritis, unspecified: Secondary | ICD-10-CM | POA: Diagnosis not present

## 2019-02-09 DIAGNOSIS — I110 Hypertensive heart disease with heart failure: Secondary | ICD-10-CM | POA: Insufficient documentation

## 2019-02-09 DIAGNOSIS — I428 Other cardiomyopathies: Secondary | ICD-10-CM | POA: Insufficient documentation

## 2019-02-09 DIAGNOSIS — Z79899 Other long term (current) drug therapy: Secondary | ICD-10-CM | POA: Diagnosis not present

## 2019-02-09 DIAGNOSIS — Z7951 Long term (current) use of inhaled steroids: Secondary | ICD-10-CM | POA: Diagnosis not present

## 2019-02-09 LAB — BASIC METABOLIC PANEL
Anion gap: 15 (ref 5–15)
BUN: 19 mg/dL (ref 6–20)
CO2: 22 mmol/L (ref 22–32)
Calcium: 8.2 mg/dL — ABNORMAL LOW (ref 8.9–10.3)
Chloride: 102 mmol/L (ref 98–111)
Creatinine, Ser: 1.15 mg/dL — ABNORMAL HIGH (ref 0.44–1.00)
GFR calc Af Amer: >60 mL/min (ref >60)
GFR calc non Af Amer: 57 mL/min — ABNORMAL LOW (ref >60)
Glucose, Bld: 143 mg/dL — ABNORMAL HIGH (ref 70–99)
Potassium: 4.3 mmol/L (ref 3.5–5.1)
Sodium: 139 mmol/L (ref 135–145)

## 2019-02-09 LAB — LIPID PANEL
Cholesterol: 164 mg/dL (ref 0–200)
HDL: 48 mg/dL (ref >40)
LDL Cholesterol: 93 mg/dL (ref 0–99)
Total CHOL/HDL Ratio: 3.4 RATIO
Triglycerides: 114 mg/dL (ref <150)
VLDL: 23 mg/dL (ref 0–40)

## 2019-02-09 MED ORDER — METOPROLOL SUCCINATE ER 25 MG PO TB24: 75.0000 mg | ORAL_TABLET | Freq: Two times a day (BID) | ORAL | 6 refills | Status: DC

## 2019-02-09 NOTE — Patient Instructions (Signed)
INCREASE Toprol XL to 75mg  TWICE A DAY  Labs today We will only contact you if something comes back abnormal or we need to make some changes. Otherwise no news is good news!  Your physician has requested that you have an echocardiogram. Echocardiography is a painless test that uses sound waves to create images of your heart. It provides your doctor with information about the size and shape of your heart and how well your heart's chambers and valves are working. This procedure takes approximately one hour. There are no restrictions for this procedure.  Your physician recommends that you schedule a follow-up appointment in: 3 months with an echo  At the [facility], you and your health needs are our priority. As part of our continuing mission to provide you with exceptional heart care, we have created designated Provider Care Teams. These Care Teams include your primary Cardiologist (physician) and Advanced Practice Providers (APPs- Physician Assistants and Nurse Practitioners) who all work together to provide you with the care you need, when you need it.   You may see any of the following providers on your designated Care Team at your next follow up: Marland Kitchen Dr Glori Bickers . Dr Loralie Champagne . Darrick Grinder, NP . Lyda Jester, PA   Please be sure to bring in all your medications bottles to every appointment.

## 2019-02-11 NOTE — Progress Notes (Signed)
PCP: Dr. Hyman Hopes Cardiology: Dr. Clifton James HF Cardiology: Dr. Shirlee Latch  48 y.o. with history of nonischemic cardiomyopathy/chronic systolic CHF and rheumatoid arthritis was referred by Dr. Clifton James for evaluation/management of CHF.    Patient has had a cardiomyopathy known for a number of years now.  Coronary angiography in 2012 showed no significant disease.  EF has been in the 25-30% range long-term.  Most recent echo in 8/18 showed EF remains 25-30%.  She has no family history of cardiomyopathy and she has never been a heavy drinker.  She has not used illicit drugs.  RHC in 10/18 showed preserved cardiac output with mildly increased filling pressures.  Lasix was increased to 120 qam/80 qpm.  She was seen by EP (Camnitz) and decided against ICD.    Echo in 10/19 showed EF 40-45% with diffuse hypokinesis, normal RV.   She returns for followup of CHF.  She has been stable symptomatically.  She is short of breath after walking 2-3 blocks.  No dyspnea walking up a flight of stairs.  No dizziness or lightheadedness.  No orthopnea/PND.    Labs (9/18): K 4.4, creatinine 0.81 Labs (10/18): K 4, creatinine 0.88 Labs (1/19): K 3.9, creatinine 0.66 Labs (10/19): K 3.9, creatinine 0.67 Labs (12/19): creatinine 1.14  ECG (personally reviewed): NSR, LVH, inferolateral TWIs  PMH: 1. Rheumatoid arthritis: Followed by Dr. Lendon Colonel.  2. Type II diabetes.  3. HTN 4. Chronic systolic CHF: Nonischemic cardiomyopathy.  - LHC (2012): No significant CAD.  - Echo (7/13): EF 30-35% - Cardiac MRI (12/14): Mild LV dilation, EF 40%, normal RV size and systolic function, no late gadolinium enhancement.  - Echo (7/16): EF 25-30% - Echo (8/18): EF 25-30% - RHC (10/18): mean RA 11, PA 47/20 mean 32, mean PCWP 16, CI 3.06 Fick/3.5 thermo, PVR 2.65 WU.  - Echo (10/19): EF 40-45%, diffuse hypokinesis, moderate diastolic dysfunction, normal RV size and systolic function  SH: Lives with mother.  Rare ETOH, no drugs.  No  smoking.   Family History  Problem Relation Age of Onset  . Heart attack Mother   . CAD Mother   . Allergies Mother   . Breast cancer Mother   . Hypertension Unknown   . Diabetes Mellitus II Unknown    ROS: All systems reviewed and negative except as per HPI.   Current Outpatient Medications  Medication Sig Dispense Refill  . Adalimumab (HUMIRA) 20 MG/0.2ML PSKT Inject 20 mg into the skin 2 (two) times a week. 4 each   . albuterol (PROAIR HFA) 108 (90 BASE) MCG/ACT inhaler Inhale 2 puffs into the lungs every 6 (six) hours as needed for wheezing or shortness of breath.    Marland Kitchen albuterol (PROVENTIL) (2.5 MG/3ML) 0.083% nebulizer solution Take 2.5 mg by nebulization every 6 (six) hours as needed for wheezing or shortness of breath.    Marland Kitchen aspirin EC 81 MG EC tablet Take 1 tablet (81 mg total) by mouth daily.    Marland Kitchen BIDIL 20-37.5 MG tablet TAKE 2 TABLETS BY MOUTH THREE TIMES DAILY 180 tablet 0  . clotrimazole (LOTRIMIN) 1 % cream Apply 1 application topically 2 (two) times daily.    Marland Kitchen ENTRESTO 97-103 MG Take 1 tablet by mouth twice daily 180 tablet 0  . ferrous gluconate (FERGON) 324 MG tablet Take 324 mg by mouth daily.    . folic acid (FOLVITE) 1 MG tablet Take 1 mg by mouth daily.    . furosemide (LASIX) 40 MG tablet Take 3 tablets (120mg ) in the mornings  and 2 tablets (80mg ) in the evenings by mouth daily (Patient taking differently: Take 120 mg by mouth every morning. ) 450 tablet 3  . hydroxychloroquine (PLAQUENIL) 200 MG tablet Take 2 tablets (400 mg total) by mouth daily.    Marland Kitchen levocetirizine (XYZAL) 5 MG tablet Take 5 mg by mouth at bedtime.     . metFORMIN (GLUCOPHAGE) 500 MG tablet Take 1,000 mg by mouth 2 (two) times daily with a meal.     . methocarbamol (ROBAXIN) 500 MG tablet Take 500 mg by mouth 2 (two) times daily as needed (for muscle spasms.).     Marland Kitchen Methotrexate, PF, 25 MG/0.5ML SOAJ Inject 0.7 mLs into the skin once a week. On Friday    . omeprazole (PRILOSEC) 40 MG capsule Take  1 capsule (40 mg total) by mouth daily before breakfast. 30 capsule 0  . simethicone (MYLICON) 80 MG chewable tablet Chew 2 tablets (160 mg total) by mouth 4 (four) times daily as needed for flatulence. 30 tablet 0  . spironolactone (ALDACTONE) 25 MG tablet Take 1 tablet by mouth once daily 90 tablet 0  . SYMBICORT 160-4.5 MCG/ACT inhaler Inhale 2 puffs into the lungs daily as needed (for respiratory issues.).     Marland Kitchen traMADol (ULTRAM) 50 MG tablet Take 1 tablet (50 mg total) by mouth 4 (four) times daily. pain (Patient taking differently: Take 50 mg by mouth 4 (four) times daily as needed (for pain.). ) 60 tablet 1  . triamcinolone cream (KENALOG) 0.1 % Apply 1 application topically 2 (two) times daily as needed (for ezcema.).    Marland Kitchen valACYclovir (VALTREX) 500 MG tablet Take 500 mg by mouth 2 (two) times daily as needed (for cold sores.).    Marland Kitchen metoprolol succinate (TOPROL-XL) 25 MG 24 hr tablet Take 3 tablets (75 mg total) by mouth 2 (two) times daily. Take with or immediately following a meal. 180 tablet 6   No current facility-administered medications for this encounter.    BP 123/68   Pulse 99   Wt 106.2 kg (234 lb 3.2 oz)   SpO2 98%   BMI 44.25 kg/m  General: NAD, obese. Neck: No JVD, no thyromegaly or thyroid nodule.  Lungs: Clear to auscultation bilaterally with normal respiratory effort. CV: Nondisplaced PMI.  Heart regular S1/S2, no S3/S4, no murmur.  No peripheral edema.  No carotid bruit.  Normal pedal pulses.  Abdomen: Soft, nontender, no hepatosplenomegaly, no distention.  Skin: Intact without lesions or rashes.  Neurologic: Alert and oriented x 3.  Psych: Normal affect. Extremities: No clubbing or cyanosis.  HEENT: Normal.   Assessment/Plan: 1. Chronic systolic CHF: Nonischemic cardiomyopathy.  Etiology uncertain, possible prior viral myocarditis.  CMRI in 12/14 with no LGE noted.  No heavy drinking or drugs.  No history of familial cardiomyopathy.  Echo in 10/19 showed EF  improved to 40-45%.  NYHA class II symptoms.  She does not appear volume overloaded on exam.  - Continue Lasix 120 qam/80 qpm.  BMET today.  - Continue current spironolactone.  - Increase Toprol XL to 75 mg bid.   - Continue Bidil 2 tabs tid.   - Continue Entresto 97/103 bid.  - Repeat echo at followup in 3 months.    2. Rheumatoid arthritis: She sees a rheumatologist.   Followup in 3 months with echo.    Loralie Champagne 02/11/2019

## 2019-02-15 ENCOUNTER — Ambulatory Visit
Admission: RE | Admit: 2019-02-15 | Discharge: 2019-02-15 | Disposition: A | Discharge status: Home / Self Care | Payer: PPO | Source: Ambulatory Visit | Attending: Family Medicine | Admitting: Family Medicine

## 2019-02-15 ENCOUNTER — Other Ambulatory Visit: Payer: Self-pay | Admitting: Family Medicine

## 2019-02-15 DIAGNOSIS — M79602 Pain in left arm: Secondary | ICD-10-CM

## 2019-02-19 DIAGNOSIS — L668 Other cicatricial alopecia: Secondary | ICD-10-CM | POA: Diagnosis not present

## 2019-03-23 DIAGNOSIS — M2550 Pain in unspecified joint: Secondary | ICD-10-CM | POA: Diagnosis not present

## 2019-03-23 DIAGNOSIS — M0600 Rheumatoid arthritis without rheumatoid factor, unspecified site: Secondary | ICD-10-CM | POA: Diagnosis not present

## 2019-03-23 DIAGNOSIS — Z79899 Other long term (current) drug therapy: Secondary | ICD-10-CM | POA: Diagnosis not present

## 2019-03-30 ENCOUNTER — Other Ambulatory Visit (HOSPITAL_COMMUNITY): Payer: Self-pay | Admitting: Cardiology

## 2019-04-02 DIAGNOSIS — M069 Rheumatoid arthritis, unspecified: Secondary | ICD-10-CM | POA: Diagnosis not present

## 2019-04-02 DIAGNOSIS — M167 Other unilateral secondary osteoarthritis of hip: Secondary | ICD-10-CM | POA: Diagnosis not present

## 2019-04-10 ENCOUNTER — Other Ambulatory Visit (HOSPITAL_COMMUNITY): Payer: Self-pay | Admitting: Cardiology

## 2019-04-10 DIAGNOSIS — L668 Other cicatricial alopecia: Secondary | ICD-10-CM | POA: Diagnosis not present

## 2019-05-15 ENCOUNTER — Ambulatory Visit (HOSPITAL_BASED_OUTPATIENT_CLINIC_OR_DEPARTMENT_OTHER)
Admission: RE | Admit: 2019-05-15 | Discharge: 2019-05-15 | Disposition: A | Discharge status: Home / Self Care | Payer: Medicare Other | Source: Ambulatory Visit | Attending: Cardiology | Admitting: Cardiology

## 2019-05-15 ENCOUNTER — Ambulatory Visit (HOSPITAL_COMMUNITY)
Admission: RE | Admit: 2019-05-15 | Discharge: 2019-05-15 | Disposition: A | Discharge status: Home / Self Care | Payer: Medicare Other | Source: Ambulatory Visit | Attending: Cardiology | Admitting: Cardiology

## 2019-05-15 ENCOUNTER — Encounter (HOSPITAL_COMMUNITY): Payer: Self-pay | Admitting: Cardiology

## 2019-05-15 ENCOUNTER — Other Ambulatory Visit: Payer: Self-pay

## 2019-05-15 VITALS — BP 138/73 | HR 77 | Wt 231.0 lb

## 2019-05-15 DIAGNOSIS — E119 Type 2 diabetes mellitus without complications: Secondary | ICD-10-CM | POA: Insufficient documentation

## 2019-05-15 DIAGNOSIS — I5022 Chronic systolic (congestive) heart failure: Secondary | ICD-10-CM

## 2019-05-15 DIAGNOSIS — M069 Rheumatoid arthritis, unspecified: Secondary | ICD-10-CM | POA: Diagnosis not present

## 2019-05-15 DIAGNOSIS — I110 Hypertensive heart disease with heart failure: Secondary | ICD-10-CM | POA: Insufficient documentation

## 2019-05-15 DIAGNOSIS — Z7952 Long term (current) use of systemic steroids: Secondary | ICD-10-CM | POA: Insufficient documentation

## 2019-05-15 DIAGNOSIS — Z79899 Other long term (current) drug therapy: Secondary | ICD-10-CM | POA: Diagnosis not present

## 2019-05-15 DIAGNOSIS — Z8249 Family history of ischemic heart disease and other diseases of the circulatory system: Secondary | ICD-10-CM | POA: Insufficient documentation

## 2019-05-15 DIAGNOSIS — I428 Other cardiomyopathies: Secondary | ICD-10-CM | POA: Diagnosis not present

## 2019-05-15 DIAGNOSIS — Z7984 Long term (current) use of oral hypoglycemic drugs: Secondary | ICD-10-CM | POA: Insufficient documentation

## 2019-05-15 DIAGNOSIS — Z7951 Long term (current) use of inhaled steroids: Secondary | ICD-10-CM | POA: Diagnosis not present

## 2019-05-15 MED ORDER — METOPROLOL SUCCINATE ER 100 MG PO TB24: 100.0000 mg | ORAL_TABLET | Freq: Two times a day (BID) | ORAL | 3 refills | Status: DC

## 2019-05-15 NOTE — Patient Instructions (Signed)
INCREASE Toprol XL to 100mg  (1 tab) twice a day   Labs today We will only contact you if something comes back abnormal or we need to make some changes. Otherwise no news is good news!   Your physician recommends that you schedule a follow-up appointment in: 4 months with Dr   Please call office at [phone] option 2 if you have any questions or concerns.    At [facility], you and your health needs are our priority. As part of our continuing mission to provide you with exceptional heart care, we have created designated Provider Care Teams. These Care Teams include your primary Cardiologist (physician) and Advanced Practice Providers (APPs- Physician Assistants and Nurse Practitioners) who all work together to provide you with the care you need, when you need it.   You may see any of the following providers on your designated Care Team at your next follow up: [phone] Dr Marland Kitchen . Dr Arvilla Meres . Marca Ancona, NP . Tonye Becket, PA . Robbie Lis, PharmD   Please be sure to bring in all your medications bottles to every appointment.

## 2019-05-15 NOTE — Progress Notes (Signed)
PCP: Dr. Justin Mend Cardiology: Dr. Angelena Form HF Cardiology: Dr. Aundra Dubin  49 y.o. with history of nonischemic cardiomyopathy/chronic systolic CHF and rheumatoid arthritis was referred by Dr. Angelena Form for evaluation/management of CHF.    Patient has had a cardiomyopathy known for a number of years now.  Coronary angiography in 2012 showed no significant disease.  EF has been in the 25-30% range long-term.  Most recent echo in 8/18 showed EF remains 25-30%.  She has no family history of cardiomyopathy and she has never been a heavy drinker.  She has not used illicit drugs.  [place] in 10/18 showed preserved cardiac output with mildly increased filling pressures.  Lasix was increased to 120 qam/80 qpm.  She was seen by EP (Camnitz) and decided against ICD.    Echo in 10/19 showed EF 40-45% with diffuse hypokinesis, normal RV.   Echo today showed EF 40-45%, diffuse hypokinesis.   She returns for followup of CHF.  She has been doing well overall.  Weight down 3 lbs. No dyspnea walking on flat ground.  No lightheadedness.  No chest pain. No orthopnea/PND.     Labs (9/18): K 4.4, creatinine 0.81 Labs (10/18): K 4, creatinine 0.88 Labs (1/19): K 3.9, creatinine 0.66 Labs (10/19): K 3.9, creatinine 0.67 Labs (12/19): creatinine 1.14 Labs (10/20): LDL 93, K 4.3, creatinine 1.15  PMH: 1. Rheumatoid arthritis: Followed by Dr. Lenna Gilford.  2. Type II diabetes.  3. HTN 4. Chronic systolic CHF: Nonischemic cardiomyopathy.  - LHC (2012): No significant CAD.  - Echo (7/13): EF 30-35% - Cardiac MRI (12/14): Mild LV dilation, EF 40%, normal RV size and systolic function, no late gadolinium enhancement.  - Echo (7/16): EF 25-30% - Echo (8/18): EF 25-30% - RHC (10/18): mean RA 11, PA 47/20 mean 32, mean PCWP 16, CI 3.06 Fick/3.5 thermo, PVR 2.65 WU.  - Echo (10/19): EF 40-45%, diffuse hypokinesis, moderate diastolic dysfunction, normal RV size and systolic function  - Echo (2/21): EF 40-455, diffuse hypokinesis.   SH:  Lives with mother.  Rare ETOH, no drugs.  No smoking.   Family History  Problem Relation Age of Onset  . Heart attack Mother   . CAD Mother   . Allergies Mother   . Breast cancer Mother   . Hypertension Unknown   . Diabetes Mellitus II Unknown    ROS: All systems reviewed and negative except as per HPI.   Current Outpatient Medications  Medication Sig Dispense Refill  . albuterol (PROAIR HFA) 108 (90 BASE) MCG/ACT inhaler Inhale 2 puffs into the lungs every 6 (six) hours as needed for wheezing or shortness of breath.    Marland Kitchen albuterol (PROVENTIL) (2.5 MG/3ML) 0.083% nebulizer solution Take 2.5 mg by nebulization every 6 (six) hours as needed for wheezing or shortness of breath.    . betamethasone dipropionate (DIPROLENE) 0.05 % ointment Apply BID every other day to scalp. Avoid face, groin, underarms    . BIDIL 20-37.5 MG tablet TAKE 2 TABLETS BY MOUTH THREE TIMES DAILY 180 tablet 0  . clotrimazole (LOTRIMIN) 1 % cream Apply 1 application topically 2 (two) times daily.    . cyclobenzaprine (FLEXERIL) 10 MG tablet Take 10 mg by mouth at bedtime.    Marland Kitchen ENTRESTO 97-103 MG Take 1 tablet by mouth twice daily 180 tablet 0  . ferrous gluconate (FERGON) 324 MG tablet Take 324 mg by mouth daily.    . Fluocinolone Acetonide Scalp 0.01 % OIL     . folic acid (FOLVITE) 1 MG tablet Take  1 mg by mouth daily.    . furosemide (LASIX) 40 MG tablet Take 3 tablets by mouth every morning and 2 tablets every evening    . hydroxychloroquine (PLAQUENIL) 200 MG tablet Take 2 tablets (400 mg total) by mouth daily.    Marland Kitchen KLOR-CON M20 20 MEQ tablet Add'l Sig Add'l Sig oral Add'l Sig    . leflunomide (ARAVA) 20 MG tablet Take 20 mg by mouth daily.    Marland Kitchen levocetirizine (XYZAL) 5 MG tablet Take 5 mg by mouth at bedtime.     . lidocaine (LIDODERM) 5 % APPLY HALF A PATCH TO AFFECTED AREA AS NEEDED BEFORE INJECTION OF ENBREL    . metFORMIN (GLUCOPHAGE) 500 MG tablet Take 1,000 mg by mouth 2 (two) times daily with a meal.      . methocarbamol (ROBAXIN) 500 MG tablet Take 500 mg by mouth 2 (two) times daily as needed (for muscle spasms.).     Marland Kitchen Methotrexate, PF, 25 MG/0.5ML SOAJ Inject 0.7 mLs into the skin once a week. On Friday    . metoprolol succinate (TOPROL-XL) 100 MG 24 hr tablet Take 1 tablet (100 mg total) by mouth 2 (two) times daily. Take with or immediately following a meal. 60 tablet 3  . omeprazole (PRILOSEC) 40 MG capsule Take 1 capsule (40 mg total) by mouth daily before breakfast. 30 capsule 0  . predniSONE (DELTASONE) 5 MG tablet Take 5 mg by mouth daily.    . simethicone (MYLICON) 80 MG chewable tablet Chew 2 tablets (160 mg total) by mouth 4 (four) times daily as needed for flatulence. 30 tablet 0  . spironolactone (ALDACTONE) 25 MG tablet Take 1 tablet by mouth once daily 90 tablet 0  . SYMBICORT 160-4.5 MCG/ACT inhaler Inhale 2 puffs into the lungs daily as needed (for respiratory issues.).     Marland Kitchen Tocilizumab (ACTEMRA ACTPEN) 162 MG/0.9ML SOAJ Inject into the skin.    Marland Kitchen traMADol (ULTRAM) 50 MG tablet Take 50 mg by mouth every 4 (four) hours as needed for moderate pain or severe pain.    Marland Kitchen triamcinolone cream (KENALOG) 0.1 % Apply 1 application topically 2 (two) times daily as needed (for ezcema.).    Marland Kitchen valACYclovir (VALTREX) 500 MG tablet Take 500 mg by mouth 2 (two) times daily as needed (for cold sores.).     No current facility-administered medications for this encounter.   BP 138/73   Pulse 77   Wt 104.8 kg (231 lb)   SpO2 98%   BMI 43.65 kg/m  General: NAD Neck: No JVD, no thyromegaly or thyroid nodule.  Lungs: Clear to auscultation bilaterally with normal respiratory effort. CV: Nondisplaced PMI.  Heart regular S1/S2, no S3/S4, no murmur.  No peripheral edema.  No carotid bruit.  Normal pedal pulses.  Abdomen: Soft, nontender, no hepatosplenomegaly, no distention.  Skin: Intact without lesions or rashes.  Neurologic: Alert and oriented x 3.  Psych: Normal affect. Extremities: No  clubbing or cyanosis.  HEENT: Normal.   Assessment/Plan: 1. Chronic systolic CHF: Nonischemic cardiomyopathy.  Etiology uncertain, possible prior viral myocarditis.  CMRI in 12/14 with no LGE noted.  No heavy drinking or drugs.  No history of familial cardiomyopathy.  Echo done today showed stable EF 40-45%.  NYHA class II symptoms.  She does not appear volume overloaded on exam.  - Continue Lasix 120 qam/80 qpm.  BMET today.  - Continue current spironolactone.  - Increase Toprol XL to 100 mg bid.   - Continue Bidil 2 tabs  tid.   - Continue Entresto 97/103 bid.    2. Rheumatoid arthritis: She sees a rheumatologist.   Followup in 4 months.     Marca Ancona 05/15/2019

## 2019-05-15 NOTE — Progress Notes (Signed)
  Echocardiogram 2D Echocardiogram has been performed.  Janalyn Harder 05/15/2019, 9:51 AM

## 2019-06-22 ENCOUNTER — Other Ambulatory Visit: Payer: Self-pay | Admitting: Obstetrics and Gynecology

## 2019-06-22 DIAGNOSIS — Z1231 Encounter for screening mammogram for malignant neoplasm of breast: Secondary | ICD-10-CM

## 2019-06-27 ENCOUNTER — Other Ambulatory Visit: Payer: Self-pay

## 2019-06-27 ENCOUNTER — Ambulatory Visit
Admission: RE | Admit: 2019-06-27 | Discharge: 2019-06-27 | Disposition: A | Discharge status: Home / Self Care | Payer: Medicare Other | Source: Ambulatory Visit | Attending: Obstetrics and Gynecology | Admitting: Obstetrics and Gynecology

## 2019-06-27 DIAGNOSIS — Z1231 Encounter for screening mammogram for malignant neoplasm of breast: Secondary | ICD-10-CM

## 2019-07-02 ENCOUNTER — Ambulatory Visit: Payer: Medicare Other

## 2019-07-05 ENCOUNTER — Ambulatory Visit: Payer: Medicare Other | Attending: Internal Medicine

## 2019-07-05 DIAGNOSIS — Z23 Encounter for immunization: Secondary | ICD-10-CM

## 2019-07-05 NOTE — Progress Notes (Signed)
   Covid-[facility] Vaccination [facility]  Name:  [name]    MRN: [id number] DOB: 1970-06-30  07/05/2019  [name] was observed post Covid-19 immunization for 15 minutes without incident. She was provided with Vaccine Information Sheet and instruction to access the V-Safe system.   [name] was instructed to call [phone] with any severe reactions post vaccine: Marland Kitchen Difficulty breathing  . Swelling of face and throat  . A fast heartbeat  . A bad rash all over body  . Dizziness and weakness   Immunizations Administered    Name Date Dose VIS Date Route   Pfizer COVID-19 Vaccine 07/05/2019 11:18 AM 0.3 mL 03/23/2019 Intramuscular   Manufacturer: [employer], [employer]   Lot: [id number]   NDC: [address]-0

## 2019-07-30 ENCOUNTER — Emergency Department (HOSPITAL_BASED_OUTPATIENT_CLINIC_OR_DEPARTMENT_OTHER)
Admission: EM | Admit: 2019-07-30 | Discharge: 2019-07-30 | Disposition: A | Discharge status: Home / Self Care | Payer: Medicare Other | Attending: Emergency Medicine | Admitting: Emergency Medicine

## 2019-07-30 ENCOUNTER — Other Ambulatory Visit: Payer: Self-pay

## 2019-07-30 ENCOUNTER — Encounter (HOSPITAL_BASED_OUTPATIENT_CLINIC_OR_DEPARTMENT_OTHER): Payer: Self-pay | Admitting: *Deleted

## 2019-07-30 DIAGNOSIS — Z79899 Other long term (current) drug therapy: Secondary | ICD-10-CM | POA: Diagnosis not present

## 2019-07-30 DIAGNOSIS — R1310 Dysphagia, unspecified: Secondary | ICD-10-CM | POA: Diagnosis not present

## 2019-07-30 DIAGNOSIS — E119 Type 2 diabetes mellitus without complications: Secondary | ICD-10-CM | POA: Insufficient documentation

## 2019-07-30 DIAGNOSIS — Z7984 Long term (current) use of oral hypoglycemic drugs: Secondary | ICD-10-CM | POA: Diagnosis not present

## 2019-07-30 DIAGNOSIS — I10 Essential (primary) hypertension: Secondary | ICD-10-CM | POA: Insufficient documentation

## 2019-07-30 DIAGNOSIS — R070 Pain in throat: Secondary | ICD-10-CM | POA: Diagnosis present

## 2019-07-30 LAB — GROUP A STREP BY PCR: Group A Strep by PCR: NOT DETECTED

## 2019-07-30 MED ORDER — LIDOCAINE VISCOUS HCL 2 % MT SOLN
15.0000 mL | Freq: Once | OROMUCOSAL | Status: AC
  Administered 2019-07-30: 15 mL via ORAL
  Filled 2019-07-30: qty 15

## 2019-07-30 MED ORDER — ALUM & MAG HYDROXIDE-SIMETH 200-200-20 MG/5ML PO SUSP
30.0000 mL | Freq: Once | ORAL | Status: AC
  Administered 2019-07-30: 30 mL via ORAL
  Filled 2019-07-30: qty 30

## 2019-07-30 MED ORDER — DEXAMETHASONE SODIUM PHOSPHATE 10 MG/ML IJ SOLN
10.0000 mg | Freq: Once | INTRAMUSCULAR | Status: AC
  Administered 2019-07-30: 18:00:00 10 mg via INTRAMUSCULAR
  Filled 2019-07-30: qty 1

## 2019-07-30 NOTE — ED Provider Notes (Signed)
[facility] EMERGENCY DEPARTMENT Provider Note   CSN: [id number] Arrival date & time: 07/30/19  5573     History Chief Complaint  Patient presents with  . Sore Throat    [name] is a 49 y.o. female.  49 yo F with a chief complaints of a sensation that she is having difficulty swallowing.  This M2 about a week ago she was trying to drink something and felt like she could not swallow.  Lasted for a short period of time and then resolved.  Occurred again today when she was trying to eat.  She had food in her mouth and she thinks it was stuck in there for about 20 minutes and then eventually she vomited.  She has been able to tolerate liquids since then but is a bit hesitant to try anything else.  She denies sore throat denies one-sided numbness or weakness denies difficulty speech swallowing denies headache denies head trauma denies neck pain.  She also tells me that she has had a significant issue with postnasal drip.  Has seen multiple physicians for this and has tried multiple different medications.  She even had an endoscopy at some point and felt it was normal.  The history is provided by the patient.  Sore Throat Pertinent negatives include no chest pain, no headaches and no shortness of breath.  Illness Severity:  Moderate Onset quality:  Gradual Duration:  5 minutes Timing:  Constant Progression:  Partially resolved Chronicity:  New Associated symptoms: no chest pain, no congestion, no fever, no headaches, no myalgias, no nausea, no rhinorrhea, no shortness of breath, no vomiting and no wheezing        Past Medical History:  Diagnosis Date  . Allergy   . Asthma   . Cardiomyopathy    Non-ischemic  . Diabetes mellitus without complication (HCC)   . Herpes simplex of female genitalia   . Hypertension   . Rheumatoid arthritis(714.0)     Patient Active Problem List   Diagnosis Date Noted  . Diverticulitis of large intestine with abscess without  bleeding   . Perforation of sigmoid colon due to diverticulitis 03/18/2018  . Abscess of sigmoid colon due to diverticulitis s/p perc drainage 03/15/2018 03/14/2018  . Chronic systolic heart failure (HCC) 05/20/2017  . Alopecia areata 08/26/2016  . Palpitations 10/31/2014  . Non-ischemic cardiomyopathy (HCC) 10/31/2014  . Pulmonary infiltrates 08/01/2013  . Cough 08/01/2013  . 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 Rheumatoid arthritis (HCC) 05/24/2012  . Hypertension 03/03/2012  . GERD (gastroesophageal reflux disease) 03/03/2012  . Morbid obesity,BMI of 45.0-49.9, adult 03/03/2012  . Rheumatoid arthritis(714.0) 03/03/2012  . Diabetes mellitus, type 2 (HCC) 03/03/2012  . Hypokalemia 03/03/2012  . Lupus (HCC) 03/02/2012  . ASTHMA 06/04/2010    Past Surgical History:  Procedure Laterality Date  . DENTAL SURGERY    . OVARY SURGERY    . RIGHT HEART CATH N/A 01/18/2017   Procedure: RIGHT HEART CATH;  Surgeon: Laurey Morale, MD;  Location: [facility] INVASIVE CV LAB;  Service: Cardiovascular;  Laterality: N/A;     OB History   No obstetric history on file.     Family History  Problem Relation Age of Onset  . Heart attack Mother   . CAD Mother   . Allergies Mother   . Breast cancer Mother   . Hypertension Other   . Diabetes Mellitus II Other     Social History  Tobacco Use  . Smoking status: Never Smoker  . Smokeless tobacco: Never Used  Substance Use Topics  . Alcohol use: No  . Drug use: No    Home Medications Prior to Admission medications   Medication Sig Start Date End Date Taking? Authorizing Provider  albuterol (PROAIR HFA) 108 (90 BASE) MCG/ACT inhaler Inhale 2 puffs into the lungs every 6 (six) hours as needed for wheezing or shortness of breath.    [provider]  albuterol (PROVENTIL) (2.5 MG/3ML) 0.083% nebulizer solution Take 2.5 mg by nebulization every 6 (six) hours as needed for  wheezing or shortness of breath.    [provider]  betamethasone dipropionate (DIPROLENE) 0.05 % ointment Apply BID every other day to scalp. Avoid face, groin, underarms 05/03/19   [provider]  BIDIL 20-37.5 MG tablet TAKE 2 TABLETS BY MOUTH THREE TIMES DAILY 03/30/19   Laurey Morale, MD  clotrimazole (LOTRIMIN) 1 % cream Apply 1 application topically 2 (two) times daily. 03/13/18   [provider]  cyclobenzaprine (FLEXERIL) 10 MG tablet Take 10 mg by mouth at bedtime. 05/06/19   [provider]  ENTRESTO 97-103 MG Take 1 tablet by mouth twice daily 04/11/19   Laurey Morale, MD  ferrous gluconate (FERGON) 324 MG tablet Take 324 mg by mouth daily.    [provider]  Fluocinolone Acetonide Scalp 0.01 % OIL  04/26/19   [provider]  folic acid (FOLVITE) 1 MG tablet Take 1 mg by mouth daily.    [provider]  furosemide (LASIX) 40 MG tablet Take 3 tablets by mouth every morning and 2 tablets every evening    [provider]  hydroxychloroquine (PLAQUENIL) 200 MG tablet Take 2 tablets (400 mg total) by mouth daily. 04/06/18   Rodolph Bong, MD  KLOR-CON M20 20 MEQ tablet Add'l Sig Add'l Sig oral Add'l Sig 05/03/19   [provider]  leflunomide (ARAVA) 20 MG tablet Take 20 mg by mouth daily. 03/23/19   [provider]  levocetirizine (XYZAL) 5 MG tablet Take 5 mg by mouth at bedtime.     [provider]  lidocaine (LIDODERM) 5 % APPLY HALF A PATCH TO AFFECTED AREA AS NEEDED BEFORE INJECTION OF ENBREL 04/18/19   [provider]  metFORMIN (GLUCOPHAGE) 500 MG tablet Take 1,000 mg by mouth 2 (two) times daily with a meal.     [provider]  methocarbamol (ROBAXIN) 500 MG tablet Take 500 mg by mouth 2 (two) times daily as needed (for muscle spasms.).     [provider]  Methotrexate, PF, 25 MG/0.5ML SOAJ Inject 0.7 mLs into the skin once a week. On Friday 04/06/18    Rodolph Bong, MD  metoprolol succinate (TOPROL-XL) 100 MG 24 hr tablet Take 1 tablet (100 mg total) by mouth 2 (two) times daily. Take with or immediately following a meal. 05/15/19   Laurey Morale, MD  omeprazole (PRILOSEC) 40 MG capsule Take 1 capsule (40 mg total) by mouth daily before breakfast. 03/28/18   Rodolph Bong, MD  predniSONE (DELTASONE) 5 MG tablet Take 5 mg by mouth daily. 03/23/19   [provider]  simethicone (MYLICON) 80 MG chewable tablet Chew 2 tablets (160 mg total) by mouth 4 (four) times daily as needed for flatulence. 03/28/18   Rodolph Bong, MD  spironolactone (ALDACTONE) 25 MG tablet Take 1 tablet by mouth once daily 02/05/19   Bensimhon, Bevelyn Buckles, MD  SYMBICORT 160-4.5  MCG/ACT inhaler Inhale 2 puffs into the lungs daily as needed (for respiratory issues.).  06/01/13   [provider]  Tocilizumab (ACTEMRA ACTPEN) 162 MG/0.9ML SOAJ Inject into the skin. 03/23/19   [provider]  traMADol (ULTRAM) 50 MG tablet Take 50 mg by mouth every 4 (four) hours as needed for moderate pain or severe pain.    [provider]  triamcinolone cream (KENALOG) 0.1 % Apply 1 application topically 2 (two) times daily as needed (for ezcema.).    [provider]  valACYclovir (VALTREX) 500 MG tablet Take 500 mg by mouth 2 (two) times daily as needed (for cold sores.).    [provider]    Allergies    Clarithromycin, Hydrocodone, Ondansetron, Penicillins, and Tdap [tetanus-diphth-acell pertussis]  Review of Systems   Review of Systems  Constitutional: Negative for chills and fever.  HENT: Negative for congestion and rhinorrhea.   Eyes: Negative for redness and visual disturbance.  Respiratory: Negative for shortness of breath and wheezing.   Cardiovascular: Negative for chest pain and palpitations.  Gastrointestinal: Negative for nausea and vomiting.  Genitourinary: Negative for dysuria and urgency.    Musculoskeletal: Negative for arthralgias and myalgias.  Skin: Negative for pallor and wound.  Neurological: Negative for dizziness and headaches.    Physical Exam Updated Vital Signs BP (!) 141/83 (BP Location: Right Arm)   Pulse 82   Temp 98.4 F (36.9 C) (Oral)   Resp 16   Ht 5\' 1"  (1.549 m)   Wt 105.2 kg   LMP 06/21/2019 (Approximate)   SpO2 100%   BMI 43.84 kg/m   Physical Exam Vitals and nursing note reviewed.  Constitutional:      General: She is not in acute distress.    Appearance: She is well-developed. She is not diaphoretic.  HENT:     Head: Normocephalic and atraumatic.     Mouth/Throat:     Comments: Significant post nasal drip.  Eyes:     Pupils: Pupils are equal, round, and reactive to light.  Cardiovascular:     Rate and Rhythm: Normal rate and regular rhythm.     Heart sounds: No murmur. No friction rub. No gallop.   Pulmonary:     Effort: Pulmonary effort is normal.     Breath sounds: No wheezing or rales.  Abdominal:     General: There is no distension.     Palpations: Abdomen is soft.     Tenderness: There is no abdominal tenderness.  Musculoskeletal:        General: No tenderness.     Cervical back: Normal range of motion and neck supple.  Skin:    General: Skin is warm and dry.  Neurological:     Mental Status: She is alert and oriented to person, place, and time.     Cranial Nerves: Cranial nerves are intact.     Sensory: Sensation is intact.     Motor: Motor function is intact.     Coordination: Coordination is intact.     Comments: Benign neurologic exam.  Psychiatric:        Behavior: Behavior normal.     ED Results / Procedures / Treatments   Labs (all labs ordered are listed, but only abnormal results are displayed) Labs Reviewed  GROUP A STREP BY PCR    EKG None  Radiology No results found.  Procedures Procedures (including critical care time)  Medications Ordered in ED Medications  alum & mag hydroxide-simeth  (MAALOX/MYLANTA) 200-200-20 MG/5ML  suspension 30 mL (30 mLs Oral Given 07/30/19 1738)    And  lidocaine (XYLOCAINE) 2 % viscous mouth solution 15 mL (15 mLs Oral Given 07/30/19 1738)  dexamethasone (DECADRON) injection 10 mg (10 mg Intramuscular Given 07/30/19 1748)    ED Course  I have reviewed the triage vital signs and the nursing notes.  Pertinent labs & imaging results that were available during my care of the patient were reviewed by me and considered in my medical decision making (see chart for details).    MDM Rules/Calculators/A&P                      49 yo F with a chief complaints of difficulty swallowing.  I think found on exam is that the patient has significant postnasal drip.  It is appears to be caked on the back of her oropharynx, is yellowish in consistency.  I wonder if just how thick that is in the back of her throat made her feel like she had a sensation of difficulty swallowing.  She states she was able to take liquids earlier we will do an oral trial here.  Reassess.  Able to tolerate by mouth without issue.  D/c home.   PCP follow up.  6:55 PM:  I have discussed the diagnosis/risks/treatment options with the patient and believe the pt to be eligible for discharge home to follow-up with PCP. We also discussed returning to the ED immediately if new or worsening sx occur. We discussed the sx which are most concerning (e.g., sudden worsening pain, fever, inability to tolerate by mouth) that necessitate immediate return. Medications administered to the patient during their visit and any new prescriptions provided to the patient are listed below.  Medications given during this visit Medications  alum & mag hydroxide-simeth (MAALOX/MYLANTA) 200-200-20 MG/5ML suspension 30 mL (30 mLs Oral Given 07/30/19 1738)    And  lidocaine (XYLOCAINE) 2 % viscous mouth solution 15 mL (15 mLs Oral Given 07/30/19 1738)  dexamethasone (DECADRON) injection 10 mg (10 mg Intramuscular Given  07/30/19 1748)     The patient appears reasonably screen and/or stabilized for discharge and I doubt any other medical condition or other [facility] requiring further screening, evaluation, or treatment in the ED at this time prior to discharge.    Final Clinical Impression(s) / ED Diagnoses Final diagnoses:  Dysphagia, unspecified type    Rx / DC Orders ED Discharge Orders    None       Deno Etienne, DO 07/30/19 1855

## 2019-07-30 NOTE — ED Triage Notes (Addendum)
Throat problems off and on  x 4 days   States has spells that is unable to swallow  Feels like throat stops working  Denies pain

## 2019-07-30 NOTE — Discharge Instructions (Signed)
I am unsure of the exact cause of your difficulty swallowing.  This may be something you would want to be seen by a gastroenterologist so he can have a swallow study or repeat endoscopy.  Alternatively they may want to send you to an ear nose and throat doctor for your chronic postnasal drip as this could also be the cause of some your symptoms.

## 2019-07-31 ENCOUNTER — Emergency Department (HOSPITAL_BASED_OUTPATIENT_CLINIC_OR_DEPARTMENT_OTHER)
Admission: EM | Admit: 2019-07-31 | Discharge: 2019-07-31 | Disposition: A | Discharge status: Home / Self Care | Payer: Medicare Other | Attending: Emergency Medicine | Admitting: Emergency Medicine

## 2019-07-31 ENCOUNTER — Other Ambulatory Visit: Payer: Self-pay

## 2019-07-31 ENCOUNTER — Other Ambulatory Visit (HOSPITAL_COMMUNITY): Payer: Self-pay | Admitting: Internal Medicine

## 2019-07-31 ENCOUNTER — Encounter (HOSPITAL_BASED_OUTPATIENT_CLINIC_OR_DEPARTMENT_OTHER): Payer: Self-pay

## 2019-07-31 DIAGNOSIS — E876 Hypokalemia: Secondary | ICD-10-CM | POA: Diagnosis not present

## 2019-07-31 DIAGNOSIS — Z888 Allergy status to other drugs, medicaments and biological substances status: Secondary | ICD-10-CM | POA: Diagnosis not present

## 2019-07-31 DIAGNOSIS — Z881 Allergy status to other antibiotic agents status: Secondary | ICD-10-CM | POA: Insufficient documentation

## 2019-07-31 DIAGNOSIS — R200 Anesthesia of skin: Secondary | ICD-10-CM | POA: Diagnosis present

## 2019-07-31 DIAGNOSIS — Z880 Allergy status to penicillin: Secondary | ICD-10-CM | POA: Insufficient documentation

## 2019-07-31 DIAGNOSIS — Z79899 Other long term (current) drug therapy: Secondary | ICD-10-CM | POA: Insufficient documentation

## 2019-07-31 DIAGNOSIS — I110 Hypertensive heart disease with heart failure: Secondary | ICD-10-CM | POA: Insufficient documentation

## 2019-07-31 DIAGNOSIS — I5022 Chronic systolic (congestive) heart failure: Secondary | ICD-10-CM | POA: Diagnosis not present

## 2019-07-31 DIAGNOSIS — Z7984 Long term (current) use of oral hypoglycemic drugs: Secondary | ICD-10-CM | POA: Diagnosis not present

## 2019-07-31 DIAGNOSIS — Z91048 Other nonmedicinal substance allergy status: Secondary | ICD-10-CM | POA: Insufficient documentation

## 2019-07-31 DIAGNOSIS — Z885 Allergy status to narcotic agent status: Secondary | ICD-10-CM | POA: Diagnosis not present

## 2019-07-31 DIAGNOSIS — E119 Type 2 diabetes mellitus without complications: Secondary | ICD-10-CM | POA: Insufficient documentation

## 2019-07-31 LAB — BASIC METABOLIC PANEL
Anion gap: 13 (ref 5–15)
BUN: 21 mg/dL — ABNORMAL HIGH (ref 6–20)
CO2: 23 mmol/L (ref 22–32)
Calcium: 7.9 mg/dL — ABNORMAL LOW (ref 8.9–10.3)
Chloride: 107 mmol/L (ref 98–111)
Creatinine, Ser: 0.97 mg/dL (ref 0.44–1.00)
GFR calc Af Amer: >60 mL/min (ref >60)
GFR calc non Af Amer: >60 mL/min (ref >60)
Glucose, Bld: 163 mg/dL — ABNORMAL HIGH (ref 70–99)
Potassium: 2.7 mmol/L — CL (ref 3.5–5.1)
Sodium: 143 mmol/L (ref 135–145)

## 2019-07-31 LAB — MAGNESIUM: Magnesium: 1.1 mg/dL — ABNORMAL LOW (ref 1.7–2.4)

## 2019-07-31 MED ORDER — CALCIUM CARBONATE ANTACID 500 MG PO CHEW
400.0000 mg | CHEWABLE_TABLET | Freq: Once | ORAL | Status: AC
  Administered 2019-07-31: 400 mg via ORAL
  Filled 2019-07-31: qty 2

## 2019-07-31 MED ORDER — POTASSIUM CHLORIDE CRYS ER 20 MEQ PO TBCR
40.0000 meq | EXTENDED_RELEASE_TABLET | Freq: Once | ORAL | Status: AC
  Administered 2019-07-31: 40 meq via ORAL
  Filled 2019-07-31: qty 2

## 2019-07-31 MED ORDER — POTASSIUM CHLORIDE CRYS ER 20 MEQ PO TBCR: 20.0000 meq | EXTENDED_RELEASE_TABLET | Freq: Every day | ORAL | 0 refills | Status: DC

## 2019-07-31 MED ORDER — POTASSIUM CHLORIDE 10 MEQ/100ML IV SOLN
10.0000 meq | INTRAVENOUS | Status: AC
  Administered 2019-07-31 (×2): 10 meq via INTRAVENOUS
  Filled 2019-07-31 (×2): qty 100

## 2019-07-31 MED ORDER — MAGNESIUM SULFATE 2 GM/50ML IV SOLN
2.0000 g | Freq: Once | INTRAVENOUS | Status: AC
  Administered 2019-07-31: 2 g via INTRAVENOUS
  Filled 2019-07-31: qty 50

## 2019-07-31 NOTE — ED Provider Notes (Signed)
[facility] EMERGENCY DEPARTMENT Provider Note   CSN: [id number] Arrival date & time: 07/31/19  1609     History Chief Complaint  Patient presents with  . Numbness    [name] is a 49 y.o. female.  49yo F w/ PMH including nonischemic cardiomyopathy, hypertension, type 2 diabetes mellitus, rheumatoid arthritis who presents with numbness.  Patient began having full body numbness involving both arms and both legs yesterday.  Symptoms have been constant since they began.  She denies any associated weakness.  No neck or back pain.  No headaches, vision changes, speech problems.  She denies any infectious symptoms including no cough, sore throat, vomiting, diarrhea, fevers, urinary symptoms, or recent illness.  She notes that her mom is currently in the hospital and because she has been going back and forth to the hospital, she did miss her medications for the past 2 days but took them today.  She denies any recent changes to her medications or new medications.  No muscle twitching or spasm.  The history is provided by the patient.       Past Medical History:  Diagnosis Date  . Allergy   . Asthma   . Cardiomyopathy    Non-ischemic  . Diabetes mellitus without complication (HCC)   . Herpes simplex of female genitalia   . Hypertension   . Rheumatoid arthritis(714.0)     Patient Active Problem List   Diagnosis Date Noted  . Diverticulitis of large intestine with abscess without bleeding   . Perforation of sigmoid colon due to diverticulitis 03/18/2018  . Abscess of sigmoid colon due to diverticulitis s/p perc drainage 03/15/2018 03/14/2018  . Chronic systolic heart failure (HCC) 05/20/2017  . Alopecia areata 08/26/2016  . Palpitations 10/31/2014  . Non-ischemic cardiomyopathy (HCC) 10/31/2014  . Pulmonary infiltrates 08/01/2013  . Cough 08/01/2013  . 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 Rheumatoid  arthritis (HCC) 05/24/2012  . Hypertension 03/03/2012  . GERD (gastroesophageal reflux disease) 03/03/2012  . Morbid obesity,BMI of 45.0-49.9, adult 03/03/2012  . Rheumatoid arthritis(714.0) 03/03/2012  . Diabetes mellitus, type 2 (HCC) 03/03/2012  . Hypokalemia 03/03/2012  . Lupus (HCC) 03/02/2012  . ASTHMA 06/04/2010    Past Surgical History:  Procedure Laterality Date  . DENTAL SURGERY    . OVARY SURGERY    . RIGHT HEART CATH N/A 01/18/2017   Procedure: RIGHT HEART CATH;  Surgeon: Laurey Morale, MD;  Location: [facility] INVASIVE CV LAB;  Service: Cardiovascular;  Laterality: N/A;     OB History   No obstetric history on file.     Family History  Problem Relation Age of Onset  . Heart attack Mother   . CAD Mother   . Allergies Mother   . Breast cancer Mother   . Hypertension Other   . Diabetes Mellitus II Other     Social History   Tobacco Use  . Smoking status: Never Smoker  . Smokeless tobacco: Never Used  Substance Use Topics  . Alcohol use: No  . Drug use: No    Home Medications Prior to Admission medications   Medication Sig Start Date End Date Taking? Authorizing Provider  albuterol (PROAIR HFA) 108 (90 BASE) MCG/ACT inhaler Inhale 2 puffs into the lungs every 6 (six) hours as needed for wheezing or shortness of breath.    [provider]  albuterol (PROVENTIL) (2.5 MG/3ML) 0.083% nebulizer solution Take 2.5 mg by nebulization every 6 (six)  hours as needed for wheezing or shortness of breath.    [provider]  betamethasone dipropionate (DIPROLENE) 0.05 % ointment Apply BID every other day to scalp. Avoid face, groin, underarms 05/03/19   [provider]  BIDIL 20-37.5 MG tablet TAKE 2 TABLETS BY MOUTH THREE TIMES DAILY 03/30/19   Laurey Morale, MD  clotrimazole (LOTRIMIN) 1 % cream Apply 1 application topically 2 (two) times daily. 03/13/18   [provider]  cyclobenzaprine (FLEXERIL) 10 MG tablet Take 10 mg by mouth at  bedtime. 05/06/19   [provider]  ENTRESTO 97-103 MG Take 1 tablet by mouth twice daily 04/11/19   Laurey Morale, MD  ferrous gluconate (FERGON) 324 MG tablet Take 324 mg by mouth daily.    [provider]  Fluocinolone Acetonide Scalp 0.01 % OIL  04/26/19   [provider]  folic acid (FOLVITE) 1 MG tablet Take 1 mg by mouth daily.    [provider]  furosemide (LASIX) 40 MG tablet Take 3 tablets by mouth every morning and 2 tablets every evening    [provider]  hydroxychloroquine (PLAQUENIL) 200 MG tablet Take 2 tablets (400 mg total) by mouth daily. 04/06/18   Rodolph Bong, MD  leflunomide (ARAVA) 20 MG tablet Take 20 mg by mouth daily. 03/23/19   [provider]  levocetirizine (XYZAL) 5 MG tablet Take 5 mg by mouth at bedtime.     [provider]  lidocaine (LIDODERM) 5 % APPLY HALF A PATCH TO AFFECTED AREA AS NEEDED BEFORE INJECTION OF ENBREL 04/18/19   [provider]  metFORMIN (GLUCOPHAGE) 500 MG tablet Take 1,000 mg by mouth 2 (two) times daily with a meal.     [provider]  methocarbamol (ROBAXIN) 500 MG tablet Take 500 mg by mouth 2 (two) times daily as needed (for muscle spasms.).     [provider]  Methotrexate, PF, 25 MG/0.5ML SOAJ Inject 0.7 mLs into the skin once a week. On Friday 04/06/18   Rodolph Bong, MD  metoprolol succinate (TOPROL-XL) 100 MG 24 hr tablet Take 1 tablet (100 mg total) by mouth 2 (two) times daily. Take with or immediately following a meal. 05/15/19   Laurey Morale, MD  omeprazole (PRILOSEC) 40 MG capsule Take 1 capsule (40 mg total) by mouth daily before breakfast. 03/28/18   Rodolph Bong, MD  potassium chloride SA (KLOR-CON) 20 MEQ tablet Take 1 tablet (20 mEq total) by mouth daily. 07/31/19   Laster Appling, Ambrose Finland, MD  predniSONE (DELTASONE) 5 MG tablet Take 5 mg by mouth daily. 03/23/19   [provider]  simethicone (MYLICON) 80  MG chewable tablet Chew 2 tablets (160 mg total) by mouth 4 (four) times daily as needed for flatulence. 03/28/18   Rodolph Bong, MD  spironolactone (ALDACTONE) 25 MG tablet Take 1 tablet by mouth once daily 07/31/19   Laurey Morale, MD  [facility] 160-4.5 MCG/ACT inhaler Inhale 2 puffs into the lungs daily as needed (for respiratory issues.).  06/01/13   [provider]  Tocilizumab (ACTEMRA ACTPEN) 162 MG/0.9ML SOAJ Inject into the skin. 03/23/19   [provider]  traMADol (ULTRAM) 50 MG tablet Take 50 mg by mouth every 4 (four) hours as needed for moderate pain or severe pain.    [provider]  triamcinolone cream (KENALOG) 0.1 % Apply 1 application topically 2 (two) times daily as needed (for ezcema.).    [provider]  valACYclovir (VALTREX) 500 MG tablet Take 500 mg by mouth 2 (two) times daily as needed (for cold sores.).    [provider]    Allergies    Clarithromycin, Hydrocodone, Ondansetron, Penicillins, and Tdap [tetanus-diphth-acell pertussis]  Review of Systems   Review of Systems All other systems reviewed and are negative except that which was mentioned in HPI  Physical Exam Updated Vital Signs BP 114/74 (BP Location: Right Arm)   Pulse 80   Temp 98.9 F (37.2 C) (Oral)   Resp 20   LMP 06/26/2019   SpO2 98%   Physical Exam Vitals and nursing note reviewed.  Constitutional:      General: She is not in acute distress.    Appearance: She is well-developed.     Comments: Awake, alert  HENT:     Head: Normocephalic and atraumatic.  Eyes:     Extraocular Movements: Extraocular movements intact.     Conjunctiva/sclera: Conjunctivae normal.     Pupils: Pupils are equal, round, and reactive to light.  Cardiovascular:     Rate and Rhythm: Normal rate and regular rhythm.     Heart sounds: Normal heart sounds. No murmur.  Pulmonary:     Effort: Pulmonary effort is normal. No respiratory distress.     Breath  sounds: Normal breath sounds.  Abdominal:     General: Bowel sounds are normal. There is no distension.     Palpations: Abdomen is soft.     Tenderness: There is no abdominal tenderness.  Musculoskeletal:     Cervical back: Neck supple.  Skin:    General: Skin is warm and dry.  Neurological:     Mental Status: She is alert and oriented to person, place, and time.     Cranial Nerves: No cranial nerve deficit.     Motor: No abnormal muscle tone.     Deep Tendon Reflexes: Reflexes are normal and symmetric.     Comments: Fluent speech, normal finger-to-nose testing, negative pronator drift, no clonus 5/5 strength and normal sensation x all 4 extremities  Psychiatric:        Thought Content: Thought content normal.        Judgment: Judgment normal.     ED Results / Procedures / Treatments   Labs (all labs ordered are listed, but only abnormal results are displayed) Labs Reviewed  BASIC METABOLIC PANEL - Abnormal; Notable for the following components:      Result Value   Potassium 2.7 (*)    Glucose, Bld 163 (*)    BUN 21 (*)    Calcium 7.9 (*)    All other components within normal limits  MAGNESIUM - Abnormal; Notable for the following components:   Magnesium 1.1 (*)    All other components within normal limits    EKG EKG Interpretation  Date/Time:  Tuesday July 31 2019 18:59:20 EDT Ventricular Rate:  88 PR Interval:    QRS Duration: 102 QT Interval:  366 QTC Calculation: 443 R Axis:   28 Text Interpretation: Sinus rhythm Abnormal R-wave progression, early transition LVH with secondary repolarization abnormality non-specific T wave abnormalities are baseline, no significant change from previous Confirmed by Theotis Burrow [id number]) on 07/31/2019 7:06:42 PM   Radiology No results found.  Procedures Procedures (including critical care time)  Medications Ordered in ED Medications  potassium chloride 10 mEq in 100 mL IVPB (0 mEq Intravenous Stopped 07/31/19 2128)    potassium chloride SA (KLOR-CON) CR tablet 40 mEq (40 mEq Oral Given 07/31/19  1859)  calcium carbonate (TUMS - dosed in mg elemental calcium) chewable tablet 400 mg of elemental calcium (400 mg of elemental calcium Oral Given 07/31/19 1859)  magnesium sulfate IVPB 2 g 50 mL (0 g Intravenous Stopped 07/31/19 2032)    ED Course  I have reviewed the triage vital signs and the nursing notes.  Pertinent labs that were available during my care of the patient were reviewed by me and considered in my medical decision making (see chart for details).    MDM Rules/Calculators/A&P                      Well appearing on exam, normal neuro exam and reassuring VS w/ normal BP.  She has no focal neurologic deficits and given the fact that numbness involves all 4 extremities, highly doubt acute intracranial process such as stroke, mass, or bleed.  Also because symptoms are nonfocal, I doubt acute spinal cord process such as nerve impingement.  Obtain screening BMP to evaluate for electrolyte problem.  K2.7, calcium 7.9, magnesium 1.1.  Gave IV and oral potassium repletion, IV magnesium repletion, and oral calcium repletion.  She does report history of hypokalemia requiring supplementation but was eventually taken off because her potassium level was normal.  Patient reports that her symptoms have improved. I have recommended that she follow-up with PCP for recheck of potassium level and I have reviewed return precautions especially in regards to any focal neurologic deficits.  She voiced understanding. Final Clinical Impression(s) / ED Diagnoses Final diagnoses:  Hypokalemia  Hypomagnesemia  Numbness    Rx / DC Orders ED Discharge Orders         Ordered    potassium chloride SA (KLOR-CON) 20 MEQ tablet  Daily     07/31/19 2132           Con Arganbright, Ambrose Finland, MD 07/31/19 2134

## 2019-07-31 NOTE — ED Triage Notes (Signed)
Pt states that she was seen at her PCP today for numbness starting today. Pt reports she has been unable to sleep r/t her numbness which is in both arms and both legs. Pt had blood work drawn at PCP, will not get results until tomorrow.

## 2019-08-01 ENCOUNTER — Ambulatory Visit: Payer: Medicare Other | Attending: Internal Medicine

## 2019-08-01 DIAGNOSIS — Z23 Encounter for immunization: Secondary | ICD-10-CM

## 2019-08-01 NOTE — Progress Notes (Signed)
   Covid-19 Vaccination Clinic  Name:  [name]    MRN: [id number] DOB: 1970-10-12  08/01/2019  [name] was observed post Covid-19 immunization for 15 minutes without incident. She was provided with Vaccine Information Sheet and instruction to access the V-Safe system.   [name] was instructed to call 911 with any severe reactions post vaccine: Marland Kitchen Difficulty breathing  . Swelling of face and throat  . A fast heartbeat  . A bad rash all over body  . Dizziness and weakness

## 2019-08-07 ENCOUNTER — Other Ambulatory Visit (HOSPITAL_COMMUNITY): Payer: Self-pay | Admitting: Cardiology

## 2019-08-14 ENCOUNTER — Ambulatory Visit: Payer: Medicare Other | Attending: Internal Medicine

## 2019-08-14 DIAGNOSIS — Z23 Encounter for immunization: Secondary | ICD-10-CM

## 2019-08-14 NOTE — Progress Notes (Signed)
   Covid-19 Vaccination Clinic  Name:  [name]    MRN: [id number] DOB: [birth year]  08/14/2019  [name] was observed post Covid-19 immunization for 15 minutes without incident. She was provided with Vaccine Information Sheet and instruction to access the V-Safe system.   [name] was instructed to call 911 with any severe reactions post vaccine: Marland Kitchen Difficulty breathing  . Swelling of face and throat  . A fast heartbeat  . A bad rash all over body  . Dizziness and weakness   Immunizations Administered    Name Date Dose VIS Date Route   Pfizer COVID-19 Vaccine 08/14/2019  1:40 PM 0.3 mL 06/06/2018 Intramuscular   Manufacturer: [employer], [employer]   Lot: [id number]   NDC: [address]-6

## 2019-08-29 ENCOUNTER — Other Ambulatory Visit: Payer: Self-pay | Admitting: Physician Assistant

## 2019-08-29 DIAGNOSIS — R1310 Dysphagia, unspecified: Secondary | ICD-10-CM

## 2019-09-07 ENCOUNTER — Ambulatory Visit
Admission: RE | Admit: 2019-09-07 | Discharge: 2019-09-07 | Disposition: A | Discharge status: Home / Self Care | Payer: Medicare Other | Source: Ambulatory Visit | Attending: Physician Assistant | Admitting: Physician Assistant

## 2019-09-07 DIAGNOSIS — R1310 Dysphagia, unspecified: Secondary | ICD-10-CM

## 2019-09-12 ENCOUNTER — Encounter (HOSPITAL_COMMUNITY): Payer: Self-pay | Admitting: Cardiology

## 2019-09-12 ENCOUNTER — Other Ambulatory Visit: Payer: Self-pay

## 2019-09-12 ENCOUNTER — Ambulatory Visit (HOSPITAL_COMMUNITY)
Admission: RE | Admit: 2019-09-12 | Discharge: 2019-09-12 | Disposition: A | Discharge status: Home / Self Care | Payer: Medicare Other | Source: Ambulatory Visit | Attending: Cardiology | Admitting: Cardiology

## 2019-09-12 VITALS — BP 136/82 | HR 79 | Wt 218.0 lb

## 2019-09-12 DIAGNOSIS — Z803 Family history of malignant neoplasm of breast: Secondary | ICD-10-CM | POA: Insufficient documentation

## 2019-09-12 DIAGNOSIS — I110 Hypertensive heart disease with heart failure: Secondary | ICD-10-CM | POA: Insufficient documentation

## 2019-09-12 DIAGNOSIS — I5022 Chronic systolic (congestive) heart failure: Secondary | ICD-10-CM

## 2019-09-12 DIAGNOSIS — I428 Other cardiomyopathies: Secondary | ICD-10-CM | POA: Insufficient documentation

## 2019-09-12 DIAGNOSIS — M069 Rheumatoid arthritis, unspecified: Secondary | ICD-10-CM | POA: Diagnosis not present

## 2019-09-12 DIAGNOSIS — E119 Type 2 diabetes mellitus without complications: Secondary | ICD-10-CM | POA: Insufficient documentation

## 2019-09-12 DIAGNOSIS — Z79899 Other long term (current) drug therapy: Secondary | ICD-10-CM | POA: Insufficient documentation

## 2019-09-12 DIAGNOSIS — Z7984 Long term (current) use of oral hypoglycemic drugs: Secondary | ICD-10-CM | POA: Diagnosis not present

## 2019-09-12 DIAGNOSIS — Z8249 Family history of ischemic heart disease and other diseases of the circulatory system: Secondary | ICD-10-CM | POA: Insufficient documentation

## 2019-09-12 DIAGNOSIS — Z7952 Long term (current) use of systemic steroids: Secondary | ICD-10-CM | POA: Insufficient documentation

## 2019-09-12 LAB — BASIC METABOLIC PANEL
Anion gap: 12 (ref 5–15)
BUN: 21 mg/dL — ABNORMAL HIGH (ref 6–20)
CO2: 23 mmol/L (ref 22–32)
Calcium: 8.9 mg/dL (ref 8.9–10.3)
Chloride: 105 mmol/L (ref 98–111)
Creatinine, Ser: 0.92 mg/dL (ref 0.44–1.00)
GFR calc Af Amer: >60 mL/min (ref >60)
GFR calc non Af Amer: >60 mL/min (ref >60)
Glucose, Bld: 144 mg/dL — ABNORMAL HIGH (ref 70–99)
Potassium: 3.8 mmol/L (ref 3.5–5.1)
Sodium: 140 mmol/L (ref 135–145)

## 2019-09-12 MED ORDER — FUROSEMIDE 40 MG PO TABS: ORAL_TABLET | ORAL | 5 refills | Status: DC

## 2019-09-12 NOTE — Patient Instructions (Addendum)
A refill for lasix have been sent to your pharmacy   Labs today We will only contact you if something comes back abnormal or we need to make some changes. Otherwise no news is good news!   PLEASE TAKE ALL MEDICATIONS AS SCHEDULED  Your physician recommends that you schedule a follow-up appointment in: 3 months with the Nurse Practioner/Physicians Assistant    Please call office at [phone] option [phone] if you have any questions or concerns.    At the [facility], you and your health needs are our priority. As part of our continuing mission to provide you with exceptional heart care, we have created designated Provider Care Teams. These Care Teams include your primary Cardiologist (physician) and Advanced Practice Providers (APPs- Physician Assistants and Nurse Practitioners) who all work together to provide you with the care you need, when you need it.   You may see any of the following providers on your designated Care Team at your next follow up: Marland Kitchen Dr Arvilla Meres . Dr Marca Ancona . Tonye Becket, NP . Robbie Lis, PA . Karle Plumber, PharmD   Please be sure to bring in all your medications bottles to every appointment.

## 2019-09-12 NOTE — Progress Notes (Signed)
PCP: Dr. Justin Mend Cardiology: Dr. Angelena Form HF Cardiology: Dr. Aundra Dubin  49 y.o. with history of nonischemic cardiomyopathy/chronic systolic CHF and rheumatoid arthritis was referred by Dr. Angelena Form for evaluation/management of CHF.    Patient has had a cardiomyopathy known for a number of years now.  Coronary angiography in 2012 showed no significant disease.  EF has been in the 25-30% range long-term.  Most recent echo in 8/18 showed EF remains 25-30%.  She has no family history of cardiomyopathy and she has never been a heavy drinker.  She has not used illicit drugs.  [place] in 10/18 showed preserved cardiac output with mildly increased filling pressures.  Lasix was increased to 120 qam/80 qpm.  She was seen by EP (Camnitz) and decided against ICD.    Echo in 10/19 showed EF 40-45% with diffuse hypokinesis, normal RV.   Echo today showed EF 40-45%, diffuse hypokinesis.   She returns for followup of CHF.  Weight is down 13 lbs.  She has been doing well symptomatically, no significant exertional dyspnea or chest pain, no orthopnea/PND, no lightheadedness.  She has not been taking her medications as regularly as she should since she has been in and out of the hospital visiting her sick mother.  Main complaint is joint pain from rheumatoid arthritis.   Labs (9/18): K 4.4, creatinine 0.81 Labs (10/18): K 4, creatinine 0.88 Labs (1/19): K 3.9, creatinine 0.66 Labs (10/19): K 3.9, creatinine 0.67 Labs (12/19): creatinine 1.14 Labs (10/20): LDL 93, K 4.3, creatinine 1.15 Labs (4/21): K 2.7, creatinine 0.97  ECG (4/21, personally reviewed): NSR, LVH with repolarization abnormality, narrow QRS.   PMH: 1. Rheumatoid arthritis: Followed by Dr. Lenna Gilford.  2. Type II diabetes.  3. HTN 4. Chronic systolic CHF: Nonischemic cardiomyopathy.  - LHC (2012): No significant CAD.  - Echo (7/13): EF 30-35% - Cardiac MRI (12/14): Mild LV dilation, EF 40%, normal RV size and systolic function, no late gadolinium  enhancement.  - Echo (7/16): EF 25-30% - Echo (8/18): EF 25-30% - RHC (10/18): mean RA 11, PA 47/20 mean 32, mean PCWP 16, CI 3.06 Fick/3.5 thermo, PVR 2.65 WU.  - Echo (10/19): EF 40-45%, diffuse hypokinesis, moderate diastolic dysfunction, normal RV size and systolic function  - Echo (2/21): EF 40-45%, diffuse hypokinesis.   SH: Lives with mother.  Rare ETOH, no drugs.  No smoking.   Family History  Problem Relation Age of Onset  . Heart attack Mother   . CAD Mother   . Allergies Mother   . Breast cancer Mother   . Hypertension Other   . Diabetes Mellitus II Other    ROS: All systems reviewed and negative except as per HPI.   Current Outpatient Medications  Medication Sig Dispense Refill  . albuterol (PROAIR HFA) 108 (90 BASE) MCG/ACT inhaler Inhale 2 puffs into the lungs every 6 (six) hours as needed for wheezing or shortness of breath.    Marland Kitchen albuterol (PROVENTIL) (2.5 MG/3ML) 0.083% nebulizer solution Take 2.5 mg by nebulization every 6 (six) hours as needed for wheezing or shortness of breath.    . betamethasone dipropionate (DIPROLENE) 0.05 % ointment Apply BID every other day to scalp. Avoid face, groin, underarms    . BIDIL 20-37.5 MG tablet TAKE 2 TABLETS BY MOUTH THREE TIMES DAILY 540 tablet 3  . clotrimazole (LOTRIMIN) 1 % cream Apply 1 application topically 2 (two) times daily.    . cyclobenzaprine (FLEXERIL) 10 MG tablet Take 10 mg by mouth at bedtime.    Marland Kitchen  ENTRESTO 97-103 MG Take 1 tablet by mouth twice daily 180 tablet 3  . ferrous gluconate (FERGON) 324 MG tablet Take 324 mg by mouth daily.    . Fluocinolone Acetonide Scalp 0.01 % OIL     . folic acid (FOLVITE) 1 MG tablet Take 1 mg by mouth daily.    . furosemide (LASIX) 40 MG tablet Take 3 tablets (120 mg total) by mouth every morning AND 2 tablets (80 mg total) every evening. 150 tablet 5  . hydroxychloroquine (PLAQUENIL) 200 MG tablet Take 2 tablets (400 mg total) by mouth daily.    Marland Kitchen leflunomide (ARAVA) 20 MG  tablet Take 20 mg by mouth daily.    Marland Kitchen levocetirizine (XYZAL) 5 MG tablet Take 5 mg by mouth at bedtime.     . lidocaine (LIDODERM) 5 % APPLY HALF A PATCH TO AFFECTED AREA AS NEEDED BEFORE INJECTION OF ENBREL    . metFORMIN (GLUCOPHAGE) 500 MG tablet Take 1,000 mg by mouth 2 (two) times daily with a meal.     . methocarbamol (ROBAXIN) 500 MG tablet Take 500 mg by mouth 2 (two) times daily as needed (for muscle spasms.).     Marland Kitchen metoprolol succinate (TOPROL-XL) 100 MG 24 hr tablet Take 1 tablet (100 mg total) by mouth 2 (two) times daily. Take with or immediately following a meal. 60 tablet 3  . omeprazole (PRILOSEC) 40 MG capsule Take 1 capsule (40 mg total) by mouth daily before breakfast. 30 capsule 0  . potassium chloride SA (KLOR-CON) 20 MEQ tablet Take 1 tablet (20 mEq total) by mouth daily. 7 tablet 0  . predniSONE (DELTASONE) 5 MG tablet Take 5 mg by mouth daily.    . simethicone (MYLICON) 80 MG chewable tablet Chew 2 tablets (160 mg total) by mouth 4 (four) times daily as needed for flatulence. 30 tablet 0  . spironolactone (ALDACTONE) 25 MG tablet Take 1 tablet by mouth once daily 90 tablet 3  . SYMBICORT 160-4.5 MCG/ACT inhaler Inhale 2 puffs into the lungs daily as needed (for respiratory issues.).     Marland Kitchen Tocilizumab (ACTEMRA ACTPEN) 162 MG/0.9ML SOAJ Inject into the skin.    Marland Kitchen traMADol (ULTRAM) 50 MG tablet Take 50 mg by mouth every 4 (four) hours as needed for moderate pain or severe pain.    Marland Kitchen triamcinolone cream (KENALOG) 0.1 % Apply 1 application topically 2 (two) times daily as needed (for ezcema.).    Marland Kitchen valACYclovir (VALTREX) 500 MG tablet Take 500 mg by mouth 2 (two) times daily as needed (for cold sores.).     No current facility-administered medications for this encounter.   BP 136/82   Pulse 79   Wt 98.9 kg (218 lb)   SpO2 97%   BMI 41.19 kg/m  General: NAD Neck: No JVD, no thyromegaly or thyroid nodule.  Lungs: Clear to auscultation bilaterally with normal respiratory  effort. CV: Nondisplaced PMI.  Heart regular S1/S2, no S3/S4, no murmur.  No peripheral edema.  No carotid bruit.  Normal pedal pulses.  Abdomen: Soft, nontender, no hepatosplenomegaly, no distention.  Skin: Intact without lesions or rashes.  Neurologic: Alert and oriented x 3.  Psych: Normal affect. Extremities: No clubbing or cyanosis.  HEENT: Normal.   Assessment/Plan: 1. Chronic systolic CHF: Nonischemic cardiomyopathy.  Etiology uncertain, possible prior viral myocarditis.  CMRI in 12/14 with no LGE noted.  No heavy drinking or drugs.  No history of familial cardiomyopathy.  Echo in 2/21 showed stable EF 40-45%.  NYHA class II  symptoms.  She does not appear volume overloaded on exam.  - Continue Lasix 120 qam/80 qpm.  BMET today.  - Continue current spironolactone.  - Continue Toprol XL 100 mg bid.   - Continue Bidil 2 tabs tid.   - Continue Entresto 97/103 bid.    - She has not been taking medications regularly.  I would like her to get back on her meds as ordered, then will try to get her on dapagliflozin in the future.  2. Rheumatoid arthritis: She sees a rheumatologist.   Followup in 3 months with NP/PA, start dapagliflozin at that time if she is taking her meds.     Marca Ancona 09/12/2019

## 2019-11-05 ENCOUNTER — Other Ambulatory Visit: Payer: Self-pay | Admitting: Family Medicine

## 2019-11-05 DIAGNOSIS — R1011 Right upper quadrant pain: Secondary | ICD-10-CM

## 2019-11-08 ENCOUNTER — Ambulatory Visit
Admission: RE | Admit: 2019-11-08 | Discharge: 2019-11-08 | Disposition: A | Discharge status: Home / Self Care | Payer: Medicare Other | Source: Ambulatory Visit | Attending: Family Medicine | Admitting: Family Medicine

## 2019-11-08 DIAGNOSIS — R1011 Right upper quadrant pain: Secondary | ICD-10-CM

## 2019-11-13 ENCOUNTER — Other Ambulatory Visit: Payer: Self-pay | Admitting: Physician Assistant

## 2019-11-14 ENCOUNTER — Other Ambulatory Visit (HOSPITAL_COMMUNITY): Payer: Self-pay | Admitting: Cardiology

## 2019-12-13 ENCOUNTER — Encounter (HOSPITAL_COMMUNITY): Payer: Self-pay

## 2019-12-13 ENCOUNTER — Ambulatory Visit (HOSPITAL_COMMUNITY)
Admission: RE | Admit: 2019-12-13 | Discharge: 2019-12-13 | Disposition: A | Discharge status: Home / Self Care | Payer: Medicare Other | Source: Ambulatory Visit | Attending: Cardiology | Admitting: Cardiology

## 2019-12-13 ENCOUNTER — Other Ambulatory Visit: Payer: Self-pay

## 2019-12-13 VITALS — BP 116/72 | HR 94 | Ht 62.0 in | Wt 215.0 lb

## 2019-12-13 DIAGNOSIS — I110 Hypertensive heart disease with heart failure: Secondary | ICD-10-CM | POA: Insufficient documentation

## 2019-12-13 DIAGNOSIS — Z8249 Family history of ischemic heart disease and other diseases of the circulatory system: Secondary | ICD-10-CM | POA: Diagnosis not present

## 2019-12-13 DIAGNOSIS — I5022 Chronic systolic (congestive) heart failure: Secondary | ICD-10-CM | POA: Diagnosis present

## 2019-12-13 DIAGNOSIS — M069 Rheumatoid arthritis, unspecified: Secondary | ICD-10-CM | POA: Diagnosis not present

## 2019-12-13 DIAGNOSIS — I428 Other cardiomyopathies: Secondary | ICD-10-CM | POA: Diagnosis not present

## 2019-12-13 DIAGNOSIS — Z79899 Other long term (current) drug therapy: Secondary | ICD-10-CM | POA: Diagnosis not present

## 2019-12-13 DIAGNOSIS — Z7984 Long term (current) use of oral hypoglycemic drugs: Secondary | ICD-10-CM | POA: Insufficient documentation

## 2019-12-13 DIAGNOSIS — E119 Type 2 diabetes mellitus without complications: Secondary | ICD-10-CM | POA: Insufficient documentation

## 2019-12-13 LAB — CBC
HCT: 31.7 % — ABNORMAL LOW (ref 36.0–46.0)
Hemoglobin: 9.8 g/dL — ABNORMAL LOW (ref 12.0–15.0)
MCH: 28.3 pg (ref 26.0–34.0)
MCHC: 30.9 g/dL (ref 30.0–36.0)
MCV: 91.6 fL (ref 80.0–100.0)
Platelets: 430 10*3/uL — ABNORMAL HIGH (ref 150–400)
RBC: 3.46 MIL/uL — ABNORMAL LOW (ref 3.87–5.11)
RDW: 12.9 % (ref 11.5–15.5)
WBC: 7.9 10*3/uL (ref 4.0–10.5)
nRBC: 0.3 % — ABNORMAL HIGH (ref 0.0–0.2)

## 2019-12-13 LAB — BASIC METABOLIC PANEL
Anion gap: 12 (ref 5–15)
BUN: 24 mg/dL — ABNORMAL HIGH (ref 6–20)
CO2: 22 mmol/L (ref 22–32)
Calcium: 8.8 mg/dL — ABNORMAL LOW (ref 8.9–10.3)
Chloride: 102 mmol/L (ref 98–111)
Creatinine, Ser: 1.11 mg/dL — ABNORMAL HIGH (ref 0.44–1.00)
GFR calc Af Amer: >60 mL/min (ref >60)
GFR calc non Af Amer: 59 mL/min — ABNORMAL LOW (ref >60)
Glucose, Bld: 121 mg/dL — ABNORMAL HIGH (ref 70–99)
Potassium: 3.4 mmol/L — ABNORMAL LOW (ref 3.5–5.1)
Sodium: 136 mmol/L (ref 135–145)

## 2019-12-13 LAB — BRAIN NATRIURETIC PEPTIDE: B Natriuretic Peptide: 23.7 pg/mL (ref 0.0–100.0)

## 2019-12-13 LAB — HEMOGLOBIN A1C
Hgb A1c MFr Bld: 6.8 % — ABNORMAL HIGH (ref 4.8–5.6)
Mean Plasma Glucose: 148.46 mg/dL

## 2019-12-13 MED ORDER — DAPAGLIFLOZIN PROPANEDIOL 10 MG PO TABS: 10.0000 mg | ORAL_TABLET | Freq: Every day | ORAL | 11 refills | Status: DC

## 2019-12-13 NOTE — Patient Instructions (Addendum)
START Farxiga 10 mg, one tab daily  Labs today We will only contact you if something comes back abnormal or we need to make some changes. Otherwise no news is good news!  Your physician recommends that you schedule a follow-up appointment in: 3 weeks with the Pharmacy Team  Your physician recommends that you schedule a follow-up appointment in: 3-4 months with Dr Gala Romney  Do the following things EVERYDAY: 1) Weigh yourself in the morning before breakfast. Write it down and keep it in a log. 2) Take your medicines as prescribed 3) Eat low salt foods--Limit salt (sodium) to 2000 mg per day.  4) Stay as active as you can everyday 5) Limit all fluids for the day to less than 2 liters   If you have any questions or concerns before your next appointment please send [place] a message through [place] or call our office at [phone].    TO LEAVE A MESSAGE FOR THE NURSE SELECT OPTION 2, PLEASE LEAVE A MESSAGE INCLUDING: . YOUR NAME . DATE OF BIRTH . CALL BACK NUMBER . REASON FOR CALL**this is important as we prioritize the call backs  YOU WILL RECEIVE A CALL BACK THE SAME DAY AS LONG AS YOU CALL BEFORE 4:00 PM

## 2019-12-13 NOTE — Progress Notes (Signed)
PCP: Dr. Hyman Hopes Cardiology: Dr. Clifton James HF Cardiology: Dr. Shirlee Latch  Reason for visit: Follow-up for chronic systolic heart failure  49 y.o. with history of nonischemic cardiomyopathy/chronic systolic CHF and rheumatoid arthritis was referred by Dr. Clifton James for evaluation/management of CHF.    Patient has had a cardiomyopathy known for a number of years now.  Coronary angiography in 2012 showed no significant disease.  EF has been in the 25-30% range long-term.  Most recent echo in 8/18 showed EF remains 25-30%.  She has no family history of cardiomyopathy and she has never been a heavy drinker.  She has not used illicit drugs.  RHC in 10/18 showed preserved cardiac output with mildly increased filling pressures.  Lasix was increased to 120 qam/80 qpm.  She was seen by EP (Camnitz) and decided against ICD.    Echo in 10/19 showed EF 40-45% with diffuse hypokinesis, normal RV.   Echo 2/21 showed EF 40-45%, diffuse hypokinesis.   She presents to clinic today for routine follow-up for chronic systolic heart failure. Doing well symptomatically, NYHA class II. Reports improved compliance with medications. Now taken daily. Blood pressure is well controlled, 116/72 in clinic today. Her weight has been stable, down 3 pounds from previous clinic visit. She denies orthopnea, PND. No lower extremity edema. No chest pain  Labs (9/18): K 4.4, creatinine 0.81 Labs (10/18): K 4, creatinine 0.88 Labs (1/19): K 3.9, creatinine 0.66 Labs (10/19): K 3.9, creatinine 0.67 Labs (12/19): creatinine 1.14 Labs (10/20): LDL 93, K 4.3, creatinine 3.41 Labs (4/21): K 2.7, creatinine 0.97 Labs (6/21): K 3.8, creatinine 0.92  ECG : Not performed today  PMH: 1. Rheumatoid arthritis: Followed by Dr. Lendon Colonel.  2. Type II diabetes.  3. HTN 4. Chronic systolic CHF: Nonischemic cardiomyopathy.  - LHC (2012): No significant CAD.  - Echo (7/13): EF 30-35% - Cardiac MRI (12/14): Mild LV dilation, EF 40%, normal RV size and  systolic function, no late gadolinium enhancement.  - Echo (7/16): EF 25-30% - Echo (8/18): EF 25-30% - RHC (10/18): mean RA 11, PA 47/20 mean 32, mean PCWP 16, CI 3.06 Fick/3.5 thermo, PVR 2.65 WU.  - Echo (10/19): EF 40-45%, diffuse hypokinesis, moderate diastolic dysfunction, normal RV size and systolic function  - Echo (2/21): EF 40-45%, diffuse hypokinesis.   SH: Lives with mother.  Rare ETOH, no drugs.  No smoking.   Family History  Problem Relation Age of Onset  . Heart attack Mother   . CAD Mother   . Allergies Mother   . Breast cancer Mother   . Hypertension Other   . Diabetes Mellitus II Other    ROS: All systems reviewed and negative except as per HPI.   Current Outpatient Medications  Medication Sig Dispense Refill  . albuterol (PROAIR HFA) 108 (90 BASE) MCG/ACT inhaler Inhale 2 puffs into the lungs every 6 (six) hours as needed for wheezing or shortness of breath.    Marland Kitchen albuterol (PROVENTIL) (2.5 MG/3ML) 0.083% nebulizer solution Take 2.5 mg by nebulization every 6 (six) hours as needed for wheezing or shortness of breath.    . betamethasone dipropionate (DIPROLENE) 0.05 % ointment Apply BID every other day to scalp. Avoid face, groin, underarms    . BIDIL 20-37.5 MG tablet TAKE 2 TABLETS BY MOUTH THREE TIMES DAILY 540 tablet 3  . clotrimazole (LOTRIMIN) 1 % cream Apply 1 application topically 2 (two) times daily.    . cyclobenzaprine (FLEXERIL) 10 MG tablet Take 10 mg by mouth at bedtime.    Marland Kitchen  ENTRESTO 97-103 MG Take 1 tablet by mouth twice daily 180 tablet 3  . ferrous gluconate (FERGON) 324 MG tablet Take 324 mg by mouth daily.    . Fluocinolone Acetonide Scalp 0.01 % OIL     . folic acid (FOLVITE) 1 MG tablet Take 1 mg by mouth daily.    . furosemide (LASIX) 40 MG tablet Take 3 tablets (120 mg total) by mouth every morning AND 2 tablets (80 mg total) every evening. 150 tablet 5  . hydroxychloroquine (PLAQUENIL) 200 MG tablet Take 2 tablets (400 mg total) by mouth  daily.    Marland Kitchen leflunomide (ARAVA) 20 MG tablet Take 20 mg by mouth daily.    Marland Kitchen levocetirizine (XYZAL) 5 MG tablet Take 5 mg by mouth at bedtime.     . lidocaine (LIDODERM) 5 % APPLY HALF A PATCH TO AFFECTED AREA AS NEEDED BEFORE INJECTION OF ENBREL    . metFORMIN (GLUCOPHAGE) 500 MG tablet Take 1,000 mg by mouth 2 (two) times daily with a meal.     . methocarbamol (ROBAXIN) 500 MG tablet Take 500 mg by mouth 2 (two) times daily as needed (for muscle spasms.).     Marland Kitchen metoprolol succinate (TOPROL-XL) 100 MG 24 hr tablet TAKE 1 TABLET BY MOUTH TWICE DAILY TAKE  WITH  OR  IMMEDIATELY  FOLLOWING  A  MEAL 180 tablet 3  . omeprazole (PRILOSEC) 40 MG capsule Take 1 capsule (40 mg total) by mouth daily before breakfast. 30 capsule 0  . potassium chloride SA (KLOR-CON) 20 MEQ tablet Take 1 tablet (20 mEq total) by mouth daily. 7 tablet 0  . predniSONE (DELTASONE) 5 MG tablet Take 5 mg by mouth daily.    . simethicone (MYLICON) 80 MG chewable tablet Chew 2 tablets (160 mg total) by mouth 4 (four) times daily as needed for flatulence. 30 tablet 0  . spironolactone (ALDACTONE) 25 MG tablet Take 1 tablet by mouth once daily 90 tablet 3  . SYMBICORT 160-4.5 MCG/ACT inhaler Inhale 2 puffs into the lungs daily as needed (for respiratory issues.).     Marland Kitchen Tocilizumab (ACTEMRA ACTPEN) 162 MG/0.9ML SOAJ Inject into the skin.    Marland Kitchen traMADol (ULTRAM) 50 MG tablet Take 50 mg by mouth every 4 (four) hours as needed for moderate pain or severe pain.    Marland Kitchen triamcinolone cream (KENALOG) 0.1 % Apply 1 application topically 2 (two) times daily as needed (for ezcema.).    Marland Kitchen valACYclovir (VALTREX) 500 MG tablet Take 500 mg by mouth 2 (two) times daily as needed (for cold sores.).     No current facility-administered medications for this encounter.   BP 116/72   Pulse 94   Ht 5\' 2"  (1.575 m)   Wt 97.5 kg (215 lb)   SpO2 98%   BMI 39.32 kg/m    PHYSICAL EXAM: General:  Well appearing, moderately obese. No respiratory  difficulty HEENT: normal Neck: supple. no JVD. Carotids 2+ bilat; no bruits. No lymphadenopathy or thyromegaly appreciated. Cor: PMI nondisplaced. Regular rate & rhythm. No rubs, gallops or murmurs. Lungs: clear Abdomen: soft, nontender, nondistended. No hepatosplenomegaly. No bruits or masses. Good bowel sounds. Extremities: no cyanosis, clubbing, rash, edema Neuro: alert & oriented x 3, cranial nerves grossly intact. moves all 4 extremities w/o difficulty. Affect pleasant.    Assessment/Plan: 1. Chronic systolic CHF: Nonischemic cardiomyopathy.  Etiology uncertain, possible prior viral myocarditis.  CMRI in 12/14 with no LGE noted.  No heavy drinking or drugs.  No history of familial cardiomyopathy.  Echo in 2/21 showed stable EF 40-45%.   -Stable NYHA class II symptoms. - Euvolemic on physical exam - Continue Lasix 120 qam/80 qpm.   - Continue spironolactone 25 mg daily  - Continue Toprol XL 100 mg bid.   - Continue Bidil 2 tabs tid.   - Continue Entresto 97/103 bid.    -Start dapagliflozin 10 mg daily. She was advised to closely monitor weight and BP after initiation. If development of hypovolemia can reduce Lasix dosing. -Check BMP today and again in 7 days  2. Rheumatoid arthritis:  -Followed by rheumatology -Pain controlled  3. Type 2 diabetes -On Metformin 1000 mg twice daily -Check hemoglobin A1c today -We will start SGLT2i inhibitor, dapagliflozin 10 mg  Follow-up for repeat BMP in 7 days. Follow-up with pharmD in 3-4 weeks. Follow-up with Dr. Jearld Pies in 3 months  Robbie Lis, PA-C 12/13/2019

## 2019-12-21 ENCOUNTER — Encounter (HOSPITAL_COMMUNITY): Payer: Self-pay | Admitting: Cardiology

## 2019-12-28 NOTE — Progress Notes (Signed)
PCP: Dr. Hyman Hopes Cardiology: Dr. Clifton James HF Cardiology: Dr. Shirlee Latch  HPI:  49 y.o. with history of nonischemic cardiomyopathy/chronic systolic CHF and rheumatoid arthritis was referred by Dr. Clifton James for evaluation/management of CHF.    Patient has had a cardiomyopathy known for a number of years now.  Coronary angiography in 2012 showed no significant disease.  EF has been in the 25-30% range long-term.  Most recent echo in 8/18 showed EF remains 25-30%.  She has no family history of cardiomyopathy and she has never been a heavy drinker.  She has not used illicit drugs.  RHC in 10/18 showed preserved cardiac output with mildly increased filling pressures.  Lasix was increased to 120 qam/80 qpm.  She was seen by EP (Camnitz) and decided against ICD.    Echo in 10/19 showed EF 40-45% with diffuse hypokinesis, normal RV.   Echo 2/21 showed EF 40-45%, diffuse hypokinesis.   She recently presented to HF clinic for routine follow-up for chronic systolic heart failure on 12/13/19. Reported doing well symptomatically, NYHA class II. Reported improved compliance with medications. Now taking daily. Blood pressure was well controlled, 116/72 in clinic today. Her weight had been stable, down 3 pounds from previous clinic visit. She denied orthopnea, PND. No lower extremity edema. No chest pain  Today she returns to HF clinic for pharmacist medication titration. At last visit with APP, dapagliflozin 10 mg daily was initiated. Overall she is feeling well today. Main complaints are sciatica and migraines. No dizziness, lightheadedness, chest pain or palpitations. She takes furosemide 120 mg every AM and 80 mg every PM and has not needed any extra. No LEE, PND or orthopnea. Taking all medications as prescribed and tolerating all medications.    HF Medications: Metoprolol succinate 100 mg BID Entresto 97/103 mg BID Spironolactone 25 mg daily Bidil 20-37.5 mg 2 tablets TID Dapagliflozin 10 mg  daily Furosemide 120 mg AM/80 mg PM  Has the patient been experiencing any side effects to the medications prescribed?  no  Does the patient have any problems obtaining medications due to transportation or finances?   No - has [facility] Medicare  Understanding of regimen: good Understanding of indications: good Potential of compliance: good Patient understands to avoid NSAIDs. Patient understands to avoid decongestants.    Pertinent Lab Values (01/02/2020): Marland Kitchen Serum creatinine 0.86, BUN 20, Potassium 4.2, Sodium 138  Vital Signs: . Weight: 212 lbs (last clinic weight: 216 lbs) . Blood pressure: 108/60  . Heart rate: 89   Assessment: 1. Chronic systolic CHF: Nonischemic cardiomyopathy.  Etiology uncertain, possible prior viral myocarditis.  CMRI in 12/14 with no LGE noted.  No heavy drinking or drugs.  No history of familial cardiomyopathy.  Echo in 2/21 showed stable EF 40-45%.   -Stable NYHA class II symptoms. Euvolemic on physical exam. On excellent GDMT.  - Continue furosemide 120 qam/80 qpm. - Continue metoprolol succinate 100 mg BID.    - Continue Entresto 97/103 mg BID.  - Continue spironolactone 25 mg daily   - Continue Bidil 2 tabs TID.     - Continue dapagliflozin 10 mg daily.   2. Rheumatoid arthritis:  -Followed by rheumatology -Pain controlled  3. Type 2 diabetes -Continue metformin and dapagliflozin 10 mg daily -hemoglobin A1c 6.8% (12/13/19)   Plan: 1) Medication changes: Based on clinical presentation, vital signs and recent labs will make no medication changes today.  2) Follow-up: 2 months with Dr. Jonn Shingles, PharmD, BCPS, [facility], CPP Heart Failure Clinic Pharmacist [phone]

## 2020-01-02 ENCOUNTER — Other Ambulatory Visit: Payer: Self-pay

## 2020-01-02 ENCOUNTER — Emergency Department (HOSPITAL_BASED_OUTPATIENT_CLINIC_OR_DEPARTMENT_OTHER)
Admission: EM | Admit: 2020-01-02 | Discharge: 2020-01-02 | Disposition: A | Discharge status: Home / Self Care | Payer: Medicare Other | Attending: Emergency Medicine | Admitting: Emergency Medicine

## 2020-01-02 ENCOUNTER — Encounter (HOSPITAL_BASED_OUTPATIENT_CLINIC_OR_DEPARTMENT_OTHER): Payer: Self-pay | Admitting: *Deleted

## 2020-01-02 DIAGNOSIS — J45909 Unspecified asthma, uncomplicated: Secondary | ICD-10-CM | POA: Diagnosis not present

## 2020-01-02 DIAGNOSIS — R519 Headache, unspecified: Secondary | ICD-10-CM | POA: Diagnosis present

## 2020-01-02 DIAGNOSIS — M5442 Lumbago with sciatica, left side: Secondary | ICD-10-CM | POA: Diagnosis not present

## 2020-01-02 DIAGNOSIS — Z20822 Contact with and (suspected) exposure to covid-19: Secondary | ICD-10-CM | POA: Diagnosis not present

## 2020-01-02 DIAGNOSIS — M5441 Lumbago with sciatica, right side: Secondary | ICD-10-CM

## 2020-01-02 DIAGNOSIS — Z7984 Long term (current) use of oral hypoglycemic drugs: Secondary | ICD-10-CM | POA: Diagnosis not present

## 2020-01-02 DIAGNOSIS — Z79899 Other long term (current) drug therapy: Secondary | ICD-10-CM | POA: Diagnosis not present

## 2020-01-02 DIAGNOSIS — I5022 Chronic systolic (congestive) heart failure: Secondary | ICD-10-CM | POA: Diagnosis not present

## 2020-01-02 DIAGNOSIS — E119 Type 2 diabetes mellitus without complications: Secondary | ICD-10-CM | POA: Diagnosis not present

## 2020-01-02 DIAGNOSIS — I110 Hypertensive heart disease with heart failure: Secondary | ICD-10-CM | POA: Insufficient documentation

## 2020-01-02 DIAGNOSIS — R110 Nausea: Secondary | ICD-10-CM | POA: Insufficient documentation

## 2020-01-02 DIAGNOSIS — H53149 Visual discomfort, unspecified: Secondary | ICD-10-CM | POA: Insufficient documentation

## 2020-01-02 LAB — CBC WITH DIFFERENTIAL/PLATELET
Abs Immature Granulocytes: 0.05 10*3/uL (ref 0.00–0.07)
Basophils Absolute: 0 10*3/uL (ref 0.0–0.1)
Basophils Relative: 0 %
Eosinophils Absolute: 0.1 10*3/uL (ref 0.0–0.5)
Eosinophils Relative: 1 %
HCT: 27.1 % — ABNORMAL LOW (ref 36.0–46.0)
Hemoglobin: 8.6 g/dL — ABNORMAL LOW (ref 12.0–15.0)
Immature Granulocytes: 1 %
Lymphocytes Relative: 16 %
Lymphs Abs: 1.7 10*3/uL (ref 0.7–4.0)
MCH: 28.5 pg (ref 26.0–34.0)
MCHC: 31.7 g/dL (ref 30.0–36.0)
MCV: 89.7 fL (ref 80.0–100.0)
Monocytes Absolute: 0.9 10*3/uL (ref 0.1–1.0)
Monocytes Relative: 9 %
Neutro Abs: 7.9 10*3/uL — ABNORMAL HIGH (ref 1.7–7.7)
Neutrophils Relative %: 73 %
Platelets: 300 10*3/uL (ref 150–400)
RBC: 3.02 MIL/uL — ABNORMAL LOW (ref 3.87–5.11)
RDW: 13.4 % (ref 11.5–15.5)
WBC: 10.7 10*3/uL — ABNORMAL HIGH (ref 4.0–10.5)
nRBC: 0 % (ref 0.0–0.2)

## 2020-01-02 LAB — BASIC METABOLIC PANEL
Anion gap: 10 (ref 5–15)
BUN: 20 mg/dL (ref 6–20)
CO2: 23 mmol/L (ref 22–32)
Calcium: 8.8 mg/dL — ABNORMAL LOW (ref 8.9–10.3)
Chloride: 105 mmol/L (ref 98–111)
Creatinine, Ser: 0.86 mg/dL (ref 0.44–1.00)
GFR calc Af Amer: >60 mL/min (ref >60)
GFR calc non Af Amer: >60 mL/min (ref >60)
Glucose, Bld: 124 mg/dL — ABNORMAL HIGH (ref 70–99)
Potassium: 4.2 mmol/L (ref 3.5–5.1)
Sodium: 138 mmol/L (ref 135–145)

## 2020-01-02 LAB — URINALYSIS, ROUTINE W REFLEX MICROSCOPIC
Bilirubin Urine: NEGATIVE
Glucose, UA: >=500 mg/dL — AB
Hgb urine dipstick: NEGATIVE
Ketones, ur: NEGATIVE mg/dL
Leukocytes,Ua: NEGATIVE
Nitrite: NEGATIVE
Protein, ur: NEGATIVE mg/dL
Specific Gravity, Urine: 1.015 (ref 1.005–1.030)
pH: 5.5 (ref 5.0–8.0)

## 2020-01-02 LAB — URINALYSIS, MICROSCOPIC (REFLEX)
Bacteria, UA: NONE SEEN
RBC / HPF: NONE SEEN RBC/hpf (ref 0–5)

## 2020-01-02 LAB — PREGNANCY, URINE: Preg Test, Ur: NEGATIVE

## 2020-01-02 LAB — SARS CORONAVIRUS 2 BY RT PCR (HOSPITAL ORDER, PERFORMED IN ~~LOC~~ HOSPITAL LAB): SARS Coronavirus 2: NEGATIVE

## 2020-01-02 MED ORDER — METOCLOPRAMIDE HCL 5 MG/ML IJ SOLN
10.0000 mg | Freq: Once | INTRAMUSCULAR | Status: AC
  Administered 2020-01-02: 10 mg via INTRAVENOUS
  Filled 2020-01-02: qty 2

## 2020-01-02 MED ORDER — OXYCODONE-ACETAMINOPHEN 5-325 MG PO TABS: 1.0000 | ORAL_TABLET | Freq: Four times a day (QID) | ORAL | 0 refills | Status: DC | PRN

## 2020-01-02 MED ORDER — DIPHENHYDRAMINE HCL 50 MG/ML IJ SOLN
25.0000 mg | Freq: Once | INTRAMUSCULAR | Status: AC
  Administered 2020-01-02: 25 mg via INTRAVENOUS
  Filled 2020-01-02: qty 1

## 2020-01-02 MED ORDER — DEXAMETHASONE SODIUM PHOSPHATE 10 MG/ML IJ SOLN
10.0000 mg | Freq: Once | INTRAMUSCULAR | Status: AC
  Administered 2020-01-02: 10 mg via INTRAVENOUS
  Filled 2020-01-02: qty 1

## 2020-01-02 NOTE — ED Notes (Signed)
Attempted IV times 1 with [place]. Charge RN to try for IV access.

## 2020-01-02 NOTE — ED Notes (Signed)
ED Provider at bedside. 

## 2020-01-02 NOTE — ED Triage Notes (Addendum)
C/o ha and vomiting  x 2 days, also c/o lower back pain x 2 weeks tylenol PTA

## 2020-01-02 NOTE — ED Provider Notes (Signed)
[facility] EMERGENCY DEPARTMENT Provider Note   CSN: [id number] Arrival date & time: 01/02/20  1342     History Chief Complaint  Patient presents with  . Headache    [name] is a 49 y.o. female.  Patient is a 49 year old female with a history of diabetes, hypertension, rheumatoid arthritis and lupus who presents with headache.  She says she has had a headache for 2 days.  She says it feels like a sinus headache.  Is in the front part of her head.  It radiates up toward the middle of her head.  There is no radiation to the back of her head.  No neck pain or stiffness.  She reported a fever this morning of 100.8.  She took some Tylenol and feels little bit better.  She says it feels kind of like a migraine.  She has had migraines before but not in a few years.  She said it started gradually yesterday and has been getting worse today.  She has had some photophobia and nausea but no vomiting.  She does have some low back pain.  Its in the middle of her low back.  At times radiates down her legs bilaterally.  She denies any numbness or weakness in her legs.  No loss of bowel or bladder control.  She said that she has arthritis in her back pain flares up from time to time.  She says its been going on about 2 weeks and feels similar to her prior episodes of back pain.  She denies any recent injuries.  Currently she does not have any radicular symptoms.  She denies any urinary symptoms.  No abdominal pain.  No change in bowel habits.  No runny nose or nasal congestion.  No cough or chest congestion.  No shortness of breath.  No myalgias or increased weakness.        Past Medical History:  Diagnosis Date  . Allergy   . Asthma   . Cardiomyopathy    Non-ischemic  . Diabetes mellitus without complication (HCC)   . Herpes simplex of female genitalia   . Hypertension   . Rheumatoid arthritis(714.0)     Patient Active Problem List   Diagnosis Date Noted  . Diverticulitis of  large intestine with abscess without bleeding   . Perforation of sigmoid colon due to diverticulitis 03/18/2018  . Abscess of sigmoid colon due to diverticulitis s/p perc drainage 03/15/2018 03/14/2018  . Chronic systolic heart failure (HCC) 05/20/2017  . Alopecia areata 08/26/2016  . Palpitations 10/31/2014  . Non-ischemic cardiomyopathy (HCC) 10/31/2014  . Pulmonary infiltrates 08/01/2013  . Cough 08/01/2013  . 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 Rheumatoid arthritis (HCC) 05/24/2012  . Hypertension 03/03/2012  . GERD (gastroesophageal reflux disease) 03/03/2012  . Morbid obesity,BMI of 45.0-49.9, adult 03/03/2012  . Rheumatoid arthritis(714.0) 03/03/2012  . Diabetes mellitus, type 2 (HCC) 03/03/2012  . Hypokalemia 03/03/2012  . Lupus (HCC) 03/02/2012  . ASTHMA 06/04/2010    Past Surgical History:  Procedure Laterality Date  . DENTAL SURGERY    . OVARY SURGERY    . RIGHT HEART CATH N/A 01/18/2017   Procedure: RIGHT HEART CATH;  Surgeon: Laurey Morale, MD;  Location: [facility] INVASIVE CV LAB;  Service: Cardiovascular;  Laterality: N/A;     OB History   No obstetric history on file.     Family History  Problem Relation Age of Onset  . Heart  attack Mother   . CAD Mother   . Allergies Mother   . Breast cancer Mother   . Hypertension Other   . Diabetes Mellitus II Other     Social History   Tobacco Use  . Smoking status: Never Smoker  . Smokeless tobacco: Never Used  Vaping Use  . Vaping Use: Never used  Substance Use Topics  . Alcohol use: No  . Drug use: No    Home Medications Prior to Admission medications   Medication Sig Start Date End Date Taking? Authorizing Provider  acetaminophen-codeine (TYLENOL #3) 300-30 MG tablet Take by mouth every 8 (eight) hours as needed for moderate pain.   Yes [provider]  betamethasone dipropionate (DIPROLENE) 0.05 % ointment Apply BID every other  day to scalp. Avoid face, groin, underarms 05/03/19  Yes [provider]  BIDIL 20-37.5 MG tablet TAKE 2 TABLETS BY MOUTH THREE TIMES DAILY 08/08/19  Yes Laurey Morale, MD  dapagliflozin propanediol (FARXIGA) 10 MG TABS tablet Take 1 tablet (10 mg total) by mouth daily before breakfast. 12/13/19  Yes Allayne Butcher, PA-C  ENTRESTO 97-103 MG Take 1 tablet by mouth twice daily 08/08/19  Yes Laurey Morale, MD  ferrous gluconate (FERGON) 324 MG tablet Take 324 mg by mouth daily.   Yes [provider]  folic acid (FOLVITE) 1 MG tablet Take 1 mg by mouth daily.   Yes [provider]  furosemide (LASIX) 40 MG tablet Take 3 tablets (120 mg total) by mouth every morning AND 2 tablets (80 mg total) every evening. 09/12/19  Yes Laurey Morale, MD  hydroxychloroquine (PLAQUENIL) 200 MG tablet Take 2 tablets (400 mg total) by mouth daily. 04/06/18  Yes Rodolph Bong, MD  leflunomide (ARAVA) 20 MG tablet Take 20 mg by mouth daily. 03/23/19  Yes [provider]  levocetirizine (XYZAL) 5 MG tablet Take 5 mg by mouth at bedtime.    Yes [provider]  metFORMIN (GLUCOPHAGE) 500 MG tablet Take 1,000 mg by mouth 2 (two) times daily with a meal.    Yes [provider]  methocarbamol (ROBAXIN) 500 MG tablet Take 500 mg by mouth 2 (two) times daily as needed (for muscle spasms.).    Yes [provider]  metoprolol succinate (TOPROL-XL) 100 MG 24 hr tablet TAKE 1 TABLET BY MOUTH TWICE DAILY TAKE  WITH  OR  IMMEDIATELY  FOLLOWING  A  MEAL 11/15/19  Yes Laurey Morale, MD  omeprazole (PRILOSEC) 40 MG capsule Take 1 capsule (40 mg total) by mouth daily before breakfast. 03/28/18  Yes Rodolph Bong, MD  potassium chloride SA (KLOR-CON) 20 MEQ tablet Take 1 tablet (20 mEq total) by mouth daily. 07/31/19  Yes Little, Ambrose Finland, MD  spironolactone (ALDACTONE) 25 MG tablet Take 1 tablet by mouth once daily 07/31/19  Yes Laurey Morale, MD    valACYclovir (VALTREX) 500 MG tablet Take 500 mg by mouth 2 (two) times daily as needed (for cold sores.).   Yes [provider]  albuterol (PROAIR HFA) 108 (90 BASE) MCG/ACT inhaler Inhale 2 puffs into the lungs every 6 (six) hours as needed for wheezing or shortness of breath.    [provider]  albuterol (PROVENTIL) (2.5 MG/3ML) 0.083% nebulizer solution Take 2.5 mg by nebulization every 6 (six) hours as needed for wheezing or shortness of breath.    [provider]  clotrimazole (LOTRIMIN) 1 % cream Apply 1 application topically 2 (two) times daily.  03/13/18   [provider]  Fluocinolone Acetonide Scalp 0.01 % OIL  04/26/19   [provider]  oxyCODONE-acetaminophen (PERCOCET/ROXICET) 5-325 MG tablet Take 1-2 tablets by mouth every 6 (six) hours as needed for severe pain. 01/02/20   Rolan Bucco, MD  predniSONE (DELTASONE) 5 MG tablet Take 5 mg by mouth daily. 03/23/19   [provider]  SYMBICORT 160-4.5 MCG/ACT inhaler Inhale 2 puffs into the lungs daily as needed (for respiratory issues.).  06/01/13   [provider]  traMADol (ULTRAM) 50 MG tablet Take 50 mg by mouth every 4 (four) hours as needed for moderate pain or severe pain.    [provider]    Allergies    Clarithromycin, Hydrocodone, Ondansetron, Penicillins, and Tdap [tetanus-diphth-acell pertussis]  Review of Systems   Review of Systems  Constitutional: Positive for fever. Negative for chills, diaphoresis and fatigue.  HENT: Negative for congestion, rhinorrhea and sneezing.   Eyes: Positive for photophobia.  Respiratory: Negative for cough, chest tightness and shortness of breath.   Cardiovascular: Negative for chest pain and leg swelling.  Gastrointestinal: Positive for nausea. Negative for abdominal pain, blood in stool, diarrhea and vomiting.  Genitourinary: Negative for difficulty urinating, flank pain, frequency and hematuria.  Musculoskeletal:  Positive for back pain. Negative for arthralgias.  Skin: Negative for rash.  Neurological: Positive for headaches. Negative for dizziness, speech difficulty, weakness and numbness.    Physical Exam Updated Vital Signs BP 121/78 (BP Location: Right Arm)   Pulse 78   Temp 99 F (37.2 C) (Oral)   Resp 20   Ht 5\' 2"  (1.575 m)   Wt 98 kg   LMP 08/10/2019 (Exact Date)   SpO2 99%   BMI 39.51 kg/m   Physical Exam Constitutional:      Appearance: She is well-developed.  HENT:     Head: Normocephalic and atraumatic.  Eyes:     Extraocular Movements: Extraocular movements intact.     Pupils: Pupils are equal, round, and reactive to light.     Comments: Mild photophobia  Neck:     Comments: No meningismus Cardiovascular:     Rate and Rhythm: Normal rate and regular rhythm.     Heart sounds: Normal heart sounds.  Pulmonary:     Effort: Pulmonary effort is normal. No respiratory distress.     Breath sounds: Normal breath sounds. No wheezing or rales.  Chest:     Chest wall: No tenderness.  Abdominal:     General: Bowel sounds are normal.     Palpations: Abdomen is soft.     Tenderness: There is no abdominal tenderness. There is no guarding or rebound.  Musculoskeletal:        General: Normal range of motion.     Cervical back: Normal range of motion and neck supple.     Comments: Mild tenderness over the L4-L5 area.  No step-offs or deformities.  Negative straight leg raise bilaterally.  She has normal motor function and sensation to the lower extremities bilaterally.  Lymphadenopathy:     Cervical: No cervical adenopathy.  Skin:    General: Skin is warm and dry.     Findings: No rash.  Neurological:     Mental Status: She is alert and oriented to person, place, and time.     Comments: Motor 5/5 all extremities Sensation grossly intact to LT all extremities Finger to Nose intact, no pronator drift CN II-XII grossly intact       ED Results /  Procedures / Treatments     Labs (all labs ordered are listed, but only abnormal results are displayed) Labs Reviewed  URINALYSIS, ROUTINE W REFLEX MICROSCOPIC - Abnormal; Notable for the following components:      Result Value   Glucose, UA >=500 (*)    All other components within normal limits  BASIC METABOLIC PANEL - Abnormal; Notable for the following components:   Glucose, Bld 124 (*)    Calcium 8.8 (*)    All other components within normal limits  CBC WITH DIFFERENTIAL/PLATELET - Abnormal; Notable for the following components:   WBC 10.7 (*)    RBC 3.02 (*)    Hemoglobin 8.6 (*)    HCT 27.1 (*)    Neutro Abs 7.9 (*)    All other components within normal limits  SARS CORONAVIRUS 2 BY RT PCR (HOSPITAL ORDER, PERFORMED IN [place] [facility])  URINALYSIS, MICROSCOPIC (REFLEX)  PREGNANCY, URINE    EKG None  Radiology No results found.  Procedures Procedures (including critical care time)  Medications Ordered in ED Medications  metoCLOPramide (REGLAN) injection 10 mg (10 mg Intravenous Given 01/02/20 1629)  diphenhydrAMINE (BENADRYL) injection 25 mg (25 mg Intravenous Given 01/02/20 1627)  dexamethasone (DECADRON) injection 10 mg (10 mg Intravenous Given 01/02/20 1628)    ED Course  I have reviewed the triage vital signs and the nursing notes.  Pertinent labs & imaging results that were available during my care of the patient were reviewed by me and considered in my medical decision making (see chart for details).    MDM Rules/Calculators/A&P                          Patient is a 49 year old female who presents primarily with a headache.  She had a low-grade fever this morning.  She does not have any meningismus.  No symptoms that sound more concerning for subarachnoid hemorrhage or meningitis.  She is well-appearing.  No other apparent etiology for infection.  Her urinalysis is clear.  Her Covid test is negative.  She does not have any definite URI symptoms although she feels like it is a  sinus type headache.  She got a migraine cocktail and her headache is completely resolved.  However she did tell the nurse that her headache was almost gone prior to getting the migraine cocktail.  She also has some back pain which seems to be an exacerbation of some chronic pain.  She does not have any neurologic deficits or signs of cauda equina.  No recent injuries.  Her symptoms are similar to her typical back pain.  This would make me have a low suspicion of other etiology such as epidural abscess.  She currently is feeling much better.  Her labs are nonconcerning.  Her hemoglobin is similar to her prior values.  She was discharged home in good condition.  She requests some pain medicine for her back to use at home.  She has tramadol and Tylenol 3.  She says she cannot take hydrocodone because makes her nauseated.  She was given a short prescription of 6 tablets of oxycodone and encouraged to follow-up with her PCP.  Return precautions were given. Final Clinical Impression(s) / ED Diagnoses Final diagnoses:  Acute nonintractable headache, unspecified headache type  Acute midline low back pain with bilateral sciatica    Rx / DC Orders ED Discharge Orders         Ordered    oxyCODONE-acetaminophen (PERCOCET/ROXICET) 5-325  MG tablet  Every 6 hours PRN        01/02/20 1736           Rolan Bucco, MD 01/02/20 1739

## 2020-01-10 ENCOUNTER — Other Ambulatory Visit: Payer: Self-pay

## 2020-01-10 ENCOUNTER — Ambulatory Visit (HOSPITAL_COMMUNITY)
Admission: RE | Admit: 2020-01-10 | Discharge: 2020-01-10 | Disposition: A | Discharge status: Home / Self Care | Payer: Medicare Other | Source: Ambulatory Visit | Attending: Internal Medicine | Admitting: Internal Medicine

## 2020-01-10 VITALS — BP 108/60 | HR 89 | Wt 212.0 lb

## 2020-01-10 DIAGNOSIS — M069 Rheumatoid arthritis, unspecified: Secondary | ICD-10-CM | POA: Insufficient documentation

## 2020-01-10 DIAGNOSIS — I5022 Chronic systolic (congestive) heart failure: Secondary | ICD-10-CM

## 2020-01-10 DIAGNOSIS — E119 Type 2 diabetes mellitus without complications: Secondary | ICD-10-CM | POA: Insufficient documentation

## 2020-01-10 DIAGNOSIS — I428 Other cardiomyopathies: Secondary | ICD-10-CM | POA: Diagnosis not present

## 2020-01-10 NOTE — Patient Instructions (Signed)
It was a pleasure seeing you today! ? ?MEDICATIONS: ?-No medication changes today ?-Call if you have questions about your medications. ? ? ?NEXT APPOINTMENT: ?Return to clinic in 2 months with Dr. McLean. ? ?In general, to take care of your heart failure: ?-Limit your fluid intake to 2 Liters (half-gallon) per day.   ?-Limit your salt intake to ideally 2-3 grams (2000-3000 mg) per day. ?-Weigh yourself daily and record, and bring that "weight diary" to your next appointment.  (Weight gain of 2-3 pounds in 1 day typically means fluid weight.) ?-The medications for your heart are to help your heart and help you live longer.   ?-Please contact us before stopping any of your heart medications. ? ?Call the clinic at [phone] with questions or to reschedule future appointments.  ?

## 2020-02-27 ENCOUNTER — Other Ambulatory Visit: Payer: Self-pay

## 2020-02-27 ENCOUNTER — Encounter: Payer: Self-pay | Admitting: Physical Therapy

## 2020-02-27 ENCOUNTER — Ambulatory Visit: Payer: Medicare Other | Attending: Orthopedic Surgery | Admitting: Physical Therapy

## 2020-02-27 DIAGNOSIS — G8929 Other chronic pain: Secondary | ICD-10-CM | POA: Insufficient documentation

## 2020-02-27 DIAGNOSIS — M6281 Muscle weakness (generalized): Secondary | ICD-10-CM | POA: Diagnosis present

## 2020-02-27 DIAGNOSIS — M545 Low back pain, unspecified: Secondary | ICD-10-CM | POA: Diagnosis present

## 2020-02-27 NOTE — Patient Instructions (Signed)
Access Code: [email or web address] URL: [email or web address] Date: 02/27/2020 Prepared by: Rosana Hoes  Exercises Supine Posterior Pelvic Tilt - 2 x daily - 7 x weekly - 10 reps - 5 seconds hold Supine March with Posterior Pelvic Tilt - 2 x daily - 7 x weekly - 20 reps Clamshell - 2 x daily - 7 x weekly - 15 reps Supine Piriformis Stretch with Foot on Ground - 2 x daily - 7 x weekly - 2 reps - 30 seconds hold Supine Lower Trunk Rotation - 2 x daily - 7 x weekly - 5 reps - 10 seconds hold

## 2020-02-27 NOTE — Therapy (Signed)
[facility] Outpatient Rehabilitation [facility] [address] [place], [place], [address] Phone: [phone]   Fax:  [phone]  Physical Therapy Evaluation  Patient Details  Name: [name] MRN: [id number] Date of Birth: 1970/12/25 Referring Provider (PT): Ward, Leonette Monarch, [place]   Encounter Date: 02/27/2020   PT End of Session - 02/27/20 1059    Visit Number 1    Number of Visits 6    Date for PT Re-Evaluation 04/09/20    Authorization Type UHC MCR    Authorization Time Period FOTO by 6th visit    Progress Note Due on Visit 10    PT Start Time 1045    PT Stop Time 1128    PT Time Calculation (min) 43 min    Activity Tolerance Patient tolerated treatment well    Behavior During Therapy [facility] for tasks assessed/performed           Past Medical History:  Diagnosis Date  . Allergy   . Asthma   . Cardiomyopathy    Non-ischemic  . Diabetes mellitus without complication (HCC)   . Herpes simplex of female genitalia   . Hypertension   . Rheumatoid arthritis(714.0)     Past Surgical History:  Procedure Laterality Date  . DENTAL SURGERY    . OVARY SURGERY    . RIGHT HEART CATH N/A 01/18/2017   Procedure: RIGHT HEART CATH;  Surgeon: Laurey Morale, MD;  Location: [facility] INVASIVE CV LAB;  Service: Cardiovascular;  Laterality: N/A;    There were no vitals filed for this visit.    Subjective Assessment - 02/27/20 1046    Subjective Patient reports her sciatica was acting up, but it's not bothering her anymore. She states it happens in spurts, it happens every 6 months. She doesn't know of any mechanism for the sciatic pain to start. Patient does report chronic low back pain for many years.    Limitations Sitting;Lifting;Standing;Walking;House hold activities    How long can you sit comfortably? 30 minutes    How long can you stand comfortably? 30-45 minutes    How long can you walk comfortably? 30-45 minutes    Diagnostic tests X-ray    Patient Stated  Goals Improve back pain and avoid future episodes of sciatic pain    Currently in Pain? Yes    Pain Score 2     Pain Location Back    Pain Orientation Lower    Pain Descriptors / Indicators Dull    Pain Type Chronic pain    Pain Radiating Towards when sciatic pain occurs can go down either leg, right leg most recently    Pain Onset More than a month ago    Pain Frequency Intermittent    Aggravating Factors  Sit, stand, walk extended periods    Pain Relieving Factors Rest    Effect of Pain on Daily Activities Patient limited with walking and standing              [facility] PT Assessment - 02/27/20 0001      Assessment   Medical Diagnosis Radiculopathy, lumbar region    Referring Provider (PT) Ward, Leonette Monarch, PA-C    Onset Date/Surgical Date --   patient reports many year history of back pain   Next MD Visit Not scheduled    Prior Therapy None      Precautions   Precautions None      Restrictions   Weight Bearing Restrictions No      Balance Screen  Has the patient fallen in the past 6 months No    Has the patient had a decrease in activity level because of a fear of falling?  No    Is the patient reluctant to leave their home because of a fear of falling?  No      Prior Function   Level of Independence Independent    Vocation Part time employment   4 hour shifts   Vocation Requirements Works for [employer], a lot of standing    Leisure Shopping      Cognition   Overall Cognitive Status Within Functional Limits for tasks assessed      Observation/Other Assessments   Observations Patient appears in no apparent distress    Focus on Therapeutic Outcomes (FOTO)  53% functional status      Sensation   Light Touch Appears Intact      Coordination   Gross Motor Movements are Fluid and Coordinated Yes      Posture/Postural Control   Posture Comments Patient remains in increased lumbar lordosis      ROM / Strength   AROM / PROM / Strength AROM;PROM;Strength      AROM    AROM Assessment Site Lumbar    Lumbar Flexion Able to reach toes, remains in relative lordosis    Lumbar Extension WFL    Lumbar - Right Side Bend WFL    Lumbar - Left Side Bend WFL    Lumbar - Right Rotation WFL    Lumbar - Left Rotation WFL      PROM   Overall PROM Comments Hip PROM grossly WFL and non-painful      Strength   Overall Strength Comments Core strength grossly 3+/5 MMT    Strength Assessment Site Hip;Knee    Right/Left Hip Right;Left    Right Hip Flexion 4-/5    Right Hip Extension 3/5    Right Hip ABduction 3+/5    Left Hip Flexion 4-/5    Left Hip Extension 3/5    Left Hip ABduction 3+/5    Right/Left Knee Right;Left    Right Knee Flexion 5/5    Right Knee Extension 5/5    Left Knee Flexion 5/5    Left Knee Extension 5/5      Flexibility   Soft Tissue Assessment /Muscle Length yes    Hamstrings WFL    Piriformis Limited      Palpation   Spinal mobility Not assessed    SI assessment  Not assessed    Palpation comment TTP bilateral lumbar paraspinals, upper gluteal regions      Special Tests   Other special tests Slump and SLR negative      Transfers   Transfers Independent with all Transfers                      Objective measurements completed on examination: See above findings.       [facility] Adult PT Treatment/Exercise - 02/27/20 0001      Exercises   Exercises Lumbar      Lumbar Exercises: Stretches   Lower Trunk Rotation 3 reps;10 seconds    Piriformis Stretch 2 reps;30 seconds      Lumbar Exercises: Supine   Pelvic Tilt 10 reps;5 seconds    Pelvic Tilt Limitations PPT focus on gluteal engagement    Bent Knee Raise 20 reps    Bent Knee Raise Limitations with PPT      Lumbar Exercises: Sidelying   Clam 15  reps                  PT Education - 02/27/20 1058    Education Details Exam findings, POC, HEP    Person(s) Educated Patient    Methods Explanation;Demonstration;Tactile cues;Verbal cues;Handout     Comprehension Verbalized understanding;Returned demonstration;Verbal cues required;Tactile cues required;Need further instruction            PT Short Term Goals - 02/27/20 1256      PT SHORT TERM GOAL #1   Title STG = LTG             PT Long Term Goals - 02/27/20 1059      PT LONG TERM GOAL #1   Title Patient will be I with final HEP to maintain progress from PT    Time 6    Period Weeks    Status New    Target Date 04/09/20      PT LONG TERM GOAL #2   Title Patient will understand results of FOTO by 3rd visit    Time 3    Period Weeks    Status New    Target Date 03/19/20      PT LONG TERM GOAL #3   Title Patient will report improved functional status to >/= 64% on FOTO    Time 6    Period Weeks    Status New    Target Date 04/09/20      PT LONG TERM GOAL #4   Title Patient will demonstrate lumbar AROM grossly WFL and non painful to improve ability to do household tasks    Time 6    Period Weeks    Status New    Target Date 04/09/20      PT LONG TERM GOAL #5   Title Patient will demonstrate gross hip and core strength >/= 4/5 MMT to improve lifting ability    Time 6    Period Weeks    Status New    Target Date 04/09/20      Additional Long Term Goals   Additional Long Term Goals Yes      PT LONG TERM GOAL #6   Title Patient will be able to stand and walk >/= 45 minutes with </= 2/10 pain level to improve working ability    Time 6    Period Weeks    Status New    Target Date 04/09/20                  Plan - 02/27/20 1302    Clinical Impression Statement Patient presents to PT with report of chronic low back pain with occasional exacerbation where she will have sciatic nerve pain. Currently she is only experiencing her low back pain which seems consistent with non-specific musculoskeletal lumbar pain. She does demonstrate increased lumbar lordosis, impaired core/hip strength with poor lumbopelvic control. No radicular symtpoms this visit. She  was provided exercises to initiate core strengthening and she would benefit from continued skilled PT to progress lumbar mobility and stength in order to reduce pain and improve activity tolerance.    Personal Factors and Comorbidities Fitness;Time since onset of injury/illness/exacerbation;Past/Current Experience    Examination-Activity Limitations Locomotion Level;Sit;Squat;Stand;Lift;Bend    Examination-Participation Restrictions Occupation;Community Activity;Driving;Shop    Stability/Clinical Decision Making Stable/Uncomplicated    Clinical Decision Making Low    Rehab Potential Good    PT Frequency 1x / week    PT Duration 6 weeks    PT Treatment/Interventions ADLs/Self Care  Home Management;Cryotherapy;Electrical Stimulation;Iontophoresis 4mg /ml Dexamethasone;Moist Heat;Traction;Ultrasound;Neuromuscular re-education;Balance training;Therapeutic exercise;Therapeutic activities;Functional mobility training;Stair training;Gait training;Patient/family education;Manual techniques;Dry needling;Passive range of motion;Taping;Spinal Manipulations;Joint Manipulations    PT Next Visit Plan Review HEP and progress PRN, progress core/hip strengthening and lumbopelvic control, manual/stretching for lumbar and hips, modalities PRN    PT Home Exercise Plan [email or web address]: supine PPT, marching, side clamshell, piriformis stretch, LTR    Consulted and Agree with Plan of Care Patient           Patient will benefit from skilled therapeutic intervention in order to improve the following deficits and impairments:  Decreased range of motion, Decreased activity tolerance, Pain, Postural dysfunction, Decreased strength, Improper body mechanics, Impaired flexibility  Visit Diagnosis: Chronic bilateral low back pain, unspecified whether sciatica present  Muscle weakness (generalized)     Problem List Patient Active Problem List   Diagnosis Date Noted  . Diverticulitis of large intestine with abscess without  bleeding   . Perforation of sigmoid colon due to diverticulitis 03/18/2018  . Abscess of sigmoid colon due to diverticulitis s/p perc drainage 03/15/2018 03/14/2018  . Chronic systolic heart failure (HCC) 05/20/2017  . Alopecia areata 08/26/2016  . Palpitations 10/31/2014  . Non-ischemic cardiomyopathy (HCC) 10/31/2014  . Pulmonary infiltrates 08/01/2013  . Cough 08/01/2013  . 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 Rheumatoid arthritis (HCC) 05/24/2012  . Hypertension 03/03/2012  . GERD (gastroesophageal reflux disease) 03/03/2012  . Morbid obesity,BMI of 45.0-49.9, adult 03/03/2012  . Rheumatoid arthritis(714.0) 03/03/2012  . Diabetes mellitus, type 2 (HCC) 03/03/2012  . Hypokalemia 03/03/2012  . Lupus (HCC) 03/02/2012  . ASTHMA 06/04/2010    Rosana Hoes, PT, DPT, LAT, ATC 02/27/20  1:10 PM Phone: [phone] Fax: [phone]   [facility] Outpatient Rehabilitation [facility] [address] [place], [place], [address] Phone: [phone]   Fax:  [phone]  Name: [name] MRN: [id number] Date of Birth: 01-Sep-1970

## 2020-03-12 ENCOUNTER — Ambulatory Visit: Payer: Medicare Other | Admitting: Physical Therapy

## 2020-03-18 ENCOUNTER — Encounter (HOSPITAL_COMMUNITY): Payer: Medicare Other | Admitting: Cardiology

## 2020-03-20 ENCOUNTER — Other Ambulatory Visit: Payer: Self-pay

## 2020-03-20 ENCOUNTER — Encounter: Payer: Self-pay | Admitting: Physical Therapy

## 2020-03-20 ENCOUNTER — Ambulatory Visit: Payer: Medicare Other | Attending: Orthopedic Surgery | Admitting: Physical Therapy

## 2020-03-20 DIAGNOSIS — G8929 Other chronic pain: Secondary | ICD-10-CM | POA: Insufficient documentation

## 2020-03-20 DIAGNOSIS — M545 Low back pain, unspecified: Secondary | ICD-10-CM | POA: Diagnosis present

## 2020-03-20 DIAGNOSIS — M6281 Muscle weakness (generalized): Secondary | ICD-10-CM | POA: Diagnosis present

## 2020-03-20 NOTE — Therapy (Signed)
[facility] Outpatient Rehabilitation [facility] [address] [place], [place], [address] Phone: [phone]   Fax:  [phone]  Physical Therapy Treatment   Patient Details  Name: TANICA WEILAND MRN: [id number] Date of Birth: [birth year] Referring Provider (PT): Ward, Leonette Monarch, [place]   Encounter Date: 03/20/2020   PT End of Session - 03/20/20 1020    Visit Number 2    Number of Visits 6    Date for PT Re-Evaluation 04/09/20    Authorization Type UHC MCR    Authorization Time Period FOTO by 6th visit    Progress Note Due on Visit 10    PT Start Time 1000    PT Stop Time 1040    PT Time Calculation (min) 40 min    Activity Tolerance Patient tolerated treatment well;Patient limited by pain    Behavior During Therapy [facility] for tasks assessed/performed           Past Medical History:  Diagnosis Date  . Allergy   . Asthma   . Cardiomyopathy    Non-ischemic  . Diabetes mellitus without complication (HCC)   . Herpes simplex of female genitalia   . Hypertension   . Rheumatoid arthritis(714.0)     Past Surgical History:  Procedure Laterality Date  . DENTAL SURGERY    . OVARY SURGERY    . RIGHT HEART CATH N/A 01/18/2017   Procedure: RIGHT HEART CATH;  Surgeon: Laurey Morale, MD;  Location: [facility] INVASIVE CV LAB;  Service: Cardiovascular;  Laterality: N/A;    There were no vitals filed for this visit.   Subjective Assessment - 03/20/20 1013    Subjective Patient reports since she has been working things have been aweful. States that everything hurts including, ankles, knees, hips, back. She did have an infusion for her arthritis and it did not help.    Patient Stated Goals Improve back pain and avoid future episodes of sciatic pain    Currently in Pain? Yes    Pain Score 8     Pain Location Back    Pain Orientation Lower    Pain Descriptors / Indicators Dull    Pain Type Chronic pain    Pain Radiating Towards bilateral hips    Pain Onset More than a month  ago    Pain Frequency Intermittent                             [facility] Adult PT Treatment/Exercise - 03/20/20 0001      Exercises   Exercises Lumbar      Lumbar Exercises: Stretches   Single Knee to Chest Stretch 2 reps;30 seconds    Lower Trunk Rotation 3 reps;10 seconds    Piriformis Stretch 2 reps;30 seconds      Lumbar Exercises: Aerobic   Nustep L3 x 5 min with LE only      Lumbar Exercises: Supine   Pelvic Tilt 10 reps;5 seconds      Lumbar Exercises: Sidelying   Clam 15 reps   2 sets     Manual Therapy   Manual Therapy Soft tissue mobilization    Soft tissue mobilization Bilat posterior/lateral hip and lower back with FR                  PT Education - 03/20/20 1016    Education Details HEP, FOTO    Person(s) Educated Patient    Methods Explanation    Comprehension Verbalized  understanding;Need further instruction            PT Short Term Goals - 02/27/20 1256      PT SHORT TERM GOAL #1   Title STG = LTG             PT Long Term Goals - 03/20/20 1227      PT LONG TERM GOAL #1   Title Patient will be I with final HEP to maintain progress from PT    Time 6    Period Weeks    Status New      PT LONG TERM GOAL #2   Title Patient will understand results of FOTO by 3rd visit    Time 3    Period Weeks    Status Achieved      PT LONG TERM GOAL #3   Title Patient will report improved functional status to >/= 64% on FOTO    Time 6    Period Weeks    Status New      PT LONG TERM GOAL #4   Title Patient will demonstrate lumbar AROM grossly WFL and non painful to improve ability to do household tasks    Time 6    Period Weeks    Status New      PT LONG TERM GOAL #5   Title Patient will demonstrate gross hip and core strength >/= 4/5 MMT to improve lifting ability    Time 6    Period Weeks    Status New      PT LONG TERM GOAL #6   Title Patient will be able to stand and walk >/= 45 minutes with </= 2/10 pain level to  improve working ability    Time 6    Period Weeks    Status New                 Plan - 03/20/20 1021    Clinical Impression Statement Patient tolerated therapy well with no adverse effects. She reported improvement in symptoms following manual and was able to tolerate therex without increase in pain level. Current pain this visit was diffuse and she reported this was not her typical low back or sciatic pain likely related to increased activity level with work. She would benefit from continued skilled PT to progress lumbar mobility and stength in order to reduce pain and improve activity tolerance.    PT Treatment/Interventions ADLs/Self Care Home Management;Cryotherapy;Electrical Stimulation;Iontophoresis 4mg /ml Dexamethasone;Moist Heat;Traction;Ultrasound;Neuromuscular re-education;Balance training;Therapeutic exercise;Therapeutic activities;Functional mobility training;Stair training;Gait training;Patient/family education;Manual techniques;Dry needling;Passive range of motion;Taping;Spinal Manipulations;Joint Manipulations    PT Next Visit Plan Review HEP and progress PRN, progress core/hip strengthening and lumbopelvic control, manual/stretching for lumbar and hips, modalities PRN    PT Home Exercise Plan [email or web address]: supine PPT, marching, side clamshell, piriformis stretch, LTR    Consulted and Agree with Plan of Care Patient           Patient will benefit from skilled therapeutic intervention in order to improve the following deficits and impairments:  Decreased range of motion,Decreased activity tolerance,Pain,Postural dysfunction,Decreased strength,Improper body mechanics,Impaired flexibility  Visit Diagnosis: Chronic bilateral low back pain, unspecified whether sciatica present  Muscle weakness (generalized)     Problem List Patient Active Problem List   Diagnosis Date Noted  . Diverticulitis of large intestine with abscess without bleeding   . Perforation of sigmoid colon  due to diverticulitis 03/18/2018  . Abscess of sigmoid colon due to diverticulitis s/p perc drainage 03/15/2018 03/14/2018  . Chronic systolic  heart failure (HCC) 05/20/2017  . Alopecia areata 08/26/2016  . Palpitations 10/31/2014  . Non-ischemic cardiomyopathy (HCC) 10/31/2014  . Pulmonary infiltrates 08/01/2013  . Cough 08/01/2013  . 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 Rheumatoid arthritis (HCC) 05/24/2012  . Hypertension 03/03/2012  . GERD (gastroesophageal reflux disease) 03/03/2012  . Morbid obesity,BMI of 45.0-49.9, adult 03/03/2012  . Rheumatoid arthritis(714.0) 03/03/2012  . Diabetes mellitus, type 2 (HCC) 03/03/2012  . Hypokalemia 03/03/2012  . Lupus (HCC) 03/02/2012  . ASTHMA 06/04/2010    Rosana Hoes, PT, DPT, LAT, ATC 03/20/20  12:34 PM Phone: [phone] Fax: [phone]   [facility] Outpatient Rehabilitation [facility] [address] [place], [place], [address] Phone: [phone]   Fax:  [phone]  Name: DEONNA KRUMMEL MRN: [id number] Date of Birth: May 05, 1970

## 2020-03-28 ENCOUNTER — Ambulatory Visit: Payer: Medicare Other | Admitting: Physical Therapy

## 2020-03-28 ENCOUNTER — Other Ambulatory Visit: Payer: Self-pay

## 2020-03-28 ENCOUNTER — Encounter: Payer: Self-pay | Admitting: Physical Therapy

## 2020-03-28 DIAGNOSIS — M6281 Muscle weakness (generalized): Secondary | ICD-10-CM

## 2020-03-28 DIAGNOSIS — M545 Low back pain, unspecified: Secondary | ICD-10-CM | POA: Diagnosis not present

## 2020-03-28 NOTE — Patient Instructions (Signed)
Access Code: [email or web address] URL: [email or web address] Date: 03/28/2020 Prepared by: Rosana Hoes  Exercises Supine Piriformis Stretch with Foot on Ground - 2 x daily - 7 x weekly - 2 reps - 30 seconds hold Supine Lower Trunk Rotation - 2 x daily - 7 x weekly - 5 reps - 10 seconds hold Thomas Stretch - 2 x daily - 7 x weekly - 2 reps - 30 seconds hold Supine Posterior Pelvic Tilt - 2 x daily - 7 x weekly - 10 reps - 5 seconds hold Bridge - 1 x daily - 7 x weekly - 2 sets - 10 reps - 3 seconds hold Child's Pose Stretch - 2 x daily - 7 x weekly - 2 reps - 30 seconds hold Hooklying Isometric Clamshell - 1 x daily - 7 x weekly - 2 sets - 10 reps Supine March with Resistance Band - 1 x daily - 7 x weekly - 2 sets - 10 reps

## 2020-03-28 NOTE — Therapy (Addendum)
[facility] [address] [place], [place], [address] Phone: [phone]   Fax:  [phone]  Physical Therapy Treatment  Patient Details  Name: [name] MRN: [id number] Date of Birth: [birth year] Referring Provider (PT): Ward, Leonette Monarch, [place]   Encounter Date: 03/28/2020   PT End of Session - 03/28/20 1009    Visit Number 3    Number of Visits 6    Date for PT Re-Evaluation 04/09/20    Authorization Type UHC MCR    Authorization Time Period FOTO by 6th visit    Progress Note Due on Visit 10    PT Start Time 1008    PT Stop Time 1046    PT Time Calculation (min) 38 min    Activity Tolerance Patient tolerated treatment well;Patient limited by pain    Behavior During Therapy [facility] for tasks assessed/performed           Past Medical History:  Diagnosis Date  . Allergy   . Asthma   . Cardiomyopathy    Non-ischemic  . Diabetes mellitus without complication (HCC)   . Herpes simplex of female genitalia   . Hypertension   . Rheumatoid arthritis(714.0)     Past Surgical History:  Procedure Laterality Date  . DENTAL SURGERY    . OVARY SURGERY    . RIGHT HEART CATH N/A 01/18/2017   Procedure: RIGHT HEART CATH;  Surgeon: Laurey Morale, MD;  Location: [facility] INVASIVE CV LAB;  Service: Cardiovascular;  Laterality: N/A;    There were no vitals filed for this visit.   Subjective Assessment - 03/28/20 1010    Subjective Patient reports she is going to switch up her schedule at work to reduce hours because work makes her whole body hurt.    Patient Stated Goals Improve back pain and avoid future episodes of sciatic pain    Currently in Pain? Yes    Pain Score 3     Pain Location Back    Pain Orientation Lower    Pain Descriptors / Indicators Dull;Aching;Sore    Pain Type Chronic pain    Pain Onset More than a month ago    Pain Frequency Intermittent                             [facility] Adult PT  Treatment/Exercise - 03/28/20 0001      Exercises   Exercises Lumbar      Lumbar Exercises: Stretches   Single Knee to Chest Stretch 2 reps;20 seconds    Lower Trunk Rotation 5 reps;10 seconds    Lower Trunk Rotation Limitations figure-4 position    Hip Flexor Stretch 2 reps;30 seconds    Hip Flexor Stretch Limitations thomas stretch    Piriformis Stretch 2 reps;20 seconds    Other Lumbar Stretch Exercise Child's pose stretch 2 x 20 sec      Lumbar Exercises: Aerobic   Nustep L5 x 5 min with LE only      Lumbar Exercises: Supine   Pelvic Tilt 10 reps;5 seconds    Clam 10 reps;3 seconds   2 sets   Clam Limitations unilateral with red band   cueing to avoid pelvic rotation   Bent Knee Raise 20 reps   2 sets   Bent Knee Raise Limitations red    Bridge 10 reps;3 seconds    Bridge Limitations cueing for gluteal engagement  PT Education - 03/28/20 1009    Education Details HEP update    Person(s) Educated Patient    Methods Explanation;Demonstration;Verbal cues;Handout;Tactile cues    Comprehension Verbalized understanding;Need further instruction;Returned demonstration;Verbal cues required;Tactile cues required            PT Short Term Goals - 02/27/20 1256      PT SHORT TERM GOAL #1   Title STG = LTG             PT Long Term Goals - 03/20/20 1227      PT LONG TERM GOAL #1   Title Patient will be I with final HEP to maintain progress from PT    Time 6    Period Weeks    Status New      PT LONG TERM GOAL #2   Title Patient will understand results of FOTO by 3rd visit    Time 3    Period Weeks    Status Achieved      PT LONG TERM GOAL #3   Title Patient will report improved functional status to >/= 64% on FOTO    Time 6    Period Weeks    Status New      PT LONG TERM GOAL #4   Title Patient will demonstrate lumbar AROM grossly WFL and non painful to improve ability to do household tasks    Time 6    Period Weeks    Status New       PT LONG TERM GOAL #5   Title Patient will demonstrate gross hip and core strength >/= 4/5 MMT to improve lifting ability    Time 6    Period Weeks    Status New      PT LONG TERM GOAL #6   Title Patient will be able to stand and walk >/= 45 minutes with </= 2/10 pain level to improve working ability    Time 6    Period Weeks    Status New                 Plan - 03/28/20 1014    Clinical Impression Statement Patient tolerated therapy well with no adverse effects. Able to progress core/lumbopelvic control this visit with good tolerance. Continuous cueing required for proper muscular activation and maintain lumbopelvic control. Patient did report occurrences of pain shooting through lower back but was not consistent and would switch from side to side. HEP progressed with new stretches and supine strengthening. She would benefit from continued skilled PT to progress lumbar mobility and stength in order to reduce pain and improve activity tolerance.    PT Treatment/Interventions ADLs/Self Care Home Management;Cryotherapy;Electrical Stimulation;Iontophoresis 4mg /ml Dexamethasone;Moist Heat;Traction;Ultrasound;Neuromuscular re-education;Balance training;Therapeutic exercise;Therapeutic activities;Functional mobility training;Stair training;Gait training;Patient/family education;Manual techniques;Dry needling;Passive range of motion;Taping;Spinal Manipulations;Joint Manipulations    PT Next Visit Plan Review HEP and progress PRN, progress core/hip strengthening and lumbopelvic control, manual/stretching for lumbar and hips, modalities PRN    PT Home Exercise Plan [id number]: piriformis stretch, LTR, thomas stretch, child's pose stretch, supine pelvic tilt, bridge, supine clam and marching with red    Consulted and Agree with Plan of Care Patient           Patient will benefit from skilled therapeutic intervention in order to improve the following deficits and impairments:  Decreased range  of motion,Decreased activity tolerance,Pain,Postural dysfunction,Decreased strength,Improper body mechanics,Impaired flexibility  Visit Diagnosis: Chronic bilateral low back pain, unspecified whether sciatica present  Muscle weakness (generalized)  Problem List Patient Active Problem List   Diagnosis Date Noted  . Diverticulitis of large intestine with abscess without bleeding   . Perforation of sigmoid colon due to diverticulitis 03/18/2018  . Abscess of sigmoid colon due to diverticulitis s/p perc drainage 03/15/2018 03/14/2018  . Chronic systolic heart failure (HCC) 05/20/2017  . Alopecia areata 08/26/2016  . Palpitations 10/31/2014  . Non-ischemic cardiomyopathy (HCC) 10/31/2014  . Pulmonary infiltrates 08/01/2013  . Cough 08/01/2013  . 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 Rheumatoid arthritis (HCC) 05/24/2012  . Hypertension 03/03/2012  . GERD (gastroesophageal reflux disease) 03/03/2012  . Morbid obesity,BMI of 45.0-49.9, adult 03/03/2012  . Rheumatoid arthritis(714.0) 03/03/2012  . Diabetes mellitus, type 2 (HCC) 03/03/2012  . Hypokalemia 03/03/2012  . Lupus (HCC) 03/02/2012  . ASTHMA 06/04/2010    Rosana Hoes, PT, DPT, LAT, ATC 03/28/20  11:02 AM Phone: [phone] Fax: [phone]   [facility] [address] [place], [place], [address] Phone: [phone]   Fax:  [phone]  Name: [name] MRN: [id number] Date of Birth: 05/19/1970

## 2020-03-31 ENCOUNTER — Other Ambulatory Visit: Payer: Self-pay | Admitting: Family Medicine

## 2020-03-31 DIAGNOSIS — M79605 Pain in left leg: Secondary | ICD-10-CM

## 2020-03-31 DIAGNOSIS — M79604 Pain in right leg: Secondary | ICD-10-CM

## 2020-04-03 ENCOUNTER — Other Ambulatory Visit: Payer: Self-pay

## 2020-04-03 ENCOUNTER — Ambulatory Visit: Payer: Medicare Other | Admitting: Physical Therapy

## 2020-04-03 DIAGNOSIS — M545 Low back pain, unspecified: Secondary | ICD-10-CM | POA: Diagnosis not present

## 2020-04-03 DIAGNOSIS — M6281 Muscle weakness (generalized): Secondary | ICD-10-CM

## 2020-04-03 DIAGNOSIS — G8929 Other chronic pain: Secondary | ICD-10-CM

## 2020-04-03 NOTE — Therapy (Signed)
[facility] Outpatient [facility] [address] [place], [place], [address] Phone: [phone]   Fax:  [phone]  Physical Therapy Treatment  Patient Details  Name: [name] MRN: [id number] Date of Birth: 1970-12-21 Referring Provider (PT): Ward, Leonette Monarch, [place]   Encounter Date: 04/03/2020   PT End of Session - 04/03/20 1007    Visit Number 4    Number of Visits 6    Date for PT Re-Evaluation 04/09/20    Authorization Type UHC MCR    Authorization Time Period FOTO by 6th visit    Progress Note Due on Visit 10    PT Start Time 1004    PT Stop Time 1043    PT Time Calculation (min) 39 min    Activity Tolerance Patient tolerated treatment well    Behavior During Therapy [facility] for tasks assessed/performed           Past Medical History:  Diagnosis Date  . Allergy   . Asthma   . Cardiomyopathy    Non-ischemic  . Diabetes mellitus without complication (HCC)   . Herpes simplex of female genitalia   . Hypertension   . Rheumatoid arthritis(714.0)     Past Surgical History:  Procedure Laterality Date  . DENTAL SURGERY    . OVARY SURGERY    . RIGHT HEART CATH N/A 01/18/2017   Procedure: RIGHT HEART CATH;  Surgeon: Laurey Morale, MD;  Location: [facility] INVASIVE CV LAB;  Service: Cardiovascular;  Laterality: N/A;    There were no vitals filed for this visit.   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 Sometimes I can barely walk out of work (ankle, knee, back) Sometimes the exercises make my hurt more too.  Back is not hurting right now.    Currently in Pain? No/denies               [facility] Adult PT Treatment/Exercise - 04/03/20 0001      Lumbar Exercises: Stretches   Lower Trunk Rotation 5 reps;10 seconds    Lower Trunk Rotation Limitations feet together    Hip Flexor Stretch 3 reps;20 seconds    Hip Flexor Stretch Limitations standing    Pelvic Tilt 10 reps     Other Lumbar Stretch Exercise Child's pose stretch 3 x 20 sec      Lumbar Exercises: Standing   Row Strengthening;Both;Theraband;20 reps    Theraband Level (Row) Level 4 (Blue)    Other Standing Lumbar Exercises core press in standing with ball x 10    Other Standing Lumbar Exercises Palloff press blue x 15 added rotation x 10 each side      Lumbar Exercises: Supine   Bridge 10 reps    Bridge Limitations cues for core bracing                    PT Short Term Goals - 02/27/20 1256      PT SHORT TERM GOAL #1   Title STG = LTG             PT Long Term Goals - 03/20/20 1227      PT LONG TERM GOAL #1   Title Patient will be I with final HEP to maintain progress from PT    Time 6    Period Weeks    Status New      PT LONG TERM GOAL #2   Title  Patient will understand results of FOTO by 3rd visit    Time 3    Period Weeks    Status Achieved      PT LONG TERM GOAL #3   Title Patient will report improved functional status to >/= 64% on FOTO    Time 6    Period Weeks    Status New      PT LONG TERM GOAL #4   Title Patient will demonstrate lumbar AROM grossly WFL and non painful to improve ability to do household tasks    Time 6    Period Weeks    Status New      PT LONG TERM GOAL #5   Title Patient will demonstrate gross hip and core strength >/= 4/5 MMT to improve lifting ability    Time 6    Period Weeks    Status New      PT LONG TERM GOAL #6   Title Patient will be able to stand and walk >/= 45 minutes with </= 2/10 pain level to improve working ability    Time 6    Period Weeks    Status New                 Plan - 04/03/20 1008    Clinical Impression Statement Patient doing well today, tolerated exercises well with cues needed for core activation.  Modified her exercises to be less painful for her (piriformis and hip flexor).  No pain during session. Continue to advance to more work simulation and lifting.    PT Treatment/Interventions  ADLs/Self Care Home Management;Cryotherapy;Electrical Stimulation;Iontophoresis 4mg /ml Dexamethasone;Moist Heat;Traction;Ultrasound;Neuromuscular re-education;Balance training;Therapeutic exercise;Therapeutic activities;Functional mobility training;Stair training;Gait training;Patient/family education;Manual techniques;Dry needling;Passive range of motion;Taping;Spinal Manipulations;Joint Manipulations    PT Next Visit Plan Review HEP and progress PRN, progress core/hip strengthening and lumbopelvic control, manual/stretching for lumbar and hips, modalities PRN    PT Home Exercise Plan [id number]: piriformis stretch, LTR, thomas stretch, child's pose stretch, supine pelvic tilt, bridge, supine clam and marching with red. MOdifed for comfort .    Consulted and Agree with Plan of Care Patient           Patient will benefit from skilled therapeutic intervention in order to improve the following deficits and impairments:     Visit Diagnosis: Chronic bilateral low back pain, unspecified whether sciatica present  Muscle weakness (generalized)     Problem List Patient Active Problem List   Diagnosis Date Noted  . Diverticulitis of large intestine with abscess without bleeding   . Perforation of sigmoid colon due to diverticulitis 03/18/2018  . Abscess of sigmoid colon due to diverticulitis s/p perc drainage 03/15/2018 03/14/2018  . Chronic systolic heart failure (HCC) 05/20/2017  . Alopecia areata 08/26/2016  . Palpitations 10/31/2014  . Non-ischemic cardiomyopathy (HCC) 10/31/2014  . Pulmonary infiltrates 08/01/2013  . Cough 08/01/2013  . 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 Rheumatoid arthritis (HCC) 05/24/2012  . Hypertension 03/03/2012  . GERD (gastroesophageal reflux disease) 03/03/2012  . Morbid obesity,BMI of 45.0-49.9, adult 03/03/2012  . Rheumatoid arthritis(714.0) 03/03/2012  . Diabetes mellitus, type 2 (HCC)  03/03/2012  . Hypokalemia 03/03/2012  . Lupus (HCC) 03/02/2012  . ASTHMA 06/04/2010    [name] 04/03/2020, 1:18 PM  [facility] [address] [place], [place], [address] Phone: [phone]   Fax:  [phone]  Name: [name] MRN: [id number] Date of Birth: June 04, 1970   Karie Mainland,  PT 04/03/20 1:18 PM Phone: [phone] Fax: [phone]

## 2020-04-10 ENCOUNTER — Ambulatory Visit: Payer: Medicare Other | Admitting: Physical Therapy

## 2020-04-10 ENCOUNTER — Telehealth: Payer: Self-pay | Admitting: Physical Therapy

## 2020-04-10 NOTE — Telephone Encounter (Signed)
Pt was a no -show for her appt today at 10:45., called and left voicemail asking her to return the call to notify us if she needs a rescheduled appt or to be discharged.  Karie Mainland, PT 04/10/20 11:06 AM Phone: [phone] Fax: [phone]

## 2020-04-21 DIAGNOSIS — M0600 Rheumatoid arthritis without rheumatoid factor, unspecified site: Secondary | ICD-10-CM | POA: Diagnosis not present

## 2020-04-21 DIAGNOSIS — M2550 Pain in unspecified joint: Secondary | ICD-10-CM | POA: Diagnosis not present

## 2020-04-21 DIAGNOSIS — Z79899 Other long term (current) drug therapy: Secondary | ICD-10-CM | POA: Diagnosis not present

## 2020-04-23 DIAGNOSIS — M5416 Radiculopathy, lumbar region: Secondary | ICD-10-CM | POA: Diagnosis not present

## 2020-04-23 DIAGNOSIS — M5459 Other low back pain: Secondary | ICD-10-CM | POA: Diagnosis not present

## 2020-04-23 DIAGNOSIS — M545 Low back pain, unspecified: Secondary | ICD-10-CM | POA: Diagnosis not present

## 2020-04-24 DIAGNOSIS — M25571 Pain in right ankle and joints of right foot: Secondary | ICD-10-CM | POA: Diagnosis not present

## 2020-04-29 DIAGNOSIS — I5022 Chronic systolic (congestive) heart failure: Secondary | ICD-10-CM | POA: Diagnosis not present

## 2020-04-29 DIAGNOSIS — M5432 Sciatica, left side: Secondary | ICD-10-CM | POA: Diagnosis not present

## 2020-04-29 DIAGNOSIS — M069 Rheumatoid arthritis, unspecified: Secondary | ICD-10-CM | POA: Diagnosis not present

## 2020-04-29 DIAGNOSIS — J45909 Unspecified asthma, uncomplicated: Secondary | ICD-10-CM | POA: Diagnosis not present

## 2020-04-29 DIAGNOSIS — M329 Systemic lupus erythematosus, unspecified: Secondary | ICD-10-CM | POA: Diagnosis not present

## 2020-05-07 ENCOUNTER — Ambulatory Visit: Payer: Medicare Other | Admitting: Physical Therapy

## 2020-05-07 DIAGNOSIS — M0600 Rheumatoid arthritis without rheumatoid factor, unspecified site: Secondary | ICD-10-CM | POA: Diagnosis not present

## 2020-05-14 DIAGNOSIS — M069 Rheumatoid arthritis, unspecified: Secondary | ICD-10-CM | POA: Diagnosis not present

## 2020-05-14 DIAGNOSIS — M25571 Pain in right ankle and joints of right foot: Secondary | ICD-10-CM | POA: Diagnosis not present

## 2020-05-14 DIAGNOSIS — M25572 Pain in left ankle and joints of left foot: Secondary | ICD-10-CM | POA: Diagnosis not present

## 2020-05-22 ENCOUNTER — Other Ambulatory Visit: Payer: Self-pay

## 2020-05-22 ENCOUNTER — Encounter: Payer: Self-pay | Admitting: Physical Therapy

## 2020-05-22 ENCOUNTER — Ambulatory Visit: Payer: Medicare Other | Attending: Orthopedic Surgery | Admitting: Physical Therapy

## 2020-05-22 DIAGNOSIS — G8929 Other chronic pain: Secondary | ICD-10-CM | POA: Insufficient documentation

## 2020-05-22 DIAGNOSIS — M6281 Muscle weakness (generalized): Secondary | ICD-10-CM | POA: Diagnosis not present

## 2020-05-22 DIAGNOSIS — M545 Low back pain, unspecified: Secondary | ICD-10-CM | POA: Diagnosis not present

## 2020-05-22 NOTE — Therapy (Signed)
[facility] Outpatient Rehabilitation [facility] [address] [place], [place], [address] Phone: [phone]   Fax:  [phone]  Physical Therapy Treatment/Re-Evaluation  Patient Details  Name: [name] MRN: [id number] Date of Birth: 05/12/1970 Referring Provider (PT): Ward, Leonette Monarch, [place]   Encounter Date: 05/22/2020   PT End of Session - 05/22/20 0935    Visit Number 5    Number of Visits 11    Date for PT Re-Evaluation 07/03/20    Authorization Type UHC MCR    Authorization Time Period FOTO at DC    PT Start Time 0920    PT Stop Time 1000    PT Time Calculation (min) 40 min    Activity Tolerance Patient tolerated treatment well    Behavior During Therapy [facility] for tasks assessed/performed           Past Medical History:  Diagnosis Date  . Allergy   . Asthma   . Cardiomyopathy    Non-ischemic  . Diabetes mellitus without complication (HCC)   . Herpes simplex of female genitalia   . Hypertension   . Rheumatoid arthritis(714.0)     Past Surgical History:  Procedure Laterality Date  . DENTAL SURGERY    . OVARY SURGERY    . RIGHT HEART CATH N/A 01/18/2017   Procedure: RIGHT HEART CATH;  Surgeon: Laurey Morale, MD;  Location: [facility] INVASIVE CV LAB;  Service: Cardiovascular;  Laterality: N/A;    There were no vitals filed for this visit.   Subjective Assessment - 05/22/20 0926    Subjective Pt has not been to PT since 12/23  Reports a flare up of RA which shows up in her ankle, knees.  She received a cortisone shot in her ankle and that did nothing.  Still working at [employer] part time.  Standing and lifting.    Pertinent History RA    Limitations Sitting;Lifting;Standing;Walking;House hold activities    How long can you sit comfortably? 30 minutes    How long can you stand comfortably? 30-45 minutes    How long can you walk comfortably? 30-45 minutes    Diagnostic tests X-ray, MRI next week    Patient Stated Goals Improve back pain and avoid  future episodes of sciatic pain    Currently in Pain? Yes    Pain Score 2     Pain Location Back    Pain Orientation Lower;Right;Left    Pain Descriptors / Indicators Aching    Pain Type Chronic pain    Pain Radiating Towards Rt thigh    Pain Onset More than a month ago    Pain Frequency Intermittent    Aggravating Factors  sitting too long, walking , standing    Pain Relieving Factors rest, heating pad and Tylenol    Multiple Pain Sites Yes    Pain Score --   no time for assessment of knee shoulder and ankle             [facility] PT Assessment - 05/22/20 0001      AROM   Lumbar Flexion WNL   pain in back   Lumbar Extension WNL    Lumbar - Right Side Bend WNL   no pain   Lumbar - Left Side Bend WNL   min pain   Lumbar - Right Rotation WNL    Lumbar - Left Rotation WNL             [facility] Adult PT Treatment/Exercise - 05/22/20 0001  Lumbar Exercises: Stretches   Single Knee to Chest Stretch 3 reps    Lower Trunk Rotation 5 reps;10 seconds    Lower Trunk Rotation Limitations feet together    Pelvic Tilt 10 reps    Piriformis Stretch 2 reps    Piriformis Stretch Limitations mod for shoulder pain    Other Lumbar Stretch Exercise mod childs pose in standing at sink for lumbar stretch      Lumbar Exercises: Aerobic   Nustep LE only L3 for 6 min      Lumbar Exercises: Supine   Clam 20 reps    Clam Limitations green band    Bent Knee Raise 10 reps    Bent Knee Raise Limitations green band    Bridge 10 reps    Bridge Limitations unable due to hip pain    Straight Leg Raise 10 reps                  PT Education - 05/22/20 0942    Education Details HEP, renewal    Person(s) Educated Patient    Methods Explanation;Handout    Comprehension Verbalized understanding;Returned demonstration            PT Short Term Goals - 02/27/20 1256      PT SHORT TERM GOAL #1   Title STG = LTG             PT Long Term Goals - 05/22/20 0929      PT LONG TERM  GOAL #1   Title Patient will be I with final HEP to maintain progress from PT    Baseline has not done has her HEP due to pain and work    Time 6    Period Weeks    Status On-going    Target Date 07/03/20      PT LONG TERM GOAL #2   Title Patient will understand results of FOTO by 3rd visit    Status Achieved    Target Date 07/03/20      PT LONG TERM GOAL #3   Title Patient will report improved functional status to >/= 64% on FOTO    Status On-going    Target Date 07/03/20      PT LONG TERM GOAL #4   Title Patient will demonstrate lumbar AROM grossly WFL and non painful to improve ability to do household tasks    Baseline pain with flexion and rotation, lateral flexion    Status On-going    Target Date 07/03/20      PT LONG TERM GOAL #5   Title Patient will demonstrate gross hip and core strength >/= 4/5 MMT to improve lifting ability    Baseline knees 5/5, weak in core , hips    Status On-going    Target Date 07/03/20      PT LONG TERM GOAL #6   Title Patient will be able to stand and walk >/= 45 minutes with </= 2/10 pain level to improve working ability    Time 6    Period Weeks    Status On-going    Target Date 07/03/20                 Plan - 05/22/20 0933    Clinical Impression Statement Patient returns from extended period of no PT.  She has not seen her MD (for this issue).  Her Rt shoulder and Rt ankle has been very painful due to RA flare up.  We reviewed HEP and  re-evaluated her for cont PT.  PRior to this she was really doing well.  So I renewed her for 6 more weeks to see if we can help her recover and reduce pain with work and ADLs. She also reported pain when she tries her HEP, if this continues to be the case we may send her back to the MD and not finish this episode of care.    PT Frequency 1x / week    PT Duration 6 weeks    PT Treatment/Interventions ADLs/Self Care Home Management;Cryotherapy;Electrical Stimulation;Iontophoresis 4mg /ml  Dexamethasone;Moist Heat;Traction;Ultrasound;Neuromuscular re-education;Balance training;Therapeutic exercise;Therapeutic activities;Functional mobility training;Stair training;Gait training;Patient/family education;Manual techniques;Dry needling;Passive range of motion;Taping;Spinal Manipulations;Joint Manipulations    PT Next Visit Plan Review HEP and progress PRN, progress core/hip strengthening and lumbopelvic control, manual/stretching for lumbar and hips, modalities PRN    PT Home Exercise Plan [id number]: piriformis stretch, LTR, thomas stretch, child's pose stretch, supine pelvic tilt, bridge, supine clam and marching with red. Mdifed for comfort .    Consulted and Agree with Plan of Care Patient           Patient will benefit from skilled therapeutic intervention in order to improve the following deficits and impairments:  Decreased range of motion,Decreased activity tolerance,Pain,Postural dysfunction,Decreased strength,Improper body mechanics,Impaired flexibility  Visit Diagnosis: Chronic bilateral low back pain, unspecified whether sciatica present  Muscle weakness (generalized)     Problem List Patient Active Problem List   Diagnosis Date Noted  . Diverticulitis of large intestine with abscess without bleeding   . Perforation of sigmoid colon due to diverticulitis 03/18/2018  . Abscess of sigmoid colon due to diverticulitis s/p perc drainage 03/15/2018 03/14/2018  . Chronic systolic heart failure (HCC) 05/20/2017  . Alopecia areata 08/26/2016  . Palpitations 10/31/2014  . Non-ischemic cardiomyopathy (HCC) 10/31/2014  . Pulmonary infiltrates 08/01/2013  . Cough 08/01/2013  . 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 Rheumatoid arthritis (HCC) 05/24/2012  . Hypertension 03/03/2012  . GERD (gastroesophageal reflux disease) 03/03/2012  . Morbid obesity,BMI of 45.0-49.9, adult 03/03/2012  . Rheumatoid  arthritis(714.0) 03/03/2012  . Diabetes mellitus, type 2 (HCC) 03/03/2012  . Hypokalemia 03/03/2012  . Lupus (HCC) 03/02/2012  . ASTHMA 06/04/2010    Kerin Kren 05/22/2020, 12:40 PM  [facility] [address] [place], [place], [place] Phone: [phone]   Fax:  [phone]  Name: CAMORA TREMAIN MRN: Genia Harold Date of Birth: 05/03/1970   03/29/1971, PT 05/22/20 12:40 PM Phone: [phone] Fax: [phone]

## 2020-05-27 DIAGNOSIS — I87393 Chronic venous hypertension (idiopathic) with other complications of bilateral lower extremity: Secondary | ICD-10-CM | POA: Diagnosis not present

## 2020-05-27 DIAGNOSIS — R600 Localized edema: Secondary | ICD-10-CM | POA: Diagnosis not present

## 2020-05-27 DIAGNOSIS — M79604 Pain in right leg: Secondary | ICD-10-CM | POA: Diagnosis not present

## 2020-05-29 DIAGNOSIS — M5416 Radiculopathy, lumbar region: Secondary | ICD-10-CM | POA: Diagnosis not present

## 2020-06-06 DIAGNOSIS — R600 Localized edema: Secondary | ICD-10-CM | POA: Diagnosis not present

## 2020-06-06 DIAGNOSIS — M79604 Pain in right leg: Secondary | ICD-10-CM | POA: Diagnosis not present

## 2020-06-06 DIAGNOSIS — I87393 Chronic venous hypertension (idiopathic) with other complications of bilateral lower extremity: Secondary | ICD-10-CM | POA: Diagnosis not present

## 2020-06-11 DIAGNOSIS — M79651 Pain in right thigh: Secondary | ICD-10-CM | POA: Diagnosis not present

## 2020-06-11 DIAGNOSIS — I83891 Varicose veins of right lower extremities with other complications: Secondary | ICD-10-CM | POA: Diagnosis not present

## 2020-06-11 DIAGNOSIS — I83811 Varicose veins of right lower extremities with pain: Secondary | ICD-10-CM | POA: Diagnosis not present

## 2020-06-11 DIAGNOSIS — M79604 Pain in right leg: Secondary | ICD-10-CM | POA: Diagnosis not present

## 2020-06-17 ENCOUNTER — Other Ambulatory Visit: Payer: Self-pay

## 2020-06-17 ENCOUNTER — Encounter: Payer: Self-pay | Admitting: Physical Therapy

## 2020-06-17 ENCOUNTER — Ambulatory Visit: Payer: Medicare Other | Attending: Orthopedic Surgery | Admitting: Physical Therapy

## 2020-06-17 DIAGNOSIS — M6281 Muscle weakness (generalized): Secondary | ICD-10-CM | POA: Diagnosis not present

## 2020-06-17 DIAGNOSIS — M545 Low back pain, unspecified: Secondary | ICD-10-CM | POA: Insufficient documentation

## 2020-06-17 DIAGNOSIS — G8929 Other chronic pain: Secondary | ICD-10-CM | POA: Insufficient documentation

## 2020-06-17 NOTE — Therapy (Signed)
[place], [place], [address] Phone: [phone]   Fax:  [phone]  Physical Therapy Treatment  Patient Details  Name: [name] MRN: [id number] Date of Birth: April 02, 1971 Referring Provider (PT): Ward, Yvonne Kendall, [place]   Encounter Date: 06/17/2020   PT End of Session - 06/17/20 1145    Visit Number 6    Number of Visits 11    Date for PT Re-Evaluation 07/03/20    Authorization Type UHC MCR    Authorization Time Period FOTO at DC    PT Start Time 1135    PT Stop Time 1216    PT Time Calculation (min) 41 min    Activity Tolerance Patient tolerated treatment well    Behavior During Therapy [facility] for tasks assessed/performed           Past Medical History:  Diagnosis Date  . Allergy   . Asthma   . Cardiomyopathy    Non-ischemic  . Diabetes mellitus without complication ([place])   . Herpes simplex of female genitalia   . Hypertension   . Rheumatoid arthritis(714.0)     Past Surgical History:  Procedure Laterality Date  . DENTAL SURGERY    . OVARY SURGERY    . RIGHT HEART CATH N/A 01/18/2017   Procedure: RIGHT HEART CATH;  Surgeon: Larey Dresser, MD;  Location: [place] [facility];  Service: Cardiovascular;  Laterality: N/A;    There were no vitals filed for this visit.   Subjective Assessment - 06/17/20 1139    Subjective Patient returns, has not been in since 05/22/20. Had an injection and my pain is gone. She is still having issues with her arthritis though.  Feet, hands, knees, ankles.    Currently in Pain? No/denies              [facility] PT Assessment - 06/17/20 0001      Assessment   Medical Diagnosis Radiculopathy, lumbar region    Referring Provider (PT) Ward, Yvonne Kendall, PA-C    Onset Date/Surgical Date --   patient reports many year history of back pain   Next MD Visit Not scheduled    Prior Therapy None      Precautions   Precautions None      Restrictions   Weight  Bearing Restrictions No      Balance Screen   Has the patient fallen in the past 6 months No      [place] residence      Prior Function   Level of Independence Independent    Vocation Part time employment   4 hour shifts   Vocation Requirements Works for [employer], a lot of standing    Leisure Shopping      Cognition   Overall Cognitive Status Within Functional Limits for tasks assessed      AROM   Lumbar Flexion WNL    Lumbar Extension WNL    Lumbar - Right Side Bend WNL    Lumbar - Left Side Bend WNL    Lumbar - Right Rotation WNL    Lumbar - Left Rotation WNL      Strength   Right Hip Flexion 4-/5    Right Hip ABduction 4-/5    Left Hip Flexion 4-/5    Left Hip ABduction 4-/5    Right Knee Flexion 5/5    Right Knee Extension 5/5    Left Knee Flexion 5/5    Left  Knee Extension 5/5             [facility] Adult PT Treatment/Exercise - 06/17/20 0001      Lumbar Exercises: Stretches   Lower Trunk Rotation Limitations x 10 , 10 sec    Pelvic Tilt 10 reps      Lumbar Exercises: Aerobic   Nustep LE only L6 for 6 min      Lumbar Exercises: Seated   Sit to Stand 10 reps    Sit to Stand Limitations 2 sets , used 10 lbs KB      Lumbar Exercises: Supine   Clam 20 reps    Clam Limitations green band    Bent Knee Raise 10 reps    Bent Knee Raise Limitations green band    Bridge 10 reps    Bridge Limitations with green band      Lumbar Exercises: Sidelying   Clam Both;20 reps    Hip Abduction Both;10 reps    Hip Abduction Weights (lbs) 2 sets      Lumbar Exercises: Quadruped   Other Quadruped Lumbar Exercises childs pose x 3                    PT Short Term Goals - 02/27/20 1256      PT SHORT TERM GOAL #1   Title STG = LTG             PT Long Term Goals - 06/17/20 1156      PT LONG TERM GOAL #1   Title Patient will be I with final HEP to maintain progress from PT    Baseline 1 x per week    Time 6     Period Weeks    Status On-going    Target Date 07/29/20      PT LONG TERM GOAL #2   Title Patient will understand results of FOTO by 3rd visit    Time 3    Period Weeks    Status Achieved      PT LONG TERM GOAL #3   Title Patient will report improved functional status to >/= 64% on FOTO    Baseline FOTO today    Time 6    Period Weeks    Status On-going    Target Date 07/29/20      PT LONG TERM GOAL #4   Title Patient will demonstrate lumbar AROM grossly WFL and non painful to improve ability to do household tasks    Baseline met today    Time 6    Period Weeks    Status Achieved    Target Date 07/29/20      PT LONG TERM GOAL #5   Title Patient will demonstrate gross hip and core strength >/= 4/5 MMT to improve lifting ability    Baseline knees 5/5, weak in core , hips (4-/5 to 4/5)    Time 6    Period Weeks    Status On-going      Additional Long Term Goals   Additional Long Term Goals Yes      PT LONG TERM GOAL #6   Title Patient will be able to stand and walk >/= 45 minutes with </= 2/10 pain level to improve working ability    Baseline can now stand for 2.5 hours with no pain    Time 6    Period Weeks    Status New    Target Date 07/29/20      PT LONG  TERM GOAL #7   Title Patient will be able to lift 25 lbs from the floor to waist height to maximize safety at work    Baseline in the past she has had increased back pain with this    Period Weeks    Status New    Target Date 07/29/20                 Plan - 06/17/20 1145    Clinical Impression Statement Patient again, was unable to get an appt until today, almost 4 weeks after last visit.  She is much improved due to recent injection.  She has not had pain since then. I encourged her and educated that now she is feeling less pain she can gain some functional strength to prevent a relapse of pain .  She only does her HEP 1 x per week as she worries she will increase her pain.  FOTO score checked again  today, was 73% able, a drastic increase in function.    Personal Factors and Comorbidities Fitness;Time since onset of injury/illness/exacerbation;Past/Current Experience    Examination-Activity Limitations Locomotion Level;Sit;Squat;Stand;Lift;Bend    Examination-Participation Restrictions Occupation;Community Activity;Driving;Shop    PT Treatment/Interventions Iontophoresis 92m/ml Dexamethasone;ADLs/Self Care Home Management;Therapeutic exercise;Neuromuscular re-education;Passive range of motion;Therapeutic activities;Moist Heat;Functional mobility training;Manual techniques;Patient/family education    PT Next Visit Plan Review HEP and progress PRN, progress core/hip strengthening and lumbopelvic control, manual/stretching for lumbar and hips, modalities PRN    PT Home Exercise Plan [id number]: piriformis stretch, LTR, thomas stretch, child's pose stretch, supine pelvic tilt, bridge, supine clam and marching with red. Mdifed for comfort .           Patient will benefit from skilled therapeutic intervention in order to improve the following deficits and impairments:  Decreased range of motion,Decreased activity tolerance,Pain,Postural dysfunction,Decreased strength,Improper body mechanics,Impaired flexibility  Visit Diagnosis: Chronic bilateral low back pain, unspecified whether sciatica present  Muscle weakness (generalized)     Problem List Patient Active Problem List   Diagnosis Date Noted  . Diverticulitis of large intestine with abscess without bleeding   . Perforation of sigmoid colon due to diverticulitis 03/18/2018  . Abscess of sigmoid colon due to diverticulitis s/p perc drainage 03/15/2018 03/14/2018  . Chronic systolic heart failure ([place] 05/20/2017  . Alopecia areata 08/26/2016  . Palpitations 10/31/2014  . Non-ischemic cardiomyopathy ([place] 10/31/2014  . Pulmonary infiltrates 08/01/2013  . Cough 08/01/2013  . 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 Rheumatoid arthritis ([place] 05/24/2012  . Hypertension 03/03/2012  . GERD (gastroesophageal reflux disease) 03/03/2012  . Morbid obesity,BMI of 45.0-49.9, adult 03/03/2012  . Rheumatoid arthritis(714.0) 03/03/2012  . Diabetes mellitus, type 2 ([place] 03/03/2012  . Hypokalemia 03/03/2012  . Lupus ([place] 03/02/2012  . ASTHMA 06/04/2010    Kadyn Chovan 06/17/2020, 1:00 PM  [facility] [address]Phone: [phone]  Fax:  [phone] Name: Marilyn PARKISONMRN: [id number] of Birth: 101-Jun-1972 JRaeford Razor PT 06/17/20 1:01 PM Phone: [phone]: [phone]

## 2020-06-18 DIAGNOSIS — M0600 Rheumatoid arthritis without rheumatoid factor, unspecified site: Secondary | ICD-10-CM | POA: Diagnosis not present

## 2020-06-23 DIAGNOSIS — M069 Rheumatoid arthritis, unspecified: Secondary | ICD-10-CM | POA: Diagnosis not present

## 2020-06-23 DIAGNOSIS — M25571 Pain in right ankle and joints of right foot: Secondary | ICD-10-CM | POA: Diagnosis not present

## 2020-06-23 DIAGNOSIS — M25572 Pain in left ankle and joints of left foot: Secondary | ICD-10-CM | POA: Diagnosis not present

## 2020-06-24 ENCOUNTER — Ambulatory Visit: Payer: Medicare Other | Admitting: Physical Therapy

## 2020-06-24 ENCOUNTER — Other Ambulatory Visit: Payer: Self-pay

## 2020-06-24 DIAGNOSIS — M545 Low back pain, unspecified: Secondary | ICD-10-CM

## 2020-06-24 DIAGNOSIS — M6281 Muscle weakness (generalized): Secondary | ICD-10-CM | POA: Diagnosis not present

## 2020-06-24 DIAGNOSIS — G8929 Other chronic pain: Secondary | ICD-10-CM | POA: Diagnosis not present

## 2020-06-24 NOTE — Therapy (Signed)
[place], [place], [address] Phone: [phone]   Fax:  [phone]  Physical Therapy Treatment  Patient Details  Name: [name] MRN: [id number] Date of Birth: Jul 18, 1970 Referring Provider (PT): Ward, Yvonne Kendall, [place]   Encounter Date: 06/24/2020   PT End of Session - 06/24/20 1134    Visit Number 7    Number of Visits 11    Date for PT Re-Evaluation 07/03/20    Authorization Type UHC MCR    Authorization Time Period FOTO at DC    PT Start Time 1135    PT Stop Time 1215    PT Time Calculation (min) 40 min    Activity Tolerance Patient tolerated treatment well    Behavior During Therapy [facility] for tasks assessed/performed           Past Medical History:  Diagnosis Date  . Allergy   . Asthma   . Cardiomyopathy    Non-ischemic  . Diabetes mellitus without complication ([place])   . Herpes simplex of female genitalia   . Hypertension   . Rheumatoid arthritis(714.0)     Past Surgical History:  Procedure Laterality Date  . DENTAL SURGERY    . OVARY SURGERY    . RIGHT HEART CATH N/A 01/18/2017   Procedure: RIGHT HEART CATH;  Surgeon: Larey Dresser, MD;  Location: [place] [facility];  Service: Cardiovascular;  Laterality: N/A;    There were no vitals filed for this visit.   Subjective Assessment - 06/24/20 1136    Subjective Pt with pain in hands, ankle.  None in low back since injection (still).    Currently in Pain? No/denies               [facility] Adult PT Treatment/Exercise - 06/24/20 0001      Lumbar Exercises: Stretches   Lower Trunk Rotation 10 seconds    Lower Trunk Rotation Limitations x 10    Pelvic Tilt 10 reps    Lumbar Stabilization Level 1 Limitations march , clam and SLR x 10 each   green band     Lumbar Exercises: Aerobic   Recumbent Bike L2 for 5 min warm up      Lumbar Exercises: Standing   Other Standing Lumbar Exercises squat 15 lbs x 10 x 2 sets    Other  Standing Lumbar Exercises hip hinge x 2 sets x  15 lbs, 1 without wgt for form      Lumbar Exercises: Seated   Sit to Stand 10 reps    Sit to Stand Limitations 2 sets, 15 lbs      Lumbar Exercises: Supine   Bridge 15 reps    Bridge Limitations with green band    Straight Leg Raise 10 reps      Lumbar Exercises: Sidelying   Hip Abduction Both;10 reps    Hip Abduction Weights (lbs) 2 sets      Lumbar Exercises: Quadruped   Opposite Arm/Leg Raise 5 reps;Right arm/Left leg;Left arm/Right leg    Opposite Arm/Leg Raise Limitations close supervision on high table    Other Quadruped Lumbar Exercises childs pose x 3                  PT Education - 06/24/20 1204    Education Details lifting, squatting mechanics    Person(s) Educated Patient    Methods Explanation    Comprehension Verbalized understanding;Verbal cues required  PT Short Term Goals - 02/27/20 1256      PT SHORT TERM GOAL #1   Title STG = LTG             PT Long Term Goals - 06/17/20 1156      PT LONG TERM GOAL #1   Title Patient will be I with final HEP to maintain progress from PT    Baseline 1 x per week    Time 6    Period Weeks    Status On-going    Target Date 07/29/20      PT LONG TERM GOAL #2   Title Patient will understand results of FOTO by 3rd visit    Time 3    Period Weeks    Status Achieved      PT LONG TERM GOAL #3   Title Patient will report improved functional status to >/= 64% on FOTO    Baseline FOTO today    Time 6    Period Weeks    Status On-going    Target Date 07/29/20      PT LONG TERM GOAL #4   Title Patient will demonstrate lumbar AROM grossly WFL and non painful to improve ability to do household tasks    Baseline met today    Time 6    Period Weeks    Status Achieved    Target Date 07/29/20      PT LONG TERM GOAL #5   Title Patient will demonstrate gross hip and core strength >/= 4/5 MMT to improve lifting ability    Baseline knees 5/5, weak  in core , hips (4-/5 to 4/5)    Time 6    Period Weeks    Status On-going      Additional Long Term Goals   Additional Long Term Goals Yes      PT LONG TERM GOAL #6   Title Patient will be able to stand and walk >/= 45 minutes with </= 2/10 pain level to improve working ability    Baseline can now stand for 2.5 hours with no pain    Time 6    Period Weeks    Status New    Target Date 07/29/20      PT LONG TERM GOAL #7   Title Patient will be able to lift 25 lbs from the floor to waist height to maximize safety at work    Baseline in the past she has had increased back pain with this    Period Weeks    Status New    Target Date 07/29/20                 Plan - 06/24/20 1158    Clinical Impression Statement Patient contnues to have no complaint of back pain , depsite working a "double" yesterday (8 hours). She was able to demo decent form with squats and lifting, needing min cues. She is agreeable to discharge after next visit if she continues to have no pain. Poor core strength when unsupported /quaduped .    PT Treatment/Interventions Iontophoresis 4mg /ml Dexamethasone;ADLs/Self Care Home Management;Therapeutic exercise;Neuromuscular re-education;Passive range of motion;Therapeutic activities;Moist Heat;Functional mobility training;Manual techniques;Patient/family education    PT Next Visit Plan DC? has more appts scheduled. progress HEP if needed    PT Home Exercise Plan [email or web address]: piriformis stretch, LTR, thomas stretch, child's pose stretch, supine pelvic tilt, bridge, supine clam and marching with red. Mdifed for comfort .    Consulted and Agree with Plan  of Care Patient           Patient will benefit from skilled therapeutic intervention in order to improve the following deficits and impairments:  Decreased range of motion,Decreased activity tolerance,Pain,Postural dysfunction,Decreased strength,Improper body mechanics,Impaired flexibility  Visit Diagnosis: Chronic  bilateral low back pain, unspecified whether sciatica present  Muscle weakness (generalized)     Problem List Patient Active Problem List   Diagnosis Date Noted  . Diverticulitis of large intestine with abscess without bleeding   . Perforation of sigmoid colon due to diverticulitis 03/18/2018  . Abscess of sigmoid colon due to diverticulitis s/p perc drainage 03/15/2018 03/14/2018  . Chronic systolic heart failure ([place]) 05/20/2017  . Alopecia areata 08/26/2016  . Palpitations 10/31/2014  . Non-ischemic cardiomyopathy ([place]) 10/31/2014  . Pulmonary infiltrates 08/01/2013  . Cough 08/01/2013  . 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 Rheumatoid arthritis ([place]) 05/24/2012  . Hypertension 03/03/2012  . GERD (gastroesophageal reflux disease) 03/03/2012  . Morbid obesity,BMI of 45.0-49.9, adult 03/03/2012  . Rheumatoid arthritis(714.0) 03/03/2012  . Diabetes mellitus, type 2 ([place]) 03/03/2012  . Hypokalemia 03/03/2012  . Lupus ([place]) 03/02/2012  . ASTHMA 06/04/2010    Shayan Bramhall 06/24/2020, 12:17 PM  [facility] [address] [place], [place], [address] Phone: [phone]   Fax:  [phone]  Name: ZARIELLE CEA MRN: [id number] Date of Birth: November 25, 1970  Raeford Razor, PT 06/24/20 12:17 PM Phone: [phone] Fax: [phone]

## 2020-06-30 DIAGNOSIS — L668 Other cicatricial alopecia: Secondary | ICD-10-CM | POA: Diagnosis not present

## 2020-07-01 ENCOUNTER — Encounter: Payer: Self-pay | Admitting: Physical Therapy

## 2020-07-01 ENCOUNTER — Other Ambulatory Visit: Payer: Self-pay

## 2020-07-01 ENCOUNTER — Ambulatory Visit: Payer: Medicare Other | Admitting: Physical Therapy

## 2020-07-01 DIAGNOSIS — M545 Low back pain, unspecified: Secondary | ICD-10-CM

## 2020-07-01 DIAGNOSIS — M6281 Muscle weakness (generalized): Secondary | ICD-10-CM

## 2020-07-01 DIAGNOSIS — G8929 Other chronic pain: Secondary | ICD-10-CM | POA: Diagnosis not present

## 2020-07-01 NOTE — Therapy (Signed)
[place], [place], [address] Phone: [phone]   Fax:  [phone]  Physical Therapy Treatment / Discharge  Patient Details  Name: [name] MRN: [id number] Date of Birth: [birth year] Referring Provider (PT): Ward, Yvonne Kendall, [place]   Encounter Date: 07/01/2020   PT End of Session - 07/01/20 1140    Visit Number 8    Number of Visits 11    Date for PT Re-Evaluation 07/03/20    Authorization Type UHC MCR    Progress Note Due on Visit 10    PT Start Time 2703    PT Stop Time 1214    PT Time Calculation (min) 39 min    Activity Tolerance Patient tolerated treatment well    Behavior During Therapy [facility] for tasks assessed/performed           Past Medical History:  Diagnosis Date  . Allergy   . Asthma   . Cardiomyopathy    Non-ischemic  . Diabetes mellitus without complication ([place])   . Herpes simplex of female genitalia   . Hypertension   . Rheumatoid arthritis(714.0)     Past Surgical History:  Procedure Laterality Date  . DENTAL SURGERY    . OVARY SURGERY    . RIGHT HEART CATH N/A 01/18/2017   Procedure: RIGHT HEART CATH;  Surgeon: Larey Dresser, MD;  Location: [place] [facility];  Service: Cardiovascular;  Laterality: N/A;    There were no vitals filed for this visit.   Subjective Assessment - 07/01/20 1137    Subjective Patient things are still going "really well" and she is not having any back pain. She feels ready to be discharged from PT and is confident with her exercises.    How long can you stand comfortably? 4 hours with 15 minute break at work    How long can you walk comfortably? 4 hours with 15 minute break at work    Patient Stated Goals Improve back pain and avoid future episodes of sciatic pain    Currently in Pain? No/denies              [facility] PT Assessment - 07/01/20 0001      Assessment   Medical Diagnosis Radiculopathy, lumbar region    Referring Provider  (PT) Ward, Yvonne Kendall, PA-C      Precautions   Precautions None      Restrictions   Weight Bearing Restrictions No      Balance Screen   Has the patient fallen in the past 6 months No      Functional Tests   Functional tests Other      Other:   Other/ Comments Lifting 25 lbs from floor, patient able to perform with proper lifting mechanics maintaining neutral back and bending at hips/knees in hinge position, no pain reported with lifting      AROM   Overall AROM Comments All lumbar  [facility]      Strength   Right Hip Flexion 4/5    Right Hip Extension 4/5    Right Hip ABduction 4/5    Left Hip Flexion 4/5    Left Hip Extension 4/5    Left Hip ABduction 4/5                         [facility] Adult PT Treatment/Exercise - 07/01/20 0001      Self-Care   Self-Care Other Self-Care Comments    Other  Self-Care Comments  Exam findings and POC, FOTO      Exercises   Exercises Lumbar      Lumbar Exercises: Stretches   Lower Trunk Rotation 5 reps;10 seconds    Hip Flexor Stretch 30 seconds    Hip Flexor Stretch Limitations thomas and standing    Other Lumbar Stretch Exercise Seated figure-4 push/pull x 30 sec each    Other Lumbar Stretch Exercise Child's pose stretch x 30 sec      Lumbar Exercises: Aerobic   Nustep L5 x 5 min with LE      Lumbar Exercises: Standing   Lifting 10 reps    Lifting Weights (lbs) 25    Lifting Limitations lifting from floor with proper form      Lumbar Exercises: Supine   Pelvic Tilt 10 reps;5 seconds    Clam 15 reps    Clam Limitations unilateral green band    Bent Knee Raise 20 reps    Bent Knee Raise Limitations green band    Bridge 10 reps;3 seconds                  PT Education - 07/01/20 1140    Education Details POC discharge, HEP    Person(s) Educated Patient    Methods Explanation    Comprehension Verbalized understanding            PT Short Term Goals - 02/27/20 1256      PT SHORT TERM GOAL #1   Title  STG = LTG             PT Long Term Goals - 07/01/20 1146      PT LONG TERM GOAL #1   Title Patient will be I with final HEP to maintain progress from PT    Baseline Patient exhibits independence    Time 6    Period Weeks    Status Achieved      PT LONG TERM GOAL #2   Title Patient will understand results of FOTO by 3rd visit    Baseline Previously met    Time 3    Period Weeks    Status Achieved      PT LONG TERM GOAL #3   Title Patient will report improved functional status to >/= 64% on FOTO    Baseline 94%    Time 6    Period Weeks    Status Achieved      PT LONG TERM GOAL #4   Title Patient will demonstrate lumbar AROM grossly WFL and non painful to improve ability to do household tasks    Baseline Previously met    Time 6    Period Weeks    Status Achieved      PT LONG TERM GOAL #5   Title Patient will demonstrate gross hip and core strength >/= 4/5 MMT to improve lifting ability    Baseline Core and hip strength grossly 4/5 MMT    Time 6    Period Weeks    Status Achieved      PT LONG TERM GOAL #6   Title Patient will be able to stand and walk >/= 45 minutes with </= 2/10 pain level to improve working ability    Baseline Patient reports 4 hours with 15 min break    Time 6    Period Weeks    Status Achieved      PT LONG TERM GOAL #7   Title Patient will be able to lift 25  lbs from the floor to waist height to maximize safety at work    Baseline Patient able to lift 25lbs with proper form and no pain    Period Weeks    Status Achieved                 Plan - 07/01/20 1141    Clinical Impression Statement Patient continues to report no pain or limitation related to lower back pain. She has been able to work with limitation and is independent with HEP. Patient has achieved all established PT goals and will be discharged from therapy.    PT Home Exercise Plan [email or web address]: piriformis stretch, LTR, thomas stretch, child's pose stretch, supine pelvic  tilt, bridge, supine clam and marching with red. Mdifed for comfort .    Consulted and Agree with Plan of Care Patient           Patient will benefit from skilled therapeutic intervention in order to improve the following deficits and impairments: NA     Visit Diagnosis: Chronic bilateral low back pain, unspecified whether sciatica present  Muscle weakness (generalized)     Problem List Patient Active Problem List   Diagnosis Date Noted  . Diverticulitis of large intestine with abscess without bleeding   . Perforation of sigmoid colon due to diverticulitis 03/18/2018  . Abscess of sigmoid colon due to diverticulitis s/p perc drainage 03/15/2018 03/14/2018  . Chronic systolic heart failure ([place]) 05/20/2017  . Alopecia areata 08/26/2016  . Palpitations 10/31/2014  . Non-ischemic cardiomyopathy ([place]) 10/31/2014  . Pulmonary infiltrates 08/01/2013  . Cough 08/01/2013  . 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 Rheumatoid arthritis ([place]) 05/24/2012  . Hypertension 03/03/2012  . GERD (gastroesophageal reflux disease) 03/03/2012  . Morbid obesity,BMI of 45.0-49.9, adult 03/03/2012  . Rheumatoid arthritis(714.0) 03/03/2012  . Diabetes mellitus, type 2 ([place]) 03/03/2012  . Hypokalemia 03/03/2012  . Lupus ([place]) 03/02/2012  . ASTHMA 06/04/2010    Hilda Blades, PT, DPT, LAT, ATC 07/01/20  12:19 PM Phone: [phone] Fax: [place], [place], [address] Phone: [phone]   Fax:  [phone]  Name: [name] MRN: [id number] Date of Birth: 06/22/1970   PHYSICAL THERAPY DISCHARGE SUMMARY  Visits from Start of Care: 8  Current functional level related to goals / functional outcomes: See above   Remaining deficits: See above   Education / Equipment: HEP Plan: Patient agrees to discharge.  Patient goals were met.  Patient is being discharged due to meeting the stated rehab goals.  ?????

## 2020-07-01 NOTE — Patient Instructions (Signed)
Access Code: [email or web address] URL: [email or web address] Date: 07/01/2020 Prepared by: Rosana Hoes  Exercises Seated Figure 4 Piriformis Stretch - 2 x daily - 7 x weekly - 2 reps - 30 seconds hold Supine Lower Trunk Rotation - 2 x daily - 7 x weekly - 5 reps - 10 seconds hold Thomas Stretch - 2 x daily - 7 x weekly - 2 reps - 30 seconds hold Supine Posterior Pelvic Tilt - 1 x daily - 7 x weekly - 10 reps - 5 seconds hold Bridge - 1 x daily - 7 x weekly - 2 sets - 10 reps - 3 seconds hold Child's Pose Stretch - 2 x daily - 7 x weekly - 2 reps - 30 seconds hold Hooklying Isometric Clamshell - 1 x daily - 7 x weekly - 2 sets - 10 reps Supine March with Resistance Band - 1 x daily - 7 x weekly - 2 sets - 10 reps Standing Hip Flexor Stretch - 2 x daily - 7 x weekly - 2 reps - 30 hold

## 2020-07-08 ENCOUNTER — Encounter: Payer: Medicare Other | Admitting: Physical Therapy

## 2020-07-15 ENCOUNTER — Encounter: Payer: Medicare Other | Admitting: Physical Therapy

## 2020-07-22 DIAGNOSIS — Z79899 Other long term (current) drug therapy: Secondary | ICD-10-CM | POA: Diagnosis not present

## 2020-07-22 DIAGNOSIS — M0600 Rheumatoid arthritis without rheumatoid factor, unspecified site: Secondary | ICD-10-CM | POA: Diagnosis not present

## 2020-07-30 DIAGNOSIS — M0600 Rheumatoid arthritis without rheumatoid factor, unspecified site: Secondary | ICD-10-CM | POA: Diagnosis not present

## 2020-08-01 ENCOUNTER — Other Ambulatory Visit: Payer: Medicare Other

## 2020-08-01 DIAGNOSIS — H9203 Otalgia, bilateral: Secondary | ICD-10-CM | POA: Diagnosis not present

## 2020-08-06 DIAGNOSIS — I10 Essential (primary) hypertension: Secondary | ICD-10-CM | POA: Diagnosis not present

## 2020-08-06 DIAGNOSIS — E785 Hyperlipidemia, unspecified: Secondary | ICD-10-CM | POA: Diagnosis not present

## 2020-08-06 DIAGNOSIS — L83 Acanthosis nigricans: Secondary | ICD-10-CM | POA: Diagnosis not present

## 2020-08-06 DIAGNOSIS — E11620 Type 2 diabetes mellitus with diabetic dermatitis: Secondary | ICD-10-CM | POA: Diagnosis not present

## 2020-08-06 DIAGNOSIS — M79604 Pain in right leg: Secondary | ICD-10-CM | POA: Diagnosis not present

## 2020-08-06 DIAGNOSIS — I83811 Varicose veins of right lower extremities with pain: Secondary | ICD-10-CM | POA: Diagnosis not present

## 2020-08-06 DIAGNOSIS — S29011A Strain of muscle and tendon of front wall of thorax, initial encounter: Secondary | ICD-10-CM | POA: Diagnosis not present

## 2020-08-08 DIAGNOSIS — M79601 Pain in right arm: Secondary | ICD-10-CM | POA: Diagnosis not present

## 2020-08-08 DIAGNOSIS — M2550 Pain in unspecified joint: Secondary | ICD-10-CM | POA: Diagnosis not present

## 2020-08-08 DIAGNOSIS — M79602 Pain in left arm: Secondary | ICD-10-CM | POA: Diagnosis not present

## 2020-08-08 DIAGNOSIS — Z79899 Other long term (current) drug therapy: Secondary | ICD-10-CM | POA: Diagnosis not present

## 2020-08-08 DIAGNOSIS — M542 Cervicalgia: Secondary | ICD-10-CM | POA: Diagnosis not present

## 2020-08-15 ENCOUNTER — Ambulatory Visit
Admission: RE | Admit: 2020-08-15 | Discharge: 2020-08-15 | Disposition: A | Discharge status: Home / Self Care | Payer: Medicare Other | Source: Ambulatory Visit | Attending: Family Medicine | Admitting: Family Medicine

## 2020-08-15 ENCOUNTER — Other Ambulatory Visit: Payer: Self-pay

## 2020-08-15 DIAGNOSIS — M545 Low back pain, unspecified: Secondary | ICD-10-CM | POA: Diagnosis not present

## 2020-08-15 DIAGNOSIS — M79605 Pain in left leg: Secondary | ICD-10-CM

## 2020-08-15 DIAGNOSIS — M79604 Pain in right leg: Secondary | ICD-10-CM

## 2020-08-19 DIAGNOSIS — L649 Androgenic alopecia, unspecified: Secondary | ICD-10-CM | POA: Diagnosis not present

## 2020-08-19 DIAGNOSIS — L668 Other cicatricial alopecia: Secondary | ICD-10-CM | POA: Diagnosis not present

## 2020-09-08 ENCOUNTER — Other Ambulatory Visit (HOSPITAL_COMMUNITY): Payer: Self-pay | Admitting: Cardiology

## 2020-09-10 ENCOUNTER — Emergency Department (HOSPITAL_BASED_OUTPATIENT_CLINIC_OR_DEPARTMENT_OTHER)
Admission: EM | Admit: 2020-09-10 | Discharge: 2020-09-10 | Disposition: A | Discharge status: Home / Self Care | Payer: Medicare Other | Attending: Emergency Medicine | Admitting: Emergency Medicine

## 2020-09-10 ENCOUNTER — Encounter (HOSPITAL_BASED_OUTPATIENT_CLINIC_OR_DEPARTMENT_OTHER): Payer: Self-pay | Admitting: *Deleted

## 2020-09-10 ENCOUNTER — Other Ambulatory Visit: Payer: Self-pay

## 2020-09-10 DIAGNOSIS — M549 Dorsalgia, unspecified: Secondary | ICD-10-CM | POA: Insufficient documentation

## 2020-09-10 DIAGNOSIS — T7840XA Allergy, unspecified, initial encounter: Secondary | ICD-10-CM

## 2020-09-10 DIAGNOSIS — I5022 Chronic systolic (congestive) heart failure: Secondary | ICD-10-CM | POA: Insufficient documentation

## 2020-09-10 DIAGNOSIS — R0602 Shortness of breath: Secondary | ICD-10-CM | POA: Insufficient documentation

## 2020-09-10 DIAGNOSIS — I110 Hypertensive heart disease with heart failure: Secondary | ICD-10-CM | POA: Insufficient documentation

## 2020-09-10 DIAGNOSIS — R110 Nausea: Secondary | ICD-10-CM | POA: Insufficient documentation

## 2020-09-10 DIAGNOSIS — Z79899 Other long term (current) drug therapy: Secondary | ICD-10-CM | POA: Insufficient documentation

## 2020-09-10 DIAGNOSIS — Z7984 Long term (current) use of oral hypoglycemic drugs: Secondary | ICD-10-CM | POA: Diagnosis not present

## 2020-09-10 DIAGNOSIS — T50995A Adverse effect of other drugs, medicaments and biological substances, initial encounter: Secondary | ICD-10-CM | POA: Diagnosis not present

## 2020-09-10 DIAGNOSIS — E119 Type 2 diabetes mellitus without complications: Secondary | ICD-10-CM | POA: Insufficient documentation

## 2020-09-10 DIAGNOSIS — M0600 Rheumatoid arthritis without rheumatoid factor, unspecified site: Secondary | ICD-10-CM | POA: Diagnosis not present

## 2020-09-10 DIAGNOSIS — J45909 Unspecified asthma, uncomplicated: Secondary | ICD-10-CM | POA: Diagnosis not present

## 2020-09-10 MED ORDER — PROMETHAZINE HCL 25 MG PO TABS
25.0000 mg | ORAL_TABLET | Freq: Once | ORAL | Status: AC
  Administered 2020-09-10: 25 mg via ORAL
  Filled 2020-09-10: qty 1

## 2020-09-10 MED ORDER — DIPHENHYDRAMINE HCL 25 MG PO CAPS
50.0000 mg | ORAL_CAPSULE | Freq: Once | ORAL | Status: AC
  Administered 2020-09-10: 50 mg via ORAL
  Filled 2020-09-10: qty 2

## 2020-09-10 NOTE — ED Provider Notes (Signed)
[facility] EMERGENCY DEPARTMENT Provider Note   CSN: [id number] Arrival date & time: 09/10/20  1628     History Chief Complaint  Patient presents with  . Allergic Reaction    [name] is a 50 y.o. female presents to the emergency department with a chief complaint of back pain, nausea, and shortness of breath.  Patient reports that the symptoms started after receiving a infliximab infusion for her rheumatoid arthritis.  Patient is infusion ran from 11 [phone].  Patient states that her shortness of breath felt like she could not breathe.  Patient reports her entire back was in pain.  Patient endorses nausea without vomiting.  Patient had no facial swelling, rash, diarrhea, trouble swallowing, drooling.  Patient reports that her symptoms gradually improved over time.  Patient states that her symptoms at present are greatly improved.  Per chart review patient received infusion of INFLECTRA (infliximab).  Has received this medication infusion previously without reaction.  Patient reports that after her last infusion which included a increased dose she had similar back pain as to today.  Patient reports that her back pain resolved after waiting at infusion clinic for 30 minutes without any further intervention.  HPI     Past Medical History:  Diagnosis Date  . Allergy   . Asthma   . Cardiomyopathy    Non-ischemic  . Diabetes mellitus without complication (HCC)   . Herpes simplex of female genitalia   . Hypertension   . Rheumatoid arthritis(714.0)     Patient Active Problem List   Diagnosis Date Noted  . Diverticulitis of large intestine with abscess without bleeding   . Perforation of sigmoid colon due to diverticulitis 03/18/2018  . Abscess of sigmoid colon due to diverticulitis s/p perc drainage 03/15/2018 03/14/2018  . Chronic systolic heart failure (HCC) 05/20/2017  . Alopecia areata 08/26/2016  . Palpitations 10/31/2014  . Non-ischemic cardiomyopathy (HCC)  10/31/2014  . Pulmonary infiltrates 08/01/2013  . Cough 08/01/2013  . 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 Rheumatoid arthritis (HCC) 05/24/2012  . Hypertension 03/03/2012  . GERD (gastroesophageal reflux disease) 03/03/2012  . Morbid obesity,BMI of 45.0-49.9, adult 03/03/2012  . Rheumatoid arthritis(714.0) 03/03/2012  . Diabetes mellitus, type 2 (HCC) 03/03/2012  . Hypokalemia 03/03/2012  . Lupus (HCC) 03/02/2012  . ASTHMA 06/04/2010    Past Surgical History:  Procedure Laterality Date  . DENTAL SURGERY    . OVARY SURGERY    . RIGHT HEART CATH N/A 01/18/2017   Procedure: RIGHT HEART CATH;  Surgeon: Laurey Morale, MD;  Location: [facility] INVASIVE CV LAB;  Service: Cardiovascular;  Laterality: N/A;     OB History   No obstetric history on file.     Family History  Problem Relation Age of Onset  . Heart attack Mother   . CAD Mother   . Allergies Mother   . Breast cancer Mother   . Hypertension Other   . Diabetes Mellitus II Other     Social History   Tobacco Use  . Smoking status: Never Smoker  . Smokeless tobacco: Never Used  Vaping Use  . Vaping Use: Never used  Substance Use Topics  . Alcohol use: No  . Drug use: No    Home Medications Prior to Admission medications   Medication Sig Start Date End Date Taking? Authorizing Provider  acetaminophen-codeine (TYLENOL #3) 300-30 MG tablet Take by mouth every 8 (eight) hours as needed for moderate pain.  [provider]  albuterol (PROAIR HFA) 108 (90 BASE) MCG/ACT inhaler Inhale 2 puffs into the lungs every 6 (six) hours as needed for wheezing or shortness of breath.    [provider]  albuterol (PROVENTIL) (2.5 MG/3ML) 0.083% nebulizer solution Take 2.5 mg by nebulization every 6 (six) hours as needed for wheezing or shortness of breath.    [provider]  betamethasone dipropionate (DIPROLENE) 0.05 % ointment Apply BID  every other day to scalp. Avoid face, groin, underarms 05/03/19   [provider]  BIDIL 20-37.5 MG tablet TAKE 2 TABLETS BY MOUTH THREE TIMES DAILY 08/08/19   Laurey Morale, MD  clotrimazole (LOTRIMIN) 1 % cream Apply 1 application topically 2 (two) times daily. 03/13/18   [provider]  dapagliflozin propanediol (FARXIGA) 10 MG TABS tablet Take 1 tablet (10 mg total) by mouth daily before breakfast. 12/13/19   Allayne Butcher, PA-C  ENTRESTO 97-103 MG Take 1 tablet by mouth twice daily 08/08/19   Laurey Morale, MD  ferrous gluconate (FERGON) 324 MG tablet Take 324 mg by mouth daily.    [provider]  Fluocinolone Acetonide Scalp 0.01 % OIL  04/26/19   [provider]  folic acid (FOLVITE) 1 MG tablet Take 1 mg by mouth daily.    [provider]  furosemide (LASIX) 40 MG tablet Take 3 tablets (120 mg total) by mouth every morning AND 2 tablets (80 mg total) every evening. 09/12/19   Laurey Morale, MD  hydroxychloroquine (PLAQUENIL) 200 MG tablet Take 2 tablets (400 mg total) by mouth daily. 04/06/18   Rodolph Bong, MD  leflunomide (ARAVA) 20 MG tablet Take 20 mg by mouth daily. 03/23/19   [provider]  levocetirizine (XYZAL) 5 MG tablet Take 5 mg by mouth at bedtime.     [provider]  metFORMIN (GLUCOPHAGE) 500 MG tablet Take 1,000 mg by mouth 2 (two) times daily with a meal.     [provider]  methocarbamol (ROBAXIN) 500 MG tablet Take 500 mg by mouth 2 (two) times daily as needed (for muscle spasms.).     [provider]  metoprolol succinate (TOPROL-XL) 100 MG 24 hr tablet TAKE 1 TABLET BY MOUTH TWICE DAILY TAKE  WITH  OR  IMMEDIATELY  FOLLOWING  A  MEAL 11/15/19   Laurey Morale, MD  omeprazole (PRILOSEC) 40 MG capsule Take 1 capsule (40 mg total) by mouth daily before breakfast. 03/28/18   Rodolph Bong, MD  oxyCODONE-acetaminophen (PERCOCET/ROXICET) 5-325 MG tablet Take 1-2 tablets by  mouth every 6 (six) hours as needed for severe pain. 01/02/20   Rolan Bucco, MD  potassium chloride SA (KLOR-CON) 20 MEQ tablet Take 1 tablet (20 mEq total) by mouth daily. 07/31/19   Little, Ambrose Finland, MD  predniSONE (DELTASONE) 5 MG tablet Take 5 mg by mouth daily. 03/23/19   [provider]  spironolactone (ALDACTONE) 25 MG tablet Take 1 tablet by mouth once daily 09/10/20   Laurey Morale, MD  [facility] 160-4.5 MCG/ACT inhaler Inhale 2 puffs into the lungs daily as needed (for respiratory issues.).  06/01/13   [provider]  traMADol (ULTRAM) 50 MG tablet Take 50 mg by mouth every 4 (four) hours as needed for moderate pain or severe pain.    [provider]  valACYclovir (VALTREX) 500 MG tablet Take 500 mg by mouth 2 (two) times daily as needed (for cold sores.).    [provider]  Allergies    Clarithromycin, Hydrocodone, Ondansetron, Penicillins, and Tdap [tetanus-diphth-acell pertussis]  Review of Systems   Review of Systems  HENT: Negative for drooling, facial swelling, trouble swallowing and voice change.   Respiratory: Positive for shortness of breath.   Gastrointestinal: Positive for nausea. Negative for abdominal pain, diarrhea and vomiting.  Musculoskeletal: Positive for back pain.  Skin: Negative for rash.  Psychiatric/Behavioral: Negative for confusion.    Physical Exam Updated Vital Signs BP 121/81   Pulse 100   Temp 99.2 F (37.3 C) (Oral)   Resp 18   Ht 5\' 2"  (1.575 m)   Wt 100.7 kg   LMP 08/10/2019 (Exact Date)   SpO2 100%   BMI 40.60 kg/m   Physical Exam Vitals and nursing note reviewed.  Constitutional:      General: She is not in acute distress.    Appearance: She is not ill-appearing, toxic-appearing or diaphoretic.  HENT:     Head: Normocephalic. No right periorbital erythema or left periorbital erythema.     Jaw: No trismus or pain on movement.     Mouth/Throat:     Mouth: Mucous membranes are moist. No  angioedema.     Pharynx: Oropharynx is clear. Uvula midline. No pharyngeal swelling, oropharyngeal exudate, posterior oropharyngeal erythema or uvula swelling.     Tonsils: No tonsillar exudate or tonsillar abscesses. 1+ on the right. 1+ on the left.     Comments: Patient able to handle oral secretions without difficulty Eyes:     General: No scleral icterus.       Right eye: No discharge.        Left eye: No discharge.  Cardiovascular:     Rate and Rhythm: Normal rate.  Pulmonary:     Effort: Pulmonary effort is normal. No tachypnea, bradypnea or respiratory distress.     Breath sounds: No stridor.     Comments: Lungs clear to auscultation bilaterally.  Patient able to speak in full complete sentences without difficulty. Abdominal:     General: There is no distension. There are no signs of injury.     Palpations: There is pulsatile mass. There is no mass.     Tenderness: There is no guarding or rebound.  Musculoskeletal:     Cervical back: Normal range of motion and neck supple.  Skin:    General: Skin is warm and dry.     Coloration: Skin is not cyanotic or pale.     Findings: No rash. Rash is not urticarial.  Neurological:     General: No focal deficit present.     Mental Status: She is alert.     GCS: GCS eye subscore is 4. GCS verbal subscore is 5. GCS motor subscore is 6.  Psychiatric:        Behavior: Behavior is cooperative.     ED Results / Procedures / Treatments   Labs (all labs ordered are listed, but only abnormal results are displayed) Labs Reviewed - No data to display  EKG None  Radiology No results found.  Procedures Procedures   Medications Ordered in ED Medications - No data to display  ED Course  I have reviewed the triage vital signs and the nursing notes.  Pertinent labs & imaging results that were available during my care of the patient were reviewed by me and considered in my medical decision making (see chart for details).    MDM  Rules/Calculators/A&P  Alert 50 year old female presents emergency department chief complaints of back pain, shortness of breath, and nausea after receiving infliximab infusion.  Patient has had this infusion in the past.  Patient reports that after her last infusion she had similar pain that resolved spontaneously.  Patient reports that her symptoms gradually improved on their own without taking any medication.  On physical exam patient able to speak in full complete sentences without difficulty.  Patient able to handle oral secretions without difficulty.  Lungs clear to auscultation without stridor.  Abdomen soft, nondistended, nontender.  No rash or hives observed.  Patient has no swelling to oropharynx, face, or neck.  Will give patient Zofran to help with her nausea and Benadryl for possible allergic reaction.  Patient reports resolution of her symptoms and is requesting to leave the emergency department.  Patient continues to have no difficulty handling oral secretions or speaking in full complete sentences.  Patient hemodynamically stable.  Will discharge patient.  Patient given instructions to speak to provider before receiving any further infusions.  Patient given strict return precautions.  Patient expressed understanding of all instructions and is agreeable with this plan.  Final Clinical Impression(s) / ED Diagnoses Final diagnoses:  None    Rx / DC Orders ED Discharge Orders    None       Berneice Heinrich 09/10/20 2131    Jacalyn Lefevre, MD 09/10/20 2310

## 2020-09-10 NOTE — ED Triage Notes (Signed)
C/o chills fever , SOB after infusion for RA @ 1100, no resp distress in  triage

## 2020-09-10 NOTE — ED Notes (Signed)
Pt with verbal understanding of discharge paperwork. No further questions. No signature pad available.

## 2020-09-10 NOTE — Discharge Instructions (Signed)
You came to the emergency department today to be evaluated for your symptoms after receiving your infusion earlier today.  Her nausea and shortness of breath could have been due to a possible allergic reaction.  Your physical exam was very reassuring today.  You received Benadryl and antinausea medication.  Please speak with your vision before receiving any further infusions.    Get help right away if: You have symptoms of anaphylaxis. These include: Swollen mouth, tongue, or throat. Pain or tightness in your chest. Trouble breathing or shortness of breath. Dizziness or fainting. Severe abdominal pain, vomiting, or diarrhea.

## 2020-09-16 ENCOUNTER — Encounter (HOSPITAL_COMMUNITY): Payer: Self-pay

## 2020-09-16 ENCOUNTER — Other Ambulatory Visit: Payer: Self-pay

## 2020-09-16 ENCOUNTER — Ambulatory Visit (HOSPITAL_COMMUNITY)
Admission: RE | Admit: 2020-09-16 | Discharge: 2020-09-16 | Disposition: A | Discharge status: Home / Self Care | Payer: Medicare Other | Source: Ambulatory Visit | Attending: Cardiology | Admitting: Cardiology

## 2020-09-16 VITALS — BP 116/80 | HR 80 | Ht 62.0 in | Wt 218.2 lb

## 2020-09-16 DIAGNOSIS — M069 Rheumatoid arthritis, unspecified: Secondary | ICD-10-CM | POA: Diagnosis not present

## 2020-09-16 DIAGNOSIS — Z7984 Long term (current) use of oral hypoglycemic drugs: Secondary | ICD-10-CM | POA: Insufficient documentation

## 2020-09-16 DIAGNOSIS — Z79899 Other long term (current) drug therapy: Secondary | ICD-10-CM | POA: Insufficient documentation

## 2020-09-16 DIAGNOSIS — E669 Obesity, unspecified: Secondary | ICD-10-CM | POA: Diagnosis not present

## 2020-09-16 DIAGNOSIS — Z6839 Body mass index (BMI) 39.0-39.9, adult: Secondary | ICD-10-CM | POA: Insufficient documentation

## 2020-09-16 DIAGNOSIS — Z8249 Family history of ischemic heart disease and other diseases of the circulatory system: Secondary | ICD-10-CM | POA: Insufficient documentation

## 2020-09-16 DIAGNOSIS — Z7951 Long term (current) use of inhaled steroids: Secondary | ICD-10-CM | POA: Diagnosis not present

## 2020-09-16 DIAGNOSIS — I5022 Chronic systolic (congestive) heart failure: Secondary | ICD-10-CM | POA: Diagnosis not present

## 2020-09-16 DIAGNOSIS — E119 Type 2 diabetes mellitus without complications: Secondary | ICD-10-CM | POA: Diagnosis not present

## 2020-09-16 DIAGNOSIS — I110 Hypertensive heart disease with heart failure: Secondary | ICD-10-CM | POA: Diagnosis not present

## 2020-09-16 DIAGNOSIS — I428 Other cardiomyopathies: Secondary | ICD-10-CM | POA: Insufficient documentation

## 2020-09-16 NOTE — Addendum Note (Signed)
Encounter addended by: Marisa Hua, RN on: 09/16/2020 10:13 AM  Actions taken: Charge Capture section accepted, Order list changed, Diagnosis association updated

## 2020-09-16 NOTE — Patient Instructions (Signed)
No medication changes today!  Labs today We will only contact you if something comes back abnormal or we need to make some changes. Otherwise no news is good news!  Your physician recommends that you schedule a follow-up appointment in: 3-4 months with Dr Shirlee Latch.   Please call office at [phone] option 2 if you have any questions or concerns.    At the [facility], you and your health needs are our priority. As part of our continuing mission to provide you with exceptional heart care, we have created designated Provider Care Teams. These Care Teams include your primary Cardiologist (physician) and Advanced Practice Providers (APPs- Physician Assistants and Nurse Practitioners) who all work together to provide you with the care you need, when you need it.   You may see any of the following providers on your designated Care Team at your next follow up: Marland Kitchen Dr Arvilla Meres . Dr Marca Ancona . Dr Thornell Mule . Tonye Becket, NP . Robbie Lis, PA . Shanda Bumps [place],NP . Karle Plumber, PharmD   Please be sure to bring in all your medications bottles to every appointment.

## 2020-09-16 NOTE — Progress Notes (Signed)
[facility] Note   PCP: Dr. Hyman Hopes Cardiology: Dr. Clifton James HF Cardiology: Dr. Shirlee Latch  Reason for visit: Follow-up for chronic systolic heart failure  50 y.o. with history of nonischemic cardiomyopathy/chronic systolic CHF and rheumatoid arthritis was initially referred by Dr. Clifton James for evaluation/management of CHF.    Patient has had a cardiomyopathy known for a number of years now.  Coronary angiography in 2012 showed no significant disease.  EF had been in the 25-30% range long-term.  Most recent echo in 8/18 showed EF remains 25-30%.  She has no family history of cardiomyopathy and she has never been a heavy drinker.  She has not used illicit drugs.  RHC in 10/18 showed preserved cardiac output with mildly increased filling pressures.  Lasix was increased to 120 qam/80 qpm.  She was seen by EP (Camnitz) and decided against ICD.    Echo in 10/19 showed EF 40-45% with diffuse hypokinesis, normal RV.   Echo 2/21 showed EF 40-45%, diffuse hypokinesis.   She presents to clinic today for routine follow-up for chronic systolic heart failure. Was recently seen in ED last week for allergic reaction after receiving infliximab infusion for her rheumatoid arthritis. Had back pain, nausea and rash. Improved in the ED/ w/ pain meds, Zofran and benadryl. Otherwise, from a HF standpoint, she has been symptomatically doing ok. NYHA Class II. Denies wt gain. No orthopnea, PND or LEE. Reports full med compliance. Tolerating well w/o side effects. No GU symptoms w/ Jardiance. Denies CP.       Labs (9/18): K 4.4, creatinine 0.81 Labs (10/18): K 4, creatinine 0.88 Labs (1/19): K 3.9, creatinine 0.66 Labs (10/19): K 3.9, creatinine 0.67 Labs (12/19): creatinine 1.14 Labs (10/20): LDL 93, K 4.3, creatinine 3.61 Labs (4/21): K 2.7, creatinine 0.97 Labs (6/21): K 3.8, creatinine 0.92 Labs (4/22): 4.2, creatinine 0.73   ECG : Not performed today  PMH: 1. Rheumatoid arthritis: Followed  by Dr. Lendon Colonel.  2. Type II diabetes.  3. HTN 4. Chronic systolic CHF: Nonischemic cardiomyopathy.  - LHC (2012): No significant CAD.  - Echo (7/13): EF 30-35% - Cardiac MRI (12/14): Mild LV dilation, EF 40%, normal RV size and systolic function, no late gadolinium enhancement.  - Echo (7/16): EF 25-30% - Echo (8/18): EF 25-30% - RHC (10/18): mean RA 11, PA 47/20 mean 32, mean PCWP 16, CI 3.06 Fick/3.5 thermo, PVR 2.65 WU.  - Echo (10/19): EF 40-45%, diffuse hypokinesis, moderate diastolic dysfunction, normal RV size and systolic function  - Echo (2/21): EF 40-45%, diffuse hypokinesis.   SH: Lives with mother.  Rare ETOH, no drugs.  No smoking.   Family History  Problem Relation Age of Onset  . Heart attack Mother   . CAD Mother   . Allergies Mother   . Breast cancer Mother   . Hypertension Other   . Diabetes Mellitus II Other    ROS: All systems reviewed and negative except as per HPI.   Current Outpatient Medications  Medication Sig Dispense Refill  . albuterol (PROVENTIL) (2.5 MG/3ML) 0.083% nebulizer solution Take 2.5 mg by nebulization every 6 (six) hours as needed for wheezing or shortness of breath.    Marland Kitchen albuterol (VENTOLIN HFA) 108 (90 Base) MCG/ACT inhaler Inhale 2 puffs into the lungs every 6 (six) hours as needed for wheezing or shortness of breath.    . betamethasone dipropionate (DIPROLENE) 0.05 % ointment Apply BID every other day to scalp. Avoid face, groin, underarms    . BIDIL 20-37.5 MG  tablet TAKE 2 TABLETS BY MOUTH THREE TIMES DAILY 540 tablet 3  . dapagliflozin propanediol (FARXIGA) 10 MG TABS tablet Take 1 tablet (10 mg total) by mouth daily before breakfast. 30 tablet 11  . ENTRESTO 97-103 MG Take 1 tablet by mouth twice daily 180 tablet 3  . ferrous gluconate (FERGON) 324 MG tablet Take 324 mg by mouth daily.    . Fluocinolone Acetonide Scalp 0.01 % OIL     . folic acid (FOLVITE) 1 MG tablet Take 1 mg by mouth daily.    . furosemide (LASIX) 40 MG tablet Take  3 tablets (120 mg total) by mouth every morning AND 2 tablets (80 mg total) every evening. 150 tablet 5  . hydroxychloroquine (PLAQUENIL) 200 MG tablet Take 2 tablets (400 mg total) by mouth daily.    Marland Kitchen leflunomide (ARAVA) 20 MG tablet Take 20 mg by mouth daily.    Marland Kitchen levocetirizine (XYZAL) 5 MG tablet Take 5 mg by mouth at bedtime.    . metFORMIN (GLUCOPHAGE) 500 MG tablet Take 1,000 mg by mouth 2 (two) times daily with a meal.     . methocarbamol (ROBAXIN) 500 MG tablet Take 500 mg by mouth 2 (two) times daily as needed (for muscle spasms.).     Marland Kitchen metoprolol succinate (TOPROL-XL) 100 MG 24 hr tablet TAKE 1 TABLET BY MOUTH TWICE DAILY TAKE  WITH  OR  IMMEDIATELY  FOLLOWING  A  MEAL 180 tablet 3  . omeprazole (PRILOSEC) 40 MG capsule Take 1 capsule (40 mg total) by mouth daily before breakfast. 30 capsule 0  . oxyCODONE-acetaminophen (PERCOCET/ROXICET) 5-325 MG tablet Take 1-2 tablets by mouth every 6 (six) hours as needed for severe pain. 6 tablet 0  . potassium chloride SA (KLOR-CON) 20 MEQ tablet Take 1 tablet (20 mEq total) by mouth daily. 7 tablet 0  . spironolactone (ALDACTONE) 25 MG tablet Take 1 tablet by mouth once daily 90 tablet 0  . SYMBICORT 160-4.5 MCG/ACT inhaler Inhale 2 puffs into the lungs daily as needed (for respiratory issues.).     Marland Kitchen traMADol (ULTRAM) 50 MG tablet Take 50 mg by mouth every 4 (four) hours as needed for moderate pain or severe pain.    . valACYclovir (VALTREX) 500 MG tablet Take 500 mg by mouth 2 (two) times daily as needed (for cold sores.).    Marland Kitchen acetaminophen-codeine (TYLENOL #3) 300-30 MG tablet Take by mouth every 8 (eight) hours as needed for moderate pain. (Patient not taking: Reported on 09/16/2020)    . clotrimazole (LOTRIMIN) 1 % cream Apply 1 application topically 2 (two) times daily. (Patient not taking: Reported on 09/16/2020)     No current facility-administered medications for this encounter.   BP 116/80   Pulse 80   Ht 5\' 2"  (1.575 m)   Wt 99 kg  (218 lb 3.2 oz)   LMP 08/10/2019 (Exact Date)   SpO2 100%   BMI 39.91 kg/m    PHYSICAL EXAM: General:  Well appearing, moderately obese. No respiratory difficulty HEENT: normal Neck: supple. no JVD. Carotids 2+ bilat; no bruits. No lymphadenopathy or thyromegaly appreciated. Cor: PMI nondisplaced. Regular rate & rhythm. No rubs, gallops or murmurs. Lungs: clear Abdomen: soft, nontender, nondistended. No hepatosplenomegaly. No bruits or masses. Good bowel sounds. Extremities: no cyanosis, clubbing, rash, edema Neuro: alert & oriented x 3, cranial nerves grossly intact. moves all 4 extremities w/o difficulty. Affect pleasant.     Assessment/Plan: 1. Chronic systolic CHF: Nonischemic cardiomyopathy.  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  CMRI in 12/14 with no LGE noted.  No heavy drinking or drugs.  No history of familial cardiomyopathy.  Echo in 2/21 showed stable EF 40-45%. NYHA Class II. Euvolemic on exam. BP well controlled.  - Continue Lasix 120 qam/80 qpm.   - Continue spironolactone 25 mg daily  - Continue Toprol XL 100 mg bid.   - Continue Bidil 2 tabs tid.   - Continue Entresto 97/103 bid.    - Continue dapagliflozin 10 mg daily.  - Check BMP today  - Discussed daily wts + low sodium diet   2. Rheumatoid arthritis:  -Followed by rheumatology. Gets scheduled infliximab infusions -Pain controlled   3. Type 2 diabetes - On Metformin 1000 mg twice daily -  SGLT2i inhibitor for concomitant CHF, dapagliflozin 10 mg. No GU symptoms  4. Obesity  - Body mass index is 39.91 kg/m. - she acknowledged that she needs to loose wt. Encouraged increase in physical activity and portion control   F/u w/ Dr. Shirlee Latch in 3-4 months    Robbie Lis, PA-C 09/16/2020

## 2020-09-24 DIAGNOSIS — J0100 Acute maxillary sinusitis, unspecified: Secondary | ICD-10-CM | POA: Diagnosis not present

## 2020-09-24 DIAGNOSIS — M26609 Unspecified temporomandibular joint disorder, unspecified side: Secondary | ICD-10-CM | POA: Diagnosis not present

## 2020-09-26 ENCOUNTER — Other Ambulatory Visit: Payer: Self-pay

## 2020-09-26 ENCOUNTER — Ambulatory Visit (HOSPITAL_COMMUNITY)
Admission: RE | Admit: 2020-09-26 | Discharge: 2020-09-26 | Disposition: A | Discharge status: Home / Self Care | Payer: Medicare Other | Source: Ambulatory Visit | Attending: Internal Medicine | Admitting: Internal Medicine

## 2020-09-26 DIAGNOSIS — I5022 Chronic systolic (congestive) heart failure: Secondary | ICD-10-CM

## 2020-09-26 LAB — BASIC METABOLIC PANEL
Anion gap: 8 (ref 5–15)
BUN: 14 mg/dL (ref 6–20)
CO2: 23 mmol/L (ref 22–32)
Calcium: 9 mg/dL (ref 8.9–10.3)
Chloride: 107 mmol/L (ref 98–111)
Creatinine, Ser: 0.67 mg/dL (ref 0.44–1.00)
GFR, Estimated: >60 mL/min (ref >60)
Glucose, Bld: 253 mg/dL — ABNORMAL HIGH (ref 70–99)
Potassium: 3.6 mmol/L (ref 3.5–5.1)
Sodium: 138 mmol/L (ref 135–145)

## 2020-10-31 DIAGNOSIS — M5416 Radiculopathy, lumbar region: Secondary | ICD-10-CM | POA: Diagnosis not present

## 2020-12-02 DIAGNOSIS — L668 Other cicatricial alopecia: Secondary | ICD-10-CM | POA: Diagnosis not present

## 2020-12-08 DIAGNOSIS — M329 Systemic lupus erythematosus, unspecified: Secondary | ICD-10-CM | POA: Diagnosis not present

## 2020-12-08 DIAGNOSIS — M069 Rheumatoid arthritis, unspecified: Secondary | ICD-10-CM | POA: Diagnosis not present

## 2020-12-10 DIAGNOSIS — J45909 Unspecified asthma, uncomplicated: Secondary | ICD-10-CM | POA: Diagnosis not present

## 2020-12-11 ENCOUNTER — Telehealth (HOSPITAL_COMMUNITY): Payer: Self-pay

## 2020-12-11 ENCOUNTER — Telehealth (HOSPITAL_COMMUNITY): Payer: Self-pay | Admitting: Cardiology

## 2020-12-11 NOTE — Telephone Encounter (Signed)
Patient left voicemail on triage line with concerns of increased SOB  Returned call to discuss further-no answer [facility]

## 2020-12-11 NOTE — Telephone Encounter (Signed)
Called and left patient a detailed message of the below to confirm/remind patient of their appointment at the [facility] on 12/12/20.   Patient reminded to bring all medications and/or complete list.

## 2020-12-12 ENCOUNTER — Encounter (HOSPITAL_COMMUNITY): Payer: Medicare Other

## 2020-12-22 ENCOUNTER — Encounter (HOSPITAL_COMMUNITY): Payer: Self-pay | Admitting: Cardiology

## 2020-12-22 ENCOUNTER — Other Ambulatory Visit: Payer: Self-pay

## 2020-12-22 ENCOUNTER — Ambulatory Visit (HOSPITAL_COMMUNITY)
Admission: RE | Admit: 2020-12-22 | Discharge: 2020-12-22 | Disposition: A | Discharge status: Home / Self Care | Payer: Medicare Other | Source: Ambulatory Visit | Attending: Cardiology | Admitting: Cardiology

## 2020-12-22 VITALS — BP 122/88 | HR 86 | Wt 210.0 lb

## 2020-12-22 DIAGNOSIS — I428 Other cardiomyopathies: Secondary | ICD-10-CM | POA: Diagnosis not present

## 2020-12-22 DIAGNOSIS — Z7951 Long term (current) use of inhaled steroids: Secondary | ICD-10-CM | POA: Diagnosis not present

## 2020-12-22 DIAGNOSIS — I110 Hypertensive heart disease with heart failure: Secondary | ICD-10-CM | POA: Insufficient documentation

## 2020-12-22 DIAGNOSIS — Z7984 Long term (current) use of oral hypoglycemic drugs: Secondary | ICD-10-CM | POA: Diagnosis not present

## 2020-12-22 DIAGNOSIS — Z79899 Other long term (current) drug therapy: Secondary | ICD-10-CM | POA: Insufficient documentation

## 2020-12-22 DIAGNOSIS — E119 Type 2 diabetes mellitus without complications: Secondary | ICD-10-CM | POA: Diagnosis not present

## 2020-12-22 DIAGNOSIS — Z8249 Family history of ischemic heart disease and other diseases of the circulatory system: Secondary | ICD-10-CM | POA: Diagnosis not present

## 2020-12-22 DIAGNOSIS — I5022 Chronic systolic (congestive) heart failure: Secondary | ICD-10-CM | POA: Diagnosis not present

## 2020-12-22 DIAGNOSIS — M069 Rheumatoid arthritis, unspecified: Secondary | ICD-10-CM | POA: Insufficient documentation

## 2020-12-22 MED ORDER — FUROSEMIDE 40 MG PO TABS: 80.0000 mg | ORAL_TABLET | Freq: Two times a day (BID) | ORAL | 3 refills | Status: DC

## 2020-12-22 NOTE — Progress Notes (Signed)
PCP: Dr. Hyman Hopes Cardiology: Dr. Clifton James HF Cardiology: Dr. Shirlee Latch  50 y.o. with history of nonischemic cardiomyopathy/chronic systolic CHF and rheumatoid arthritis was referred by Dr. Clifton James for evaluation/management of CHF.    Patient has had a cardiomyopathy known for a number of years now.  Coronary angiography in 2012 showed no significant disease.  EF has been in the 25-30% range long-term.  Most recent echo in 8/18 showed EF remains 25-30%.  She has no family history of cardiomyopathy and she has never been a heavy drinker.  She has not used illicit drugs.  RHC in 10/18 showed preserved cardiac output with mildly increased filling pressures.  Lasix was increased to 120 qam/80 qpm.  She was seen by EP (Camnitz) and decided against ICD.    Echo in 10/19 showed EF 40-45% with diffuse hypokinesis, normal RV.   Echo in 2/21 showed EF 40-45%, diffuse hypokinesis.   She returns for followup of CHF.  Weight is down 8 lbs.  However, she has been more short of breath recently.  She is dyspneic with stairs and inclines and has orthopnea.  This has been going on since she decreased her Lasix dose to 40 mg bid on her own because she was urinating "too much."   No chest pain.  No lightheadedness.  Rare palpitations.   ECG (personally reviewed): NSR, LVH with early repolarization.   REDS clip 34%  Labs (9/18): K 4.4, creatinine 0.81 Labs (10/18): K 4, creatinine 0.88 Labs (1/19): K 3.9, creatinine 0.66 Labs (10/19): K 3.9, creatinine 0.67 Labs (12/19): creatinine 1.14 Labs (10/20): LDL 93, K 4.3, creatinine 8.93 Labs (4/21): K 2.7, creatinine 0.97 Labs (6/22): K 3.6, creatinine 0.67  PMH: 1. Rheumatoid arthritis: Followed by Dr. Lendon Colonel.  2. Type II diabetes.  3. HTN 4. Chronic systolic CHF: Nonischemic cardiomyopathy.  - LHC (2012): No significant CAD.  - Echo (7/13): EF 30-35% - Cardiac MRI (12/14): Mild LV dilation, EF 40%, normal RV size and systolic function, no late gadolinium  enhancement.  - Echo (7/16): EF 25-30% - Echo (8/18): EF 25-30% - RHC (10/18): mean RA 11, PA 47/20 mean 32, mean PCWP 16, CI 3.06 Fick/3.5 thermo, PVR 2.65 WU.  - Echo (10/19): EF 40-45%, diffuse hypokinesis, moderate diastolic dysfunction, normal RV size and systolic function  - Echo (2/21): EF 40-45%, diffuse hypokinesis.   SH: Lives with mother.  Rare ETOH, no drugs.  No smoking.   Family History  Problem Relation Age of Onset   Heart attack Mother    CAD Mother    Allergies Mother    Breast cancer Mother    Hypertension Other    Diabetes Mellitus II Other    ROS: All systems reviewed and negative except as per HPI.   Current Outpatient Medications  Medication Sig Dispense Refill   acetaminophen-codeine (TYLENOL #3) 300-30 MG tablet Take by mouth every 8 (eight) hours as needed for moderate pain.     albuterol (PROVENTIL) (2.5 MG/3ML) 0.083% nebulizer solution Take 2.5 mg by nebulization every 6 (six) hours as needed for wheezing or shortness of breath.     albuterol (VENTOLIN HFA) 108 (90 Base) MCG/ACT inhaler Inhale 2 puffs into the lungs every 6 (six) hours as needed for wheezing or shortness of breath.     betamethasone dipropionate (DIPROLENE) 0.05 % ointment Apply BID every other day to scalp. Avoid face, groin, underarms     BIDIL 20-37.5 MG tablet TAKE 2 TABLETS BY MOUTH THREE TIMES DAILY 540 tablet 3  clotrimazole (LOTRIMIN) 1 % cream Apply 1 application topically 2 (two) times daily.     dapagliflozin propanediol (FARXIGA) 10 MG TABS tablet Take 1 tablet (10 mg total) by mouth daily before breakfast. 30 tablet 11   ENTRESTO 97-103 MG Take 1 tablet by mouth twice daily 180 tablet 3   ferrous gluconate (FERGON) 324 MG tablet Take 324 mg by mouth daily.     Fluocinolone Acetonide Scalp 0.01 % OIL      folic acid (FOLVITE) 1 MG tablet Take 1 mg by mouth daily.     leflunomide (ARAVA) 20 MG tablet Take 20 mg by mouth daily.     levocetirizine (XYZAL) 5 MG tablet Take 5 mg  by mouth at bedtime.     methocarbamol (ROBAXIN) 500 MG tablet Take 500 mg by mouth 2 (two) times daily as needed (for muscle spasms.).      metoprolol succinate (TOPROL-XL) 100 MG 24 hr tablet TAKE 1 TABLET BY MOUTH TWICE DAILY TAKE  WITH  OR  IMMEDIATELY  FOLLOWING  A  MEAL 180 tablet 3   omeprazole (PRILOSEC) 40 MG capsule Take 1 capsule (40 mg total) by mouth daily before breakfast. 30 capsule 0   oxyCODONE-acetaminophen (PERCOCET/ROXICET) 5-325 MG tablet Take 1-2 tablets by mouth every 6 (six) hours as needed for severe pain. 6 tablet 0   potassium chloride SA (KLOR-CON) 20 MEQ tablet Take 1 tablet (20 mEq total) by mouth daily. 7 tablet 0   spironolactone (ALDACTONE) 25 MG tablet Take 1 tablet by mouth once daily 90 tablet 0   SYMBICORT 160-4.5 MCG/ACT inhaler Inhale 2 puffs into the lungs daily as needed (for respiratory issues.).      traMADol (ULTRAM) 50 MG tablet Take 50 mg by mouth every 4 (four) hours as needed for moderate pain or severe pain.     valACYclovir (VALTREX) 500 MG tablet Take 500 mg by mouth 2 (two) times daily as needed (for cold sores.).     furosemide (LASIX) 40 MG tablet Take 2 tablets (80 mg total) by mouth 2 (two) times daily. 360 tablet 3   No current facility-administered medications for this encounter.   BP 122/88   Pulse 86   Wt 95.3 kg (210 lb)   LMP 08/10/2019 (Exact Date)   SpO2 98%   BMI 38.41 kg/m  General: NAD, obese Neck: No JVD, no thyromegaly or thyroid nodule.  Lungs: Clear to auscultation bilaterally with normal respiratory effort. CV: Nondisplaced PMI.  Heart regular S1/S2, no S3/S4, no murmur.  No peripheral edema.  No carotid bruit.  Normal pedal pulses.  Abdomen: Soft, nontender, no hepatosplenomegaly, no distention.  Skin: Intact without lesions or rashes.  Neurologic: Alert and oriented x 3.  Psych: Normal affect. Extremities: No clubbing or cyanosis.  HEENT: Normal.   Assessment/Plan: 1. Chronic systolic CHF: Nonischemic  cardiomyopathy.  Etiology uncertain, possible prior viral myocarditis.  CMRI in 12/14 with no LGE noted.  No heavy drinking or drugs.  No history of familial cardiomyopathy.  Echo in 2/21 showed stable EF 40-45%.  NYHA class II-III symptoms, worse recently.  She does not appear volume overloaded on exam and REDS clip in the upper normal range at 34%, but symptoms significantly worsened since she decreased Lasix on her own.  - Increase Lasix back to 80 mg bid.  BMET 10 days.  - Continue current spironolactone.  - Continue Toprol XL 100 mg bid.   - Continue Bidil 2 tabs tid.   - Continue Entresto 97/103  bid.    - Continue dapagliflozin 10 mg daily - She will need echo at next appointment.  2. Rheumatoid arthritis: She sees a rheumatologist.   Echo + followup in 6 wks.   Marca Ancona 12/22/2020

## 2020-12-22 NOTE — Progress Notes (Signed)
ReDS Vest / Clip - 12/22/20 0900       ReDS Vest / Clip   Station Marker B    Ruler Value 32.5    ReDS Value Range Low volume    ReDS Actual Value 34

## 2020-12-22 NOTE — Patient Instructions (Signed)
EKG done today.  No Labs done today.  INCREASE Lasix to 80mg  (2 tablets) by mouth 2 times daily.   No other medication changes were made. Please continue all current medications as prescribed.  Your physician recommends that you schedule a follow-up appointment in: 10 days for a lab only appointment and in 6 weeks with an echo prior to your exam.  If you have any questions or concerns before your next appointment please send a message through [place] or call our office at [phone].    TO LEAVE A MESSAGE FOR THE NURSE SELECT OPTION 2, PLEASE LEAVE A MESSAGE INCLUDING: YOUR NAME DATE OF BIRTH CALL BACK NUMBER REASON FOR CALL**this is important as we prioritize the call backs  YOU WILL RECEIVE A CALL BACK THE SAME DAY AS LONG AS YOU CALL BEFORE 4:00 PM   Do the following things EVERYDAY: Weigh yourself in the morning before breakfast. Write it down and keep it in a log. Take your medicines as prescribed Eat low salt foods--Limit salt (sodium) to 2000 mg per day.  Stay as active as you can everyday Limit all fluids for the day to less than 2 liters   At the [facility], you and your health needs are our priority. As part of our continuing mission to provide you with exceptional heart care, we have created designated Provider Care Teams. These Care Teams include your primary Cardiologist (physician) and Advanced Practice Providers (APPs- Physician Assistants and Nurse Practitioners) who all work together to provide you with the care you need, when you need it.   You may see any of the following providers on your designated Care Team at your next follow up: Dr [phone] Dr Arvilla Meres, NP Carron Curie, Robbie Lis [place], PharmD   Please be sure to bring in all your medications bottles to every appointment.

## 2020-12-25 DIAGNOSIS — M5416 Radiculopathy, lumbar region: Secondary | ICD-10-CM | POA: Diagnosis not present

## 2021-01-01 ENCOUNTER — Other Ambulatory Visit: Payer: Self-pay

## 2021-01-01 ENCOUNTER — Ambulatory Visit (HOSPITAL_COMMUNITY)
Admission: RE | Admit: 2021-01-01 | Discharge: 2021-01-01 | Disposition: A | Discharge status: Home / Self Care | Payer: Medicare Other | Source: Ambulatory Visit | Attending: Cardiology | Admitting: Cardiology

## 2021-01-01 DIAGNOSIS — I5022 Chronic systolic (congestive) heart failure: Secondary | ICD-10-CM | POA: Insufficient documentation

## 2021-01-01 LAB — BASIC METABOLIC PANEL
Anion gap: 10 (ref 5–15)
BUN: 14 mg/dL (ref 6–20)
CO2: 27 mmol/L (ref 22–32)
Calcium: 8.9 mg/dL (ref 8.9–10.3)
Chloride: 102 mmol/L (ref 98–111)
Creatinine, Ser: 0.66 mg/dL (ref 0.44–1.00)
GFR, Estimated: >60 mL/min (ref >60)
Glucose, Bld: 162 mg/dL — ABNORMAL HIGH (ref 70–99)
Potassium: 3.5 mmol/L (ref 3.5–5.1)
Sodium: 139 mmol/L (ref 135–145)

## 2021-01-29 ENCOUNTER — Other Ambulatory Visit: Payer: Self-pay

## 2021-01-29 ENCOUNTER — Encounter (HOSPITAL_BASED_OUTPATIENT_CLINIC_OR_DEPARTMENT_OTHER): Payer: Self-pay | Admitting: *Deleted

## 2021-01-29 ENCOUNTER — Emergency Department (HOSPITAL_BASED_OUTPATIENT_CLINIC_OR_DEPARTMENT_OTHER)
Admission: EM | Admit: 2021-01-29 | Discharge: 2021-01-29 | Disposition: A | Discharge status: Home / Self Care | Payer: Medicare Other | Attending: Emergency Medicine | Admitting: Emergency Medicine

## 2021-01-29 DIAGNOSIS — Z955 Presence of coronary angioplasty implant and graft: Secondary | ICD-10-CM | POA: Diagnosis not present

## 2021-01-29 DIAGNOSIS — J45909 Unspecified asthma, uncomplicated: Secondary | ICD-10-CM | POA: Diagnosis not present

## 2021-01-29 DIAGNOSIS — E119 Type 2 diabetes mellitus without complications: Secondary | ICD-10-CM | POA: Diagnosis not present

## 2021-01-29 DIAGNOSIS — I110 Hypertensive heart disease with heart failure: Secondary | ICD-10-CM | POA: Diagnosis not present

## 2021-01-29 DIAGNOSIS — Z79899 Other long term (current) drug therapy: Secondary | ICD-10-CM | POA: Insufficient documentation

## 2021-01-29 DIAGNOSIS — Z7984 Long term (current) use of oral hypoglycemic drugs: Secondary | ICD-10-CM | POA: Diagnosis not present

## 2021-01-29 DIAGNOSIS — M79651 Pain in right thigh: Secondary | ICD-10-CM | POA: Insufficient documentation

## 2021-01-29 DIAGNOSIS — I5022 Chronic systolic (congestive) heart failure: Secondary | ICD-10-CM | POA: Insufficient documentation

## 2021-01-29 MED ORDER — NAPROXEN 500 MG PO TABS: 500.0000 mg | ORAL_TABLET | Freq: Two times a day (BID) | ORAL | 0 refills | Status: DC | PRN

## 2021-01-29 MED ORDER — NAPROXEN 250 MG PO TABS
500.0000 mg | ORAL_TABLET | Freq: Once | ORAL | Status: AC
  Administered 2021-01-29: 500 mg via ORAL
  Filled 2021-01-29: qty 2

## 2021-01-29 MED ORDER — CYCLOBENZAPRINE HCL 10 MG PO TABS: 10.0000 mg | ORAL_TABLET | Freq: Every evening | ORAL | 0 refills | Status: AC | PRN

## 2021-01-29 MED ORDER — CYCLOBENZAPRINE HCL 5 MG PO TABS
5.0000 mg | ORAL_TABLET | Freq: Once | ORAL | Status: AC
  Administered 2021-01-29: 5 mg via ORAL
  Filled 2021-01-29: qty 1

## 2021-01-29 NOTE — Discharge Instructions (Addendum)
I am prescribing you an anti-inflammatory medication called naproxen.  This is similar to Aleve.  You can take this up to 2 times a day for management of your pain.  Try to take it with a small amount of food to help prevent stomach irritation.  I am prescribing you a strong muscle relaxer called flexeril. Please only take this medication once in the evening with dinner. This medication can make you quite drowsy. Do not mix it with alcohol. Do not drive a vehicle after taking it.   If you develop any new or worsening symptoms please do not hesitate to return to the emergency department. It was a pleasure to meet you.

## 2021-01-29 NOTE — ED Provider Notes (Signed)
[facility] EMERGENCY DEPARTMENT Provider Note   CSN: [id number] Arrival date & time: 01/29/21  1442     History Chief Complaint  Patient presents with   Leg Pain    [name] is a 50 y.o. female.  HPI Patient is a 50 year old female with a medical history as noted below.  Patient states that for the past week she has been experiencing atraumatic pain to the right anterior lateral thigh that radiates into her right hip.  Symptoms worsen with movement of the right leg and when ambulating.  Denies any numbness, weakness, chest pain, shortness of breath, calf pain, calf swelling.  Patient denies any recent cancer treatments, immobilization, recent travel, history of blood clot, oral estrogen use, smoking, hemoptysis.  Denies anticoagulation.    Past Medical History:  Diagnosis Date   Allergy    Asthma    Cardiomyopathy    Non-ischemic   Diabetes mellitus without complication (HCC)    Herpes simplex of female genitalia    Hypertension    Rheumatoid arthritis(714.0)     Patient Active Problem List   Diagnosis Date Noted   Diverticulitis of large intestine with abscess without bleeding    Perforation of sigmoid colon due to diverticulitis 03/18/2018   Abscess of sigmoid colon due to diverticulitis s/p perc drainage 03/15/2018 03/14/2018   Chronic systolic heart failure (HCC) 05/20/2017   Alopecia areata 08/26/2016   Palpitations 10/31/2014   Non-ischemic cardiomyopathy (HCC) 10/31/2014   Pulmonary infiltrates 08/01/2013   Cough 08/01/2013   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   Infertility, 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   Rheumatoid arthritis (HCC) 05/24/2012   Hypertension 03/03/2012   GERD (gastroesophageal reflux disease) 03/03/2012   Morbid obesity,BMI of 45.0-49.9, adult 03/03/2012   Rheumatoid arthritis(714.0) 03/03/2012   Diabetes mellitus, type 2 (HCC) 03/03/2012   Hypokalemia 03/03/2012   Lupus (HCC) 03/02/2012   ASTHMA 06/04/2010     Past Surgical History:  Procedure Laterality Date   DENTAL SURGERY     OVARY SURGERY     RIGHT HEART CATH N/A 01/18/2017   Procedure: RIGHT HEART CATH;  Surgeon: Laurey Morale, MD;  Location: [facility] INVASIVE CV LAB;  Service: Cardiovascular;  Laterality: N/A;     OB History   No obstetric history on file.     Family History  Problem Relation Age of Onset   Heart attack Mother    CAD Mother    Allergies Mother    Breast cancer Mother    Hypertension Other    Diabetes Mellitus II Other     Social History   Tobacco Use   Smoking status: Never   Smokeless tobacco: Never  Vaping Use   Vaping Use: Never used  Substance Use Topics   Alcohol use: No   Drug use: No    Home Medications Prior to Admission medications   Medication Sig Start Date End Date Taking? Authorizing Provider  cyclobenzaprine (FLEXERIL) 10 MG tablet Take 1 tablet (10 mg total) by mouth at bedtime as needed for muscle spasms. 01/29/21  Yes Placido Sou, PA-C  naproxen (NAPROSYN) 500 MG tablet Take 1 tablet (500 mg total) by mouth 2 (two) times daily as needed. 01/29/21  Yes Placido Sou, PA-C  acetaminophen-codeine (TYLENOL #3) 300-30 MG tablet Take by mouth every 8 (eight) hours as needed for moderate pain.    [provider]  albuterol (PROVENTIL) (2.5 MG/3ML) 0.083% nebulizer solution Take 2.5 mg by nebulization every 6 (six) hours as needed for  wheezing or shortness of breath.    [provider]  albuterol (VENTOLIN HFA) 108 (90 Base) MCG/ACT inhaler Inhale 2 puffs into the lungs every 6 (six) hours as needed for wheezing or shortness of breath.    [provider]  betamethasone dipropionate (DIPROLENE) 0.05 % ointment Apply BID every other day to scalp. Avoid face, groin, underarms 05/03/19   [provider]  BIDIL 20-37.5 MG tablet TAKE 2 TABLETS BY MOUTH THREE TIMES DAILY 08/08/19   Laurey Morale, MD  clotrimazole (LOTRIMIN) 1 % cream Apply 1 application  topically 2 (two) times daily. 03/13/18   [provider]  dapagliflozin propanediol (FARXIGA) 10 MG TABS tablet Take 1 tablet (10 mg total) by mouth daily before breakfast. 12/13/19   Allayne Butcher, PA-C  ENTRESTO 97-103 MG Take 1 tablet by mouth twice daily 08/08/19   Laurey Morale, MD  ferrous gluconate (FERGON) 324 MG tablet Take 324 mg by mouth daily.    [provider]  Fluocinolone Acetonide Scalp 0.01 % OIL  04/26/19   [provider]  folic acid (FOLVITE) 1 MG tablet Take 1 mg by mouth daily.    [provider]  furosemide (LASIX) 40 MG tablet Take 2 tablets (80 mg total) by mouth 2 (two) times daily. 12/22/20   Laurey Morale, MD  leflunomide (ARAVA) 20 MG tablet Take 20 mg by mouth daily. 03/23/19   [provider]  levocetirizine (XYZAL) 5 MG tablet Take 5 mg by mouth at bedtime.    [provider]  metoprolol succinate (TOPROL-XL) 100 MG 24 hr tablet TAKE 1 TABLET BY MOUTH TWICE DAILY TAKE  WITH  OR  IMMEDIATELY  FOLLOWING  A  MEAL 11/15/19   Laurey Morale, MD  omeprazole (PRILOSEC) 40 MG capsule Take 1 capsule (40 mg total) by mouth daily before breakfast. 03/28/18   Rodolph Bong, MD  oxyCODONE-acetaminophen (PERCOCET/ROXICET) 5-325 MG tablet Take 1-2 tablets by mouth every 6 (six) hours as needed for severe pain. 01/02/20   Rolan Bucco, MD  potassium chloride SA (KLOR-CON) 20 MEQ tablet Take 1 tablet (20 mEq total) by mouth daily. 07/31/19   Little, Ambrose Finland, MD  spironolactone (ALDACTONE) 25 MG tablet Take 1 tablet by mouth once daily 09/10/20   Laurey Morale, MD  [facility] 160-4.5 MCG/ACT inhaler Inhale 2 puffs into the lungs daily as needed (for respiratory issues.).  06/01/13   [provider]  traMADol (ULTRAM) 50 MG tablet Take 50 mg by mouth every 4 (four) hours as needed for moderate pain or severe pain.    [provider]  valACYclovir (VALTREX) 500 MG tablet Take 500 mg by mouth 2 (two)  times daily as needed (for cold sores.).    [provider]    Allergies    Clarithromycin, Hydrocodone, Ondansetron, Penicillins, and Tdap [tetanus-diphth-acell pertussis]  Review of Systems   Review of Systems  Respiratory:  Negative for cough and shortness of breath.   Cardiovascular:  Negative for chest pain.  Musculoskeletal:  Positive for myalgias.  Skin:  Negative for color change and wound.  Neurological:  Negative for weakness and numbness.   Physical Exam Updated Vital Signs BP (!) 128/93 (BP Location: Left Arm)   Pulse (!) 103   Temp 98.9 F (37.2 C) (Oral)   Resp 18   Ht 5\' 2"  (1.575 m)   Wt 95.3 kg   LMP 08/10/2019 (Exact Date)   SpO2 100%   BMI 38.41 kg/m  Physical Exam Vitals and nursing note reviewed.  Constitutional:      General: She is not in acute distress.    Appearance: She is well-developed.  HENT:     Head: Normocephalic and atraumatic.     Right Ear: External ear normal.     Left Ear: External ear normal.  Eyes:     General: No scleral icterus.       Right eye: No discharge.        Left eye: No discharge.     Conjunctiva/sclera: Conjunctivae normal.  Neck:     Trachea: No tracheal deviation.  Cardiovascular:     Rate and Rhythm: Normal rate and regular rhythm.     Pulses: Normal pulses.     Heart sounds: Normal heart sounds. No murmur heard.   No friction rub. No gallop.  Pulmonary:     Effort: Pulmonary effort is normal. No respiratory distress.     Breath sounds: Normal breath sounds. No stridor. No wheezing, rhonchi or rales.  Chest:     Chest wall: No tenderness.  Abdominal:     General: There is no distension.  Musculoskeletal:        General: Tenderness present. No swelling or deformity.     Cervical back: Neck supple.     Comments: Mild tenderness appreciated along the right lateral thigh.  No palpable cords.  No tenderness appreciated in the bilateral calves.  No midline thoracic or lumbar spine tenderness.  No  edema appreciated in the bilateral lower extremities.  2+ DP pulses.    Skin:    General: Skin is warm and dry.     Findings: No rash.  Neurological:     General: No focal deficit present.     Mental Status: She is alert and oriented to person, place, and time.     Cranial Nerves: Cranial nerve deficit: no gross deficits.     Comments: Distal sensation intact in the bilateral lower extremities.  Strength is 5/5.  2+ patellar DTRs noted bilaterally.  Psychiatric:        Mood and Affect: Mood normal.        Behavior: Behavior normal.   ED Results / Procedures / Treatments   Labs (all labs ordered are listed, but only abnormal results are displayed) Labs Reviewed - No data to display  EKG None  Radiology No results found.  Procedures Procedures   Medications Ordered in ED Medications  naproxen (NAPROSYN) tablet 500 mg (500 mg Oral Given 01/29/21 1520)  cyclobenzaprine (FLEXERIL) tablet 5 mg (5 mg Oral Given 01/29/21 1520)   ED Course  I have reviewed the triage vital signs and the nursing notes.  Pertinent labs & imaging results that were available during my care of the patient were reviewed by me and considered in my medical decision making (see chart for details).    MDM Rules/Calculators/A&P                          Patient is a 50 year old female who presents to the emergency department due to right lateral thigh pain that began about 1 week ago.  Patient denies any falls or trauma.  Physical exam significant for mild tenderness along the right lateral thigh musculature.  Compartments are soft.  Neurovascularly intact in the bilateral lower extremities.  Low risk Wells criteria for DVT/PE.  Will treat with a course of naproxen as well as cyclobenzaprine.  Patient states she has taken cyclobenzaprine  in the past.  Kidney function was reassuring on BMP 1 month ago.  We discussed safety regarding these medications.  We discussed return precautions.  Feel that she is  stable for discharge at this time and she is agreeable.  She was given a dose of both naproxen as well as cyclobenzaprine in the emergency department.  She states she has a ride home.  Her questions were answered and she was amicable at the time of discharge.  Final Clinical Impression(s) / ED Diagnoses Final diagnoses:  Right thigh pain   Rx / DC Orders ED Discharge Orders          Ordered    naproxen (NAPROSYN) 500 MG tablet  2 times daily PRN        01/29/21 1523    cyclobenzaprine (FLEXERIL) 10 MG tablet  At bedtime PRN        01/29/21 1523             Placido Sou, PA-C 01/29/21 1528    Benjiman Core, MD 01/30/21 [phone]

## 2021-01-29 NOTE — ED Triage Notes (Signed)
C/o right thigh pain x 1 week , denies injury , increased pain with movt and walking

## 2021-02-04 ENCOUNTER — Emergency Department (HOSPITAL_BASED_OUTPATIENT_CLINIC_OR_DEPARTMENT_OTHER)
Admission: EM | Admit: 2021-02-04 | Discharge: 2021-02-04 | Disposition: A | Discharge status: Home / Self Care | Payer: Medicare Other | Attending: Emergency Medicine | Admitting: Emergency Medicine

## 2021-02-04 ENCOUNTER — Other Ambulatory Visit: Payer: Self-pay

## 2021-02-04 ENCOUNTER — Encounter (HOSPITAL_BASED_OUTPATIENT_CLINIC_OR_DEPARTMENT_OTHER): Payer: Self-pay | Admitting: *Deleted

## 2021-02-04 DIAGNOSIS — J069 Acute upper respiratory infection, unspecified: Secondary | ICD-10-CM | POA: Diagnosis not present

## 2021-02-04 DIAGNOSIS — J45909 Unspecified asthma, uncomplicated: Secondary | ICD-10-CM | POA: Diagnosis not present

## 2021-02-04 DIAGNOSIS — Z5321 Procedure and treatment not carried out due to patient leaving prior to being seen by health care provider: Secondary | ICD-10-CM | POA: Diagnosis not present

## 2021-02-04 DIAGNOSIS — J4520 Mild intermittent asthma, uncomplicated: Secondary | ICD-10-CM | POA: Diagnosis not present

## 2021-02-04 DIAGNOSIS — R0602 Shortness of breath: Secondary | ICD-10-CM | POA: Insufficient documentation

## 2021-02-04 NOTE — ED Triage Notes (Signed)
C/o SOB x 1 day HX asthma , no relief with neb tx

## 2021-02-04 NOTE — ED Notes (Signed)
RT to triage to for eval

## 2021-02-11 ENCOUNTER — Encounter (HOSPITAL_COMMUNITY): Payer: Medicare Other | Admitting: Cardiology

## 2021-02-11 ENCOUNTER — Ambulatory Visit (HOSPITAL_COMMUNITY): Admission: RE | Admit: 2021-02-11 | Payer: Medicare Other | Source: Ambulatory Visit

## 2021-02-14 DIAGNOSIS — R0981 Nasal congestion: Secondary | ICD-10-CM | POA: Diagnosis not present

## 2021-02-14 DIAGNOSIS — R051 Acute cough: Secondary | ICD-10-CM | POA: Diagnosis not present

## 2021-02-14 DIAGNOSIS — J4541 Moderate persistent asthma with (acute) exacerbation: Secondary | ICD-10-CM | POA: Diagnosis not present

## 2021-02-16 DIAGNOSIS — E11620 Type 2 diabetes mellitus with diabetic dermatitis: Secondary | ICD-10-CM | POA: Diagnosis not present

## 2021-02-16 DIAGNOSIS — I5022 Chronic systolic (congestive) heart failure: Secondary | ICD-10-CM | POA: Diagnosis not present

## 2021-02-16 DIAGNOSIS — J4520 Mild intermittent asthma, uncomplicated: Secondary | ICD-10-CM | POA: Diagnosis not present

## 2021-02-16 DIAGNOSIS — R0602 Shortness of breath: Secondary | ICD-10-CM | POA: Diagnosis not present

## 2021-02-16 DIAGNOSIS — E039 Hypothyroidism, unspecified: Secondary | ICD-10-CM | POA: Diagnosis not present

## 2021-02-17 ENCOUNTER — Ambulatory Visit
Admission: RE | Admit: 2021-02-17 | Discharge: 2021-02-17 | Disposition: A | Discharge status: Home / Self Care | Payer: Medicare Other | Source: Ambulatory Visit | Attending: Family Medicine | Admitting: Family Medicine

## 2021-02-17 ENCOUNTER — Other Ambulatory Visit: Payer: Self-pay | Admitting: Family Medicine

## 2021-02-17 DIAGNOSIS — R0602 Shortness of breath: Secondary | ICD-10-CM

## 2021-02-17 DIAGNOSIS — R059 Cough, unspecified: Secondary | ICD-10-CM | POA: Diagnosis not present

## 2021-02-19 DIAGNOSIS — I5022 Chronic systolic (congestive) heart failure: Secondary | ICD-10-CM | POA: Diagnosis not present

## 2021-02-19 DIAGNOSIS — R051 Acute cough: Secondary | ICD-10-CM | POA: Diagnosis not present

## 2021-02-20 DIAGNOSIS — Z1231 Encounter for screening mammogram for malignant neoplasm of breast: Secondary | ICD-10-CM | POA: Diagnosis not present

## 2021-02-23 ENCOUNTER — Telehealth (HOSPITAL_COMMUNITY): Payer: Self-pay

## 2021-02-23 DIAGNOSIS — Z23 Encounter for immunization: Secondary | ICD-10-CM | POA: Diagnosis not present

## 2021-02-23 DIAGNOSIS — I5022 Chronic systolic (congestive) heart failure: Secondary | ICD-10-CM | POA: Diagnosis not present

## 2021-02-23 NOTE — Telephone Encounter (Signed)
Spoke w/PCP office, they states pt has been having increased SOB and had an abn chest xray on Thur, they would like her seen asap. Advised pt cancelled her echo and appt with Dr Shirlee Latch on 11/2. Scheduled appt for [facility] 11/17 in APP clinic, pcp is aware and will advise pt, they express the urgency and importance of keeping/coming to this appt.

## 2021-02-23 NOTE — Telephone Encounter (Signed)
Per provider at [facility] patient seems to be in heart failure and needs to be evaluated.  Please contact patient to schedule appointment.

## 2021-02-25 ENCOUNTER — Telehealth (HOSPITAL_COMMUNITY): Payer: Self-pay

## 2021-02-25 NOTE — Telephone Encounter (Signed)
Called and left patient a detailed message to confirm/remind patient of their appointment at the [facility] on 02/26/21.   I also left patient a message to bring all medications and/or complete list.  .

## 2021-02-26 ENCOUNTER — Encounter (HOSPITAL_COMMUNITY): Payer: Self-pay

## 2021-02-26 ENCOUNTER — Ambulatory Visit (HOSPITAL_COMMUNITY)
Admission: RE | Admit: 2021-02-26 | Discharge: 2021-02-26 | Disposition: A | Discharge status: Home / Self Care | Payer: Medicare Other | Source: Ambulatory Visit | Attending: Adult Health | Admitting: Adult Health

## 2021-02-26 ENCOUNTER — Other Ambulatory Visit: Payer: Self-pay

## 2021-02-26 VITALS — BP 130/80 | HR 94 | Wt 219.8 lb

## 2021-02-26 DIAGNOSIS — M2550 Pain in unspecified joint: Secondary | ICD-10-CM | POA: Diagnosis not present

## 2021-02-26 DIAGNOSIS — Z79899 Other long term (current) drug therapy: Secondary | ICD-10-CM | POA: Diagnosis not present

## 2021-02-26 DIAGNOSIS — I110 Hypertensive heart disease with heart failure: Secondary | ICD-10-CM | POA: Diagnosis not present

## 2021-02-26 DIAGNOSIS — I5022 Chronic systolic (congestive) heart failure: Secondary | ICD-10-CM | POA: Diagnosis not present

## 2021-02-26 DIAGNOSIS — I428 Other cardiomyopathies: Secondary | ICD-10-CM | POA: Diagnosis not present

## 2021-02-26 DIAGNOSIS — M069 Rheumatoid arthritis, unspecified: Secondary | ICD-10-CM | POA: Insufficient documentation

## 2021-02-26 DIAGNOSIS — Z7984 Long term (current) use of oral hypoglycemic drugs: Secondary | ICD-10-CM | POA: Diagnosis not present

## 2021-02-26 DIAGNOSIS — Z7901 Long term (current) use of anticoagulants: Secondary | ICD-10-CM | POA: Diagnosis not present

## 2021-02-26 DIAGNOSIS — I158 Other secondary hypertension: Secondary | ICD-10-CM | POA: Diagnosis not present

## 2021-02-26 MED ORDER — BENZONATATE 100 MG PO CAPS: 100.0000 mg | ORAL_CAPSULE | ORAL | 1 refills | Status: AC | PRN

## 2021-02-26 NOTE — Progress Notes (Signed)
PCP: No longer has PCP Cardiology: Dr. Clifton James HF Cardiology: Dr. Shirlee Latch Rheumatoligist: Dr Freda Munro  50 y.o. with history of nonischemic cardiomyopathy/chronic systolic CHF and rheumatoid arthritis.   Patient has had a cardiomyopathy known for a number of years now.  Coronary angiography in 2012 showed no significant disease.  EF has been in the 25-30% range long-term.  Most recent echo in 8/18 showed EF remains 25-30%.  She has no family history of cardiomyopathy and she has never been a heavy drinker.  She has not used illicit drugs.  RHC in 10/18 showed preserved cardiac output with mildly increased filling pressures.  Lasix was increased to 120 qam/80 qpm.  She was seen by EP (Camnitz) and decided against ICD.    Echo in 10/19 showed EF 40-45% with diffuse hypokinesis, normal RV.   Echo in 2/21 showed EF 40-45%, diffuse hypokinesis.   Saw Dr Shirlee Latch 12/22/20. Volume status elevated so lasix was increased to 80 mg twice a day.   Today she returns for HF follow up.Overall feeling fine. Having ongoing joint pain. Takes ultram chronically for joint pain. Denies SOB/PND/Orthopnea. Appetite ok. No fever or chills. Weight at home has been stable.Taking all medications.  Reds Clip 34%   Labs (9/18): K 4.4, creatinine 0.81 Labs (10/18): K 4, creatinine 0.88 Labs (1/19): K 3.9, creatinine 0.66 Labs (10/19): K 3.9, creatinine 0.67 Labs (12/19): creatinine 1.14 Labs (10/20): LDL 93, K 4.3, creatinine 8.67 Labs (4/21): K 2.7, creatinine 0.97 Labs (6/22): K 3.6, creatinine 0.67 Labs (01/01/21): K 3.5 Creatinine 0.66   PMH: 1. Rheumatoid arthritis: Followed by Dr. Lendon Colonel.  2. Type II diabetes.  3. HTN 4. Chronic systolic CHF: Nonischemic cardiomyopathy.  - LHC (2012): No significant CAD.  - Echo (7/13): EF 30-35% - Cardiac MRI (12/14): Mild LV dilation, EF 40%, normal RV size and systolic function, no late gadolinium enhancement.  - Echo (7/16): EF 25-30% - Echo (8/18): EF 25-30% - RHC  (10/18): mean RA 11, PA 47/20 mean 32, mean PCWP 16, CI 3.06 Fick/3.5 thermo, PVR 2.65 WU.  - Echo (10/19): EF 40-45%, diffuse hypokinesis, moderate diastolic dysfunction, normal RV size and systolic function  - Echo (2/21): EF 40-45%, diffuse hypokinesis.   SH: Lives with mother.  Rare ETOH, no drugs.  No smoking.   Family History  Problem Relation Age of Onset   Heart attack Mother    CAD Mother    Allergies Mother    Breast cancer Mother    Hypertension Other    Diabetes Mellitus II Other    ROS: All systems reviewed and negative except as per HPI.   Current Outpatient Medications  Medication Sig Dispense Refill   acetaminophen-codeine (TYLENOL #3) 300-30 MG tablet Take by mouth every 8 (eight) hours as needed for moderate pain.     albuterol (PROVENTIL) (2.5 MG/3ML) 0.083% nebulizer solution Take 2.5 mg by nebulization every 6 (six) hours as needed for wheezing or shortness of breath.     albuterol (VENTOLIN HFA) 108 (90 Base) MCG/ACT inhaler Inhale 2 puffs into the lungs every 6 (six) hours as needed for wheezing or shortness of breath.     benzonatate (TESSALON) 100 MG capsule Take 100 mg by mouth as needed for cough.     betamethasone dipropionate (DIPROLENE) 0.05 % ointment Apply BID every other day to scalp. Avoid face, groin, underarms     BIDIL 20-37.5 MG tablet TAKE 2 TABLETS BY MOUTH THREE TIMES DAILY 540 tablet 3   cyclobenzaprine (FLEXERIL) 10 MG  tablet Take 1 tablet (10 mg total) by mouth at bedtime as needed for muscle spasms. 8 tablet 0   dapagliflozin propanediol (FARXIGA) 10 MG TABS tablet Take 1 tablet (10 mg total) by mouth daily before breakfast. 30 tablet 11   ENTRESTO 97-103 MG Take 1 tablet by mouth twice daily 180 tablet 3   ferrous gluconate (FERGON) 324 MG tablet Take 324 mg by mouth daily.     Fluocinolone Acetonide Scalp 0.01 % OIL      folic acid (FOLVITE) 1 MG tablet Take 1 mg by mouth daily.     furosemide (LASIX) 40 MG tablet Take 2 tablets (80 mg  total) by mouth 2 (two) times daily. 360 tablet 3   leflunomide (ARAVA) 20 MG tablet Take 20 mg by mouth daily.     levocetirizine (XYZAL) 5 MG tablet Take 5 mg by mouth at bedtime.     metoprolol succinate (TOPROL-XL) 100 MG 24 hr tablet TAKE 1 TABLET BY MOUTH TWICE DAILY TAKE  WITH  OR  IMMEDIATELY  FOLLOWING  A  MEAL 180 tablet 3   omeprazole (PRILOSEC) 40 MG capsule Take 1 capsule (40 mg total) by mouth daily before breakfast. 30 capsule 0   potassium chloride SA (KLOR-CON) 20 MEQ tablet Take 1 tablet (20 mEq total) by mouth daily. 7 tablet 0   spironolactone (ALDACTONE) 25 MG tablet Take 1 tablet by mouth once daily 90 tablet 0   SYMBICORT 160-4.5 MCG/ACT inhaler Inhale 2 puffs into the lungs daily as needed (for respiratory issues.).      traMADol (ULTRAM) 50 MG tablet Take 50 mg by mouth every 4 (four) hours as needed for moderate pain or severe pain.     valACYclovir (VALTREX) 500 MG tablet Take 500 mg by mouth 2 (two) times daily as needed (for cold sores.).     No current facility-administered medications for this encounter.   BP 130/80   Pulse 94   Wt 99.7 kg (219 lb 12.8 oz)   LMP 08/10/2019 (Exact Date)   SpO2 100%   BMI 40.20 kg/m  Vitals:   02/26/21 1014  BP: 130/80  Pulse: 94  SpO2: 100%   Wt Readings from Last 3 Encounters:  02/26/21 99.7 kg (219 lb 12.8 oz)  02/04/21 95.3 kg (210 lb)  01/29/21 95.3 kg (210 lb)   Reds Clip 34%.   General:  Walked in the clinic.  No resp difficulty HEENT: normal Neck: supple. no JVD. Carotids 2+ bilat; no bruits. No lymphadenopathy or thryomegaly appreciated. Cor: PMI nondisplaced. Regular rate & rhythm. No rubs, gallops or murmurs. Lungs: clear Abdomen: soft, nontender, nondistended. No hepatosplenomegaly. No bruits or masses. Good bowel sounds. Extremities: no cyanosis, clubbing, rash, edema Neuro: alert & orientedx3, cranial nerves grossly intact. moves all 4 extremities w/o difficulty. Affect  pleasant   Assessment/Plan: 1. Chronic systolic CHF: Nonischemic cardiomyopathy.  Etiology uncertain, possible prior viral myocarditis.  CMRI in 12/14 with no LGE noted.  No heavy drinking or drugs.  No history of familial cardiomyopathy.  Echo in 2/21 showed stable EF 40-45%.   - NYHA II. Reds Clip 34%. Volume status stable.  - Continue  Lasix  80 mg bid.   - Continue Spironolactone 25 mg daily  - Continue Toprol XL 100 mg bid.   - Continue Bidil 2 tabs tid.   - Continue Entresto 97/103 bid.    - Continue dapagliflozin 10 mg daily - Echo set up for 04/20/2021.   2. Rheumatoid arthritis: She sees  a rheumatologist, Dr Freda Munro  3. HTN  -Continue current regimen.  - Stable.   Has ECHO set up for 04/20/2021 and Dr Greig Castilla NP-C  02/26/2021

## 2021-02-26 NOTE — Progress Notes (Signed)
ReDS Vest / Clip - 02/26/21 1000       ReDS Vest / Clip   Station Marker B    Ruler Value 32.5    ReDS Value Range Low volume    ReDS Actual Value 34

## 2021-02-26 NOTE — Patient Instructions (Signed)
It was great to see you today! No medication changes are needed at this time.  Keep follow up as scheduled  Do the following things EVERYDAY: Weigh yourself in the morning before breakfast. Write it down and keep it in a log. Take your medicines as prescribed Eat low salt foods--Limit salt (sodium) to 2000 mg per day.  Stay as active as you can everyday Limit all fluids for the day to less than 2 liters  At the [facility], you and your health needs are our priority. As part of our continuing mission to provide you with exceptional heart care, we have created designated Provider Care Teams. These Care Teams include your primary Cardiologist (physician) and Advanced Practice Providers (APPs- Physician Assistants and Nurse Practitioners) who all work together to provide you with the care you need, when you need it.   You may see any of the following providers on your designated Care Team at your next follow up: Dr Arvilla Meres Dr Carron Curie, NP Robbie Lis, [place] [facility] [place], [place] Karle Plumber, PharmD   Please be sure to bring in all your medications bottles to every appointment.

## 2021-03-02 DIAGNOSIS — M069 Rheumatoid arthritis, unspecified: Secondary | ICD-10-CM | POA: Diagnosis not present

## 2021-03-02 DIAGNOSIS — E1169 Type 2 diabetes mellitus with other specified complication: Secondary | ICD-10-CM | POA: Diagnosis not present

## 2021-03-16 DIAGNOSIS — L668 Other cicatricial alopecia: Secondary | ICD-10-CM | POA: Diagnosis not present

## 2021-03-16 DIAGNOSIS — L538 Other specified erythematous conditions: Secondary | ICD-10-CM | POA: Diagnosis not present

## 2021-03-21 ENCOUNTER — Other Ambulatory Visit (HOSPITAL_COMMUNITY): Payer: Self-pay | Admitting: Adult Health

## 2021-04-20 ENCOUNTER — Encounter (HOSPITAL_COMMUNITY): Payer: Medicare Other | Admitting: Cardiology

## 2021-04-20 ENCOUNTER — Ambulatory Visit (HOSPITAL_COMMUNITY): Admission: RE | Admit: 2021-04-20 | Payer: Commercial Managed Care - HMO | Source: Ambulatory Visit

## 2021-04-20 DIAGNOSIS — J45909 Unspecified asthma, uncomplicated: Secondary | ICD-10-CM | POA: Diagnosis not present

## 2021-04-20 DIAGNOSIS — M25511 Pain in right shoulder: Secondary | ICD-10-CM | POA: Diagnosis not present

## 2021-04-20 DIAGNOSIS — E1169 Type 2 diabetes mellitus with other specified complication: Secondary | ICD-10-CM | POA: Diagnosis not present

## 2021-04-20 DIAGNOSIS — G894 Chronic pain syndrome: Secondary | ICD-10-CM | POA: Diagnosis not present

## 2021-04-20 DIAGNOSIS — K219 Gastro-esophageal reflux disease without esophagitis: Secondary | ICD-10-CM | POA: Diagnosis not present

## 2021-04-23 DIAGNOSIS — Z79899 Other long term (current) drug therapy: Secondary | ICD-10-CM | POA: Diagnosis not present

## 2021-04-23 DIAGNOSIS — M2550 Pain in unspecified joint: Secondary | ICD-10-CM | POA: Diagnosis not present

## 2021-04-23 DIAGNOSIS — M0600 Rheumatoid arthritis without rheumatoid factor, unspecified site: Secondary | ICD-10-CM | POA: Diagnosis not present

## 2021-04-30 DIAGNOSIS — L538 Other specified erythematous conditions: Secondary | ICD-10-CM | POA: Diagnosis not present

## 2021-04-30 DIAGNOSIS — L648 Other androgenic alopecia: Secondary | ICD-10-CM | POA: Diagnosis not present

## 2021-04-30 DIAGNOSIS — L668 Other cicatricial alopecia: Secondary | ICD-10-CM | POA: Diagnosis not present

## 2021-06-02 DIAGNOSIS — M25511 Pain in right shoulder: Secondary | ICD-10-CM | POA: Diagnosis not present

## 2021-06-08 ENCOUNTER — Other Ambulatory Visit: Payer: Self-pay | Admitting: Sports Medicine

## 2021-06-08 ENCOUNTER — Ambulatory Visit
Admission: RE | Admit: 2021-06-08 | Discharge: 2021-06-08 | Disposition: A | Discharge status: Home / Self Care | Payer: Medicare Other | Source: Ambulatory Visit | Attending: Sports Medicine | Admitting: Sports Medicine

## 2021-06-08 DIAGNOSIS — E876 Hypokalemia: Secondary | ICD-10-CM | POA: Diagnosis not present

## 2021-06-08 DIAGNOSIS — Z0000 Encounter for general adult medical examination without abnormal findings: Secondary | ICD-10-CM | POA: Diagnosis not present

## 2021-06-08 DIAGNOSIS — E039 Hypothyroidism, unspecified: Secondary | ICD-10-CM | POA: Diagnosis not present

## 2021-06-08 DIAGNOSIS — D649 Anemia, unspecified: Secondary | ICD-10-CM | POA: Diagnosis not present

## 2021-06-08 DIAGNOSIS — R52 Pain, unspecified: Secondary | ICD-10-CM

## 2021-06-08 DIAGNOSIS — E785 Hyperlipidemia, unspecified: Secondary | ICD-10-CM | POA: Diagnosis not present

## 2021-06-08 DIAGNOSIS — E1169 Type 2 diabetes mellitus with other specified complication: Secondary | ICD-10-CM | POA: Diagnosis not present

## 2021-06-08 DIAGNOSIS — Z5181 Encounter for therapeutic drug level monitoring: Secondary | ICD-10-CM | POA: Diagnosis not present

## 2021-06-08 DIAGNOSIS — K219 Gastro-esophageal reflux disease without esophagitis: Secondary | ICD-10-CM | POA: Diagnosis not present

## 2021-06-08 DIAGNOSIS — I5022 Chronic systolic (congestive) heart failure: Secondary | ICD-10-CM | POA: Diagnosis not present

## 2021-06-08 DIAGNOSIS — M25511 Pain in right shoulder: Secondary | ICD-10-CM | POA: Diagnosis not present

## 2021-06-11 ENCOUNTER — Ambulatory Visit (HOSPITAL_BASED_OUTPATIENT_CLINIC_OR_DEPARTMENT_OTHER)
Admission: RE | Admit: 2021-06-11 | Discharge: 2021-06-11 | Disposition: A | Discharge status: Home / Self Care | Payer: Medicare Other | Source: Ambulatory Visit | Attending: Cardiology | Admitting: Cardiology

## 2021-06-11 ENCOUNTER — Ambulatory Visit (HOSPITAL_COMMUNITY)
Admission: RE | Admit: 2021-06-11 | Discharge: 2021-06-11 | Disposition: A | Discharge status: Home / Self Care | Payer: Medicare Other | Source: Ambulatory Visit | Attending: Cardiology | Admitting: Cardiology

## 2021-06-11 ENCOUNTER — Other Ambulatory Visit: Payer: Self-pay

## 2021-06-11 VITALS — BP 122/70 | HR 82 | Wt 212.0 lb

## 2021-06-11 DIAGNOSIS — I428 Other cardiomyopathies: Secondary | ICD-10-CM | POA: Diagnosis not present

## 2021-06-11 DIAGNOSIS — Z79899 Other long term (current) drug therapy: Secondary | ICD-10-CM | POA: Diagnosis not present

## 2021-06-11 DIAGNOSIS — M069 Rheumatoid arthritis, unspecified: Secondary | ICD-10-CM | POA: Diagnosis not present

## 2021-06-11 DIAGNOSIS — I5022 Chronic systolic (congestive) heart failure: Secondary | ICD-10-CM | POA: Diagnosis not present

## 2021-06-11 LAB — ECHOCARDIOGRAM COMPLETE
AR max vel: 2.21 cm2
AV Peak grad: 5.8 mmHg
Ao pk vel: 1.2 m/s
Area-P 1/2: 5.79 cm2
Calc EF: 36.9 %
S' Lateral: 5.6 cm
Single Plane A2C EF: 38.7 %
Single Plane A4C EF: 36.4 %

## 2021-06-11 MED ORDER — FUROSEMIDE 40 MG PO TABS: 80.0000 mg | ORAL_TABLET | Freq: Every day | ORAL | 3 refills | Status: DC

## 2021-06-11 NOTE — Progress Notes (Signed)
PCP: Dr. Hyman Hopes ?Cardiology: Dr. Clifton James ?HF Cardiology: Dr. Shirlee Latch ? ?51 y.o. with history of nonischemic cardiomyopathy/chronic systolic CHF and rheumatoid arthritis was referred by Dr. Clifton James for evaluation/management of CHF.   ? ?Patient has had a cardiomyopathy known for a number of years now.  Coronary angiography in 2012 showed no significant disease.  EF has been in the 25-30% range long-term.  Most recent echo in 8/18 showed EF remains 25-30%.  She has no family history of cardiomyopathy and she has never been a heavy drinker.  She has not used illicit drugs.  RHC in 10/18 showed preserved cardiac output with mildly increased filling pressures.  Lasix was increased to 120 qam/80 qpm.  She was seen by EP (Camnitz) and decided against ICD.   ? ?Echo in 10/19 showed EF 40-45% with diffuse hypokinesis, normal RV.  ? ?Echo in 2/21 showed EF 40-45%, diffuse hypokinesis.  ? ?Echo was done today and reviewed, EF lower at 30-35%, mild-moderate LV dilation, normal RV.  ? ?She returns for followup of CHF.  Weight is down 7 lbs.  She admits to taking her medications only about "50%" of the time.  She is taking Lasix 80 mg once daily.  She has not taken any of her medications today.  She is not short of breath walking on flat ground, but she gets short of breath walking up a flight of stairs or an incline.  No lightheadedness.   ? ?Labs (9/18): K 4.4, creatinine 0.81 ?Labs (10/18): K 4, creatinine 0.88 ?Labs (1/19): K 3.9, creatinine 0.66 ?Labs (10/19): K 3.9, creatinine 0.67 ?Labs (12/19): creatinine 1.14 ?Labs (10/20): LDL 93, K 4.3, creatinine 8.02 ?Labs (4/21): K 2.7, creatinine 0.97 ?Labs (6/22): K 3.6, creatinine 0.67 ?Labs (9/22): K 3.5, creatinine 0.66 ? ?PMH: ?1. Rheumatoid arthritis: Followed by Dr. Lendon Colonel.  ?2. Type II diabetes.  ?3. HTN ?4. Chronic systolic CHF: Nonischemic cardiomyopathy.  ?- LHC (2012): No significant CAD.  ?- Echo (7/13): EF 30-35% ?- Cardiac MRI (12/14): Mild LV dilation, EF 40%, normal  RV size and systolic function, no late gadolinium enhancement.  ?- Echo (7/16): EF 25-30% ?- Echo (8/18): EF 25-30% ?- RHC (10/18): mean RA 11, PA 47/20 mean 32, mean PCWP 16, CI 3.06 Fick/3.5 thermo, PVR 2.65 WU.  ?- Echo (10/19): EF 40-45%, diffuse hypokinesis, moderate diastolic dysfunction, normal RV size and systolic function  ?- Echo (2/21): EF 40-45%, diffuse hypokinesis.  ?- Echo (2/23): EF lower at 30-35%, mild-moderate LV dilation, normal RV.  ? ?SH: Lives with mother.  Rare ETOH, no drugs.  No smoking.  ? ?Family History  ?Problem Relation Age of Onset  ? Heart attack Mother   ? CAD Mother   ? Allergies Mother   ? Breast cancer Mother   ? Hypertension Other   ? Diabetes Mellitus II Other   ? ?ROS: All systems reviewed and negative except as per HPI.  ? ?Current Outpatient Medications  ?Medication Sig Dispense Refill  ? acetaminophen-codeine (TYLENOL #3) 300-30 MG tablet Take by mouth every 8 (eight) hours as needed for moderate pain.    ? albuterol (PROVENTIL) (2.5 MG/3ML) 0.083% nebulizer solution Take 2.5 mg by nebulization every 6 (six) hours as needed for wheezing or shortness of breath.    ? albuterol (VENTOLIN HFA) 108 (90 Base) MCG/ACT inhaler Inhale 2 puffs into the lungs every 6 (six) hours as needed for wheezing or shortness of breath.    ? benzonatate (TESSALON) 100 MG capsule Take 1 capsule (100 mg  total) by mouth as needed for cough. 20 capsule 1  ? betamethasone dipropionate (DIPROLENE) 0.05 % ointment Apply BID every other day to scalp. Avoid face, groin, underarms    ? BIDIL 20-37.5 MG tablet TAKE 2 TABLETS BY MOUTH THREE TIMES DAILY 540 tablet 3  ? cyclobenzaprine (FLEXERIL) 10 MG tablet Take 1 tablet (10 mg total) by mouth at bedtime as needed for muscle spasms. 8 tablet 0  ? dapagliflozin propanediol (FARXIGA) 10 MG TABS tablet Take 1 tablet (10 mg total) by mouth daily before breakfast. 30 tablet 11  ? ENTRESTO 97-103 MG Take 1 tablet by mouth twice daily 180 tablet 3  ? ferrous  gluconate (FERGON) 324 MG tablet Take 324 mg by mouth daily.    ? Fluocinolone Acetonide Scalp 0.01 % OIL     ? folic acid (FOLVITE) 1 MG tablet Take 1 mg by mouth daily.    ? leflunomide (ARAVA) 20 MG tablet Take 20 mg by mouth daily.    ? levocetirizine (XYZAL) 5 MG tablet Take 5 mg by mouth at bedtime.    ? metoprolol succinate (TOPROL-XL) 100 MG 24 hr tablet TAKE 1 TABLET BY MOUTH TWICE DAILY TAKE  WITH  OR  IMMEDIATELY  FOLLOWING  A  MEAL 180 tablet 3  ? omeprazole (PRILOSEC) 40 MG capsule Take 1 capsule (40 mg total) by mouth daily before breakfast. 30 capsule 0  ? potassium chloride SA (KLOR-CON) 20 MEQ tablet Take 1 tablet (20 mEq total) by mouth daily. 7 tablet 0  ? spironolactone (ALDACTONE) 25 MG tablet Take 1 tablet by mouth once daily 90 tablet 0  ? SYMBICORT 160-4.5 MCG/ACT inhaler Inhale 2 puffs into the lungs daily as needed (for respiratory issues.).     ? traMADol (ULTRAM) 50 MG tablet Take 50 mg by mouth every 4 (four) hours as needed for moderate pain or severe pain.    ? valACYclovir (VALTREX) 500 MG tablet Take 500 mg by mouth 2 (two) times daily as needed (for cold sores.).    ? furosemide (LASIX) 40 MG tablet Take 2 tablets (80 mg total) by mouth daily. 360 tablet 3  ? ?No current facility-administered medications for this encounter.  ? ?BP 122/70   Pulse 82   Wt 96.2 kg (212 lb 0.3 oz)   LMP 08/10/2019 (Exact Date)   SpO2 98%   BMI 38.78 kg/m?  ?General: NAD ?Neck: No JVD, no thyromegaly or thyroid nodule.  ?Lungs: Clear to auscultation bilaterally with normal respiratory effort. ?CV: Nondisplaced PMI.  Heart regular S1/S2, no S3/S4, no murmur.  No peripheral edema.  No carotid bruit.  Normal pedal pulses.  ?Abdomen: Soft, nontender, no hepatosplenomegaly, no distention.  ?Skin: Intact without lesions or rashes.  ?Neurologic: Alert and oriented x 3.  ?Psych: Normal affect. ?Extremities: No clubbing or cyanosis.  ?HEENT: Normal.  ? ?Assessment/Plan: ?1. Chronic systolic CHF: Nonischemic  cardiomyopathy.  Etiology uncertain, possible prior viral myocarditis.  CMRI in 12/14 with no LGE noted.  No heavy drinking or drugs.  No history of familial cardiomyopathy.  Echo in 2/21 showed stable EF 40-45%.  Echo today with EF lower at 30-35%, mild-moderate LV dilation, normal RV.  She has not been compliant with medications, stating that she takes them no more than 50% of the time.  NYHA class II symptoms, not volume overloaded.   ?- EF is < 35% but will not refer for ICD.  We need to reassess her LV function when compliant with meds as she got  her EF up to 40-45% in the past.  She will restart meds as she is supposed to take them and repeat echo in 3 months.  ?- She can keep Lasix at 80 mg daily, she does not appear volume overloaded.  ?- Continue spironolactone 25 mt daily.   ?- Continue Toprol XL 100 mg bid.   ?- Continue Bidil 2 tabs tid.   ?- Continue Entresto 97/103 bid.    ?- Continue dapagliflozin 10 mg daily ?- She needs BMET but has to take her mother to dialysis so cannot wait for it today.  She will come back tomorrow for BMET.  ?2. Rheumatoid arthritis: She sees a rheumatologist.  ? ?Followup with HF pharmacist in 3 wks to review meds and make sure that she is tolerating her full regimen.  Followup with me with echo in 3 months.  ? ?[name] ?06/11/2021 ? ? ? ?

## 2021-06-11 NOTE — Patient Instructions (Signed)
Thank you for your visit today. ? ?Please take all medications as prescribed. ? ?Decrease lasix to 80 mg daily ? ?Your physician recommends that you schedule a follow-up appointment in: 3 months  ? ?If you have any questions or concerns before your next appointment please send [place] a message through [place] or call our office at [phone].   ? ?TO LEAVE A MESSAGE FOR THE NURSE SELECT OPTION 2, PLEASE LEAVE A MESSAGE INCLUDING: ?YOUR NAME ?DATE OF BIRTH ?CALL BACK NUMBER ?REASON FOR CALL**this is important as we prioritize the call backs ? ?YOU WILL RECEIVE A CALL BACK THE SAME DAY AS LONG AS YOU CALL BEFORE 4:00 PM ? ?At the [facility], you and your health needs are our priority. As part of our continuing mission to provide you with exceptional heart care, we have created designated Provider Care Teams. These Care Teams include your primary Cardiologist (physician) and Advanced Practice Providers (APPs- Physician Assistants and Nurse Practitioners) who all work together to provide you with the care you need, when you need it.  ? ?You may see any of the following providers on your designated Care Team at your next follow up: ?Dr Arvilla Meres ?Dr Marca Ancona ?Tonye Becket, NP ?Robbie Lis, PA ?Jessica Milford,NP ?Anna Genre, PA ?Karle Plumber, PharmD ? ? ?Please be sure to bring in all your medications bottles to every appointment.  ? ? ?

## 2021-06-12 ENCOUNTER — Encounter (HOSPITAL_COMMUNITY): Payer: Self-pay

## 2021-06-12 ENCOUNTER — Other Ambulatory Visit (HOSPITAL_COMMUNITY): Payer: Medicare Other

## 2021-06-15 ENCOUNTER — Other Ambulatory Visit (HOSPITAL_COMMUNITY): Payer: Self-pay | Admitting: Cardiology

## 2021-06-16 ENCOUNTER — Other Ambulatory Visit (HOSPITAL_COMMUNITY): Payer: Medicare Other

## 2021-06-22 DIAGNOSIS — Z79899 Other long term (current) drug therapy: Secondary | ICD-10-CM | POA: Diagnosis not present

## 2021-06-22 DIAGNOSIS — M2550 Pain in unspecified joint: Secondary | ICD-10-CM | POA: Diagnosis not present

## 2021-06-22 DIAGNOSIS — M0600 Rheumatoid arthritis without rheumatoid factor, unspecified site: Secondary | ICD-10-CM | POA: Diagnosis not present

## 2021-06-24 ENCOUNTER — Other Ambulatory Visit (HOSPITAL_COMMUNITY): Payer: Self-pay

## 2021-07-05 ENCOUNTER — Other Ambulatory Visit (HOSPITAL_COMMUNITY): Payer: Self-pay | Admitting: Cardiology

## 2021-07-06 DIAGNOSIS — M25511 Pain in right shoulder: Secondary | ICD-10-CM | POA: Diagnosis not present

## 2021-07-07 NOTE — Progress Notes (Incomplete)
***In Progress*** ? ?  ?[facility] Note  ? ? ? ?PCP: Dr. Hyman Hopes ?Cardiology: Dr. Clifton James ?HF Cardiology: Dr. Shirlee Latch ?  ?HPI: 51 y.o. with history of nonischemic cardiomyopathy/chronic systolic CHF and rheumatoid arthritis was referred by Dr. Clifton James for evaluation/management of CHF.   ?  ?Patient has had a cardiomyopathy for a number of years.  Coronary angiography in 2012 showed no significant disease.  EF has been in the 25-30% range long-term. Echo 11/2016 showed EF remains 25-30%.  No family history of cardiomyopathy and denied alcohol and illicit drug use.  RHC in 01/2017 showed preserved cardiac output with mildly increased filling pressures.  Lasix was increased to 120 qam/80 qpm.  She was seen by EP (Camnitz) and decided against ICD.   ?  ?Echo in 01/2018 showed EF 40-45% with diffuse hypokinesis, normal RV.  ?  ?Echo in 05/2019 showed EF 40-45%, diffuse hypokinesis.  ?  ?Echo was done on 06/11/2021 and reviewed, EF lower at 30-35%, mild-moderate LV dilation, normal RV.  ?  ?She was last seen on 06/11/2021 for follow-up. Weight was down 7 lbs.  She admitted to taking her medications only about "50%" of the time.  Reported she used Lasix 80 mg once daily and she had not taken her other medications before this visit. Denied SOB walking on flat ground, but reported SOB walking up a flight of stairs or an incline.  Denied lightheadedness. ?   ?Today he returns to HF clinic for pharmacist medication titration. At last visit with MD, no medication changes were made and her meds were restarted.  ? ?Overall feeling ***. ?Dizziness, lightheadedness, fatigue:  ?Chest pain or palpitations: ? ?How is your breathing?: *** ?SOB: ?Able to complete all ADLs. Activity level *** ? ?Weight at home pounds. Takes furosemide/torsemide/bumex *** mg *** daily.  ?LEE ?PND/Orthopnea ? ?Appetite *** ?Low-salt diet:  ? ?Physical Exam ?Cost/affordability of meds ? ? ?HF Medications: ?Metoprolol succinate 100 mg  BID ?Entresto 97/103 mg BID ?Spironolactone 25 mg daily ?Farxiga 10 mg daily ?Furosemide 80 mg daily ? ?Has the patient been experiencing any side effects to the medications prescribed?  {YES NO:22349} ? ?Does the patient have any problems obtaining medications due to transportation or finances?   {YES NO:22349} ? ?Understanding of regimen: {excellent/good/fair/poor:19665} ?Understanding of indications: {excellent/good/fair/poor:19665} ?Potential of compliance: {excellent/good/fair/poor:19665} ?Patient understands to avoid NSAIDs. ?Patient understands to avoid decongestants. ?  ? ?Pertinent Lab Values: ?06/22/2021 - Serum creatinine 1.06, BUN 28, Potassium 5.3, Sodium 137 ? ?Vital Signs: ?Weight: *** (last clinic weight: 212 lbs.) ?Blood pressure: ***  ?Heart rate: ***  ? ?Assessment/Plan: ?1. Chronic systolic CHF: Nonischemic cardiomyopathy.  Etiology uncertain, possible prior viral myocarditis.  CMRI in 03/2013 with no LGE noted.  No heavy drinking or drugs.  No history of familial cardiomyopathy.  Echo in 05/2019 showed stable EF 40-45%.  Echo on 06/2021 with EF lower at 30-35%, mild-moderate LV dilation, normal RV. Reported noncompliance with medications, stating that she takes them no more than 50% of the time.  NYHA class II symptoms, not volume overloaded.   ?- EF is < 35% but will not refer for ICD.  We need to reassess her LV function when compliant with meds as she got her EF up to 40-45% in the past.  She will restart meds as she is supposed to take them and repeat echo in 3 months.  ?- Continue furosemide 80 mg daily. ?- Continue metoprolol 100 mg BID ?- Continue Entresto 97/103 mg BID. ?-  Continue spironolactone 25 mg daily. ?- Continue Farxiga 10 mg daily. ? ?2. Rheumatoid arthritis: She sees a rheumatologist.  ?  ?Followup with Dr. Shirlee Latch with Echo on 09/18/2021. ?  ? ? ?Karle Plumber, PharmD, BCPS, BCCP, CPP ?[facility] Pharmacist ?[phone] ? ? ?

## 2021-07-08 ENCOUNTER — Other Ambulatory Visit: Payer: Self-pay

## 2021-07-08 ENCOUNTER — Ambulatory Visit (HOSPITAL_COMMUNITY)
Admission: RE | Admit: 2021-07-08 | Discharge: 2021-07-08 | Disposition: A | Discharge status: Home / Self Care | Payer: Medicare Other | Source: Ambulatory Visit | Attending: Cardiology | Admitting: Cardiology

## 2021-07-08 VITALS — BP 116/70 | HR 77 | Wt 213.2 lb

## 2021-07-08 DIAGNOSIS — Z7984 Long term (current) use of oral hypoglycemic drugs: Secondary | ICD-10-CM | POA: Diagnosis not present

## 2021-07-08 DIAGNOSIS — I428 Other cardiomyopathies: Secondary | ICD-10-CM

## 2021-07-08 DIAGNOSIS — Z79899 Other long term (current) drug therapy: Secondary | ICD-10-CM | POA: Insufficient documentation

## 2021-07-08 DIAGNOSIS — M069 Rheumatoid arthritis, unspecified: Secondary | ICD-10-CM | POA: Diagnosis not present

## 2021-07-08 DIAGNOSIS — I5022 Chronic systolic (congestive) heart failure: Secondary | ICD-10-CM | POA: Diagnosis not present

## 2021-07-08 MED ORDER — METOPROLOL SUCCINATE ER 100 MG PO TB24
100.0000 mg | ORAL_TABLET | Freq: Every day | ORAL | 3 refills | Status: DC
Start: 2021-07-08 — End: 2022-11-01

## 2021-07-08 MED ORDER — FUROSEMIDE 40 MG PO TABS: 80.0000 mg | ORAL_TABLET | Freq: Every day | ORAL | 3 refills | Status: DC

## 2021-07-08 MED ORDER — SPIRONOLACTONE 25 MG PO TABS: 25.0000 mg | ORAL_TABLET | Freq: Every day | ORAL | 3 refills | Status: DC

## 2021-07-08 MED ORDER — ISOSORB DINITRATE-HYDRALAZINE 20-37.5 MG PO TABS: 2.0000 | ORAL_TABLET | Freq: Three times a day (TID) | ORAL | 3 refills | Status: DC

## 2021-07-08 MED ORDER — DAPAGLIFLOZIN PROPANEDIOL 10 MG PO TABS: 10.0000 mg | ORAL_TABLET | Freq: Every day | ORAL | 3 refills | Status: DC

## 2021-07-08 MED ORDER — ENTRESTO 97-103 MG PO TABS: 1.0000 | ORAL_TABLET | Freq: Two times a day (BID) | ORAL | 3 refills | Status: DC

## 2021-07-08 MED ORDER — POTASSIUM CHLORIDE CRYS ER 20 MEQ PO TBCR: 20.0000 meq | EXTENDED_RELEASE_TABLET | Freq: Every day | ORAL | 0 refills | Status: DC

## 2021-07-08 NOTE — Patient Instructions (Addendum)
It was a pleasure seeing you today! ? ?MEDICATIONS: ?-No medication changes today ?-Restart current medications as instructed ?-Call if you have questions about your medications. ? ?LABS: ?-Rechecking labs next week.  ? ?NEXT APPOINTMENT: ?Return to clinic in 1 week with lab on next Friday 4/7. ? ?In general, to take care of your heart failure: ?-Limit your fluid intake to 2 Liters (half-gallon) per day.   ?-Limit your salt intake to ideally 2-3 grams (2000-3000 mg) per day. ?-Weigh yourself daily and record, and bring that "weight diary" to your next appointment.  (Weight gain of 2-3 pounds in 1 day typically means fluid weight.) ?-The medications for your heart are to help your heart and help you live longer.   ?-Please contact us before stopping any of your heart medications. ? ?Call the clinic at [phone] with questions or to reschedule future appointments. ?

## 2021-07-08 NOTE — Progress Notes (Signed)
?  ?[facility] Note  ? ?PCP: Dr. Hyman Hopes ?Cardiology: Dr. Clifton James ?HF Cardiology: Dr. Shirlee Latch ?  ?HPI: 51 y.o. with history of nonischemic cardiomyopathy/chronic systolic CHF and rheumatoid arthritis was referred by Dr. Clifton James for evaluation/management of CHF.   ? ?Patient has had a cardiomyopathy for a number of years. Coronary angiography in 2012 showed no significant disease.  EF has been in the 25-30% range long-term. Echo 11/2016 showed EF remains 25-30%.  No family history of cardiomyopathy and denied alcohol and illicit drug use.  RHC in 01/2017 showed preserved cardiac output with mildly increased filling pressures.  Lasix was increased to 120 mg qAM/80 mg qPM.  She was seen by EP (Camnitz) and decided against ICD.   ? ?Echo in 01/2018 showed EF 40-45% with diffuse hypokinesis, normal RV.  ?  ?Echo in 05/2019 showed EF 40-45%, diffuse hypokinesis.  ?  ?Echo was done on 06/11/2021 and reviewed, EF lower at 30-35%, mild-moderate LV dilation, normal RV.  ? ?She was last seen in clinic by Dr. Shirlee Latch on 06/11/2021 for follow-up. Weight was down 7 lbs.  She admitted to taking her medications only about "50%" of the time.  Reported taking Lasix 80 mg once daily, and she had not taken her other medications before that visit. Denied SOB walking on flat ground, but reported SOB walking up a flight of stairs or an incline. Denied lightheadedness. ? ?Today, she returns to HF clinic for pharmacist medication titration. At last visit with MD, no medication changes were made due to non-adherence and her medications were restarted. She reports overall feeling better since her last visit since she has resumed taking her medications every day. She has more energy and able to do normal activities. Had some dizziness when she started minoxidil (for alopecia) but it resolved after a couple days. No lightheadedness, fatigue, chest pain, palpitations. Breathing has improved. Endorses a little SOB with an incline or  flight of stairs. She asks if she is carrying fluid since she is peeing more lately but does not notice any fluid in her legs. Explained that she is likely peeing more lately because she has restarted her medications as a whole. Takes 80 mg furosemide daily. No PND. Sleeps on 3 pillows which is stable for her. Has a smaller appetite since starting Mounjaro per PCP. Does admit to eating more salt lately. She requests refills for all of her HF medications. Notes that when she had labs checked at her PCP they told her to increase her potassium so she has doubled it (40 mEq) then ran out 2 weeks ago. Per [facility], it appears her K was 4.6 at her PCP so it is unclear why they increased the dose. K at rheumatologist after this was 5.3. Denies any missed doses with her other medications but has not yet taken them today as she takes her morning doses at 11am.  ? ?HF Medications: ?Metoprolol succinate 100 mg BID - pt taking daily ?Entresto 97/103 mg BID ?Spironolactone 25 mg daily ?Farxiga 10 mg daily ?Bidil 20-37.5 mg - 2 tabs TID ?Furosemide 80 mg daily ? ?Has the patient been experiencing any side effects to the medications prescribed? No ? ?Does the patient have any problems obtaining medications due to transportation or finances?   No - patient reports having [employer] with $0 copays for all medications. Thinks she has dual complete and extra help.  ? ?Understanding of regimen: good ?Understanding of indications: good ?Potential of compliance: good ?Patient understands  to avoid NSAIDs. ?Patient understands to avoid decongestants. ?  ?Pertinent Lab Values: ?06/22/2021 - Serum creatinine 1.06, BUN 28, Potassium 5.3, Sodium 137 ? ?Vital Signs: ?Weight: 213.2 lbs (last clinic weight: 212 lbs) ?Blood pressure: 116/70 mmHg ?Heart rate: 77 bpm ? ?Assessment/Plan: ?1. Chronic systolic CHF: Nonischemic cardiomyopathy.  Etiology uncertain, possible prior viral myocarditis.  CMRI in 03/2013 with no LGE noted.  No heavy  drinking or drugs.  No history of familial cardiomyopathy.  Echo in 05/2019 showed stable EF 40-45%.  Echo on 06/2021 with EF lower at 30-35%, mild-moderate LV dilation, normal RV.  ?- NYHA class II symptoms, not volume overloaded.   ?- EF is < 35% but will not refer for ICD.  We need to reassess her LV function when compliant with meds for 3 months as she got her EF up to 40-45% in the past. She reports improved compliance today.  ?- Continue furosemide 80 mg daily. Restart KCl 20 mEq daily. Check BMET in 1 week.   ?- Continue metoprolol succinate 100 mg daily (as she is currently taking it). Prescribed as 100 mg BID but she has only been taking it daily. Medication list updated to reflect how she is actually taking the medication.  ?- Continue Entresto 97/103 mg BID.  ?- Continue spironolactone 25 mg daily.  ?- Continue Farxiga 10 mg daily.  ?- Continue Bidil 20-37.5 mg - 2 tabs TID.  ? ?2. Rheumatoid arthritis: She sees a rheumatologist.  ? ?Follow up lab appt on 07/17/21 for BMET. Next visit with Dr. Shirlee Latch with echo on 09/18/2021. ? ?Karle Plumber, PharmD, BCPS, BCCP, CPP ?Heart Failure Clinic Pharmacist ?[phone] ?

## 2021-07-13 DIAGNOSIS — E1169 Type 2 diabetes mellitus with other specified complication: Secondary | ICD-10-CM | POA: Diagnosis not present

## 2021-07-13 DIAGNOSIS — R051 Acute cough: Secondary | ICD-10-CM | POA: Diagnosis not present

## 2021-07-13 DIAGNOSIS — Z03818 Encounter for observation for suspected exposure to other biological agents ruled out: Secondary | ICD-10-CM | POA: Diagnosis not present

## 2021-07-17 ENCOUNTER — Other Ambulatory Visit (HOSPITAL_COMMUNITY): Payer: Medicare Other

## 2021-07-24 ENCOUNTER — Other Ambulatory Visit (HOSPITAL_COMMUNITY): Payer: Medicare Other

## 2021-07-28 DIAGNOSIS — L2089 Other atopic dermatitis: Secondary | ICD-10-CM | POA: Diagnosis not present

## 2021-07-28 DIAGNOSIS — R509 Fever, unspecified: Secondary | ICD-10-CM | POA: Diagnosis not present

## 2021-07-28 DIAGNOSIS — R051 Acute cough: Secondary | ICD-10-CM | POA: Diagnosis not present

## 2021-07-28 DIAGNOSIS — Z03818 Encounter for observation for suspected exposure to other biological agents ruled out: Secondary | ICD-10-CM | POA: Diagnosis not present

## 2021-07-28 DIAGNOSIS — L648 Other androgenic alopecia: Secondary | ICD-10-CM | POA: Diagnosis not present

## 2021-07-29 ENCOUNTER — Ambulatory Visit
Admission: RE | Admit: 2021-07-29 | Discharge: 2021-07-29 | Disposition: A | Discharge status: Home / Self Care | Payer: Medicare Other | Source: Ambulatory Visit | Attending: Family Medicine | Admitting: Family Medicine

## 2021-07-29 ENCOUNTER — Other Ambulatory Visit: Payer: Self-pay | Admitting: Family Medicine

## 2021-07-29 DIAGNOSIS — J189 Pneumonia, unspecified organism: Secondary | ICD-10-CM

## 2021-07-29 DIAGNOSIS — R059 Cough, unspecified: Secondary | ICD-10-CM | POA: Diagnosis not present

## 2021-08-05 DIAGNOSIS — D508 Other iron deficiency anemias: Secondary | ICD-10-CM | POA: Diagnosis not present

## 2021-08-05 DIAGNOSIS — I5022 Chronic systolic (congestive) heart failure: Secondary | ICD-10-CM | POA: Diagnosis not present

## 2021-08-05 DIAGNOSIS — E1169 Type 2 diabetes mellitus with other specified complication: Secondary | ICD-10-CM | POA: Diagnosis not present

## 2021-08-05 DIAGNOSIS — I428 Other cardiomyopathies: Secondary | ICD-10-CM | POA: Diagnosis not present

## 2021-08-05 DIAGNOSIS — E538 Deficiency of other specified B group vitamins: Secondary | ICD-10-CM | POA: Diagnosis not present

## 2021-08-07 ENCOUNTER — Telehealth (HOSPITAL_COMMUNITY): Payer: Self-pay

## 2021-08-07 ENCOUNTER — Ambulatory Visit (HOSPITAL_COMMUNITY)
Admission: RE | Admit: 2021-08-07 | Discharge: 2021-08-07 | Disposition: A | Discharge status: Home / Self Care | Payer: Medicare Other | Source: Ambulatory Visit | Attending: Cardiology | Admitting: Cardiology

## 2021-08-07 DIAGNOSIS — I5022 Chronic systolic (congestive) heart failure: Secondary | ICD-10-CM

## 2021-08-07 LAB — BASIC METABOLIC PANEL
Anion gap: 6 (ref 5–15)
BUN: 22 mg/dL — ABNORMAL HIGH (ref 6–20)
CO2: 25 mmol/L (ref 22–32)
Calcium: 9.1 mg/dL (ref 8.9–10.3)
Chloride: 108 mmol/L (ref 98–111)
Creatinine, Ser: 1.2 mg/dL — ABNORMAL HIGH (ref 0.44–1.00)
GFR, Estimated: 55 mL/min — ABNORMAL LOW (ref >60)
Glucose, Bld: 128 mg/dL — ABNORMAL HIGH (ref 70–99)
Potassium: 4.2 mmol/L (ref 3.5–5.1)
Sodium: 139 mmol/L (ref 135–145)

## 2021-08-07 LAB — BRAIN NATRIURETIC PEPTIDE: B Natriuretic Peptide: 131.5 pg/mL — ABNORMAL HIGH (ref 0.0–100.0)

## 2021-08-07 MED ORDER — FUROSEMIDE 40 MG PO TABS: 40.0000 mg | ORAL_TABLET | Freq: Every day | ORAL | 3 refills | Status: DC

## 2021-08-07 NOTE — Telephone Encounter (Signed)
Called patient to inform her that her Scr increased after resuming full-dose Entresto. Instructed patient to hold 1 dose of furosemide and then decrease furosemide 80 mg daily to 40 mg daily with an additional 40 mg PRN weight gain/edema. Patient verbalized understanding. Repeat BMET in 1 week scheduled.  ?

## 2021-08-07 NOTE — Addendum Note (Signed)
Addended by: Drake Leach A on: 08/07/2021 03:31 PM ? ? Modules accepted: Orders ? ?

## 2021-08-10 ENCOUNTER — Telehealth (HOSPITAL_COMMUNITY): Payer: Self-pay | Admitting: Vascular Surgery

## 2021-08-10 NOTE — Telephone Encounter (Signed)
Returned pt call to resch appt  ?

## 2021-08-14 ENCOUNTER — Other Ambulatory Visit (HOSPITAL_COMMUNITY): Payer: Medicare Other

## 2021-08-21 ENCOUNTER — Ambulatory Visit (HOSPITAL_COMMUNITY)
Admission: RE | Admit: 2021-08-21 | Discharge: 2021-08-21 | Disposition: A | Discharge status: Home / Self Care | Payer: Medicare Other | Source: Ambulatory Visit | Attending: Internal Medicine | Admitting: Internal Medicine

## 2021-08-21 DIAGNOSIS — I5022 Chronic systolic (congestive) heart failure: Secondary | ICD-10-CM | POA: Insufficient documentation

## 2021-08-21 LAB — BASIC METABOLIC PANEL
Anion gap: 7 (ref 5–15)
BUN: 20 mg/dL (ref 6–20)
CO2: 24 mmol/L (ref 22–32)
Calcium: 9.2 mg/dL (ref 8.9–10.3)
Chloride: 107 mmol/L (ref 98–111)
Creatinine, Ser: 1.09 mg/dL — ABNORMAL HIGH (ref 0.44–1.00)
GFR, Estimated: >60 mL/min (ref >60)
Glucose, Bld: 99 mg/dL (ref 70–99)
Potassium: 4.7 mmol/L (ref 3.5–5.1)
Sodium: 138 mmol/L (ref 135–145)

## 2021-08-24 DIAGNOSIS — M2550 Pain in unspecified joint: Secondary | ICD-10-CM | POA: Diagnosis not present

## 2021-08-24 DIAGNOSIS — M0600 Rheumatoid arthritis without rheumatoid factor, unspecified site: Secondary | ICD-10-CM | POA: Diagnosis not present

## 2021-09-18 ENCOUNTER — Other Ambulatory Visit (HOSPITAL_COMMUNITY): Payer: Medicare Other

## 2021-09-18 ENCOUNTER — Encounter (HOSPITAL_COMMUNITY): Payer: Medicare Other | Admitting: Cardiology

## 2021-09-23 DIAGNOSIS — I5022 Chronic systolic (congestive) heart failure: Secondary | ICD-10-CM | POA: Diagnosis not present

## 2021-09-23 DIAGNOSIS — I10 Essential (primary) hypertension: Secondary | ICD-10-CM | POA: Diagnosis not present

## 2021-09-23 DIAGNOSIS — R42 Dizziness and giddiness: Secondary | ICD-10-CM | POA: Diagnosis not present

## 2021-09-23 DIAGNOSIS — E11620 Type 2 diabetes mellitus with diabetic dermatitis: Secondary | ICD-10-CM | POA: Diagnosis not present

## 2021-09-28 DIAGNOSIS — K5900 Constipation, unspecified: Secondary | ICD-10-CM | POA: Diagnosis not present

## 2021-10-12 DIAGNOSIS — G479 Sleep disorder, unspecified: Secondary | ICD-10-CM | POA: Diagnosis not present

## 2021-10-12 DIAGNOSIS — E1169 Type 2 diabetes mellitus with other specified complication: Secondary | ICD-10-CM | POA: Diagnosis not present

## 2021-10-12 DIAGNOSIS — J45909 Unspecified asthma, uncomplicated: Secondary | ICD-10-CM | POA: Diagnosis not present

## 2021-10-12 DIAGNOSIS — K30 Functional dyspepsia: Secondary | ICD-10-CM | POA: Diagnosis not present

## 2021-10-12 DIAGNOSIS — M5442 Lumbago with sciatica, left side: Secondary | ICD-10-CM | POA: Diagnosis not present

## 2021-10-17 ENCOUNTER — Emergency Department (HOSPITAL_BASED_OUTPATIENT_CLINIC_OR_DEPARTMENT_OTHER)
Admission: EM | Admit: 2021-10-17 | Discharge: 2021-10-17 | Disposition: A | Discharge status: Home / Self Care | Payer: Medicare Other | Attending: Emergency Medicine | Admitting: Emergency Medicine

## 2021-10-17 ENCOUNTER — Other Ambulatory Visit: Payer: Self-pay

## 2021-10-17 ENCOUNTER — Encounter (HOSPITAL_BASED_OUTPATIENT_CLINIC_OR_DEPARTMENT_OTHER): Payer: Self-pay | Admitting: Emergency Medicine

## 2021-10-17 DIAGNOSIS — R519 Headache, unspecified: Secondary | ICD-10-CM

## 2021-10-17 DIAGNOSIS — E119 Type 2 diabetes mellitus without complications: Secondary | ICD-10-CM | POA: Diagnosis not present

## 2021-10-17 DIAGNOSIS — J45909 Unspecified asthma, uncomplicated: Secondary | ICD-10-CM | POA: Insufficient documentation

## 2021-10-17 DIAGNOSIS — Z79899 Other long term (current) drug therapy: Secondary | ICD-10-CM | POA: Insufficient documentation

## 2021-10-17 DIAGNOSIS — I10 Essential (primary) hypertension: Secondary | ICD-10-CM | POA: Insufficient documentation

## 2021-10-17 DIAGNOSIS — Z7984 Long term (current) use of oral hypoglycemic drugs: Secondary | ICD-10-CM | POA: Insufficient documentation

## 2021-10-17 DIAGNOSIS — Z20822 Contact with and (suspected) exposure to covid-19: Secondary | ICD-10-CM | POA: Diagnosis not present

## 2021-10-17 LAB — COMPREHENSIVE METABOLIC PANEL
ALT: 14 U/L (ref 0–44)
AST: 21 U/L (ref 15–41)
Albumin: 3.9 g/dL (ref 3.5–5.0)
Alkaline Phosphatase: 86 U/L (ref 38–126)
Anion gap: 5 (ref 5–15)
BUN: 27 mg/dL — ABNORMAL HIGH (ref 6–20)
CO2: 26 mmol/L (ref 22–32)
Calcium: 9.5 mg/dL (ref 8.9–10.3)
Chloride: 108 mmol/L (ref 98–111)
Creatinine, Ser: 0.85 mg/dL (ref 0.44–1.00)
GFR, Estimated: >60 mL/min (ref >60)
Glucose, Bld: 117 mg/dL — ABNORMAL HIGH (ref 70–99)
Potassium: 3.4 mmol/L — ABNORMAL LOW (ref 3.5–5.1)
Sodium: 139 mmol/L (ref 135–145)
Total Bilirubin: 0.5 mg/dL (ref 0.3–1.2)
Total Protein: 9.4 g/dL — ABNORMAL HIGH (ref 6.5–8.1)

## 2021-10-17 LAB — URINALYSIS, ROUTINE W REFLEX MICROSCOPIC
Bilirubin Urine: NEGATIVE
Glucose, UA: >=500 mg/dL — AB
Hgb urine dipstick: NEGATIVE
Ketones, ur: NEGATIVE mg/dL
Leukocytes,Ua: NEGATIVE
Nitrite: NEGATIVE
Protein, ur: 30 mg/dL — AB
Specific Gravity, Urine: 1.02 (ref 1.005–1.030)
pH: 5.5 (ref 5.0–8.0)

## 2021-10-17 LAB — CBC
HCT: 31.4 % — ABNORMAL LOW (ref 36.0–46.0)
Hemoglobin: 9.8 g/dL — ABNORMAL LOW (ref 12.0–15.0)
MCH: 28.2 pg (ref 26.0–34.0)
MCHC: 31.2 g/dL (ref 30.0–36.0)
MCV: 90.5 fL (ref 80.0–100.0)
Platelets: 385 10*3/uL (ref 150–400)
RBC: 3.47 MIL/uL — ABNORMAL LOW (ref 3.87–5.11)
RDW: 17 % — ABNORMAL HIGH (ref 11.5–15.5)
WBC: 8 10*3/uL (ref 4.0–10.5)
nRBC: 0 % (ref 0.0–0.2)

## 2021-10-17 LAB — URINALYSIS, MICROSCOPIC (REFLEX)

## 2021-10-17 LAB — SARS CORONAVIRUS 2 BY RT PCR: SARS Coronavirus 2 by RT PCR: NEGATIVE

## 2021-10-17 MED ORDER — ACETAMINOPHEN 500 MG PO TABS
1000.0000 mg | ORAL_TABLET | Freq: Once | ORAL | Status: AC
  Administered 2021-10-17: 1000 mg via ORAL
  Filled 2021-10-17: qty 2

## 2021-10-17 NOTE — ED Notes (Signed)
Pt denies pain at this time; sts HA started suddenly when she found her brother deceased this morning; she administered CPR after pulling him out of the bathroom

## 2021-10-17 NOTE — ED Triage Notes (Signed)
Pt arrives pov, steady gait, c/o HA, n/v/d and chills today.Recent family member passed away. Pt denies CP, Pt is AOx4

## 2021-10-17 NOTE — ED Provider Notes (Signed)
[facility] EMERGENCY DEPARTMENT Provider Note   CSN: [id number] Arrival date & time: 10/17/21  1217     History  Chief Complaint  Patient presents with   Headache    [name] is a 51 y.o. female.  The history is provided by the patient and medical records. No language interpreter was used.  Headache   51 year old female significant history of hypertension, diabetes, asthma rheumatoid arthritis who presents with complaint of headache.  Patient report this morning she found her brother dead in the bathroom.  She performed CPR on him but unable to revive him.  She reports she was very distraught and subsequently developed headache.  She described headache as a tight band around her head.  States she felt nauseous and subsequently antibiotic vomiting as well as urinary incontinence.  She tries taking several Tylenols but vomited that up.  Since being in the ER, she report headache has steadily improving and she felt a bit better.  She did not take her morning medication.  She felt fine yesterday.  She denies any recent sickness.  She does not endorse any chest pain or abdominal pain or neck stiffness fever or rash  Home Medications Prior to Admission medications   Medication Sig Start Date End Date Taking? Authorizing Provider  acetaminophen-codeine (TYLENOL #3) 300-30 MG tablet Take by mouth every 8 (eight) hours as needed for moderate pain.    [provider]  albuterol (PROVENTIL) (2.5 MG/3ML) 0.083% nebulizer solution Take 2.5 mg by nebulization every 6 (six) hours as needed for wheezing or shortness of breath.    [provider]  albuterol (VENTOLIN HFA) 108 (90 Base) MCG/ACT inhaler Inhale 2 puffs into the lungs every 6 (six) hours as needed for wheezing or shortness of breath.    [provider]  benzonatate (TESSALON) 100 MG capsule Take 1 capsule (100 mg total) by mouth as needed for cough. 02/26/21   Clegg, Amy D, NP  betamethasone  dipropionate (DIPROLENE) 0.05 % ointment Apply BID every other day to scalp. Avoid face, groin, underarms 05/03/19   [provider]  cholecalciferol (VITAMIN D3) 25 MCG (1000 UNIT) tablet Take 25 mcg by mouth daily.    [provider]  cyclobenzaprine (FLEXERIL) 10 MG tablet Take 1 tablet (10 mg total) by mouth at bedtime as needed for muscle spasms. 01/29/21   Placido Sou, PA-C  dapagliflozin propanediol (FARXIGA) 10 MG TABS tablet Take 1 tablet (10 mg total) by mouth daily before breakfast. 07/08/21   Laurey Morale, MD  ferrous gluconate (FERGON) 324 MG tablet Take 324 mg by mouth daily.    [provider]  Fluocinolone Acetonide Scalp 0.01 % OIL  04/26/19   [provider]  fluticasone (FLONASE) 50 MCG/ACT nasal spray Place 2 sprays into both nostrils daily as needed. 04/07/21   [provider]  folic acid (FOLVITE) 1 MG tablet Take 1 mg by mouth daily.    [provider]  furosemide (LASIX) 40 MG tablet Take 1 tablet (40 mg total) by mouth daily. 08/07/21   Laurey Morale, MD  gabapentin (NEURONTIN) 600 MG tablet Take 600 mg by mouth daily as needed. 03/13/21   [provider]  isosorbide-hydrALAZINE (BIDIL) 20-37.5 MG tablet Take 2 tablets by mouth 3 (three) times daily. 07/08/21   Laurey Morale, MD  leflunomide (ARAVA) 20 MG tablet Take 20 mg by mouth daily. 03/23/19   [provider]  levocetirizine (XYZAL) 5 MG tablet Take 5 mg  by mouth at bedtime.    [provider]  meloxicam (MOBIC) 15 MG tablet Take 15 mg by mouth daily as needed. 06/26/21   [provider]  methotrexate 2.5 MG tablet Take 10 mg by mouth once a week. 06/25/21   [provider]  metoprolol succinate (TOPROL-XL) 100 MG 24 hr tablet Take 1 tablet (100 mg total) by mouth daily. 07/08/21   Laurey Morale, MD  minoxidil (LONITEN) 2.5 MG tablet Take 1.25 mg by mouth daily. 06/09/21   [provider]  montelukast  (SINGULAIR) 10 MG tablet Take 10 mg by mouth every morning. 05/17/21   [provider]  MOUNJARO 5 MG/0.5ML Pen Inject 5 mg into the skin once a week. 06/16/21   [provider]  omeprazole (PRILOSEC) 40 MG capsule Take 1 capsule (40 mg total) by mouth daily before breakfast. 03/28/18   Rodolph Bong, MD  potassium chloride SA (KLOR-CON M) 20 MEQ tablet Take 1 tablet (20 mEq total) by mouth daily. 07/08/21   Laurey Morale, MD  predniSONE (DELTASONE) 5 MG tablet Take 5 mg by mouth daily. 06/22/21   [provider]  sacubitril-valsartan (ENTRESTO) 97-103 MG Take 1 tablet by mouth 2 (two) times daily. 07/08/21   Laurey Morale, MD  spironolactone (ALDACTONE) 25 MG tablet Take 1 tablet (25 mg total) by mouth daily. 07/08/21   Laurey Morale, MD  SYMBICORT 160-4.5 MCG/ACT inhaler Inhale 2 puffs into the lungs daily as needed (for respiratory issues.).  06/01/13   [provider]  traMADol (ULTRAM) 50 MG tablet Take 50 mg by mouth every 4 (four) hours as needed for moderate pain or severe pain.    [provider]  traZODone (DESYREL) 50 MG tablet Take 50 mg by mouth at bedtime. 05/21/21   [provider]  valACYclovir (VALTREX) 500 MG tablet Take 500 mg by mouth 2 (two) times daily as needed (for cold sores.).    [provider]      Allergies    Clarithromycin, Hydrocodone, Ondansetron, Penicillins, and Tdap [tetanus-diphth-acell pertussis]    Review of Systems   Review of Systems  Neurological:  Positive for headaches.  All other systems reviewed and are negative.   Physical Exam Updated Vital Signs BP 129/73 (BP Location: Left Arm)   Pulse 69   Temp 97.7 F (36.5 C) (Oral)   Resp (!) 22   Ht 5\' 3"  (1.6 m)   Wt 88 kg   LMP 08/10/2019 (Exact Date)   SpO2 100%   BMI 34.37 kg/m  Physical Exam Vitals and nursing note reviewed.  Constitutional:      General: She is not in acute distress.    Appearance: She is  well-developed.  HENT:     Head: Normocephalic and atraumatic.  Eyes:     Conjunctiva/sclera: Conjunctivae normal.     Comments: Eyelids are puffy bilaterally  Cardiovascular:     Rate and Rhythm: Normal rate and regular rhythm.     Heart sounds: Normal heart sounds.  Pulmonary:     Effort: Pulmonary effort is normal.  Abdominal:     Palpations: Abdomen is soft.     Tenderness: There is no abdominal tenderness.  Musculoskeletal:     Cervical back: Neck supple.  Skin:    Findings: No rash.  Neurological:     Mental Status: She is alert and oriented to person, place, and time.     GCS: GCS eye subscore is 4. GCS verbal subscore  is 5. GCS motor subscore is 6.     Cranial Nerves: Cranial nerves 2-12 are intact.     Motor: Motor function is intact.  Psychiatric:        Mood and Affect: Mood normal.     ED Results / Procedures / Treatments   Labs (all labs ordered are listed, but only abnormal results are displayed) Labs Reviewed  COMPREHENSIVE METABOLIC PANEL - Abnormal; Notable for the following components:      Result Value   Potassium 3.4 (*)    Glucose, Bld 117 (*)    BUN 27 (*)    Total Protein 9.4 (*)    All other components within normal limits  CBC - Abnormal; Notable for the following components:   RBC 3.47 (*)    Hemoglobin 9.8 (*)    HCT 31.4 (*)    RDW 17.0 (*)    All other components within normal limits  URINALYSIS, ROUTINE W REFLEX MICROSCOPIC - Abnormal; Notable for the following components:   Glucose, UA >=500 (*)    Protein, ur 30 (*)    All other components within normal limits  URINALYSIS, MICROSCOPIC (REFLEX) - Abnormal; Notable for the following components:   Bacteria, UA FEW (*)    All other components within normal limits  SARS CORONAVIRUS 2 BY RT PCR    EKG None  Radiology No results found.  Procedures Procedures    Medications Ordered in ED Medications  acetaminophen (TYLENOL) tablet 1,000 mg (1,000 mg Oral Given 10/17/21 1357)     ED Course/ Medical Decision Making/ A&P                           Medical Decision Making Amount and/or Complexity of Data Reviewed Labs: ordered.  Risk OTC drugs.   BP 129/73 (BP Location: Left Arm)   Pulse 69   Temp 97.7 F (36.5 C) (Oral)   Resp (!) 22   Ht 5\' 3"  (1.6 m)   Wt 88 kg   LMP 08/10/2019 (Exact Date)   SpO2 100%   BMI 34.37 kg/m   1:50 PM This is a 51 year old female who presenting complaining of headache shortly after she found her brother dead in the bathroom this morning.  She attempted to revive him by doing chest compression without success.  She later reported feeling throbbing bandlike headache across her head and states she felt nauseous and vomiting and also have some urinary incontinence.  She has been crying.  She tries taking Tylenol but unable to keep it down.  On exam patient is well.  Appears to be in no acute discomfort.  She does have puffy eyelids likely from crying.  She does not have any nuchal rigidity.  There is no red flags to suggest meningitis subarachnoid hemorrhage, stroke, or intra cranial pathology.  She is afebrile, normal blood pressure, not tachycardic, no hypoxia.  Suspect headache is likely a stress response.  Will give Tylenol, will monitor closely.  I have low suspicion for subarachnoid hemorrhage causing her headache.  No acute onset thunderclap headache.  3:53 PM Labs obtained independently viewed interpreted by me and are reassuring.  No concerning electrolyte imbalance, anemia improved from prior, COVID test obtained and is negative.  On reassessment after patient receiving Tylenol she reported feeling better and request to be discharged home.  At this time I felt patient stable to be discharged and return precaution was given.  I have reviewed patient's prior EMR  and considered in plan of care.  I have considered patient's social determinants of health and did not see any barriers to health.        Final Clinical  Impression(s) / ED Diagnoses Final diagnoses:  Bad headache    Rx / DC Orders ED Discharge Orders     None         Fayrene Helper, PA-C 10/17/21 1557    Glynn Octave, MD 10/18/21 1704

## 2021-10-17 NOTE — ED Provider Notes (Signed)
ADDENDUM: pt discharged home.  After discussion with Dr. Manus Gunning I have called pt to check up on her. Her headache has mostly resolved.  I discussed possibility of SAH and encourage pt to return for head CT scan and further work up to rule out.  She voice understanding but felt she will return if her sxs worsen.  Pt understand the risk and appreciate the call.  Pt did reiterate that her headache is gradual and not thunderclap    Fayrene Helper, PA-C 10/17/21 1735    Glynn Octave, MD 10/18/21 1705

## 2021-10-17 NOTE — Discharge Instructions (Addendum)
Please take Tylenol or ibuprofen as needed for your headache.  My condolences to your loss.

## 2021-12-10 DIAGNOSIS — M2550 Pain in unspecified joint: Secondary | ICD-10-CM | POA: Diagnosis not present

## 2021-12-23 ENCOUNTER — Encounter (HOSPITAL_COMMUNITY): Payer: Medicare Other | Admitting: Cardiology

## 2021-12-23 ENCOUNTER — Ambulatory Visit (HOSPITAL_COMMUNITY): Payer: Medicare Other

## 2021-12-25 DIAGNOSIS — D649 Anemia, unspecified: Secondary | ICD-10-CM | POA: Diagnosis not present

## 2021-12-25 DIAGNOSIS — L089 Local infection of the skin and subcutaneous tissue, unspecified: Secondary | ICD-10-CM | POA: Diagnosis not present

## 2021-12-25 DIAGNOSIS — M0609 Rheumatoid arthritis without rheumatoid factor, multiple sites: Secondary | ICD-10-CM | POA: Diagnosis not present

## 2021-12-25 DIAGNOSIS — E1169 Type 2 diabetes mellitus with other specified complication: Secondary | ICD-10-CM | POA: Diagnosis not present

## 2021-12-25 DIAGNOSIS — I5022 Chronic systolic (congestive) heart failure: Secondary | ICD-10-CM | POA: Diagnosis not present

## 2021-12-25 DIAGNOSIS — R779 Abnormality of plasma protein, unspecified: Secondary | ICD-10-CM | POA: Diagnosis not present

## 2021-12-29 ENCOUNTER — Ambulatory Visit (HOSPITAL_COMMUNITY): Payer: Medicare Other

## 2022-01-05 DIAGNOSIS — N3000 Acute cystitis without hematuria: Secondary | ICD-10-CM | POA: Diagnosis not present

## 2022-01-05 DIAGNOSIS — G479 Sleep disorder, unspecified: Secondary | ICD-10-CM | POA: Diagnosis not present

## 2022-01-06 ENCOUNTER — Ambulatory Visit (HOSPITAL_COMMUNITY): Payer: Medicare Other

## 2022-01-08 ENCOUNTER — Telehealth: Payer: Self-pay | Admitting: Oncology

## 2022-01-08 NOTE — Telephone Encounter (Signed)
Attempted to contact patient in regards to scheduling initial visit from referral , no answer so voicemail left  

## 2022-01-12 ENCOUNTER — Telehealth: Payer: Self-pay | Admitting: Oncology

## 2022-01-12 NOTE — Telephone Encounter (Signed)
Attempted to contact patient in regards to scheduling initial visit from referral , no answer so voicemail left

## 2022-01-15 ENCOUNTER — Telehealth: Payer: Self-pay | Admitting: Oncology

## 2022-01-15 DIAGNOSIS — M0600 Rheumatoid arthritis without rheumatoid factor, unspecified site: Secondary | ICD-10-CM | POA: Diagnosis not present

## 2022-01-15 DIAGNOSIS — Z79899 Other long term (current) drug therapy: Secondary | ICD-10-CM | POA: Diagnosis not present

## 2022-01-15 DIAGNOSIS — M2550 Pain in unspecified joint: Secondary | ICD-10-CM | POA: Diagnosis not present

## 2022-01-15 NOTE — Telephone Encounter (Signed)
Final attempted to contact- no answer voicemail was left advising patient to contact us when she is able too, referral will be closed and referring provider will be let known. We will reopen referral when she is ready to schedule.

## 2022-01-21 ENCOUNTER — Ambulatory Visit (HOSPITAL_COMMUNITY): Payer: Medicare Other

## 2022-01-28 ENCOUNTER — Encounter (HOSPITAL_COMMUNITY): Payer: Medicare Other | Admitting: Cardiology

## 2022-01-28 ENCOUNTER — Ambulatory Visit (HOSPITAL_COMMUNITY): Payer: Medicare Other

## 2022-02-02 DIAGNOSIS — Z23 Encounter for immunization: Secondary | ICD-10-CM | POA: Diagnosis not present

## 2022-02-02 DIAGNOSIS — J0100 Acute maxillary sinusitis, unspecified: Secondary | ICD-10-CM | POA: Diagnosis not present

## 2022-02-02 DIAGNOSIS — E1169 Type 2 diabetes mellitus with other specified complication: Secondary | ICD-10-CM | POA: Diagnosis not present

## 2022-02-02 DIAGNOSIS — I5022 Chronic systolic (congestive) heart failure: Secondary | ICD-10-CM | POA: Diagnosis not present

## 2022-02-03 DIAGNOSIS — Z79899 Other long term (current) drug therapy: Secondary | ICD-10-CM | POA: Diagnosis not present

## 2022-02-03 DIAGNOSIS — M0600 Rheumatoid arthritis without rheumatoid factor, unspecified site: Secondary | ICD-10-CM | POA: Diagnosis not present

## 2022-02-03 DIAGNOSIS — M2550 Pain in unspecified joint: Secondary | ICD-10-CM | POA: Diagnosis not present

## 2022-02-25 DIAGNOSIS — M25561 Pain in right knee: Secondary | ICD-10-CM | POA: Diagnosis not present

## 2022-02-25 DIAGNOSIS — M25531 Pain in right wrist: Secondary | ICD-10-CM | POA: Diagnosis not present

## 2022-02-25 DIAGNOSIS — Z7952 Long term (current) use of systemic steroids: Secondary | ICD-10-CM | POA: Diagnosis not present

## 2022-02-25 DIAGNOSIS — D649 Anemia, unspecified: Secondary | ICD-10-CM | POA: Diagnosis not present

## 2022-02-25 DIAGNOSIS — M0600 Rheumatoid arthritis without rheumatoid factor, unspecified site: Secondary | ICD-10-CM | POA: Diagnosis not present

## 2022-02-25 DIAGNOSIS — Z79631 Long term (current) use of antimetabolite agent: Secondary | ICD-10-CM | POA: Diagnosis not present

## 2022-03-01 DIAGNOSIS — N644 Mastodynia: Secondary | ICD-10-CM | POA: Diagnosis not present

## 2022-03-02 ENCOUNTER — Other Ambulatory Visit: Payer: Self-pay | Admitting: Obstetrics and Gynecology

## 2022-03-02 DIAGNOSIS — N644 Mastodynia: Secondary | ICD-10-CM

## 2022-03-09 ENCOUNTER — Encounter (HOSPITAL_BASED_OUTPATIENT_CLINIC_OR_DEPARTMENT_OTHER): Payer: Self-pay

## 2022-03-09 ENCOUNTER — Other Ambulatory Visit: Payer: Self-pay

## 2022-03-09 ENCOUNTER — Emergency Department (HOSPITAL_BASED_OUTPATIENT_CLINIC_OR_DEPARTMENT_OTHER)
Admission: EM | Admit: 2022-03-09 | Discharge: 2022-03-09 | Discharge status: Against Medical Advice | Payer: Medicare Other | Attending: Emergency Medicine | Admitting: Emergency Medicine

## 2022-03-09 DIAGNOSIS — R112 Nausea with vomiting, unspecified: Secondary | ICD-10-CM | POA: Diagnosis not present

## 2022-03-09 DIAGNOSIS — R519 Headache, unspecified: Secondary | ICD-10-CM | POA: Insufficient documentation

## 2022-03-09 DIAGNOSIS — Z5321 Procedure and treatment not carried out due to patient leaving prior to being seen by health care provider: Secondary | ICD-10-CM | POA: Insufficient documentation

## 2022-03-09 NOTE — ED Triage Notes (Signed)
Patient here POV from Home.  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 Soon after (20 Minutes ago) she began to have a Headache that resulted in N/V.   NAD Noted during Triage. A&Ox4. GCS 15. Ambulatory.

## 2022-03-09 NOTE — ED Notes (Signed)
Patient called several times for Examination Room. No response and not visualized in Waiting areas.

## 2022-03-10 DIAGNOSIS — R1310 Dysphagia, unspecified: Secondary | ICD-10-CM | POA: Diagnosis not present

## 2022-03-10 DIAGNOSIS — R0789 Other chest pain: Secondary | ICD-10-CM | POA: Diagnosis not present

## 2022-03-12 DIAGNOSIS — R079 Chest pain, unspecified: Secondary | ICD-10-CM | POA: Diagnosis not present

## 2022-03-18 DIAGNOSIS — E1169 Type 2 diabetes mellitus with other specified complication: Secondary | ICD-10-CM | POA: Diagnosis not present

## 2022-03-25 ENCOUNTER — Encounter (HOSPITAL_COMMUNITY): Payer: Self-pay | Admitting: Cardiology

## 2022-03-25 ENCOUNTER — Ambulatory Visit (HOSPITAL_BASED_OUTPATIENT_CLINIC_OR_DEPARTMENT_OTHER)
Admission: RE | Admit: 2022-03-25 | Discharge: 2022-03-25 | Disposition: A | Discharge status: Home / Self Care | Payer: Medicare Other | Source: Ambulatory Visit

## 2022-03-25 ENCOUNTER — Ambulatory Visit (HOSPITAL_COMMUNITY)
Admission: RE | Admit: 2022-03-25 | Discharge: 2022-03-25 | Disposition: A | Discharge status: Home / Self Care | Payer: Medicare Other | Source: Ambulatory Visit | Attending: Cardiology | Admitting: Cardiology

## 2022-03-25 VITALS — BP 102/68 | HR 83 | Wt 201.8 lb

## 2022-03-25 DIAGNOSIS — M069 Rheumatoid arthritis, unspecified: Secondary | ICD-10-CM | POA: Diagnosis not present

## 2022-03-25 DIAGNOSIS — Z7985 Long-term (current) use of injectable non-insulin antidiabetic drugs: Secondary | ICD-10-CM | POA: Insufficient documentation

## 2022-03-25 DIAGNOSIS — Z79899 Other long term (current) drug therapy: Secondary | ICD-10-CM | POA: Diagnosis not present

## 2022-03-25 DIAGNOSIS — I5022 Chronic systolic (congestive) heart failure: Secondary | ICD-10-CM | POA: Diagnosis not present

## 2022-03-25 DIAGNOSIS — Z91148 Patient's other noncompliance with medication regimen for other reason: Secondary | ICD-10-CM | POA: Diagnosis not present

## 2022-03-25 DIAGNOSIS — I428 Other cardiomyopathies: Secondary | ICD-10-CM | POA: Insufficient documentation

## 2022-03-25 DIAGNOSIS — I110 Hypertensive heart disease with heart failure: Secondary | ICD-10-CM | POA: Insufficient documentation

## 2022-03-25 DIAGNOSIS — E669 Obesity, unspecified: Secondary | ICD-10-CM | POA: Diagnosis not present

## 2022-03-25 LAB — BASIC METABOLIC PANEL
Anion gap: 10 (ref 5–15)
BUN: 12 mg/dL (ref 6–20)
CO2: 25 mmol/L (ref 22–32)
Calcium: 8.7 mg/dL — ABNORMAL LOW (ref 8.9–10.3)
Chloride: 102 mmol/L (ref 98–111)
Creatinine, Ser: 0.77 mg/dL (ref 0.44–1.00)
GFR, Estimated: >60 mL/min (ref >60)
Glucose, Bld: 80 mg/dL (ref 70–99)
Potassium: 3.8 mmol/L (ref 3.5–5.1)
Sodium: 137 mmol/L (ref 135–145)

## 2022-03-25 LAB — ECHOCARDIOGRAM COMPLETE
Area-P 1/2: 3.89 cm2
Calc EF: 32.8 %
MV VTI: 2.89 cm2
S' Lateral: 5.1 cm
Single Plane A2C EF: 37.7 %
Single Plane A4C EF: 30 %

## 2022-03-25 LAB — BRAIN NATRIURETIC PEPTIDE: B Natriuretic Peptide: 102.4 pg/mL — ABNORMAL HIGH (ref 0.0–100.0)

## 2022-03-25 MED ORDER — FUROSEMIDE 40 MG PO TABS
40.0000 mg | ORAL_TABLET | ORAL | 3 refills | Status: DC | PRN
Start: 2022-03-25 — End: 2022-10-08

## 2022-03-25 NOTE — Patient Instructions (Signed)
PLEASE TAKE ALL MEDICATIONS ORDERED.  CHANGE Lasix to as needed for weight gain of 3lb in 24 hours or 5 lb in a week   Labs done today, your results will be available in MyChart, we will contact you for abnormal readings.  Your physician recommends that you schedule a follow-up appointment in: 2 months  If you have any questions or concerns before your next appointment please send [place] a message through [place] or call our office at [phone].    TO LEAVE A MESSAGE FOR THE NURSE SELECT OPTION 2, PLEASE LEAVE A MESSAGE INCLUDING: YOUR NAME DATE OF BIRTH CALL BACK NUMBER REASON FOR CALL**this is important as we prioritize the call backs  YOU WILL RECEIVE A CALL BACK THE SAME DAY AS LONG AS YOU CALL BEFORE 4:00 PM  At the [facility], you and your health needs are our priority. As part of our continuing mission to provide you with exceptional heart care, we have created designated Provider Care Teams. These Care Teams include your primary Cardiologist (physician) and Advanced Practice Providers (APPs- Physician Assistants and Nurse Practitioners) who all work together to provide you with the care you need, when you need it.   You may see any of the following providers on your designated Care Team at your next follow up: Dr Arvilla Meres Dr Marca Ancona Dr. Marcos Eke, NP Robbie Lis, [place] [facility] [place], [place] Brynda Peon, NP Karle Plumber, PharmD   Please be sure to bring in all your medications bottles to every appointment.

## 2022-03-25 NOTE — Progress Notes (Signed)
  Echocardiogram 2D Echocardiogram has been performed.  Maren Reamer 03/25/2022, 2:27 PM

## 2022-03-28 NOTE — Progress Notes (Signed)
PCP: Dr. Justin Mend Cardiology: Dr. Angelena Form HF Cardiology: Dr. Aundra Dubin  51 y.o. with history of nonischemic cardiomyopathy/chronic systolic CHF and rheumatoid arthritis was referred by Dr. Angelena Form for evaluation/management of CHF.    Patient has had a cardiomyopathy known for a number of years now.  Coronary angiography in 2012 showed no significant disease.  EF has been in the 25-30% range long-term.  Most recent echo in 8/18 showed EF remains 25-30%.  She has no family history of cardiomyopathy and she has never been a heavy drinker.  She has not used illicit drugs.  [employer] in 10/18 showed preserved cardiac output with mildly increased filling pressures.  Lasix was increased to 120 qam/80 qpm.  She was seen by EP (Camnitz) and decided against ICD.    Echo in 10/19 showed EF 40-45% with diffuse hypokinesis, normal RV.   Echo in 2/21 showed EF 40-45%, diffuse hypokinesis.   Echo in 3/23 showed EF lower at 30-35%, mild-moderate LV dilation, normal RV.  Echo was done today and reviewed, EF 25-30%, global hypokinesis, LV mildly dilated, mildly decreased RV systolic function, IVC normal.   She returns for followup of CHF.  Weight is down 11 lbs, she is taking Mounjaro.  Still poorly compliant with her medications, admits to taking them only about 3 days a week.  Brother and mother died recently (both were on dialysis), she has been under a lot of stress. No dyspnea walking on flat ground or up a flight of stairs.  No chest pain.  No lightheadedness.  No orthopnea/PND.   ECG (personally reviewed): NSR, normal  Labs (9/18): K 4.4, creatinine 0.81 Labs (10/18): K 4, creatinine 0.88 Labs (1/19): K 3.9, creatinine 0.66 Labs (10/19): K 3.9, creatinine 0.67 Labs (12/19): creatinine 1.14 Labs (10/20): LDL 93, K 4.3, creatinine 1.15 Labs (4/21): K 2.7, creatinine 0.97 Labs (6/22): K 3.6, creatinine 0.67 Labs (9/22): K 3.5, creatinine 0.66 Labs (10/23): K 4, creatinine 0.87  PMH: 1. Rheumatoid arthritis:  Followed by Dr. Lenna Gilford.  2. Type II diabetes.  3. HTN 4. Chronic systolic CHF: Nonischemic cardiomyopathy.  - LHC (2012): No significant CAD.  - Echo (7/13): EF 30-35% - Cardiac MRI (12/14): Mild LV dilation, EF 40%, normal RV size and systolic function, no late gadolinium enhancement.  - Echo (7/16): EF 25-30% - Echo (8/18): EF 25-30% - RHC (10/18): mean RA 11, PA 47/20 mean 32, mean PCWP 16, CI 3.06 Fick/3.5 thermo, PVR 2.65 WU.  - Echo (10/19): EF 40-45%, diffuse hypokinesis, moderate diastolic dysfunction, normal RV size and systolic function  - Echo (2/21): EF 40-45%, diffuse hypokinesis.  - Echo (2/23): EF lower at 30-35%, mild-moderate LV dilation, normal RV.  - Echo (12/23): EF 25-30%, global hypokinesis, LV mildly dilated, mildly decreased RV systolic function, IVC normal.   SH: Lives alone.  Rare ETOH, no drugs.  No smoking.   Family History  Problem Relation Age of Onset   Heart attack Mother    CAD Mother    Allergies Mother    Breast cancer Mother    Hypertension Other    Diabetes Mellitus II Other    ROS: All systems reviewed and negative except as per HPI.   Current Outpatient Medications  Medication Sig Dispense Refill   albuterol (PROVENTIL) (2.5 MG/3ML) 0.083% nebulizer solution Take 2.5 mg by nebulization every 6 (six) hours as needed for wheezing or shortness of breath.     albuterol (VENTOLIN HFA) 108 (90 Base) MCG/ACT inhaler Inhale 2 puffs into the lungs every  6 (six) hours as needed for wheezing or shortness of breath.     benzonatate (TESSALON) 100 MG capsule Take 1 capsule (100 mg total) by mouth as needed for cough. 20 capsule 1   betamethasone dipropionate (DIPROLENE) 0.05 % ointment Apply BID every other day to scalp. Avoid face, groin, underarms     cholecalciferol (VITAMIN D3) 25 MCG (1000 UNIT) tablet Take 25 mcg by mouth daily.     cyclobenzaprine (FLEXERIL) 10 MG tablet Take 1 tablet (10 mg total) by mouth at bedtime as needed for muscle spasms. 8  tablet 0   dapagliflozin propanediol (FARXIGA) 10 MG TABS tablet Take 1 tablet (10 mg total) by mouth daily before breakfast. 90 tablet 3   ferrous gluconate (FERGON) 324 MG tablet Take 324 mg by mouth daily.     Fluocinolone Acetonide Scalp 0.01 % OIL      fluticasone (FLONASE) 50 MCG/ACT nasal spray Place 2 sprays into both nostrils daily as needed.     folic acid (FOLVITE) 1 MG tablet Take 1 mg by mouth daily.     gabapentin (NEURONTIN) 600 MG tablet Take 600 mg by mouth daily as needed.     isosorbide-hydrALAZINE (BIDIL) 20-37.5 MG tablet Take 2 tablets by mouth 3 (three) times daily. 540 tablet 3   leflunomide (ARAVA) 20 MG tablet Take 20 mg by mouth daily.     levocetirizine (XYZAL) 5 MG tablet Take 5 mg by mouth at bedtime.     meloxicam (MOBIC) 15 MG tablet Take 15 mg by mouth daily as needed.     methotrexate 2.5 MG tablet Take 10 mg by mouth once a week.     metoprolol succinate (TOPROL-XL) 100 MG 24 hr tablet Take 1 tablet (100 mg total) by mouth daily. 90 tablet 3   minoxidil (LONITEN) 2.5 MG tablet Take 1.25 mg by mouth daily.     montelukast (SINGULAIR) 10 MG tablet Take 10 mg by mouth every morning.     MOUNJARO 5 MG/0.5ML Pen Inject 5 mg into the skin once a week.     omeprazole (PRILOSEC) 40 MG capsule Take 1 capsule (40 mg total) by mouth daily before breakfast. 30 capsule 0   potassium chloride SA (KLOR-CON M) 20 MEQ tablet Take 1 tablet (20 mEq total) by mouth daily. 90 tablet 0   predniSONE (DELTASONE) 5 MG tablet Take 5 mg by mouth daily.     sacubitril-valsartan (ENTRESTO) 97-103 MG Take 1 tablet by mouth 2 (two) times daily. 180 tablet 3   spironolactone (ALDACTONE) 25 MG tablet Take 1 tablet (25 mg total) by mouth daily. 90 tablet 3   SYMBICORT 160-4.5 MCG/ACT inhaler Inhale 2 puffs into the lungs daily as needed (for respiratory issues.).      traMADol (ULTRAM) 50 MG tablet Take 50 mg by mouth every 4 (four) hours as needed for moderate pain or severe pain.      traZODone (DESYREL) 50 MG tablet Take 50 mg by mouth at bedtime.     valACYclovir (VALTREX) 500 MG tablet Take 500 mg by mouth 2 (two) times daily as needed (for cold sores.).     furosemide (LASIX) 40 MG tablet Take 1 tablet (40 mg total) by mouth as needed. 90 tablet 3   No current facility-administered medications for this encounter.   BP 102/68   Pulse 83   Wt 91.5 kg (201 lb 12.8 oz)   LMP 08/10/2019 (Exact Date)   SpO2 100%   BMI 35.75 kg/m  General:  NAD Neck: No JVD, no thyromegaly or thyroid nodule.  Lungs: Clear to auscultation bilaterally with normal respiratory effort. CV: Nondisplaced PMI.  Heart regular S1/S2, no S3/S4, no murmur.  No peripheral edema.  No carotid bruit.  Normal pedal pulses.  Abdomen: Soft, nontender, no hepatosplenomegaly, no distention.  Skin: Intact without lesions or rashes.  Neurologic: Alert and oriented x 3.  Psych: Normal affect. Extremities: No clubbing or cyanosis.  HEENT: Normal.   Assessment/Plan: 1. Chronic systolic CHF: Nonischemic cardiomyopathy, cath showed no significant coronary disease at time of diagnosis.  Etiology uncertain, possible prior viral myocarditis.  CMRI in 12/14 with no LGE noted.  No heavy drinking or drugs.  No history of familial cardiomyopathy.  Echo in 2/21 showed stable EF 40-45%.  Echo in 3/23 with EF lower at 30-35%, mild-moderate LV dilation, normal RV.  Echo today showed EF 25-30%, global hypokinesis, LV mildly dilated, mildly decreased RV systolic function, IVC normal. She still has not been compliant with medications, takes only about 3 days/week.  NYHA class II symptoms, not volume overloaded on exam.  - EF is < 35% but will not refer for ICD yet. Though she is young, she has NICM without significant scarring on cMRI.  We need to reassess her LV function when compliant with meds as she got her EF up to 40-45% in the past.  We had a long discussion about this and she seems willing to take meds as she is supposed to  take them.  - She has not been taking Lasix at all, will keep prn.  - Continue spironolactone 25 mg daily.  BMET/BNP today.  - Continue Toprol XL 100 mg bid.   - Continue Bidil 2 tabs tid.   - Continue Entresto 97/103 bid.    - Continue dapagliflozin 10 mg daily 2. Rheumatoid arthritis: She sees a rheumatologist.  3. Obesity: Losing weight with Mounjaro.   Followup with APP in 2 months.  If she is taking her meds regularly, will arrange for repeat echo after that appointment.    Marca Ancona 03/28/2022

## 2022-03-30 DIAGNOSIS — L650 Telogen effluvium: Secondary | ICD-10-CM | POA: Diagnosis not present

## 2022-03-30 DIAGNOSIS — L648 Other androgenic alopecia: Secondary | ICD-10-CM | POA: Diagnosis not present

## 2022-04-01 ENCOUNTER — Other Ambulatory Visit (HOSPITAL_COMMUNITY): Payer: Self-pay | Admitting: Cardiology

## 2022-04-14 ENCOUNTER — Other Ambulatory Visit: Payer: Medicare Other

## 2022-04-15 DIAGNOSIS — Z1833 Retained wood fragments: Secondary | ICD-10-CM | POA: Diagnosis not present

## 2022-04-27 DIAGNOSIS — U071 COVID-19: Secondary | ICD-10-CM | POA: Diagnosis not present

## 2022-04-27 DIAGNOSIS — R051 Acute cough: Secondary | ICD-10-CM | POA: Diagnosis not present

## 2022-05-25 NOTE — Progress Notes (Signed)
PCP: Dr. Justin Mend Cardiology: Dr. Angelena Form HF Cardiology: Dr. Aundra Dubin  52 y.o. with history of nonischemic cardiomyopathy/chronic systolic CHF and rheumatoid arthritis was referred by Dr. Angelena Form for evaluation/management of CHF.    Patient has had a cardiomyopathy known for a number of years now.  Coronary angiography in 2012 showed no significant disease.  EF has been in the 25-30% range long-term.  Most recent echo in 8/18 showed EF remains 25-30%.  She has no family history of cardiomyopathy and she has never been a heavy drinker.  She has not used illicit drugs.  [employer] in 10/18 showed preserved cardiac output with mildly increased filling pressures.  Lasix was increased to 120 qam/80 qpm.  She was seen by EP (Camnitz) and decided against ICD.    Echo in 10/19 showed EF 40-45% with diffuse hypokinesis, normal RV.   Echo in 2/21 showed EF 40-45%, diffuse hypokinesis.   Echo in 3/23 showed EF lower at 30-35%, mild-moderate LV dilation, normal RV.  Echo was done today and reviewed, EF 25-30%, global hypokinesis, LV mildly dilated, mildly decreased RV systolic function, IVC normal.   She returns for followup of CHF.  Weight is down 11 lbs, she is taking Mounjaro.  Still poorly compliant with her medications, admits to taking them only about 3 days a week.  Brother and mother died recently (both were on dialysis), she has been under a lot of stress. No dyspnea walking on flat ground or up a flight of stairs.  No chest pain.  No lightheadedness.  No orthopnea/PND.   ECG (personally reviewed): NSR, normal  Labs (9/18): K 4.4, creatinine 0.81 Labs (10/18): K 4, creatinine 0.88 Labs (1/19): K 3.9, creatinine 0.66 Labs (10/19): K 3.9, creatinine 0.67 Labs (12/19): creatinine 1.14 Labs (10/20): LDL 93, K 4.3, creatinine 1.15 Labs (4/21): K 2.7, creatinine 0.97 Labs (6/22): K 3.6, creatinine 0.67 Labs (9/22): K 3.5, creatinine 0.66 Labs (10/23): K 4, creatinine 0.87  PMH: 1. Rheumatoid arthritis:  Followed by Dr. Lenna Gilford.  2. Type II diabetes.  3. HTN 4. Chronic systolic CHF: Nonischemic cardiomyopathy.  - LHC (2012): No significant CAD.  - Echo (7/13): EF 30-35% - Cardiac MRI (12/14): Mild LV dilation, EF 40%, normal RV size and systolic function, no late gadolinium enhancement.  - Echo (7/16): EF 25-30% - Echo (8/18): EF 25-30% - RHC (10/18): mean RA 11, PA 47/20 mean 32, mean PCWP 16, CI 3.06 Fick/3.5 thermo, PVR 2.65 WU.  - Echo (10/19): EF 40-45%, diffuse hypokinesis, moderate diastolic dysfunction, normal RV size and systolic function  - Echo (2/21): EF 40-45%, diffuse hypokinesis.  - Echo (2/23): EF lower at 30-35%, mild-moderate LV dilation, normal RV.  - Echo (12/23): EF 25-30%, global hypokinesis, LV mildly dilated, mildly decreased RV systolic function, IVC normal.   SH: Lives alone.  Rare ETOH, no drugs.  No smoking.   Family History  Problem Relation Age of Onset   Heart attack Mother    CAD Mother    Allergies Mother    Breast cancer Mother    Hypertension Other    Diabetes Mellitus II Other    ROS: All systems reviewed and negative except as per HPI.   Current Outpatient Medications  Medication Sig Dispense Refill   albuterol (PROVENTIL) (2.5 MG/3ML) 0.083% nebulizer solution Take 2.5 mg by nebulization every 6 (six) hours as needed for wheezing or shortness of breath.     albuterol (VENTOLIN HFA) 108 (90 Base) MCG/ACT inhaler Inhale 2 puffs into the lungs every  6 (six) hours as needed for wheezing or shortness of breath.     benzonatate (TESSALON) 100 MG capsule Take 1 capsule (100 mg total) by mouth as needed for cough. 20 capsule 1   betamethasone dipropionate (DIPROLENE) 0.05 % ointment Apply BID every other day to scalp. Avoid face, groin, underarms     cholecalciferol (VITAMIN D3) 25 MCG (1000 UNIT) tablet Take 25 mcg by mouth daily.     cyclobenzaprine (FLEXERIL) 10 MG tablet Take 1 tablet (10 mg total) by mouth at bedtime as needed for muscle spasms. 8  tablet 0   dapagliflozin propanediol (FARXIGA) 10 MG TABS tablet Take 1 tablet (10 mg total) by mouth daily before breakfast. 90 tablet 3   ferrous gluconate (FERGON) 324 MG tablet Take 324 mg by mouth daily.     Fluocinolone Acetonide Scalp 0.01 % OIL      fluticasone (FLONASE) 50 MCG/ACT nasal spray Place 2 sprays into both nostrils daily as needed.     folic acid (FOLVITE) 1 MG tablet Take 1 mg by mouth daily.     furosemide (LASIX) 40 MG tablet Take 1 tablet (40 mg total) by mouth as needed. 90 tablet 3   gabapentin (NEURONTIN) 600 MG tablet Take 600 mg by mouth daily as needed.     isosorbide-hydrALAZINE (BIDIL) 20-37.5 MG tablet Take 2 tablets by mouth 3 (three) times daily. 540 tablet 3   leflunomide (ARAVA) 20 MG tablet Take 20 mg by mouth daily.     levocetirizine (XYZAL) 5 MG tablet Take 5 mg by mouth at bedtime.     meloxicam (MOBIC) 15 MG tablet Take 15 mg by mouth daily as needed.     methotrexate 2.5 MG tablet Take 10 mg by mouth once a week.     metoprolol succinate (TOPROL-XL) 100 MG 24 hr tablet Take 1 tablet (100 mg total) by mouth daily. 90 tablet 3   minoxidil (LONITEN) 2.5 MG tablet Take 1.25 mg by mouth daily.     montelukast (SINGULAIR) 10 MG tablet Take 10 mg by mouth every morning.     MOUNJARO 5 MG/0.5ML Pen Inject 5 mg into the skin once a week.     omeprazole (PRILOSEC) 40 MG capsule Take 1 capsule (40 mg total) by mouth daily before breakfast. 30 capsule 0   potassium chloride SA (KLOR-CON M) 20 MEQ tablet Take 1 tablet (20 mEq total) by mouth daily. 90 tablet 0   predniSONE (DELTASONE) 5 MG tablet Take 5 mg by mouth daily.     sacubitril-valsartan (ENTRESTO) 97-103 MG Take 1 tablet by mouth 2 (two) times daily. 180 tablet 3   spironolactone (ALDACTONE) 25 MG tablet Take 1 tablet (25 mg total) by mouth daily. 90 tablet 3   SYMBICORT 160-4.5 MCG/ACT inhaler Inhale 2 puffs into the lungs daily as needed (for respiratory issues.).      traMADol (ULTRAM) 50 MG tablet  Take 50 mg by mouth every 4 (four) hours as needed for moderate pain or severe pain.     traZODone (DESYREL) 50 MG tablet Take 50 mg by mouth at bedtime.     valACYclovir (VALTREX) 500 MG tablet Take 500 mg by mouth 2 (two) times daily as needed (for cold sores.).     No current facility-administered medications for this visit.   LMP 08/10/2019 (Exact Date)  General: NAD Neck: No JVD, no thyromegaly or thyroid nodule.  Lungs: Clear to auscultation bilaterally with normal respiratory effort. CV: Nondisplaced PMI.  Heart regular S1/S2,  no S3/S4, no murmur.  No peripheral edema.  No carotid bruit.  Normal pedal pulses.  Abdomen: Soft, nontender, no hepatosplenomegaly, no distention.  Skin: Intact without lesions or rashes.  Neurologic: Alert and oriented x 3.  Psych: Normal affect. Extremities: No clubbing or cyanosis.  HEENT: Normal.   Assessment/Plan: 1. Chronic systolic CHF: Nonischemic cardiomyopathy, cath showed no significant coronary disease at time of diagnosis.  Etiology uncertain, possible prior viral myocarditis.  CMRI in 12/14 with no LGE noted.  No heavy drinking or drugs.  No history of familial cardiomyopathy.  Echo in 2/21 showed stable EF 40-45%.  Echo in 3/23 with EF lower at 30-35%, mild-moderate LV dilation, normal RV.  Echo today showed EF 25-30%, global hypokinesis, LV mildly dilated, mildly decreased RV systolic function, IVC normal. She still has not been compliant with medications, takes only about 3 days/week.  NYHA class II symptoms, not volume overloaded on exam.  - EF is < 35% but will not refer for ICD yet. Though she is young, she has NICM without significant scarring on cMRI.  We need to reassess her LV function when compliant with meds as she got her EF up to 40-45% in the past.  We had a long discussion about this and she seems willing to take meds as she is supposed to take them.  - She has not been taking Lasix at all, will keep prn.  - Continue spironolactone  25 mg daily.  BMET/BNP today.  - Continue Toprol XL 100 mg bid.   - Continue Bidil 2 tabs tid.   - Continue Entresto 97/103 bid.    - Continue dapagliflozin 10 mg daily 2. Rheumatoid arthritis: She sees a rheumatologist.  3. Obesity: Losing weight with Mounjaro.   Followup with APP in 2 months.  If she is taking her meds regularly, will arrange for repeat echo after that appointment.    [place] 05/25/2022

## 2022-05-26 ENCOUNTER — Encounter (HOSPITAL_COMMUNITY): Payer: Self-pay

## 2022-05-26 ENCOUNTER — Ambulatory Visit (HOSPITAL_COMMUNITY)
Admission: RE | Admit: 2022-05-26 | Discharge: 2022-05-26 | Disposition: A | Discharge status: Home / Self Care | Payer: 59 | Source: Ambulatory Visit | Attending: Family Medicine | Admitting: Family Medicine

## 2022-05-26 VITALS — BP 106/70 | HR 84 | Ht 62.0 in | Wt 197.6 lb

## 2022-05-26 DIAGNOSIS — Z7969 Long term (current) use of other immunomodulators and immunosuppressants: Secondary | ICD-10-CM | POA: Insufficient documentation

## 2022-05-26 DIAGNOSIS — I428 Other cardiomyopathies: Secondary | ICD-10-CM | POA: Diagnosis not present

## 2022-05-26 DIAGNOSIS — I110 Hypertensive heart disease with heart failure: Secondary | ICD-10-CM | POA: Insufficient documentation

## 2022-05-26 DIAGNOSIS — Z7984 Long term (current) use of oral hypoglycemic drugs: Secondary | ICD-10-CM | POA: Insufficient documentation

## 2022-05-26 DIAGNOSIS — I5022 Chronic systolic (congestive) heart failure: Secondary | ICD-10-CM | POA: Diagnosis not present

## 2022-05-26 DIAGNOSIS — Z7951 Long term (current) use of inhaled steroids: Secondary | ICD-10-CM | POA: Diagnosis not present

## 2022-05-26 DIAGNOSIS — E669 Obesity, unspecified: Secondary | ICD-10-CM | POA: Insufficient documentation

## 2022-05-26 DIAGNOSIS — Z6836 Body mass index (BMI) 36.0-36.9, adult: Secondary | ICD-10-CM | POA: Insufficient documentation

## 2022-05-26 DIAGNOSIS — M069 Rheumatoid arthritis, unspecified: Secondary | ICD-10-CM | POA: Insufficient documentation

## 2022-05-26 DIAGNOSIS — Z79899 Other long term (current) drug therapy: Secondary | ICD-10-CM | POA: Insufficient documentation

## 2022-05-26 DIAGNOSIS — E119 Type 2 diabetes mellitus without complications: Secondary | ICD-10-CM | POA: Diagnosis not present

## 2022-05-26 LAB — BASIC METABOLIC PANEL
Anion gap: 13 (ref 5–15)
BUN: 24 mg/dL — ABNORMAL HIGH (ref 6–20)
CO2: 23 mmol/L (ref 22–32)
Calcium: 9.2 mg/dL (ref 8.9–10.3)
Chloride: 101 mmol/L (ref 98–111)
Creatinine, Ser: 0.77 mg/dL (ref 0.44–1.00)
GFR, Estimated: >60 mL/min (ref >60)
Glucose, Bld: 93 mg/dL (ref 70–99)
Potassium: 4.2 mmol/L (ref 3.5–5.1)
Sodium: 137 mmol/L (ref 135–145)

## 2022-05-26 LAB — BRAIN NATRIURETIC PEPTIDE: B Natriuretic Peptide: 70.1 pg/mL (ref 0.0–100.0)

## 2022-05-26 NOTE — Patient Instructions (Signed)
Thank you for coming in today  Labs were done today, if any labs are abnormal the clinic will call you No news is good news  Your physician has requested that you have an echocardiogram. Echocardiography is a painless test that uses sound waves to create images of your heart. It provides your doctor with information about the size and shape of your heart and how well your heart's chambers and valves are working. This procedure takes approximately one hour. There are no restrictions for this procedure.   Your physician recommends that you schedule a follow-up appointment in: 3 months Dr. Kendall Flack will receive a reminder letter in the mail a few months in advance. If you don't receive a letter, please call our office to schedule the follow-up appointment.    Do the following things EVERYDAY: Weigh yourself in the morning before breakfast. Write it down and keep it in a log. Take your medicines as prescribed Eat low salt foods--Limit salt (sodium) to 2000 mg per day.  Stay as active as you can everyday Limit all fluids for the day to less than 2 liters  At the [facility], you and your health needs are our priority. As part of our continuing mission to provide you with exceptional heart care, we have created designated Provider Care Teams. These Care Teams include your primary Cardiologist (physician) and Advanced Practice Providers (APPs- Physician Assistants and Nurse Practitioners) who all work together to provide you with the care you need, when you need it.   You may see any of the following providers on your designated Care Team at your next follow up: Dr Glori Bickers Dr Loralie Champagne Dr. Roxana Hires, NP Lyda Jester, [place] [facility] [place], [place] Forestine Na, NP Audry Riles, PharmD   Please be sure to bring in all your medications bottles to every appointment.    Thank you for choosing [facility]   If you have any questions or concerns before your next appointment please send [place] a message through [place] or call our office at [phone].    TO LEAVE A MESSAGE FOR THE NURSE SELECT OPTION 2, PLEASE LEAVE A MESSAGE INCLUDING: YOUR NAME DATE OF BIRTH CALL BACK NUMBER REASON FOR CALL**this is important as we prioritize the call backs  YOU WILL RECEIVE A CALL BACK THE SAME DAY AS LONG AS YOU CALL BEFORE 4:00 PM

## 2022-06-04 DIAGNOSIS — M0600 Rheumatoid arthritis without rheumatoid factor, unspecified site: Secondary | ICD-10-CM | POA: Diagnosis not present

## 2022-06-04 DIAGNOSIS — Z79899 Other long term (current) drug therapy: Secondary | ICD-10-CM | POA: Diagnosis not present

## 2022-06-04 DIAGNOSIS — M2550 Pain in unspecified joint: Secondary | ICD-10-CM | POA: Diagnosis not present

## 2022-06-23 ENCOUNTER — Other Ambulatory Visit (HOSPITAL_COMMUNITY): Payer: Self-pay

## 2022-06-23 DIAGNOSIS — I5022 Chronic systolic (congestive) heart failure: Secondary | ICD-10-CM

## 2022-06-24 ENCOUNTER — Ambulatory Visit (HOSPITAL_COMMUNITY): Payer: 59

## 2022-07-02 ENCOUNTER — Ambulatory Visit (HOSPITAL_COMMUNITY): Admission: RE | Admit: 2022-07-02 | Payer: 59 | Source: Ambulatory Visit

## 2022-07-16 DIAGNOSIS — M25561 Pain in right knee: Secondary | ICD-10-CM | POA: Diagnosis not present

## 2022-07-16 DIAGNOSIS — Z79899 Other long term (current) drug therapy: Secondary | ICD-10-CM | POA: Diagnosis not present

## 2022-07-16 DIAGNOSIS — Z79631 Long term (current) use of antimetabolite agent: Secondary | ICD-10-CM | POA: Diagnosis not present

## 2022-07-16 DIAGNOSIS — Z79620 Long term (current) use of immunosuppressive biologic: Secondary | ICD-10-CM | POA: Diagnosis not present

## 2022-07-16 DIAGNOSIS — M25512 Pain in left shoulder: Secondary | ICD-10-CM | POA: Diagnosis not present

## 2022-07-16 DIAGNOSIS — M0600 Rheumatoid arthritis without rheumatoid factor, unspecified site: Secondary | ICD-10-CM | POA: Diagnosis not present

## 2022-07-16 DIAGNOSIS — Z7952 Long term (current) use of systemic steroids: Secondary | ICD-10-CM | POA: Diagnosis not present

## 2022-07-23 ENCOUNTER — Ambulatory Visit (HOSPITAL_COMMUNITY): Payer: 59

## 2022-08-18 ENCOUNTER — Ambulatory Visit (HOSPITAL_COMMUNITY)
Admission: RE | Admit: 2022-08-18 | Discharge: 2022-08-18 | Disposition: A | Discharge status: Home / Self Care | Payer: 59 | Source: Ambulatory Visit | Attending: Cardiology | Admitting: Cardiology

## 2022-08-18 DIAGNOSIS — E119 Type 2 diabetes mellitus without complications: Secondary | ICD-10-CM | POA: Diagnosis not present

## 2022-08-18 DIAGNOSIS — I358 Other nonrheumatic aortic valve disorders: Secondary | ICD-10-CM | POA: Insufficient documentation

## 2022-08-18 DIAGNOSIS — I110 Hypertensive heart disease with heart failure: Secondary | ICD-10-CM | POA: Diagnosis not present

## 2022-08-18 DIAGNOSIS — I340 Nonrheumatic mitral (valve) insufficiency: Secondary | ICD-10-CM | POA: Diagnosis not present

## 2022-08-18 DIAGNOSIS — I5022 Chronic systolic (congestive) heart failure: Secondary | ICD-10-CM

## 2022-08-18 LAB — ECHOCARDIOGRAM COMPLETE
AR max vel: 2.45 cm2
AV Area VTI: 2.51 cm2
AV Area mean vel: 2.35 cm2
AV Mean grad: 4 mmHg
AV Peak grad: 6.3 mmHg
Ao pk vel: 1.26 m/s
Area-P 1/2: 3.55 cm2
Calc EF: 31.9 %
S' Lateral: 5.3 cm
Single Plane A2C EF: 29.1 %
Single Plane A4C EF: 28 %

## 2022-08-30 DIAGNOSIS — Z1231 Encounter for screening mammogram for malignant neoplasm of breast: Secondary | ICD-10-CM | POA: Diagnosis not present

## 2022-09-02 DIAGNOSIS — R922 Inconclusive mammogram: Secondary | ICD-10-CM | POA: Diagnosis not present

## 2022-09-02 DIAGNOSIS — R921 Mammographic calcification found on diagnostic imaging of breast: Secondary | ICD-10-CM | POA: Diagnosis not present

## 2022-09-13 DIAGNOSIS — E1140 Type 2 diabetes mellitus with diabetic neuropathy, unspecified: Secondary | ICD-10-CM | POA: Diagnosis not present

## 2022-09-13 DIAGNOSIS — Z0000 Encounter for general adult medical examination without abnormal findings: Secondary | ICD-10-CM | POA: Diagnosis not present

## 2022-09-13 DIAGNOSIS — J45909 Unspecified asthma, uncomplicated: Secondary | ICD-10-CM | POA: Diagnosis not present

## 2022-09-13 DIAGNOSIS — G479 Sleep disorder, unspecified: Secondary | ICD-10-CM | POA: Diagnosis not present

## 2022-09-13 DIAGNOSIS — E785 Hyperlipidemia, unspecified: Secondary | ICD-10-CM | POA: Diagnosis not present

## 2022-09-13 DIAGNOSIS — K30 Functional dyspepsia: Secondary | ICD-10-CM | POA: Diagnosis not present

## 2022-09-13 DIAGNOSIS — E1169 Type 2 diabetes mellitus with other specified complication: Secondary | ICD-10-CM | POA: Diagnosis not present

## 2022-09-13 DIAGNOSIS — I110 Hypertensive heart disease with heart failure: Secondary | ICD-10-CM | POA: Diagnosis not present

## 2022-09-13 DIAGNOSIS — E1151 Type 2 diabetes mellitus with diabetic peripheral angiopathy without gangrene: Secondary | ICD-10-CM | POA: Diagnosis not present

## 2022-09-13 DIAGNOSIS — M0609 Rheumatoid arthritis without rheumatoid factor, multiple sites: Secondary | ICD-10-CM | POA: Diagnosis not present

## 2022-09-13 DIAGNOSIS — I5022 Chronic systolic (congestive) heart failure: Secondary | ICD-10-CM | POA: Diagnosis not present

## 2022-09-29 DIAGNOSIS — L80 Vitiligo: Secondary | ICD-10-CM | POA: Diagnosis not present

## 2022-09-29 DIAGNOSIS — L650 Telogen effluvium: Secondary | ICD-10-CM | POA: Diagnosis not present

## 2022-09-29 DIAGNOSIS — L648 Other androgenic alopecia: Secondary | ICD-10-CM | POA: Diagnosis not present

## 2022-10-07 DIAGNOSIS — M25532 Pain in left wrist: Secondary | ICD-10-CM | POA: Diagnosis not present

## 2022-10-07 DIAGNOSIS — Z79631 Long term (current) use of antimetabolite agent: Secondary | ICD-10-CM | POA: Diagnosis not present

## 2022-10-07 DIAGNOSIS — M0600 Rheumatoid arthritis without rheumatoid factor, unspecified site: Secondary | ICD-10-CM | POA: Diagnosis not present

## 2022-10-07 DIAGNOSIS — M25561 Pain in right knee: Secondary | ICD-10-CM | POA: Diagnosis not present

## 2022-10-07 DIAGNOSIS — M25562 Pain in left knee: Secondary | ICD-10-CM | POA: Diagnosis not present

## 2022-10-07 DIAGNOSIS — M25531 Pain in right wrist: Secondary | ICD-10-CM | POA: Diagnosis not present

## 2022-10-08 ENCOUNTER — Other Ambulatory Visit (HOSPITAL_COMMUNITY): Payer: Self-pay | Admitting: Cardiology

## 2022-10-08 DIAGNOSIS — L648 Other androgenic alopecia: Secondary | ICD-10-CM | POA: Diagnosis not present

## 2022-10-13 ENCOUNTER — Telehealth (HOSPITAL_COMMUNITY): Payer: Self-pay

## 2022-10-13 NOTE — Telephone Encounter (Signed)
Patient called wanting your advice on if it is safe her to get pregnant and what would you advise if so

## 2022-10-13 NOTE — Telephone Encounter (Signed)
Patient called and informed of Dr. Alford Highland advice. She has multiple other questions and advised her to ask Dr. Shirlee Latch those questions at her follow up appointment on 11/01/22

## 2022-10-13 NOTE — Telephone Encounter (Signed)
Age + persistently very low EF would make pregnancy very risky.  I would not recommend.  If she were to become pregnant, would need to be monitored in center with high risk OB.

## 2022-11-01 ENCOUNTER — Ambulatory Visit (HOSPITAL_COMMUNITY)
Admission: RE | Admit: 2022-11-01 | Discharge: 2022-11-01 | Disposition: A | Discharge status: Home / Self Care | Payer: 59 | Source: Ambulatory Visit | Attending: Cardiology | Admitting: Cardiology

## 2022-11-01 ENCOUNTER — Encounter (HOSPITAL_COMMUNITY): Payer: Self-pay | Admitting: Cardiology

## 2022-11-01 VITALS — BP 110/70 | HR 88 | Wt 211.4 lb

## 2022-11-01 DIAGNOSIS — M069 Rheumatoid arthritis, unspecified: Secondary | ICD-10-CM | POA: Insufficient documentation

## 2022-11-01 DIAGNOSIS — I110 Hypertensive heart disease with heart failure: Secondary | ICD-10-CM | POA: Diagnosis not present

## 2022-11-01 DIAGNOSIS — I5022 Chronic systolic (congestive) heart failure: Secondary | ICD-10-CM | POA: Insufficient documentation

## 2022-11-01 DIAGNOSIS — Z6838 Body mass index (BMI) 38.0-38.9, adult: Secondary | ICD-10-CM | POA: Diagnosis not present

## 2022-11-01 DIAGNOSIS — I428 Other cardiomyopathies: Secondary | ICD-10-CM | POA: Insufficient documentation

## 2022-11-01 DIAGNOSIS — E669 Obesity, unspecified: Secondary | ICD-10-CM | POA: Insufficient documentation

## 2022-11-01 DIAGNOSIS — Z79899 Other long term (current) drug therapy: Secondary | ICD-10-CM | POA: Insufficient documentation

## 2022-11-01 LAB — BASIC METABOLIC PANEL
Anion gap: 8 (ref 5–15)
BUN: 16 mg/dL (ref 6–20)
CO2: 27 mmol/L (ref 22–32)
Calcium: 9.3 mg/dL (ref 8.9–10.3)
Chloride: 103 mmol/L (ref 98–111)
Creatinine, Ser: 0.79 mg/dL (ref 0.44–1.00)
GFR, Estimated: >60 mL/min (ref >60)
Glucose, Bld: 132 mg/dL — ABNORMAL HIGH (ref 70–99)
Potassium: 4 mmol/L (ref 3.5–5.1)
Sodium: 138 mmol/L (ref 135–145)

## 2022-11-01 LAB — BRAIN NATRIURETIC PEPTIDE: B Natriuretic Peptide: 58.2 pg/mL (ref 0.0–100.0)

## 2022-11-01 NOTE — Patient Instructions (Signed)
Medication Changes:  No Changes In Medications at this time.   Lab Work:  Labs done today, your results will be available in MyChart, we will contact you for abnormal readings.   Testing/Procedures:  Your physician has requested that you have an echocardiogram in 6 months. Echocardiography is a painless test that uses sound waves to create images of your heart. It provides your doctor with information about the size and shape of your heart and how well your heart's chambers and valves are working. You may receive an ultrasound enhancing agent through an IV if needed to better visualize your heart during the echo.This procedure takes approximately one hour. There are no restrictions for this procedure.  Follow-Up in: 3 MONTHS WITH APP AS SCHEDULED   THEN 6 MONTHS WITH DR. Shirlee Latch PLEASE CALL OUR OFFICE AROUND NOVEMBER TO GET SCHEDULED FOR YOUR APPOINTMENT. PHONE NUMBER IS [phone] OPTION 2    At the [facility], you and your health needs are our priority. We have a designated team specialized in the treatment of Heart Failure. This Care Team includes your primary Heart Failure Specialized Cardiologist (physician), Advanced Practice Providers (APPs- Physician Assistants and Nurse Practitioners), and Pharmacist who all work together to provide you with the care you need, when you need it.   You may see any of the following providers on your designated Care Team at your next follow up:  Dr. Arvilla Meres Dr. Marca Ancona Dr. Marcos Eke, NP Robbie Lis, [place] [facility] [place], [place] Brynda Peon, NP Karle Plumber, PharmD   Please be sure to bring in all your medications bottles to every appointment.   Need to Contact us:  If you have any questions or concerns before your next appointment please send [place] a message through [place] or call our office at [phone].    TO LEAVE A MESSAGE FOR THE NURSE SELECT OPTION 2, PLEASE LEAVE A  MESSAGE INCLUDING: YOUR NAME DATE OF BIRTH CALL BACK NUMBER REASON FOR CALL**this is important as we prioritize the call backs  YOU WILL RECEIVE A CALL BACK THE SAME DAY AS LONG AS YOU CALL BEFORE 4:00 PM

## 2022-11-01 NOTE — Progress Notes (Signed)
PCP: Dr. Hyman Hopes Cardiology: Dr. Clifton James HF Cardiology: Dr. Shirlee Latch  52 y.o. with history of nonischemic cardiomyopathy/chronic systolic CHF and rheumatoid arthritis was referred by Dr. Clifton James for evaluation/management of CHF.    Patient has had a cardiomyopathy known for a number of years now.  Coronary angiography in 2012 showed no significant disease.  EF has been in the 25-30% range long-term.  Most recent echo in 8/18 showed EF remains 25-30%.  She has no family history of cardiomyopathy and she has never been a heavy drinker.  She has not used illicit drugs.  RHC in 10/18 showed preserved cardiac output with mildly increased filling pressures.  Lasix was increased to 120 qam/80 qpm.  She was seen by EP (Camnitz) and decided against ICD.    Echo in 10/19 showed EF 40-45% with diffuse hypokinesis, normal RV.   Echo in 2/21 showed EF 40-45%, diffuse hypokinesis.   Echo in 3/23 showed EF lower at 30-35%, mild-moderate LV dilation, normal RV.  Echo in 12/23 showed EF 25-30%, global hypokinesis, LV mildly dilated, mildly decreased RV systolic function, IVC normal. Echo in 5/24 showed EF 30-35%, normal RV, normal IVC.    Today she returns for HF follow up. Weight up about 14 lbs.  She had been off Mounjaro but has recently restarted it and says that weight has been coming down again.  No significant exertional dyspnea or chest pain.  She is able to walk in the mall with no problems.  She is now taking Enbrel for rheumatoid arthritis and says that her joints have been feeling better.  No orthopnea/PND.  No palpitations or lightheadedness.   ECG (personally reviewed): NSR, LVH with repolarization abnormality  Labs (9/18): K 4.4, creatinine 0.81 Labs (10/18): K 4, creatinine 0.88 Labs (1/19): K 3.9, creatinine 0.66 Labs (10/19): K 3.9, creatinine 0.67 Labs (12/19): creatinine 1.14 Labs (10/20): LDL 93, K 4.3, creatinine 1.61 Labs (4/21): K 2.7, creatinine 0.97 Labs (6/22): K 3.6, creatinine  0.67 Labs (9/22): K 3.5, creatinine 0.66 Labs (10/23): K 4, creatinine 0.87 Labs (12/23): K 3.8, creatinine 0.77 Labs (2/24): K 4.3, creatinine 0.7, LFTs normal, BNP 70  PMH: 1. Rheumatoid arthritis: Followed by Dr. Lendon Colonel.  2. Type II diabetes.  3. HTN 4. Chronic systolic CHF: Nonischemic cardiomyopathy.  - LHC (2012): No significant CAD.  - Echo (7/13): EF 30-35% - Cardiac MRI (12/14): Mild LV dilation, EF 40%, normal RV size and systolic function, no late gadolinium enhancement.  - Echo (7/16): EF 25-30% - Echo (8/18): EF 25-30% - RHC (10/18): mean RA 11, PA 47/20 mean 32, mean PCWP 16, CI 3.06 Fick/3.5 thermo, PVR 2.65 WU.  - Echo (10/19): EF 40-45%, diffuse hypokinesis, moderate diastolic dysfunction, normal RV size and systolic function  - Echo (2/21): EF 40-45%, diffuse hypokinesis.  - Echo (2/23): EF lower at 30-35%, mild-moderate LV dilation, normal RV.  - Echo (12/23): EF 25-30%, global hypokinesis, LV mildly dilated, mildly decreased RV systolic function, IVC normal.  - Echo (5/24): EF 30-35%, normal RV, normal IVC.  SH: Lives alone.  Rare ETOH, no drugs.  No smoking.   Family History  Problem Relation Age of Onset   Heart attack Mother    CAD Mother    Allergies Mother    Breast cancer Mother    Hypertension Other    Diabetes Mellitus II Other    ROS: All systems reviewed and negative except as per HPI.   Current Outpatient Medications  Medication Sig Dispense Refill   albuterol (  PROVENTIL) (2.5 MG/3ML) 0.083% nebulizer solution Take 2.5 mg by nebulization every 6 (six) hours as needed for wheezing or shortness of breath.     albuterol (VENTOLIN HFA) 108 (90 Base) MCG/ACT inhaler Inhale 2 puffs into the lungs every 6 (six) hours as needed for wheezing or shortness of breath.     benzonatate (TESSALON) 100 MG capsule Take 1 capsule (100 mg total) by mouth as needed for cough. 20 capsule 1   betamethasone dipropionate (DIPROLENE) 0.05 % ointment Apply BID every other  day to scalp. Avoid face, groin, underarms     cholecalciferol (VITAMIN D3) 25 MCG (1000 UNIT) tablet Take 25 mcg by mouth daily.     cyclobenzaprine (FLEXERIL) 10 MG tablet Take 1 tablet (10 mg total) by mouth at bedtime as needed for muscle spasms. 8 tablet 0   dapagliflozin propanediol (FARXIGA) 10 MG TABS tablet Take 1 tablet (10 mg total) by mouth daily before breakfast. 90 tablet 3   ferrous gluconate (FERGON) 324 MG tablet Take 324 mg by mouth daily.     Fluocinolone Acetonide Scalp 0.01 % OIL      fluticasone (FLONASE) 50 MCG/ACT nasal spray Place 2 sprays into both nostrils daily as needed.     folic acid (FOLVITE) 1 MG tablet Take 1 mg by mouth daily.     furosemide (LASIX) 40 MG tablet Take 1 tablet by mouth once daily 90 tablet 0   gabapentin (NEURONTIN) 600 MG tablet Take 600 mg by mouth daily as needed.     isosorbide-hydrALAZINE (BIDIL) 20-37.5 MG tablet Take 2 tablets by mouth 3 (three) times daily. 540 tablet 3   leflunomide (ARAVA) 20 MG tablet Take 20 mg by mouth daily.     levocetirizine (XYZAL) 5 MG tablet Take 5 mg by mouth at bedtime.     meloxicam (MOBIC) 15 MG tablet Take 15 mg by mouth daily as needed.     methotrexate 2.5 MG tablet Take 10 mg by mouth once a week.     metoprolol succinate (TOPROL-XL) 100 MG 24 hr tablet Take 100 mg by mouth in the morning and at bedtime. Take with or immediately following a meal.     minoxidil (LONITEN) 2.5 MG tablet Take 2.5 mg by mouth daily.     montelukast (SINGULAIR) 10 MG tablet Take 10 mg by mouth every morning.     MOUNJARO 5 MG/0.5ML Pen Inject 5 mg into the skin once a week.     omeprazole (PRILOSEC) 40 MG capsule Take 1 capsule (40 mg total) by mouth daily before breakfast. 30 capsule 0   potassium chloride SA (KLOR-CON M) 20 MEQ tablet Take 1 tablet (20 mEq total) by mouth daily. 90 tablet 0   sacubitril-valsartan (ENTRESTO) 97-103 MG Take 1 tablet by mouth 2 (two) times daily. 180 tablet 3   spironolactone (ALDACTONE) 25  MG tablet Take 1 tablet (25 mg total) by mouth daily. 90 tablet 3   SYMBICORT 160-4.5 MCG/ACT inhaler Inhale 2 puffs into the lungs daily as needed (for respiratory issues.).      traMADol (ULTRAM) 50 MG tablet Take 50 mg by mouth every 4 (four) hours as needed for moderate pain or severe pain.     traZODone (DESYREL) 50 MG tablet Take 50 mg by mouth at bedtime.     valACYclovir (VALTREX) 500 MG tablet Take 500 mg by mouth 2 (two) times daily as needed (for cold sores.).     No current facility-administered medications for this encounter.  Wt Readings from Last 3 Encounters:  11/01/22 95.9 kg (211 lb 6.4 oz)  05/26/22 89.6 kg (197 lb 9.6 oz)  03/25/22 91.5 kg (201 lb 12.8 oz)    BP 110/70   Pulse 88   Wt 95.9 kg (211 lb 6.4 oz)   LMP 08/10/2019 (Exact Date)   SpO2 99%   BMI 38.67 kg/m  General: NAD Neck: No JVD, no thyromegaly or thyroid nodule.  Lungs: Clear to auscultation bilaterally with normal respiratory effort. CV: Nondisplaced PMI.  Heart regular S1/S2, no S3/S4, no murmur.  No peripheral edema.  No carotid bruit.  Normal pedal pulses.  Abdomen: Soft, nontender, no hepatosplenomegaly, no distention.  Skin: Intact without lesions or rashes.  Neurologic: Alert and oriented x 3.  Psych: Normal affect. Extremities: No clubbing or cyanosis.  HEENT: Normal.   Assessment/Plan: 1. Chronic systolic CHF: Nonischemic cardiomyopathy, cath showed no significant coronary disease at time of diagnosis.  Etiology uncertain, possible prior viral myocarditis.  CMRI in 12/14 with no LGE noted.  No heavy drinking or drugs.  No history of familial cardiomyopathy.  Echo in 2/21 showed stable EF 40-45%.  Echo in 3/23 with EF lower at 30-35%, mild-moderate LV dilation, normal RV.  Echo in 12/23 showed EF 25-30%, global hypokinesis, LV mildly dilated, mildly decreased RV systolic function, IVC normal. Echo today showed EF mildly higher, back to 30-35%.  NYHA class II symptoms, not volume overloaded.    - EF is < 35% but will not refer for ICD yet. Though she is young, she has NICM without significant scarring on cMRI.  She wants to continue meds x 6 months longer and repeat echo.  If EF < 35% at that point, she is willing to get ICD.  Not CRT candidate with narrow QRS.  - Continue Lasix PRN. - Continue spironolactone 25 mg daily.  BMET/BNP today.  - Continue Toprol XL 100 mg bid.   - Continue Bidil 2 tabs tid.   - Continue Entresto 97/103 bid.    - Continue dapagliflozin 10 mg daily.  2. Rheumatoid arthritis: She sees a rheumatologist, now on Enbrel.  3. Obesity: Body mass index is 38.67 kg/m. - Back on Mounjaro and starting to lose weight again.   Followup in 3 months with APP, see me in 6 months with echo.   Marca Ancona  11/01/2022

## 2022-12-08 DIAGNOSIS — N6489 Other specified disorders of breast: Secondary | ICD-10-CM | POA: Diagnosis not present

## 2022-12-08 DIAGNOSIS — H02846 Edema of left eye, unspecified eyelid: Secondary | ICD-10-CM | POA: Diagnosis not present

## 2022-12-08 DIAGNOSIS — R922 Inconclusive mammogram: Secondary | ICD-10-CM | POA: Diagnosis not present

## 2022-12-08 DIAGNOSIS — M5430 Sciatica, unspecified side: Secondary | ICD-10-CM | POA: Diagnosis not present

## 2022-12-24 ENCOUNTER — Encounter (HOSPITAL_BASED_OUTPATIENT_CLINIC_OR_DEPARTMENT_OTHER): Payer: Self-pay

## 2022-12-24 ENCOUNTER — Emergency Department (HOSPITAL_BASED_OUTPATIENT_CLINIC_OR_DEPARTMENT_OTHER)
Admission: EM | Admit: 2022-12-24 | Discharge: 2022-12-24 | Disposition: A | Discharge status: Home / Self Care | Payer: 59 | Attending: Emergency Medicine | Admitting: Emergency Medicine

## 2022-12-24 ENCOUNTER — Other Ambulatory Visit: Payer: Self-pay

## 2022-12-24 DIAGNOSIS — H00024 Hordeolum internum left upper eyelid: Secondary | ICD-10-CM | POA: Diagnosis not present

## 2022-12-24 DIAGNOSIS — H00021 Hordeolum internum right upper eyelid: Secondary | ICD-10-CM | POA: Diagnosis not present

## 2022-12-24 MED ORDER — FLUCONAZOLE 150 MG PO TABS
150.0000 mg | ORAL_TABLET | Freq: Every day | ORAL | 0 refills | Status: AC
Start: 2022-12-24 — End: ?

## 2022-12-24 MED ORDER — TETRACAINE HCL 0.5 % OP SOLN
2.0000 [drp] | Freq: Once | OPHTHALMIC | Status: AC
  Administered 2022-12-24: 2 [drp] via OPHTHALMIC

## 2022-12-24 MED ORDER — FLUORESCEIN SODIUM 1 MG OP STRP
1.0000 | ORAL_STRIP | Freq: Once | OPHTHALMIC | Status: AC
  Administered 2022-12-24: 1 via OPHTHALMIC

## 2022-12-24 MED ORDER — FLUCONAZOLE 150 MG PO TABS
150.0000 mg | ORAL_TABLET | Freq: Every day | ORAL | 0 refills | Status: DC
Start: 2022-12-24 — End: 2022-12-24

## 2022-12-24 NOTE — Discharge Instructions (Signed)
You may try using topical ointment and antibiotics over the weekend.  Please call the eye doctor on Monday for an appointment.

## 2022-12-24 NOTE — ED Provider Notes (Signed)
[facility] Provider Note   CSN: [id number] Arrival date & time: 12/24/22  1549     History  Chief Complaint  Patient presents with   Eye Problem    [name] is a 52 y.o. female.  Patient presents to the emergency department today for evaluation of recurrent left upper eyelid hordeolum.  She has been dealing with this over the past several weeks.  Initially she tried her erythromycin ointment that was prescribed to her.  This was not helping so she was given a course of Keflex.  Over the following week or so, the area seemed to shrink up a bit, however has returned over the past couple of days.  She states that she was told that she could potentially have it drained if she went to the emergency department.  No vision changes.  No swelling or redness around the eye.  No fever.  No injuries reported.       Home Medications Prior to Admission medications   Medication Sig Start Date End Date Taking? Authorizing Provider  albuterol (PROVENTIL) (2.5 MG/3ML) 0.083% nebulizer solution Take 2.5 mg by nebulization every 6 (six) hours as needed for wheezing or shortness of breath.    [provider]  albuterol (VENTOLIN HFA) 108 (90 Base) MCG/ACT inhaler Inhale 2 puffs into the lungs every 6 (six) hours as needed for wheezing or shortness of breath.    [provider]  benzonatate (TESSALON) 100 MG capsule Take 1 capsule (100 mg total) by mouth as needed for cough. 02/26/21   Clegg, Amy D, NP  betamethasone dipropionate (DIPROLENE) 0.05 % ointment Apply BID every other day to scalp. Avoid face, groin, underarms 05/03/19   [provider]  cholecalciferol (VITAMIN D3) 25 MCG (1000 UNIT) tablet Take 25 mcg by mouth daily.    [provider]  cyclobenzaprine (FLEXERIL) 10 MG tablet Take 1 tablet (10 mg total) by mouth at bedtime as needed for muscle spasms. 01/29/21   Placido Sou, PA-C  dapagliflozin  propanediol (FARXIGA) 10 MG TABS tablet Take 1 tablet (10 mg total) by mouth daily before breakfast. 07/08/21   Laurey Morale, MD  ferrous gluconate (FERGON) 324 MG tablet Take 324 mg by mouth daily.    [provider]  Fluocinolone Acetonide Scalp 0.01 % OIL  04/26/19   [provider]  fluticasone (FLONASE) 50 MCG/ACT nasal spray Place 2 sprays into both nostrils daily as needed. 04/07/21   [provider]  folic acid (FOLVITE) 1 MG tablet Take 1 mg by mouth daily.    [provider]  furosemide (LASIX) 40 MG tablet Take 1 tablet by mouth once daily 10/08/22   Laurey Morale, MD  gabapentin (NEURONTIN) 600 MG tablet Take 600 mg by mouth daily as needed. 03/13/21   [provider]  isosorbide-hydrALAZINE (BIDIL) 20-37.5 MG tablet Take 2 tablets by mouth 3 (three) times daily. 07/08/21   Laurey Morale, MD  leflunomide (ARAVA) 20 MG tablet Take 20 mg by mouth daily. 03/23/19   [provider]  levocetirizine (XYZAL) 5 MG tablet Take 5 mg by mouth at bedtime.    [provider]  meloxicam (MOBIC) 15 MG tablet Take 15 mg by mouth daily as needed. 06/26/21   [provider]  methotrexate 2.5 MG tablet Take 10 mg by mouth once a week. 06/25/21   [provider]  metoprolol succinate (TOPROL-XL) 100 MG 24 hr tablet Take 100 mg  by mouth in the morning and at bedtime. Take with or immediately following a meal.    [provider]  minoxidil (LONITEN) 2.5 MG tablet Take 2.5 mg by mouth daily.    [provider]  montelukast (SINGULAIR) 10 MG tablet Take 10 mg by mouth every morning. 05/17/21   [provider]  MOUNJARO 5 MG/0.5ML Pen Inject 5 mg into the skin once a week. 06/16/21   [provider]  omeprazole (PRILOSEC) 40 MG capsule Take 1 capsule (40 mg total) by mouth daily before breakfast. 03/28/18   Rodolph Bong, MD  potassium chloride SA (KLOR-CON M) 20 MEQ tablet Take 1 tablet (20  mEq total) by mouth daily. 07/08/21   Laurey Morale, MD  sacubitril-valsartan (ENTRESTO) 97-103 MG Take 1 tablet by mouth 2 (two) times daily. 07/08/21   Laurey Morale, MD  spironolactone (ALDACTONE) 25 MG tablet Take 1 tablet (25 mg total) by mouth daily. 07/08/21   Laurey Morale, MD  SYMBICORT 160-4.5 MCG/ACT inhaler Inhale 2 puffs into the lungs daily as needed (for respiratory issues.).  06/01/13   [provider]  traMADol (ULTRAM) 50 MG tablet Take 50 mg by mouth every 4 (four) hours as needed for moderate pain or severe pain.    [provider]  traZODone (DESYREL) 50 MG tablet Take 50 mg by mouth at bedtime. 05/21/21   [provider]  valACYclovir (VALTREX) 500 MG tablet Take 500 mg by mouth 2 (two) times daily as needed (for cold sores.).    [provider]      Allergies    Clarithromycin, Hydrocodone, Ondansetron, Penicillins, and Tdap [tetanus-diphth-acell pertussis]    Review of Systems   Review of Systems  Physical Exam Updated Vital Signs BP (!) 121/90 (BP Location: Right Arm)   Pulse 92   Temp 98.5 F (36.9 C) (Oral)   Resp 20   LMP 08/10/2019 (Exact Date)   SpO2 99%  Physical Exam Vitals and nursing note reviewed.  Constitutional:      Appearance: She is well-developed.  HENT:     Head: Normocephalic and atraumatic.  Eyes:     General: Lids are normal.        Left eye: Hordeolum (Left upper eyelid slightly lateral) present.    Conjunctiva/sclera:     Right eye: Right conjunctiva is not injected. No exudate.    Left eye: Left conjunctiva is not injected. No exudate.  Pulmonary:     Effort: No respiratory distress.  Musculoskeletal:     Cervical back: Normal range of motion and neck supple.  Skin:    General: Skin is warm and dry.  Neurological:     Mental Status: She is alert.     ED Results / Procedures / Treatments   Labs (all labs ordered are listed, but only abnormal results are displayed) Labs Reviewed -  No data to display  EKG None  Radiology No results found.  Procedures Procedures    Medications Ordered in ED Medications  tetracaine (PONTOCAINE) 0.5 % ophthalmic solution 2 drop (has no administration in time range)  fluorescein ophthalmic strip 1 strip (has no administration in time range)    ED Course/ Medical Decision Making/ A&P    Patient seen and examined. History obtained directly from patient.   Labs/EKG: None ordered  Imaging: None ordered  Medications/Fluids: None ordered  Most recent vital signs reviewed and are as follows: BP (!) 121/90 (BP Location: Right Arm)   Pulse 92  Temp 98.5 F (36.9 C) (Oral)   Resp 20   LMP 08/10/2019 (Exact Date)   SpO2 99%   Initial impression: Internal hordeolum, left upper eyelid  Home treatment plan: Patient has leftover erythromycin ointment and Keflex.  I encouraged her to take this over the weekend if desired.  Return instructions discussed with patient: New or worsening symptoms  Follow-up instructions discussed with patient: Ophthalmology in 3 days.                                Medical Decision Making Risk Prescription drug management.   Upper eyelid hordeolum.  No foreign bodies noted. No surrounding erythema, swelling, vision changes/loss suspicious for orbital or periorbital cellulitis. No signs of iritis. No signs of glaucoma. No symptoms of retinal detachment. No ophthalmologic emergency suspected.  Patient appears to have failed standard outpatient treatment.  She will be referred to ophthalmology for evaluation to see if she could be a candidate for excision or other treatment.         Final Clinical Impression(s) / ED Diagnoses Final diagnoses:  Hordeolum internum of left upper eyelid    Rx / DC Orders ED Discharge Orders     None         Renne Crigler, PA-C 12/24/22 1836    Loetta Rough, MD 12/24/22 2350

## 2022-12-24 NOTE — ED Triage Notes (Signed)
Pt presents with complaints of left upper eyelid swelling x 1 month. Pt seen by PCP and given oral and eye ointment for 7 days. Took kefelx and erthromycin ointment Swelling had decreased significantly and then began swelling again. Denies pain.

## 2022-12-27 DIAGNOSIS — H0016 Chalazion left eye, unspecified eyelid: Secondary | ICD-10-CM | POA: Diagnosis not present

## 2023-01-04 DIAGNOSIS — E119 Type 2 diabetes mellitus without complications: Secondary | ICD-10-CM | POA: Diagnosis not present

## 2023-01-04 DIAGNOSIS — H00014 Hordeolum externum left upper eyelid: Secondary | ICD-10-CM | POA: Diagnosis not present

## 2023-01-11 DIAGNOSIS — Z23 Encounter for immunization: Secondary | ICD-10-CM | POA: Diagnosis not present

## 2023-01-21 DIAGNOSIS — J0100 Acute maxillary sinusitis, unspecified: Secondary | ICD-10-CM | POA: Diagnosis not present

## 2023-01-21 DIAGNOSIS — R110 Nausea: Secondary | ICD-10-CM | POA: Diagnosis not present

## 2023-01-27 DIAGNOSIS — H00024 Hordeolum internum left upper eyelid: Secondary | ICD-10-CM | POA: Diagnosis not present

## 2023-01-27 DIAGNOSIS — H04123 Dry eye syndrome of bilateral lacrimal glands: Secondary | ICD-10-CM | POA: Diagnosis not present

## 2023-01-31 ENCOUNTER — Telehealth (HOSPITAL_COMMUNITY): Payer: Self-pay

## 2023-01-31 NOTE — Telephone Encounter (Signed)
Called and was unable to leave patient a voice message to confirm/remind patient of their appointment at the [facility] on 02/01/23.

## 2023-02-01 ENCOUNTER — Encounter (HOSPITAL_COMMUNITY): Payer: 59

## 2023-02-10 DIAGNOSIS — J069 Acute upper respiratory infection, unspecified: Secondary | ICD-10-CM | POA: Diagnosis not present

## 2023-02-10 DIAGNOSIS — R053 Chronic cough: Secondary | ICD-10-CM | POA: Diagnosis not present

## 2023-02-11 DIAGNOSIS — L648 Other androgenic alopecia: Secondary | ICD-10-CM | POA: Diagnosis not present

## 2023-03-28 DIAGNOSIS — N6325 Unspecified lump in the left breast, overlapping quadrants: Secondary | ICD-10-CM | POA: Diagnosis not present

## 2023-03-28 DIAGNOSIS — R922 Inconclusive mammogram: Secondary | ICD-10-CM | POA: Diagnosis not present

## 2023-04-20 ENCOUNTER — Other Ambulatory Visit: Payer: Self-pay | Admitting: Radiology

## 2023-04-20 DIAGNOSIS — N6322 Unspecified lump in the left breast, upper inner quadrant: Secondary | ICD-10-CM | POA: Diagnosis not present

## 2023-04-20 DIAGNOSIS — N6012 Diffuse cystic mastopathy of left breast: Secondary | ICD-10-CM | POA: Diagnosis not present

## 2023-04-21 LAB — SURGICAL PATHOLOGY

## 2023-05-31 ENCOUNTER — Other Ambulatory Visit (HOSPITAL_COMMUNITY): Payer: Self-pay | Admitting: Cardiology

## 2023-05-31 MED ORDER — FUROSEMIDE 40 MG PO TABS: 40.0000 mg | ORAL_TABLET | Freq: Every day | ORAL | 3 refills | Status: DC

## 2023-05-31 MED ORDER — ISOSORB DINITRATE-HYDRALAZINE 20-37.5 MG PO TABS: 2.0000 | ORAL_TABLET | Freq: Three times a day (TID) | ORAL | 3 refills | Status: AC

## 2023-05-31 MED ORDER — POTASSIUM CHLORIDE CRYS ER 20 MEQ PO TBCR: 20.0000 meq | EXTENDED_RELEASE_TABLET | Freq: Every day | ORAL | 3 refills | Status: DC

## 2023-05-31 MED ORDER — SACUBITRIL-VALSARTAN 97-103 MG PO TABS: 1.0000 | ORAL_TABLET | Freq: Two times a day (BID) | ORAL | 3 refills | Status: AC

## 2023-05-31 MED ORDER — DAPAGLIFLOZIN PROPANEDIOL 10 MG PO TABS: 10.0000 mg | ORAL_TABLET | Freq: Every day | ORAL | 3 refills | Status: DC

## 2023-05-31 MED ORDER — METOPROLOL SUCCINATE ER 100 MG PO TB24: 100.0000 mg | ORAL_TABLET | Freq: Two times a day (BID) | ORAL | 3 refills | Status: AC

## 2023-05-31 MED ORDER — SPIRONOLACTONE 25 MG PO TABS: 25.0000 mg | ORAL_TABLET | Freq: Every day | ORAL | 3 refills | Status: AC

## 2023-06-07 DIAGNOSIS — Z79899 Other long term (current) drug therapy: Secondary | ICD-10-CM | POA: Diagnosis not present

## 2023-06-07 DIAGNOSIS — M0600 Rheumatoid arthritis without rheumatoid factor, unspecified site: Secondary | ICD-10-CM | POA: Diagnosis not present

## 2023-06-07 DIAGNOSIS — M2550 Pain in unspecified joint: Secondary | ICD-10-CM | POA: Diagnosis not present

## 2023-06-27 NOTE — Progress Notes (Signed)
 [facility] Note   PCP: Dr. Hyman Hopes Cardiology: Dr. Clifton James HF Cardiology: Dr. Shirlee Latch  53 y.o. with history of chronic systolic CHF due to NICM and rheumatoid arthritis.   LHC in 2012 showed no significant disease. EF has been in the 25-30% range long-term. She has no family history of cardiomyopathy and she has never been a heavy drinker. She has not used illicit drugs. RHC in 10/18 showed preserved cardiac output with mildly increased filling pressures. She was seen by EP (Camnitz) and decided against ICD.    EF improved to 40-45% with diffuse hypokinesis, normal RV from 2019-2021. Echo in 5/24 showed EF 30-35%, normal RV, normal IVC.    Today she returns for HF follow up. Overall feeling fine. Denies increasing SOB, palpitations, abnormal bleeding, CP, dizziness, edema, or PND/Orthopnea. Does have baseline fatigue. She is able to perform errands without problem. Appetite ok. No fever or chills. She has been gaining weight, although on Mounjaro, had initially lost weight but it is on the rise again. Reports taking all medications, however dispense report shows 17% fill coverage for last 180 days. Filled last month.   SH: Lives alone.  Rare ETOH, no drugs.  No smoking.   Family History  Problem Relation Age of Onset   Heart attack Mother    CAD Mother    Allergies Mother    Breast cancer Mother    Hypertension Other    Diabetes Mellitus II Other    ROS: All systems reviewed and negative except as per HPI.   Current Outpatient Medications  Medication Sig Dispense Refill   albuterol (PROVENTIL) (2.5 MG/3ML) 0.083% nebulizer solution Take 2.5 mg by nebulization every 6 (six) hours as needed for wheezing or shortness of breath.     albuterol (VENTOLIN HFA) 108 (90 Base) MCG/ACT inhaler Inhale 2 puffs into the lungs every 6 (six) hours as needed for wheezing or shortness of breath.     benzonatate (TESSALON) 100 MG capsule Take 1 capsule (100 mg total) by mouth as  needed for cough. 20 capsule 1   betamethasone dipropionate (DIPROLENE) 0.05 % ointment As needed     cholecalciferol (VITAMIN D3) 25 MCG (1000 UNIT) tablet Take 25 mcg by mouth daily.     cyclobenzaprine (FLEXERIL) 10 MG tablet Take 1 tablet (10 mg total) by mouth at bedtime as needed for muscle spasms. 8 tablet 0   ferrous gluconate (FERGON) 324 MG tablet Take 324 mg by mouth daily.     fluconazole (DIFLUCAN) 150 MG tablet Take 1 tablet (150 mg total) by mouth daily. (Patient taking differently: Take 150 mg by mouth daily as needed.) 1 tablet 0   Fluocinolone Acetonide Scalp 0.01 % OIL daily as needed.     fluticasone (FLONASE) 50 MCG/ACT nasal spray Place 2 sprays into both nostrils daily as needed.     folic acid (FOLVITE) 1 MG tablet Take 1 mg by mouth daily.     furosemide (LASIX) 40 MG tablet Take 1 tablet (40 mg total) by mouth daily. (Patient taking differently: Take 40 mg by mouth daily. As needed) 90 tablet 3   gabapentin (NEURONTIN) 600 MG tablet Take 600 mg by mouth daily as needed.     isosorbide-hydrALAZINE (BIDIL) 20-37.5 MG tablet Take 2 tablets by mouth 3 (three) times daily. 540 tablet 3   leflunomide (ARAVA) 20 MG tablet Take 20 mg by mouth daily.     levocetirizine (XYZAL) 5 MG tablet Take 5 mg by mouth at bedtime.  meloxicam (MOBIC) 15 MG tablet Take 15 mg by mouth daily as needed.     metoprolol succinate (TOPROL-XL) 100 MG 24 hr tablet Take 1 tablet (100 mg total) by mouth in the morning and at bedtime. Take with or immediately following a meal. 180 tablet 3   minoxidil (LONITEN) 2.5 MG tablet Take 2.5 mg by mouth daily.     montelukast (SINGULAIR) 10 MG tablet Take 10 mg by mouth every morning.     MOUNJARO 5 MG/0.5ML Pen Inject 5 mg into the skin once a week. On Wednesdays     omeprazole (PRILOSEC) 40 MG capsule Take 1 capsule (40 mg total) by mouth daily before breakfast. 30 capsule 0   potassium chloride SA (KLOR-CON M) 20 MEQ tablet Take 1 tablet (20 mEq total) by  mouth daily. 90 tablet 3   sacubitril-valsartan (ENTRESTO) 97-103 MG Take 1 tablet by mouth 2 (two) times daily. 180 tablet 3   spironolactone (ALDACTONE) 25 MG tablet Take 1 tablet (25 mg total) by mouth daily. 90 tablet 3   SYMBICORT 160-4.5 MCG/ACT inhaler Inhale 2 puffs into the lungs daily as needed (for respiratory issues.).      traZODone (DESYREL) 50 MG tablet Take 50 mg by mouth at bedtime.     valACYclovir (VALTREX) 500 MG tablet Take 500 mg by mouth 2 (two) times daily as needed (for cold sores.).     No current facility-administered medications for this encounter.   Wt Readings from Last 3 Encounters:  07/04/23 103.1 kg (227 lb 6.4 oz)  11/01/22 95.9 kg (211 lb 6.4 oz)  05/26/22 89.6 kg (197 lb 9.6 oz)   Vitals:   07/04/23 0902  BP: 118/76  Pulse: 79  SpO2: 100%   Physical Exam: General: Well appearing. No distress on RA. Walked into clinic. Cardiac: JVP difficult to assess. S1 and S2 present. No murmurs or rub Abdomen: Soft, non-tender, non-distended. Extremities: Warm and dry. No edema.  Neuro: Alert and oriented x3. Affect pleasant. Moves all extremities without difficulty.  ECG (personally reviewed): NSR 80 bpm, QRS 92 ms  Assessment/Plan: 1. Chronic systolic CHF: NICM. LHC 2012 no significant coronary disease. Etiology uncertain, possible prior viral myocarditis. cMRI in 12/14 with no LGE noted. No heavy drinking or drugs. No history of familial cardiomyopathy.  EF had improved to 40-45 in 2020 and 2021 with a drop to 25-35% in subsequent years. Echo today with estimated EF 26%, official read pending, will call back with results. NYHA class II symptoms. - EF appears <35%, refer to EP for ICD, not CRT-D candidate with narrow QRS. Could consider for Barostim or CCM in the future if symptoms worsen.  - Reports compliance, however dispense report shows 17% coverage for last 180 days.  - Volume difficult to assess with body habitus. - Continue Lasix PRN. BMET/BNP  today - Continue spironolactone 25 mg daily.   - Continue Toprol XL 100 mg bid.   - Continue Bidil 2 tabs tid.   - Continue Entresto 97/103 bid.    - Off SLGT2i with frequent yeast infections and UTIs 2. Rheumatoid arthritis: She sees a rheumatologist, now on Enbrel.  3. Obesity: Body mass index is 41.59 kg/m. - Back on Mounjaro and starting to lose weight again.  4. HTN: controlled on GDMT + Bidil  Follow-up in 3 months with Dr. McLean  [name]  07/04/2023

## 2023-07-01 ENCOUNTER — Telehealth (HOSPITAL_COMMUNITY): Payer: Self-pay

## 2023-07-01 NOTE — Telephone Encounter (Signed)
 Called to confirm/remind patient of their appointment at the [facility] on 07/04/23.   Appointment:   [] Confirmed  [x] Left mess   [] No answer/No voice mail  [] Phone not in service  And to bring in all medications and/or complete list.

## 2023-07-04 ENCOUNTER — Encounter (HOSPITAL_COMMUNITY): Payer: Self-pay

## 2023-07-04 ENCOUNTER — Ambulatory Visit (HOSPITAL_COMMUNITY)
Admission: RE | Admit: 2023-07-04 | Discharge: 2023-07-04 | Disposition: A | Discharge status: Home / Self Care | Payer: 59 | Source: Ambulatory Visit | Attending: Cardiology | Admitting: Cardiology

## 2023-07-04 ENCOUNTER — Ambulatory Visit (HOSPITAL_BASED_OUTPATIENT_CLINIC_OR_DEPARTMENT_OTHER)
Admission: RE | Admit: 2023-07-04 | Discharge: 2023-07-04 | Disposition: A | Discharge status: Home / Self Care | Payer: 59 | Source: Ambulatory Visit | Attending: Cardiology

## 2023-07-04 VITALS — BP 118/76 | HR 79 | Wt 227.4 lb

## 2023-07-04 DIAGNOSIS — M05731 Rheumatoid arthritis with rheumatoid factor of right wrist without organ or systems involvement: Secondary | ICD-10-CM | POA: Diagnosis not present

## 2023-07-04 DIAGNOSIS — M069 Rheumatoid arthritis, unspecified: Secondary | ICD-10-CM | POA: Diagnosis not present

## 2023-07-04 DIAGNOSIS — I5022 Chronic systolic (congestive) heart failure: Secondary | ICD-10-CM

## 2023-07-04 DIAGNOSIS — Z6841 Body Mass Index (BMI) 40.0 and over, adult: Secondary | ICD-10-CM | POA: Insufficient documentation

## 2023-07-04 DIAGNOSIS — Z79899 Other long term (current) drug therapy: Secondary | ICD-10-CM | POA: Insufficient documentation

## 2023-07-04 DIAGNOSIS — E119 Type 2 diabetes mellitus without complications: Secondary | ICD-10-CM | POA: Insufficient documentation

## 2023-07-04 DIAGNOSIS — Z006 Encounter for examination for normal comparison and control in clinical research program: Secondary | ICD-10-CM

## 2023-07-04 DIAGNOSIS — I428 Other cardiomyopathies: Secondary | ICD-10-CM | POA: Diagnosis not present

## 2023-07-04 DIAGNOSIS — I10 Essential (primary) hypertension: Secondary | ICD-10-CM

## 2023-07-04 DIAGNOSIS — E669 Obesity, unspecified: Secondary | ICD-10-CM | POA: Insufficient documentation

## 2023-07-04 DIAGNOSIS — M05732 Rheumatoid arthritis with rheumatoid factor of left wrist without organ or systems involvement: Secondary | ICD-10-CM | POA: Diagnosis not present

## 2023-07-04 DIAGNOSIS — I110 Hypertensive heart disease with heart failure: Secondary | ICD-10-CM | POA: Diagnosis not present

## 2023-07-04 DIAGNOSIS — E66813 Obesity, class 3: Secondary | ICD-10-CM

## 2023-07-04 LAB — BASIC METABOLIC PANEL
Anion gap: 6 (ref 5–15)
BUN: 14 mg/dL (ref 6–20)
CO2: 25 mmol/L (ref 22–32)
Calcium: 8.8 mg/dL — ABNORMAL LOW (ref 8.9–10.3)
Chloride: 108 mmol/L (ref 98–111)
Creatinine, Ser: 0.77 mg/dL (ref 0.44–1.00)
GFR, Estimated: >60 mL/min (ref >60)
Glucose, Bld: 114 mg/dL — ABNORMAL HIGH (ref 70–99)
Potassium: 4.1 mmol/L (ref 3.5–5.1)
Sodium: 139 mmol/L (ref 135–145)

## 2023-07-04 LAB — ECHOCARDIOGRAM COMPLETE
AR max vel: 1.77 cm2
AV Peak grad: 5.5 mmHg
Ao pk vel: 1.17 m/s
Area-P 1/2: 4.8 cm2
Calc EF: 25.3 %
S' Lateral: 5.1 cm
Single Plane A2C EF: 24.9 %
Single Plane A4C EF: 24.6 %

## 2023-07-04 LAB — BRAIN NATRIURETIC PEPTIDE: B Natriuretic Peptide: 239 pg/mL — ABNORMAL HIGH (ref 0.0–100.0)

## 2023-07-04 NOTE — Addendum Note (Signed)
 Encounter addended by: Demetrius Charity, RN on: 07/04/2023 9:52 AM  Actions taken: Order list changed, Diagnosis association updated, Flowsheet accepted, Clinical Note Signed, Charge Capture section accepted

## 2023-07-04 NOTE — Research (Signed)
 SITE: [id number]     Subject # [id number]   Subprotocol: A  Inclusion Criteria  Patients who meet all of the following criteria are eligible for enrollment as study participants:  Yes No  Age > 52 years old X   Eligible to wear Holter Study X    Exclusion Criteria  Patients who meet any of these criteria are not eligible for enrollment as study participants: Yes No  1. Receiving any mechanical (respiratory or circulatory) or renal support therapy at Screening or during Visit #1.  X  2.  Any other conditions that in the opinion of the investigators are likely to prevent compliance with the study protocol or pose a safety concern if the subject participates in the study.  X  3. Poor tolerance, namely susceptible to severe skin allergies from ECG adhesive patch application.  X   Protocol: REV H                                     Residential Zip code [address] (First 3 digits ONLY)                                             [facility] Informed Consent   Subject Name: [name]  Subject met inclusion and exclusion criteria.  The informed consent form, study requirements and expectations were reviewed with the subject. Subject had opportunity to read consent and questions and concerns were addressed prior to the signing of the consent form.  The subject verbalized understanding of the trial requirements.  The subject agreed to participate in the [facility] EF ACT trial and signed the informed consent at 09:12 on 04-Jul-2022.  The informed consent was obtained prior to performance of any protocol-specific procedures for the subject.  A copy of the signed informed consent was given to the subject and a copy was placed in the subject's medical record.   Dyanne Iha          Current Outpatient Medications:    albuterol (PROVENTIL) (2.5 MG/3ML) 0.083% nebulizer solution, Take 2.5 mg by nebulization every 6 (six) hours as needed for wheezing or shortness of breath., Disp: , Rfl:    albuterol (VENTOLIN  HFA) 108 (90 Base) MCG/ACT inhaler, Inhale 2 puffs into the lungs every 6 (six) hours as needed for wheezing or shortness of breath., Disp: , Rfl:    benzonatate (TESSALON) 100 MG capsule, Take 1 capsule (100 mg total) by mouth as needed for cough., Disp: 20 capsule, Rfl: 1   betamethasone dipropionate (DIPROLENE) 0.05 % ointment, As needed, Disp: , Rfl:    cholecalciferol (VITAMIN D3) 25 MCG (1000 UNIT) tablet, Take 25 mcg by mouth daily., Disp: , Rfl:    cyclobenzaprine (FLEXERIL) 10 MG tablet, Take 1 tablet (10 mg total) by mouth at bedtime as needed for muscle spasms., Disp: 8 tablet, Rfl: 0   ferrous gluconate (FERGON) 324 MG tablet, Take 324 mg by mouth daily., Disp: , Rfl:    fluconazole (DIFLUCAN) 150 MG tablet, Take 1 tablet (150 mg total) by mouth daily. (Patient taking differently: Take 150 mg by mouth daily as needed.), Disp: 1 tablet, Rfl: 0   Fluocinolone Acetonide Scalp 0.01 % OIL, daily as needed., Disp: , Rfl:    fluticasone (FLONASE) 50 MCG/ACT nasal spray, Place 2  sprays into both nostrils daily as needed., Disp: , Rfl:    folic acid (FOLVITE) 1 MG tablet, Take 1 mg by mouth daily., Disp: , Rfl:    furosemide (LASIX) 40 MG tablet, Take 1 tablet (40 mg total) by mouth daily. (Patient taking differently: Take 40 mg by mouth daily. As needed), Disp: 90 tablet, Rfl: 3   gabapentin (NEURONTIN) 600 MG tablet, Take 600 mg by mouth daily as needed., Disp: , Rfl:    isosorbide-hydrALAZINE (BIDIL) 20-37.5 MG tablet, Take 2 tablets by mouth 3 (three) times daily., Disp: 540 tablet, Rfl: 3   leflunomide (ARAVA) 20 MG tablet, Take 20 mg by mouth daily., Disp: , Rfl:    levocetirizine (XYZAL) 5 MG tablet, Take 5 mg by mouth at bedtime., Disp: , Rfl:    meloxicam (MOBIC) 15 MG tablet, Take 15 mg by mouth daily as needed., Disp: , Rfl:    metoprolol succinate (TOPROL-XL) 100 MG 24 hr tablet, Take 1 tablet (100 mg total) by mouth in the morning and at bedtime. Take with or immediately following a  meal., Disp: 180 tablet, Rfl: 3   minoxidil (LONITEN) 2.5 MG tablet, Take 2.5 mg by mouth daily., Disp: , Rfl:    montelukast (SINGULAIR) 10 MG tablet, Take 10 mg by mouth every morning., Disp: , Rfl:    MOUNJARO 5 MG/0.5ML Pen, Inject 5 mg into the skin once a week. On Wednesdays, Disp: , Rfl:    omeprazole (PRILOSEC) 40 MG capsule, Take 1 capsule (40 mg total) by mouth daily before breakfast., Disp: 30 capsule, Rfl: 0   potassium chloride SA (KLOR-CON M) 20 MEQ tablet, Take 1 tablet (20 mEq total) by mouth daily., Disp: 90 tablet, Rfl: 3   sacubitril-valsartan (ENTRESTO) 97-103 MG, Take 1 tablet by mouth 2 (two) times daily., Disp: 180 tablet, Rfl: 3   spironolactone (ALDACTONE) 25 MG tablet, Take 1 tablet (25 mg total) by mouth daily., Disp: 90 tablet, Rfl: 3   SYMBICORT 160-4.5 MCG/ACT inhaler, Inhale 2 puffs into the lungs daily as needed (for respiratory issues.). , Disp: , Rfl:    traZODone (DESYREL) 50 MG tablet, Take 50 mg by mouth at bedtime., Disp: , Rfl:    valACYclovir (VALTREX) 500 MG tablet, Take 500 mg by mouth 2 (two) times daily as needed (for cold sores.)., Disp: , Rfl:

## 2023-07-04 NOTE — Progress Notes (Signed)
 Echocardiogram 2D Echocardiogram has been performed.  [name] 07/04/2023, 8:47 AM

## 2023-07-04 NOTE — Patient Instructions (Addendum)
 Thank you for coming in today  If you had labs drawn today, any labs that are abnormal the clinic will call you No news is good news  You have been referred to EP clinic  , their office will call you for further appointment details    Medications: No changes  Follow up appointments:  Your physician recommends that you schedule a follow-up appointment in:  3 months With Dr. Shirlee Latch    Do the following things EVERYDAY: Weigh yourself in the morning before breakfast. Write it down and keep it in a log. Take your medicines as prescribed Eat low salt foods--Limit salt (sodium) to 2000 mg per day.  Stay as active as you can everyday Limit all fluids for the day to less than 2 liters   At the [facility], you and your health needs are our priority. As part of our continuing mission to provide you with exceptional heart care, we have created designated Provider Care Teams. These Care Teams include your primary Cardiologist (physician) and Advanced Practice Providers (APPs- Physician Assistants and Nurse Practitioners) who all work together to provide you with the care you need, when you need it.   You may see any of the following providers on your designated Care Team at your next follow up: Dr Arvilla Meres Dr Marca Ancona Dr. Marcos Eke, NP Robbie Lis, [place] [facility] [place], [place] Brynda Peon, NP Karle Plumber, PharmD   Please be sure to bring in all your medications bottles to every appointment.    Thank you for choosing [facility]  If you have any questions or concerns before your next appointment please send [place] a message through [place] or call our office at [phone].    TO LEAVE A MESSAGE FOR THE NURSE SELECT OPTION 2, PLEASE LEAVE A MESSAGE INCLUDING: YOUR NAME DATE OF BIRTH CALL BACK NUMBER REASON FOR CALL**this is important as we prioritize the call backs  YOU WILL  RECEIVE A CALL BACK THE SAME DAY AS LONG AS YOU CALL BEFORE 4:00 PM

## 2023-07-08 ENCOUNTER — Encounter (HOSPITAL_COMMUNITY): Payer: Self-pay

## 2023-07-14 DIAGNOSIS — I5022 Chronic systolic (congestive) heart failure: Secondary | ICD-10-CM | POA: Diagnosis not present

## 2023-07-14 DIAGNOSIS — M79631 Pain in right forearm: Secondary | ICD-10-CM | POA: Diagnosis not present

## 2023-07-25 ENCOUNTER — Telehealth (HOSPITAL_COMMUNITY): Payer: Self-pay

## 2023-07-25 ENCOUNTER — Ambulatory Visit (HOSPITAL_COMMUNITY)
Admission: RE | Admit: 2023-07-25 | Discharge: 2023-07-25 | Disposition: A | Discharge status: Home / Self Care | Source: Ambulatory Visit | Attending: Adult Health | Admitting: Adult Health

## 2023-07-25 VITALS — BP 158/98 | HR 100 | Wt 225.4 lb

## 2023-07-25 DIAGNOSIS — I428 Other cardiomyopathies: Secondary | ICD-10-CM | POA: Diagnosis not present

## 2023-07-25 DIAGNOSIS — Z6841 Body Mass Index (BMI) 40.0 and over, adult: Secondary | ICD-10-CM | POA: Insufficient documentation

## 2023-07-25 DIAGNOSIS — M069 Rheumatoid arthritis, unspecified: Secondary | ICD-10-CM | POA: Insufficient documentation

## 2023-07-25 DIAGNOSIS — R5383 Other fatigue: Secondary | ICD-10-CM | POA: Diagnosis present

## 2023-07-25 DIAGNOSIS — I5022 Chronic systolic (congestive) heart failure: Secondary | ICD-10-CM | POA: Diagnosis not present

## 2023-07-25 DIAGNOSIS — E66813 Obesity, class 3: Secondary | ICD-10-CM | POA: Diagnosis not present

## 2023-07-25 DIAGNOSIS — T733XXA Exhaustion due to excessive exertion, initial encounter: Secondary | ICD-10-CM | POA: Insufficient documentation

## 2023-07-25 DIAGNOSIS — E669 Obesity, unspecified: Secondary | ICD-10-CM | POA: Insufficient documentation

## 2023-07-25 LAB — BASIC METABOLIC PANEL WITH GFR
Anion gap: 10 (ref 5–15)
BUN: 14 mg/dL (ref 6–20)
CO2: 25 mmol/L (ref 22–32)
Calcium: 8.6 mg/dL — ABNORMAL LOW (ref 8.9–10.3)
Chloride: 103 mmol/L (ref 98–111)
Creatinine, Ser: 0.76 mg/dL (ref 0.44–1.00)
GFR, Estimated: >60 mL/min (ref >60)
Glucose, Bld: 102 mg/dL — ABNORMAL HIGH (ref 70–99)
Potassium: 3.1 mmol/L — ABNORMAL LOW (ref 3.5–5.1)
Sodium: 138 mmol/L (ref 135–145)

## 2023-07-25 LAB — FERRITIN: Ferritin: 171 ng/mL (ref 11–307)

## 2023-07-25 LAB — IRON AND TIBC
Iron: 59 ug/dL (ref 28–170)
Saturation Ratios: 18 % (ref 10.4–31.8)
TIBC: 333 ug/dL (ref 250–450)
UIBC: 274 ug/dL

## 2023-07-25 LAB — CBC
HCT: 37.1 % (ref 36.0–46.0)
Hemoglobin: 11.5 g/dL — ABNORMAL LOW (ref 12.0–15.0)
MCH: 27.3 pg (ref 26.0–34.0)
MCHC: 31 g/dL (ref 30.0–36.0)
MCV: 88.1 fL (ref 80.0–100.0)
Platelets: 254 10*3/uL (ref 150–400)
RBC: 4.21 MIL/uL (ref 3.87–5.11)
RDW: 14.5 % (ref 11.5–15.5)
WBC: 6.1 10*3/uL (ref 4.0–10.5)
nRBC: 0 % (ref 0.0–0.2)

## 2023-07-25 LAB — BRAIN NATRIURETIC PEPTIDE: B Natriuretic Peptide: 160.5 pg/mL — ABNORMAL HIGH (ref 0.0–100.0)

## 2023-07-25 LAB — VITAMIN B12: Vitamin B-12: 359 pg/mL (ref 180–914)

## 2023-07-25 LAB — FOLATE: Folate: 14.6 ng/mL (ref >5.9)

## 2023-07-25 MED ORDER — FUROSEMIDE 40 MG PO TABS: 40.0000 mg | ORAL_TABLET | Freq: Every day | ORAL | 3 refills | Status: DC

## 2023-07-25 NOTE — Progress Notes (Signed)
PCP: Dr. Sabra Cramp Cardiology: Dr. Abel Hoe HF Cardiology: Dr. Mitzie Anda  53 y.o. with history of nonischemic cardiomyopathy/chronic systolic CHF and rheumatoid arthritis.   Patient has had a cardiomyopathy known for a number of years now.  Coronary angiography in 2012 showed no significant disease.  EF has been in the 25-30% range long-term.  Most recent echo in 8/18 showed EF remains 25-30%.  She has no family history of cardiomyopathy and she has never been a heavy drinker.  She has not used illicit drugs.  RHC in 10/18 showed preserved cardiac output with mildly increased filling pressures.  Lasix was increased to 120 qam/80 qpm.  She was seen by EP ([facility]) and decided against ICD.    Echo in 3/23 showed EF lower at 30-35%, mild-moderate LV dilation, normal RV.  Echo in 12/23 showed EF 25-30%, global hypokinesis, LV mildly dilated, mildly decreased RV systolic function, IVC normal. Echo in 5/24 showed EF 30-35%, normal RV, normal IVC.     06/2023 Echo EF 25-30%. She was referred to EP for ICD.    She returns for HF follow up. Over the weekend she felt like she had fluid overload and took lasix 120 mg (Saturday) and 80 mg ( Sunday). She called for an acute work in due to fatigue. She had her last menstrual cycle about a month ago and it last for 5 days. She denies shortness of breath. She avoids walking up steps. Denies PND/Orthopnea. Appetite ok. Eating wings a few days a week. No fever or chills. She has not been weighing at home.  She has not had her medications today because she over slept. Usually takes all medications. Working for [employer] 5 days a week.   PMH: 1. Rheumatoid arthritis: Followed by Dr. Marylin So.  2. Type II diabetes.  3. HTN 4. Chronic systolic CHF: Nonischemic cardiomyopathy.  - LHC (2012): No significant CAD.  - Echo (7/13): EF 30-35% - Cardiac MRI (12/14): Mild LV dilation, EF 40%, normal RV size and systolic function, no late gadolinium enhancement.  - Echo (7/16): EF  25-30% - Echo (8/18): EF 25-30% - RHC (10/18): mean RA 11, PA 47/20 mean 32, mean PCWP 16, CI 3.06 Fick/3.5 thermo, PVR 2.65 WU.  - Echo (10/19): EF 40-45%, diffuse hypokinesis, moderate diastolic dysfunction, normal RV size and systolic function  - Echo (2/21): EF 40-45%, diffuse hypokinesis.  - Echo (2/23): EF lower at 30-35%, mild-moderate LV dilation, normal RV.  - Echo (12/23): EF 25-30%, global hypokinesis, LV mildly dilated, mildly decreased RV systolic function, IVC normal.  - Echo (5/24): EF 30-35%, normal RV, normal IVC. -Echo (3/25):  EF 25-30%. Referred to EP for ICD.   SH: Lives alone.  Rare ETOH, no drugs.  No smoking.   Family History  Problem Relation Age of Onset   Heart attack Mother    CAD Mother    Allergies Mother    Breast cancer Mother    Hypertension Other    Diabetes Mellitus II Other    ROS: All systems reviewed and negative except as per HPI.   Current Outpatient Medications  Medication Sig Dispense Refill   albuterol (PROVENTIL) (2.5 MG/3ML) 0.083% nebulizer solution Take 2.5 mg by nebulization every 6 (six) hours as needed for wheezing or shortness of breath.     albuterol (VENTOLIN HFA) 108 (90 Base) MCG/ACT inhaler Inhale 2 puffs into the lungs every 6 (six) hours as needed for wheezing or shortness of breath.     benzonatate (TESSALON) 100 MG capsule Take  1 capsule (100 mg total) by mouth as needed for cough. 20 capsule 1   betamethasone dipropionate (DIPROLENE) 0.05 % ointment As needed     cholecalciferol (VITAMIN D3) 25 MCG (1000 UNIT) tablet Take 25 mcg by mouth daily.     cyclobenzaprine (FLEXERIL) 10 MG tablet Take 1 tablet (10 mg total) by mouth at bedtime as needed for muscle spasms. 8 tablet 0   ferrous gluconate (FERGON) 324 MG tablet Take 324 mg by mouth daily.     fluconazole (DIFLUCAN) 150 MG tablet Take 1 tablet (150 mg total) by mouth daily. (Patient taking differently: Take 150 mg by mouth daily as needed.) 1 tablet 0   Fluocinolone  Acetonide Scalp 0.01 % OIL daily as needed.     fluticasone (FLONASE) 50 MCG/ACT nasal spray Place 2 sprays into both nostrils daily as needed.     folic acid (FOLVITE) 1 MG tablet Take 1 mg by mouth daily.     furosemide (LASIX) 40 MG tablet Take 1 tablet (40 mg total) by mouth daily. (Patient taking differently: Take 40 mg by mouth daily. As needed) 90 tablet 3   gabapentin (NEURONTIN) 600 MG tablet Take 600 mg by mouth daily as needed.     isosorbide-hydrALAZINE (BIDIL) 20-37.5 MG tablet Take 2 tablets by mouth 3 (three) times daily. 540 tablet 3   leflunomide (ARAVA) 20 MG tablet Take 20 mg by mouth daily.     levocetirizine (XYZAL) 5 MG tablet Take 5 mg by mouth at bedtime.     meloxicam (MOBIC) 15 MG tablet Take 15 mg by mouth daily as needed.     metoprolol succinate (TOPROL-XL) 100 MG 24 hr tablet Take 1 tablet (100 mg total) by mouth in the morning and at bedtime. Take with or immediately following a meal. 180 tablet 3   minoxidil (LONITEN) 2.5 MG tablet Take 2.5 mg by mouth daily.     montelukast (SINGULAIR) 10 MG tablet Take 10 mg by mouth every morning.     MOUNJARO 5 MG/0.5ML Pen Inject 5 mg into the skin once a week. On Wednesdays     omeprazole (PRILOSEC) 40 MG capsule Take 1 capsule (40 mg total) by mouth daily before breakfast. 30 capsule 0   potassium chloride SA (KLOR-CON M) 20 MEQ tablet Take 1 tablet (20 mEq total) by mouth daily. 90 tablet 3   sacubitril-valsartan (ENTRESTO) 97-103 MG Take 1 tablet by mouth 2 (two) times daily. 180 tablet 3   spironolactone (ALDACTONE) 25 MG tablet Take 1 tablet (25 mg total) by mouth daily. 90 tablet 3   SYMBICORT 160-4.5 MCG/ACT inhaler Inhale 2 puffs into the lungs daily as needed (for respiratory issues.).      traZODone (DESYREL) 50 MG tablet Take 50 mg by mouth at bedtime.     valACYclovir (VALTREX) 500 MG tablet Take 500 mg by mouth 2 (two) times daily as needed (for cold sores.).     No current facility-administered medications for  this encounter.   Wt Readings from Last 3 Encounters:  07/25/23 102.2 kg (225 lb 6.4 oz)  07/04/23 103.1 kg (227 lb 6.4 oz)  11/01/22 95.9 kg (211 lb 6.4 oz)    BP (!) 158/98   Pulse 100   Wt 102.2 kg (225 lb 6.4 oz)   LMP 08/10/2019 (Exact Date)   SpO2 98%   BMI 41.23 kg/m  General:   No resp difficulty Neck: supple. no JVD.  Cor: PMI nondisplaced. Regular rate & rhythm. No rubs, gallops  or murmurs. Lungs: clear Abdomen: soft, nontender, nondistended.  Extremities: no cyanosis, clubbing, rash, edema Neuro: alert & oriented x3  Assessment/Plan: 1. Chronic systolic CHF: Nonischemic cardiomyopathy, cath showed no significant coronary disease at time of diagnosis.  Etiology uncertain, possible prior viral myocarditis.  CMRI in 12/14 with no LGE noted.  No heavy drinking or drugs.  No history of familial cardiomyopathy.  Echo in 2/21 showed stable EF 40-45%.  Echo in 3/23 with EF lower at 30-35%, mild-moderate LV dilation, normal RV.  Echo in 12/23 showed EF 25-30%, global hypokinesis, LV mildly dilated, mildly decreased RV systolic function, IVC normal. Echo today showed EF mildly higher, back to 30-35%.  - EF is < 35% but will not refer for ICD yet. Though she is young, she has NICM without significant scarring on cMRI.   If EF < 35% at that point, she is willing to get ICD.  Not CRT candidate with narrow QRS.  06/2023 Echo EF 25-30%. Referred to EP for ICD.   - NYHA III. ReDs reading: 42 %, abnormal.  - Volume status is elevated. Will need to switch lasix from as needed to lasix 40 mg daily.  Discussed low salt food choices and limiting fluid intake to < 2 liters.  - Continue spironolactone 25 mg daily.   - Continue Toprol XL 100 mg bid.   - Continue Bidil 2 tabs tid.   - Continue Entresto 97/103 bid.    - Continue dapagliflozin 10 mg daily.  - Check BMET and BNP.  2. Rheumatoid arthritis: She sees a rheumatologist, now on Enbrel.  3. Obesity: Body mass index is 41.23 kg/m. - Back  on Mounjaro and starting to lose weight again.  - Dicussed portion control.  4. Fatigue Check TSH, CBC, Anemia panel.  ? Possible related to heavy menstrual cycle. May need to give IV Iron.    Follow up in 6 weeks.  If fatigue persists I have asked her to call back. May need RHC.    Bench Caraveo NP-C  07/25/2023

## 2023-07-25 NOTE — Patient Instructions (Addendum)
 RedsClip done today.   Labs done today. We will contact you only if your labs are abnormal.  START Lasix 40mg  (1 tablet) by mouth daily.   No other medication changes were made. Please continue all current medications as prescribed.  Your physician recommends that you schedule a follow-up appointment in: 3 weeks with our NP/PA clinic here in our office   If you have any questions or concerns before your next appointment please send us  a message through [place] or call our office at [phone].    TO LEAVE A MESSAGE FOR THE NURSE SELECT OPTION 2, PLEASE LEAVE A MESSAGE INCLUDING: YOUR NAME DATE OF BIRTH CALL BACK NUMBER REASON FOR CALL**this is important as we prioritize the call backs  YOU WILL RECEIVE A CALL BACK THE SAME DAY AS LONG AS YOU CALL BEFORE 4:00 PM   Do the following things EVERYDAY: Weigh yourself in the morning before breakfast. Write it down and keep it in a log. Take your medicines as prescribed Eat low salt foods--Limit salt (sodium) to 2000 mg per day.  Stay as active as you can everyday Limit all fluids for the day to less than 2 liters   At the [facility], you and your health needs are our priority. As part of our continuing mission to provide you with exceptional heart care, we have created designated Provider Care Teams. These Care Teams include your primary Cardiologist (physician) and Advanced Practice Providers (APPs- Physician Assistants and Nurse Practitioners) who all work together to provide you with the care you need, when you need it.   You may see any of the following providers on your designated Care Team at your next follow up: Dr Jules Oar Dr Peder Bourdon Dr. Mimi Alt, NP Ruddy Corral, [place] [facility] [place], [place] Dennise Fitz, NP Luster Salters, PharmD   Please be sure to bring in all your medications bottles to every appointment.    Thank you for choosing [facility]

## 2023-07-25 NOTE — Telephone Encounter (Signed)
 Received call from patient who states that she has been feeling very weak and fatigued over the last few weeks/months. Patient reports that she has also been having swelling in her lower extremities. Patient reports that she took 3 40mg  tablets of furosemide on Saturday and 2 40mg  tablets yesterday to help with this. Patient reports that she is tired of feeling this way and is unsure what is calling this. Scheduled patient to see NP today in clinic at 2pm. Patient aware of appointment time and date and will have someone bring her.   Advised patient to call back to office with any issues, questions, or concerns. Patient verbalized understanding.

## 2023-07-25 NOTE — Progress Notes (Signed)
 ReDS Vest / Clip - 07/25/23 1400       ReDS Vest / Clip   Station Marker B    Ruler Value 33.5    ReDS Value Range High volume overload    ReDS Actual Value 42

## 2023-08-02 ENCOUNTER — Institutional Professional Consult (permissible substitution): Admitting: Cardiology

## 2023-08-03 ENCOUNTER — Telehealth (HOSPITAL_COMMUNITY): Payer: Self-pay | Admitting: *Deleted

## 2023-08-03 NOTE — Telephone Encounter (Signed)
 Pt left VM on triage to ask about las  Attempted to return call however n/a and VM is full

## 2023-08-05 ENCOUNTER — Encounter (HOSPITAL_COMMUNITY): Payer: Self-pay | Admitting: *Deleted

## 2023-08-05 ENCOUNTER — Telehealth (HOSPITAL_COMMUNITY): Payer: Self-pay | Admitting: Cardiology

## 2023-08-05 NOTE — Telephone Encounter (Signed)
 Patient called to discuss options for declining EF, reports EF has been declining over the years. Would like to knowhow low does it have to get before discussions are started for transplant   Advised of general advanced therapy referrals, will need to discuss at upcoming appt with provider  Appt notes updated and patient appreciative of returned call.

## 2023-08-05 NOTE — Telephone Encounter (Signed)
 Pt dropped off jury summons at front desk, requesting an excuse letter. Letter completed and signed by Dr Mitzie Anda, attempted to call pt and lm that letter was complete and at front desk for her to pick up, mychart mess sent too

## 2023-08-07 NOTE — Progress Notes (Unsigned)
  Electrophysiology Office Note:   Date:  08/08/2023  ID:  [name], DOB [birth year], MRN [id number]  Primary Cardiologist: Antoinette Batman, MD Primary Heart Failure: Peder Bourdon, MD Electrophysiologist: Lei Pump, MD  Issue  History of Present Illness:   [name] is a 53 y.o. female with h/o chronic systolic heart failure, rheumatoid arthritis, type 2 diabetes, hypertension seen today for  for Electrophysiology evaluation of chronic systolic heart failure at the request of Peder Bourdon.    She has had a cardiomyopathy for a number of years.  Ejection fraction 25 to 30%.  Today, denies symptoms of palpitations, chest pain, shortness of breath, orthopnea, PND, lower extremity edema, claudication, dizziness, presyncope, syncope, bleeding, or neurologic sequela. The patient is tolerating medications without difficulties.  She has mild fatigue and shortness of breath.  Despite this, she continues to work.  She is without acute complaint.  Review of systems complete and found to be negative unless listed in HPI.   EP Information / Studies Reviewed:    EKG is not ordered today. EKG from 07/04/23 reviewed which showed sinus rhythm        Risk Assessment/Calculations:              Physical Exam:   VS:  BP 126/80 (BP Location: Left Arm, Patient Position: Sitting, Cuff Size: Large)   Pulse 94   Ht 5\' 2"  (1.575 m)   Wt 220 lb (99.8 kg)   LMP 08/10/2019 (Exact Date)   SpO2 97%   BMI 40.24 kg/m    Wt Readings from Last 3 Encounters:  08/08/23 220 lb (99.8 kg)  07/25/23 225 lb 6.4 oz (102.2 kg)  07/04/23 227 lb 6.4 oz (103.1 kg)     GEN: Well nourished, well developed in no acute distress NECK: No JVD; No carotid bruits CARDIAC: Regular rate and rhythm, no murmurs, rubs, gallops RESPIRATORY:  Clear to auscultation without rales, wheezing or rhonchi  ABDOMEN: Soft, non-tender, non-distended EXTREMITIES:  No edema; No deformity   ASSESSMENT AND PLAN:     1.  Chronic systolic heart failure: Due to nonischemic cardiomyopathy.  Ejection fraction 25 to 30%.  She is on optimal medical therapy per heart failure cardiology.  Her ejection fraction has been reduced for quite some time.  She would benefit from ICD therapy.  QRS is narrow.  Zelda Reames plan for ICD for primary prevention.  Risk and benefits have been discussed.  She understands the risks and is agreed to the procedure.  Explained risks, benefits, and alternatives to ICD implantation, including but not limited to bleeding, infection, pneumothorax, pericardial effusion, lead dislodgement, heart attack, stroke, or death.  Pt verbalized understanding and agrees to proceed.  2.  Obesity: Lifestyle modification encouraged.  On Mounjaro.  3.  Rheumatoid arthritis: On Enbrel  Case discussed with primary cardiology  Follow up with Dr. Lawana Pray as usual post procedure  Signed, Asanti Craigo Cortland Ding, MD

## 2023-08-08 ENCOUNTER — Encounter: Payer: Self-pay | Admitting: Cardiology

## 2023-08-08 ENCOUNTER — Ambulatory Visit: Attending: Cardiology | Admitting: Cardiology

## 2023-08-08 VITALS — BP 126/80 | HR 94 | Ht 62.0 in | Wt 220.0 lb

## 2023-08-08 DIAGNOSIS — I5022 Chronic systolic (congestive) heart failure: Secondary | ICD-10-CM

## 2023-08-08 NOTE — Patient Instructions (Addendum)
 Medication Instructions:  Your physician recommends that you continue on your current medications as directed. Please refer to the Current Medication list given to you today.  *If you need a refill on your cardiac medications before your next appointment, please call your pharmacy*   Lab Work: None ordered If you have labs (blood work) drawn today and your tests are completely normal, you will receive your results only by: MyChart Message (if you have MyChart) OR A paper copy in the mail If you have any lab test that is abnormal or we need to change your treatment, we will call you to review the results.   Testing/Procedures: Your physician has recommended that you have a defibrillator inserted. An implantable cardioverter defibrillator (ICD) is a small device that is placed in your chest or, in rare cases, your abdomen. This device uses electrical pulses or shocks to help control life-threatening, irregular heartbeats that could lead the heart to suddenly stop beating (sudden cardiac arrest). Leads are attached to the ICD that goes into your heart. This is done in the hospital and usually requires an overnight stay.   You are scheduled for 10/28/23, arrive to [facility] at 1:00 pm.  Someone will send instructions for this procedure via mychart.    Follow-Up: At [facility], you and your health needs are our priority.  As part of our continuing mission to provide you with exceptional heart care, we have created designated Provider Care Teams.  These Care Teams include your primary Cardiologist (physician) and Advanced Practice Providers (APPs -  Physician Assistants and Nurse Practitioners) who all work together to provide you with the care you need, when you need it.  Your next appointment:   2 week(s) after implant   The format for your next appointment:   In Person  Provider:   Device clinic for a wound check     Thank you for choosing [facility]!!   Reece Cane, RN [phone]  Other Instructions  Cardioverter Defibrillator Implantation An implantable cardioverter defibrillator (ICD) is a device that detects abnormal heart rhythms, also called arrhythmias. When the ICD senses a heart rhythm that's not normal, it sends an electrical signal to get the heartbeat back into a normal rhythm. In the implantation surgery, the ICD is placed under the skin in your chest or abdomen. An ICD has a battery and a small computer called a pulse generator. It also has wires, called leads, that go into the heart. The ICD detects and corrects two types of dangerous heart rhythms: Ventricular tachycardia. This is a very fast heart rhythm in the lower chambers of the heart. These chambers are called the ventricles. Ventricular fibrillation. This is when the ventricles contract in an uncoordinated way. Your health care provider may suggest an ICD if: You had an abnormal heart rhythm that started in the ventricles. Your heart has damage from a disease or heart condition. Your heart muscle is weak. You had a cardiac arrest before. You have: A congenital heart defect. This is a heart problem you were born with. You have other health problems that can affect your heart's electrical system. Tell a health care provider about: Any allergies you have. All medicines you're taking. These include vitamins, herbs, eye drops, creams, and over-the-counter medicines. Any problems you or family members have had with anesthesia. Any bleeding problems you have. Any surgeries you have had. Any medical conditions you have. Whether you're pregnant or may be pregnant. What are the risks? Your provider  will talk with you about risks. These may include: Infection. Bleeding. Allergic reactions to medicines. Blood clots. Swelling or bruising. Damage to nearby structures or organs. These might be nerves, lungs, blood vessels, or the heart where parts of the ICD are placed. What  happens before the procedure? When to stop eating and drinking Follow instructions from your provider about what you may eat and drink. These may include: 8 hours before your procedure Stop eating most foods. Do not eat meat, fried foods, or fatty foods. Eat only light foods, such as toast or crackers. All liquids are okay except energy drinks and alcohol. 6 hours before your procedure Stop eating. Drink only clear liquids, such as water, clear fruit juice, black coffee, plain tea, and sports drinks. Do not drink energy drinks or alcohol. 2 hours before your procedure Stop drinking all liquids. You may be allowed to take medicines with small sips of water. If you don't follow your provider's instructions, your procedure may be delayed or canceled. Medicines Ask your provider about: Changing or stopping your regular medicines. These include any diabetes medicines or blood thinners you take. Taking medicines such as aspirin  and ibuprofen . These medicines can thin your blood. Do not take them unless your provider tells you to. Taking over-the-counter medicines, vitamins, herbs, and supplements. Tests You may have an exam or testing. These may include: Blood tests. Electrocardiogram (ECG). This test records the electrical signals in your heart. Imaging tests, such as a chest X-ray. Echocardiogram. This uses sound waves to make pictures of your heart. An event monitor or Holter monitor to wear at home. These track your heart rhythm. General instructions Do not use any products that contain nicotine or tobacco for at least 4 weeks before the procedure. These products include cigarettes, chewing tobacco, and vaping devices, such as e-cigarettes. If you need help quitting, ask your provider. Ask your provider: How your procedure site will be marked. What steps will be taken to help prevent infection. These may include: Removing hair at the surgery site. Washing skin with a soap that kills  germs. Taking antibiotics. If you'll be going home right after the procedure, plan to have a responsible adult: Take you home from the hospital or clinic. You won't be allowed to drive. Care for you for the time you are told. What happens during the procedure?  Monitors will be put on your body. They will be used to check your heart rate, blood pressure, and oxygen level. A pair of sticky pads, called defibrillator pads, may be placed on your back and chest. These pads can pace your heart as needed during the procedure. An IV will be inserted into one of your veins. You may be given: A sedative. This helps you relax. Anesthesia. This keeps you from feeling pain. It will make you fall asleep for surgery. A small incision will be made to create a deep pocket under the skin of your chest or abdomen. Leads will be guided through a blood vessel into your heart and attached to your heart muscles. An X-ray machine called a fluoroscope will be used to help guide the leads. Depending on the ICD, the leads may go into one ventricle, or they may go into both ventricles and into an upper chamber of the heart. The other end of the leads will be attached to the ICD's pulse generator. The pulse generator will be placed into the pocket under your skin. The ICD will be tested, and your provider will program the ICD  for the condition being treated. The incision will be closed with stitches, skin glue, tape strips, or staples. A bandage will be placed over the incision. The procedure may vary among providers and hospitals. What happens after the procedure? Your blood pressure, heart rate, breathing rate, and blood oxygen level will be monitored until you leave the hospital or clinic. A chest X-ray will be done to check the ICD. Do not raise your arm higher than your shoulder for as long as told. This is usually at least 6 weeks. You may be given an ID card that shows you have an ICD. You'll be given a remote  home monitoring device to use with your ICD. It allows your device to communicate with your provider. Do not drive until your provider says it's safe. This information is not intended to replace advice given to you by your health care provider. Make sure you discuss any questions you have with your health care provider. Document Revised: 06/17/2022 Document Reviewed: 06/17/2022 Elsevier Patient Education  2024 [employer].

## 2023-08-12 ENCOUNTER — Telehealth (HOSPITAL_COMMUNITY): Payer: Self-pay | Admitting: Cardiology

## 2023-08-12 DIAGNOSIS — I5022 Chronic systolic (congestive) heart failure: Secondary | ICD-10-CM

## 2023-08-12 NOTE — Telephone Encounter (Signed)
 Patient called to report increase in palps  (5-6 events 5/1) Reports she was at work and events occurred while at work no changes to daily activity,diet or stressors   Denies SOB, dizziness, CP Weight stable No recent medication changes reports medication compliance   Seen by EP 4/28 plan for device implant soon 10/28/23   Please advise

## 2023-08-16 ENCOUNTER — Inpatient Hospital Stay (HOSPITAL_COMMUNITY)
Admission: RE | Admit: 2023-08-16 | Discharge: 2023-08-16 | Disposition: A | Discharge status: Home / Self Care | Source: Ambulatory Visit | Attending: Cardiology | Admitting: Cardiology

## 2023-08-16 ENCOUNTER — Ambulatory Visit (HOSPITAL_COMMUNITY)
Admission: RE | Admit: 2023-08-16 | Discharge: 2023-08-16 | Disposition: A | Discharge status: Home / Self Care | Source: Ambulatory Visit | Attending: Internal Medicine | Admitting: Internal Medicine

## 2023-08-16 DIAGNOSIS — R002 Palpitations: Secondary | ICD-10-CM | POA: Insufficient documentation

## 2023-08-16 MED ORDER — POTASSIUM CHLORIDE CRYS ER 10 MEQ PO TBCR: 40.0000 meq | EXTENDED_RELEASE_TABLET | Freq: Every day | ORAL | 6 refills | Status: DC

## 2023-08-16 NOTE — Progress Notes (Signed)
 Zio placed as ordered by Vernia Good NP x 14 days Palps  Zio patch placed onto patient.  All instructions and information reviewed with patient, they verbalize understanding with no questions.

## 2023-08-16 NOTE — Patient Instructions (Signed)
 INCREASE Potassium to 40 meq daily  Labs needed in 7-10 days  Your provider has recommended that  you wear a Zio Patch for 14 days.  This monitor will record your heart rhythm for our review.  IF you have any symptoms while wearing the monitor please press the button.  If you have any issues with the patch or you notice a red or orange light on it please call the company at [phone].  Once you remove the patch please mail it back to the company as soon as possible so we can get the results.   Keep additional follow up appointments as scheduled   Do the following things EVERYDAY: Weigh yourself in the morning before breakfast. Write it down and keep it in a log. Take your medicines as prescribed Eat low salt foods--Limit salt (sodium) to 2000 mg per day.  Stay as active as you can everyday Limit all fluids for the day to less than 2 liters  At [facility], you and your health needs are our priority. As part of our continuing mission to provide you with exceptional heart care, we have created designated Provider Care Teams. These Care Teams include your primary Cardiologist (physician) and Advanced Practice Providers (APPs- Physician Assistants and Nurse Practitioners) who all work together to provide you with the care you need, when you need it.   You may see any of the following providers on your designated Care Team at your next follow up: Dr Jules Oar Dr Peder Bourdon Dr. Alwin Baars Dr. Arta Lark Amy Marijane Shoulders, NP Ruddy Corral, [place] [facility] [place], [place] Dennise Fitz, NP [place] Lee, NP Shawnee Dellen, NP Luster Salters, PharmD Bevely Brush, PharmD   Please be sure to bring in all your medications bottles to every appointment.    Thank you for choosing [facility]

## 2023-08-16 NOTE — Telephone Encounter (Signed)
Pt aware.

## 2023-08-18 DIAGNOSIS — M79661 Pain in right lower leg: Secondary | ICD-10-CM | POA: Diagnosis not present

## 2023-08-18 DIAGNOSIS — M549 Dorsalgia, unspecified: Secondary | ICD-10-CM | POA: Diagnosis not present

## 2023-08-24 ENCOUNTER — Other Ambulatory Visit (HOSPITAL_COMMUNITY)

## 2023-08-29 ENCOUNTER — Ambulatory Visit (HOSPITAL_COMMUNITY): Payer: Self-pay | Admitting: Cardiology

## 2023-08-29 ENCOUNTER — Encounter (HOSPITAL_COMMUNITY): Payer: Self-pay | Admitting: Cardiology

## 2023-08-29 ENCOUNTER — Ambulatory Visit (HOSPITAL_COMMUNITY)
Admission: RE | Admit: 2023-08-29 | Discharge: 2023-08-29 | Disposition: A | Discharge status: Home / Self Care | Source: Ambulatory Visit | Attending: Cardiology | Admitting: Cardiology

## 2023-08-29 VITALS — BP 100/60 | HR 75 | Wt 227.0 lb

## 2023-08-29 DIAGNOSIS — Z6841 Body Mass Index (BMI) 40.0 and over, adult: Secondary | ICD-10-CM | POA: Diagnosis not present

## 2023-08-29 DIAGNOSIS — E669 Obesity, unspecified: Secondary | ICD-10-CM | POA: Diagnosis not present

## 2023-08-29 DIAGNOSIS — I428 Other cardiomyopathies: Secondary | ICD-10-CM | POA: Insufficient documentation

## 2023-08-29 DIAGNOSIS — M069 Rheumatoid arthritis, unspecified: Secondary | ICD-10-CM | POA: Insufficient documentation

## 2023-08-29 DIAGNOSIS — Z8249 Family history of ischemic heart disease and other diseases of the circulatory system: Secondary | ICD-10-CM | POA: Insufficient documentation

## 2023-08-29 DIAGNOSIS — I5022 Chronic systolic (congestive) heart failure: Secondary | ICD-10-CM | POA: Insufficient documentation

## 2023-08-29 DIAGNOSIS — R002 Palpitations: Secondary | ICD-10-CM | POA: Diagnosis not present

## 2023-08-29 DIAGNOSIS — Z79899 Other long term (current) drug therapy: Secondary | ICD-10-CM | POA: Diagnosis not present

## 2023-08-29 DIAGNOSIS — I110 Hypertensive heart disease with heart failure: Secondary | ICD-10-CM | POA: Diagnosis not present

## 2023-08-29 DIAGNOSIS — R9431 Abnormal electrocardiogram [ECG] [EKG]: Secondary | ICD-10-CM | POA: Insufficient documentation

## 2023-08-29 LAB — BASIC METABOLIC PANEL WITH GFR
Anion gap: 8 (ref 5–15)
BUN: 13 mg/dL (ref 6–20)
CO2: 25 mmol/L (ref 22–32)
Calcium: 8.9 mg/dL (ref 8.9–10.3)
Chloride: 106 mmol/L (ref 98–111)
Creatinine, Ser: 0.73 mg/dL (ref 0.44–1.00)
GFR, Estimated: >60 mL/min (ref >60)
Glucose, Bld: 126 mg/dL — ABNORMAL HIGH (ref 70–99)
Potassium: 3.9 mmol/L (ref 3.5–5.1)
Sodium: 139 mmol/L (ref 135–145)

## 2023-08-29 LAB — BRAIN NATRIURETIC PEPTIDE: B Natriuretic Peptide: 149.8 pg/mL — ABNORMAL HIGH (ref 0.0–100.0)

## 2023-08-29 NOTE — Progress Notes (Signed)
 PCP: Dr. Sabra Cramp Cardiology: Dr. Abel Hoe HF Cardiology: Dr. Mitzie Anda  Chief complaint: CHF  53 y.o. with history of nonischemic cardiomyopathy/chronic systolic CHF and rheumatoid arthritis.   Patient has had a cardiomyopathy known for a number of years now.  Coronary angiography in 2012 showed no significant disease.  EF has been in the 25-30% range long-term.  Most recent echo in 8/18 showed EF remains 25-30%.  She has no family history of cardiomyopathy and she has never been a heavy drinker.  She has not used illicit drugs.  RHC in 10/18 showed preserved cardiac output with mildly increased filling pressures.  Lasix  was increased to 120 qam/80 qpm.  She was seen by EP (Camnitz) and decided against ICD.    Echo in 3/23 showed EF lower at 30-35%, mild-moderate LV dilation, normal RV.  Echo in 12/23 showed EF 25-30%, global hypokinesis, LV mildly dilated, mildly decreased RV systolic function, IVC normal. Echo in 5/24 showed EF 30-35%, normal RV, normal IVC.    06/2023 Echo EF 25-30%. She was referred to EP for ICD, plan for placement in 7/25.     She returns for HF follow up. She is currently wearing a Zio monitor due to palpitations.  Still has occasional palpitations, no lightheadedness or syncope.  She is off Farxiga  because of multiple yeast infections. No dyspnea walking on flat ground.  No problems with 1 flight of stairs.  No chest pain. She uses 2 pillows at night, no PND.  RA is currently well-treated, minimal joint pain.  She continues to have some generalized fatigue.   ECG (personally reviewed): NSR, LVH with repolarization abnormality.   Labs (4/25): BNP 160, K 3.1, creatinine 0.76  PMH: 1. Rheumatoid arthritis: Followed by Dr. Marylin So.  2. Type II diabetes.  3. HTN 4. Chronic systolic CHF: Nonischemic cardiomyopathy.  - LHC (2012): No significant CAD.  - Echo (7/13): EF 30-35% - Cardiac MRI (12/14): Mild LV dilation, EF 40%, normal RV size and systolic function, no late gadolinium  enhancement.  - Echo (7/16): EF 25-30% - Echo (8/18): EF 25-30% - RHC (10/18): mean RA 11, PA 47/20 mean 32, mean PCWP 16, CI 3.06 Fick/3.5 thermo, PVR 2.65 WU.  - Echo (10/19): EF 40-45%, diffuse hypokinesis, moderate diastolic dysfunction, normal RV size and systolic function  - Echo (2/21): EF 40-45%, diffuse hypokinesis.  - Echo (2/23): EF lower at 30-35%, mild-moderate LV dilation, normal RV.  - Echo (12/23): EF 25-30%, global hypokinesis, LV mildly dilated, mildly decreased RV systolic function, IVC normal.  - Echo (5/24): EF 30-35%, normal RV, normal IVC. - Echo (3/25):  EF 25-30%. Referred to EP for ICD.   SH: Lives alone.  Rare ETOH, no drugs.  No smoking.   Family History  Problem Relation Age of Onset   Heart attack Mother    CAD Mother    Allergies Mother    Breast cancer Mother    Hypertension Other    Diabetes Mellitus II Other    ROS: All systems reviewed and negative except as per HPI.   Current Outpatient Medications  Medication Sig Dispense Refill   albuterol  (PROVENTIL ) (2.5 MG/3ML) 0.083% nebulizer solution Take 2.5 mg by nebulization every 6 (six) hours as needed for wheezing or shortness of breath.     albuterol  (VENTOLIN  HFA) 108 (90 Base) MCG/ACT inhaler Inhale 2 puffs into the lungs every 6 (six) hours as needed for wheezing or shortness of breath.     benzonatate  (TESSALON ) 100 MG capsule Take 1 capsule (  100 mg total) by mouth as needed for cough. 20 capsule 1   betamethasone dipropionate (DIPROLENE) 0.05 % ointment As needed     cholecalciferol (VITAMIN D3) 25 MCG (1000 UNIT) tablet Take 25 mcg by mouth daily.     cyclobenzaprine  (FLEXERIL ) 10 MG tablet Take 1 tablet (10 mg total) by mouth at bedtime as needed for muscle spasms. 8 tablet 0   ENBREL 50 MG/ML injection Inject 50 mg into the skin once a week.     Fluocinolone Acetonide Scalp 0.01 % OIL daily as needed.     fluticasone (FLONASE) 50 MCG/ACT nasal spray Place 2 sprays into both nostrils daily as  needed.     folic acid  (FOLVITE ) 1 MG tablet Take 1 mg by mouth daily.     furosemide  (LASIX ) 40 MG tablet Take 1 tablet (40 mg total) by mouth daily. 90 tablet 3   gabapentin (NEURONTIN) 600 MG tablet Take 600 mg by mouth daily as needed.     isosorbide -hydrALAZINE  (BIDIL ) 20-37.5 MG tablet Take 2 tablets by mouth 3 (three) times daily. 540 tablet 3   levocetirizine (XYZAL ) 5 MG tablet Take 5 mg by mouth at bedtime.     meloxicam (MOBIC) 15 MG tablet Take 15 mg by mouth daily as needed.     metoprolol  succinate (TOPROL -XL) 100 MG 24 hr tablet Take 1 tablet (100 mg total) by mouth in the morning and at bedtime. Take with or immediately following a meal. 180 tablet 3   minoxidil (LONITEN) 2.5 MG tablet Take 2.5 mg by mouth daily.     montelukast (SINGULAIR) 10 MG tablet Take 10 mg by mouth every morning.     MOUNJARO 5 MG/0.5ML Pen Inject 5 mg into the skin once a week. On Wednesdays     omeprazole  (PRILOSEC) 40 MG capsule Take 1 capsule (40 mg total) by mouth daily before breakfast. 30 capsule 0   potassium chloride  SA (KLOR-CON  M) 10 MEQ tablet Take 4 tablets (40 mEq total) by mouth daily. 120 tablet 6   sacubitril -valsartan  (ENTRESTO ) 97-103 MG Take 1 tablet by mouth 2 (two) times daily. 180 tablet 3   spironolactone  (ALDACTONE ) 25 MG tablet Take 1 tablet (25 mg total) by mouth daily. 90 tablet 3   SYMBICORT  160-4.5 MCG/ACT inhaler Inhale 2 puffs into the lungs daily as needed (for respiratory issues.).      traZODone (DESYREL) 50 MG tablet Take 50 mg by mouth at bedtime.     valACYclovir  (VALTREX ) 500 MG tablet Take 500 mg by mouth 2 (two) times daily as needed (for cold sores.).     fluconazole  (DIFLUCAN ) 150 MG tablet Take 1 tablet (150 mg total) by mouth daily. (Patient not taking: Reported on 08/29/2023) 1 tablet 0   No current facility-administered medications for this encounter.   Wt Readings from Last 3 Encounters:  08/29/23 103 kg (227 lb)  08/08/23 99.8 kg (220 lb)  07/25/23 102.2  kg (225 lb 6.4 oz)    BP 100/60   Pulse 75   Wt 103 kg (227 lb)   LMP 08/10/2019 (Exact Date)   SpO2 97%   BMI 41.52 kg/m  General: NAD Neck: No JVD, no thyromegaly or thyroid  nodule.  Lungs: Clear to auscultation bilaterally with normal respiratory effort. CV: Nondisplaced PMI.  Heart regular S1/S2, no S3/S4, no murmur.  No peripheral edema.  No carotid bruit.  Normal pedal pulses.  Abdomen: Soft, nontender, no hepatosplenomegaly, no distention.  Skin: Intact without lesions or rashes.  Neurologic:  Alert and oriented x 3.  Psych: Normal affect. Extremities: No clubbing or cyanosis.  HEENT: Normal.   Assessment/Plan: 1. Chronic systolic CHF: Nonischemic cardiomyopathy, cath showed no significant coronary disease at time of diagnosis.  Etiology uncertain, possible prior viral myocarditis.  CMRI in 12/14 with no LGE noted.  No heavy drinking or drugs.  No history of familial cardiomyopathy.  Echo in 2/21 showed stable EF 40-45%.  Echo in 3/23 with EF lower at 30-35%, mild-moderate LV dilation, normal RV.  Echo in 12/23 showed EF 25-30%, global hypokinesis, LV mildly dilated, mildly decreased RV systolic function, IVC normal. Echo in 3/25 with EF 25-30%. NYHA class II, not volume overloaded on exam.  - She will be getting an ICD in 7/25.  Narrow QRS so not CRT candidate.    - Continue Lasix  40 mg daily. BMET/BNP today.  - Continue spironolactone  25 mg daily.   - Continue Toprol  XL 100 mg bid.   - Continue Bidil  2 tabs tid.   - Continue Entresto  97/103 bid.    - She is off Farxiga  due to multiple yeast infections.  2. Rheumatoid arthritis: She sees a rheumatologist, now on Enbrel.  3. Obesity: Body mass index is 41.52 kg/m. - Continue Mounjaro 4. Palpitations: No worrisome lightheadedness or syncope. She is wearing a Zio monitor, await results.   Follow up in 4 months with APP.   I spent 31 minutes reviewing records, interviewing/examining patient, and managing orders.    Peder Bourdon  08/29/2023

## 2023-08-29 NOTE — Patient Instructions (Signed)
 Good to see you today!  No medication changes   Labs done today, your results will be available in MyChart, we will contact you for abnormal readings.  Your physician recommends that you schedule a follow-up appointment in: 4 months with app clinic  If you have any questions or concerns before your next appointment please send us  a message through [place] or call our office at [phone].    TO LEAVE A MESSAGE FOR THE NURSE SELECT OPTION 2, PLEASE LEAVE A MESSAGE INCLUDING: YOUR NAME DATE OF BIRTH CALL BACK NUMBER REASON FOR CALL**this is important as we prioritize the call backs  YOU WILL RECEIVE A CALL BACK THE SAME DAY AS LONG AS YOU CALL BEFORE 4:00 PM  At the [facility], you and your health needs are our priority. As part of our continuing mission to provide you with exceptional heart care, we have created designated Provider Care Teams. These Care Teams include your primary Cardiologist (physician) and Advanced Practice Providers (APPs- Physician Assistants and Nurse Practitioners) who all work together to provide you with the care you need, when you need it.   You may see any of the following providers on your designated Care Team at your next follow up: Dr Jules Oar Dr Peder Bourdon Dr. Alwin Baars Dr. Arta Lark Amy Marijane Shoulders, NP Ruddy Corral, [place] [facility] [place], [place] Dennise Fitz, NP [place] Lee, NP Shawnee Dellen, NP Luster Salters, PharmD Bevely Brush, PharmD   Please be sure to bring in all your medications bottles to every appointment.    Thank you for choosing [facility]

## 2023-09-01 ENCOUNTER — Telehealth: Payer: Self-pay

## 2023-09-01 DIAGNOSIS — I5022 Chronic systolic (congestive) heart failure: Secondary | ICD-10-CM

## 2023-09-01 NOTE — Telephone Encounter (Signed)
 Patient is scheduled for ICD implant with Dr, Almetta Armor on 10/28/23 @ 3pm.  Patient is scheduled to have labs drawn between 7/1-7/3 and will be going up to 5th floor after to pick up surgical scrub.   Labs have been ordered and released.   Procedure instructions have been sent via MyChart per patient request.   Pre-cert message sent.

## 2023-09-19 DIAGNOSIS — R002 Palpitations: Secondary | ICD-10-CM | POA: Diagnosis not present

## 2023-09-19 NOTE — Addendum Note (Signed)
 Encounter addended by: Stan Eans, RN on: 09/19/2023 2:20 PM  Actions taken: Imaging Exam ended

## 2023-09-20 ENCOUNTER — Telehealth: Payer: Self-pay | Admitting: Cardiovascular Disease

## 2023-09-20 ENCOUNTER — Ambulatory Visit (HOSPITAL_COMMUNITY): Payer: Self-pay | Admitting: Cardiology

## 2023-09-20 DIAGNOSIS — Z0279 Encounter for issue of other medical certificate: Secondary | ICD-10-CM

## 2023-09-20 NOTE — Telephone Encounter (Signed)
 Forms and payment were received by Johana and given to Leanne on 09/20/2023 from patient. FMLA

## 2023-09-20 NOTE — Telephone Encounter (Signed)
 Patient brought in a disability/FMLA form from employer, [employer].  She signed the release of information and paid the $29 fee.  Form is in Dr. Adell Age box.

## 2023-09-22 DIAGNOSIS — M2550 Pain in unspecified joint: Secondary | ICD-10-CM | POA: Diagnosis not present

## 2023-09-22 DIAGNOSIS — Z79899 Other long term (current) drug therapy: Secondary | ICD-10-CM | POA: Diagnosis not present

## 2023-09-22 DIAGNOSIS — M0600 Rheumatoid arthritis without rheumatoid factor, unspecified site: Secondary | ICD-10-CM | POA: Diagnosis not present

## 2023-09-22 NOTE — Telephone Encounter (Signed)
 Forms completed, placed in [employer] box.

## 2023-09-23 DIAGNOSIS — M545 Low back pain, unspecified: Secondary | ICD-10-CM | POA: Diagnosis not present

## 2023-09-28 ENCOUNTER — Telehealth (HOSPITAL_COMMUNITY): Payer: Self-pay | Admitting: Cardiology

## 2023-09-28 NOTE — Telephone Encounter (Signed)
 While reviewing zio results pt reported concerned of severe edema Reports edema is present in hands, feet, and legs.  Does not weigh daily Reports no SOB Reports a few events of flutters and dizziness. Reports they go away after a min or so  Compliant with meds daily Reports compliance with fluid restrictions however unclear if sodium restrictions are followed

## 2023-09-28 NOTE — Telephone Encounter (Signed)
 [employer] disability form scanned to [employer] and scanned to chart. Billing  notified.

## 2023-10-03 DIAGNOSIS — M5136 Other intervertebral disc degeneration, lumbar region with discogenic back pain only: Secondary | ICD-10-CM | POA: Diagnosis not present

## 2023-10-05 DIAGNOSIS — M47816 Spondylosis without myelopathy or radiculopathy, lumbar region: Secondary | ICD-10-CM | POA: Diagnosis not present

## 2023-10-10 DIAGNOSIS — M47816 Spondylosis without myelopathy or radiculopathy, lumbar region: Secondary | ICD-10-CM | POA: Diagnosis not present

## 2023-10-11 ENCOUNTER — Telehealth: Payer: Self-pay

## 2023-10-11 NOTE — Telephone Encounter (Signed)
 Called pt to make sure she remembered to get labs done prior to her ICD Implant with Dr. Inocencio on 7/15. She is coming to our office tomorrow (7/2) to bring paperwork and will have labs done at that time.   I have left updated Instruction letter at front desk for her to pick up.

## 2023-10-13 ENCOUNTER — Telehealth: Payer: Self-pay | Admitting: Cardiology

## 2023-10-13 NOTE — Telephone Encounter (Signed)
 Patient came in today for lab work.  She said that the original FMLA form faxed to [facility] had the wrong date.  Instead of the procedure being done on 10/28/23, it is now scheduled for 10/25/23.  I spoke with her Ambulance person who said that I could just change the dates on the original form and re-send it.  I marked through the incorrect dates and wrote the correct ones. I faxed the form to [employer] and scanned the updated form to the chart.

## 2023-10-18 ENCOUNTER — Telehealth (HOSPITAL_COMMUNITY): Payer: Self-pay

## 2023-10-18 NOTE — Telephone Encounter (Signed)
 Spoke with patient to discuss upcoming procedure.   Confirmed patient is scheduled for a Implantable cardioverter defibrilator (ICD) on Tuesday, July 15 with Dr. Soyla Norton. Instructed patient to arrive at the Main Entrance A at [facility]: [address] [place], [place] [address] and check in at Admitting at 1:00 PM.   Labs to be completed by July 10.   Any recent signs of acute illness or been started on antibiotics? No  Any new medications started? No Any medications to hold? Hold Mounjaro for 1 week prior to the procedure- last dose on July 2. Hold Spironolactone  and Lasix  the morning of your procedure. Medication instructions:  On the morning of your procedure you may take all other prescribed medications not discussed with a sip of water.  No eating or drinking after midnight prior to procedure.   The night before your procedure and the morning of your procedure, wash thoroughly with the CHG surgical soap from the neck down, paying special attention to the area where your procedure will be performed.  Advised of plan to go home the same day and will only stay overnight if medically necessary. You MUST have a responsible adult to drive you home and MUST be with you the first 24 hours after you arrive home.  Patient verbalized understanding to all instructions provided and stated that she is currently waiting for her employer to give her time off to have procedure. Encouraged patient to follow up with employer today and to notify office ASAP, if she is not able to proceed with procedure as scheduled. She verbalized understanding.

## 2023-10-20 ENCOUNTER — Encounter: Payer: Self-pay | Admitting: Emergency Medicine

## 2023-10-21 ENCOUNTER — Telehealth: Payer: Self-pay | Admitting: Cardiology

## 2023-10-21 ENCOUNTER — Other Ambulatory Visit: Payer: Self-pay

## 2023-10-21 DIAGNOSIS — I5022 Chronic systolic (congestive) heart failure: Secondary | ICD-10-CM

## 2023-10-21 NOTE — Telephone Encounter (Signed)
 Please review

## 2023-10-21 NOTE — Telephone Encounter (Signed)
 Patient is calling in to get a procedure on 7/15 r/s. Please advis

## 2023-10-21 NOTE — Telephone Encounter (Signed)
 Pt's procedure has been moved from 7/15 to 8/13 at 4:00 pm with Dr. Inocencio. She will have updated labs done on 7/30.  I will mail her a copy of updated instruction letter and papers that were faxed to her employer. They are stating it did not state when she could return back to work but the date is on the paperwork. She will take the papers to her employer when she receives them in the mail.

## 2023-10-31 DIAGNOSIS — H6991 Unspecified Eustachian tube disorder, right ear: Secondary | ICD-10-CM | POA: Diagnosis not present

## 2023-10-31 DIAGNOSIS — R42 Dizziness and giddiness: Secondary | ICD-10-CM | POA: Diagnosis not present

## 2023-11-02 ENCOUNTER — Telehealth: Payer: Self-pay | Admitting: Cardiology

## 2023-11-02 NOTE — Telephone Encounter (Signed)
 Pt aware will send updated my chart message w/ date.  She appreciates the help with this.

## 2023-11-02 NOTE — Telephone Encounter (Signed)
  Pt is calling to follow up the work note she requested. She said, she is trying to get approval to get off work for her upcoming ICD implant procedure. She wants to have a specific date on how long she need to be out of work and restrictions

## 2023-11-08 ENCOUNTER — Ambulatory Visit

## 2023-11-15 DIAGNOSIS — M47816 Spondylosis without myelopathy or radiculopathy, lumbar region: Secondary | ICD-10-CM | POA: Diagnosis not present

## 2023-11-16 ENCOUNTER — Telehealth (HOSPITAL_COMMUNITY): Payer: Self-pay

## 2023-11-16 NOTE — Telephone Encounter (Signed)
 Attempted to reach patient to discuss upcoming procedure. Unable to leave message at cell number- VM full. No answer at home number.

## 2023-11-21 NOTE — Telephone Encounter (Signed)
 Attempted to reach patient again to discuss upcoming procedure, no answer at home number. Left VM on cell phone for patient to return call.

## 2023-11-22 NOTE — Telephone Encounter (Signed)
 No answer at cell number- VM full. No answer at home number- unable to leave message.

## 2023-11-23 ENCOUNTER — Encounter (HOSPITAL_COMMUNITY): Admission: RE | Payer: Self-pay | Source: Home / Self Care

## 2023-11-23 ENCOUNTER — Ambulatory Visit (HOSPITAL_COMMUNITY): Admission: RE | Admit: 2023-11-23 | Source: Home / Self Care | Admitting: Cardiology

## 2023-11-23 ENCOUNTER — Telehealth: Payer: Self-pay | Admitting: Cardiology

## 2023-11-23 SURGERY — ICD IMPLANT

## 2023-11-23 NOTE — Telephone Encounter (Signed)
 Patient wants to reschedule her procedure today as she took her Mounjaro shot.

## 2023-11-23 NOTE — Telephone Encounter (Signed)
 Received a call from patient, stating she just seen the MyChart message sent regarding procedure for today. States she had a lot going on and forgot. States she took Mounjaro injection on Monday, Aug 11 and ate this morning, last around one hour ago. Message sent to Dr. Inocencio and procedure scheduler to make aware that patient will need to be rescheduled. Patient voiced understanding and very apologetic.

## 2023-11-28 DIAGNOSIS — J45909 Unspecified asthma, uncomplicated: Secondary | ICD-10-CM | POA: Diagnosis not present

## 2023-11-28 DIAGNOSIS — H6593 Unspecified nonsuppurative otitis media, bilateral: Secondary | ICD-10-CM | POA: Diagnosis not present

## 2023-11-29 NOTE — Telephone Encounter (Signed)
 Pt calling in asking to r/s.

## 2023-12-01 ENCOUNTER — Encounter (HOSPITAL_COMMUNITY): Payer: Self-pay

## 2023-12-01 ENCOUNTER — Other Ambulatory Visit: Payer: Self-pay

## 2023-12-01 ENCOUNTER — Emergency Department (HOSPITAL_COMMUNITY)
Admission: EM | Admit: 2023-12-01 | Discharge: 2023-12-02 | Disposition: A | Discharge status: Home / Self Care | Attending: Emergency Medicine | Admitting: Emergency Medicine

## 2023-12-01 ENCOUNTER — Emergency Department (HOSPITAL_COMMUNITY)

## 2023-12-01 DIAGNOSIS — R079 Chest pain, unspecified: Secondary | ICD-10-CM

## 2023-12-01 DIAGNOSIS — E119 Type 2 diabetes mellitus without complications: Secondary | ICD-10-CM | POA: Insufficient documentation

## 2023-12-01 DIAGNOSIS — R0789 Other chest pain: Secondary | ICD-10-CM | POA: Insufficient documentation

## 2023-12-01 DIAGNOSIS — I509 Heart failure, unspecified: Secondary | ICD-10-CM | POA: Insufficient documentation

## 2023-12-01 DIAGNOSIS — Z79899 Other long term (current) drug therapy: Secondary | ICD-10-CM | POA: Insufficient documentation

## 2023-12-01 DIAGNOSIS — R0602 Shortness of breath: Secondary | ICD-10-CM | POA: Diagnosis not present

## 2023-12-01 DIAGNOSIS — R509 Fever, unspecified: Secondary | ICD-10-CM | POA: Diagnosis not present

## 2023-12-01 DIAGNOSIS — E876 Hypokalemia: Secondary | ICD-10-CM | POA: Insufficient documentation

## 2023-12-01 DIAGNOSIS — R918 Other nonspecific abnormal finding of lung field: Secondary | ICD-10-CM | POA: Diagnosis not present

## 2023-12-01 LAB — CBC
HCT: 30 % — ABNORMAL LOW (ref 36.0–46.0)
Hemoglobin: 9.2 g/dL — ABNORMAL LOW (ref 12.0–15.0)
MCH: 27 pg (ref 26.0–34.0)
MCHC: 30.7 g/dL (ref 30.0–36.0)
MCV: 88 fL (ref 80.0–100.0)
Platelets: 370 K/uL (ref 150–400)
RBC: 3.41 MIL/uL — ABNORMAL LOW (ref 3.87–5.11)
RDW: 14.4 % (ref 11.5–15.5)
WBC: 9.7 K/uL (ref 4.0–10.5)
nRBC: 0 % (ref 0.0–0.2)

## 2023-12-01 LAB — BASIC METABOLIC PANEL WITH GFR
Anion gap: 14 (ref 5–15)
BUN: 16 mg/dL (ref 6–20)
CO2: 26 mmol/L (ref 22–32)
Calcium: 7.6 mg/dL — ABNORMAL LOW (ref 8.9–10.3)
Chloride: 101 mmol/L (ref 98–111)
Creatinine, Ser: 0.69 mg/dL (ref 0.44–1.00)
GFR, Estimated: >60 mL/min (ref >60)
Glucose, Bld: 103 mg/dL — ABNORMAL HIGH (ref 70–99)
Potassium: 2.8 mmol/L — ABNORMAL LOW (ref 3.5–5.1)
Sodium: 141 mmol/L (ref 135–145)

## 2023-12-01 LAB — TROPONIN I (HIGH SENSITIVITY): Troponin I (High Sensitivity): 12 ng/L (ref <18)

## 2023-12-01 LAB — I-STAT CG4 LACTIC ACID, ED: Lactic Acid, Venous: 0.8 mmol/L (ref 0.5–1.9)

## 2023-12-01 MED ORDER — MAGNESIUM SULFATE 2 GM/50ML IV SOLN
2.0000 g | Freq: Once | INTRAVENOUS | Status: AC
  Administered 2023-12-01: 2 g via INTRAVENOUS
  Filled 2023-12-01: qty 50

## 2023-12-01 MED ORDER — POTASSIUM CHLORIDE 10 MEQ/100ML IV SOLN
10.0000 meq | Freq: Once | INTRAVENOUS | Status: AC
  Administered 2023-12-01: 10 meq via INTRAVENOUS
  Filled 2023-12-01: qty 100

## 2023-12-01 MED ORDER — ACETAMINOPHEN 325 MG PO TABS
650.0000 mg | ORAL_TABLET | Freq: Once | ORAL | Status: AC
  Administered 2023-12-01: 650 mg via ORAL
  Filled 2023-12-01: qty 2

## 2023-12-01 MED ORDER — POTASSIUM CHLORIDE CRYS ER 20 MEQ PO TBCR
40.0000 meq | EXTENDED_RELEASE_TABLET | Freq: Once | ORAL | Status: AC
  Administered 2023-12-01: 40 meq via ORAL
  Filled 2023-12-01: qty 2

## 2023-12-01 NOTE — ED Provider Notes (Signed)
 [place] EMERGENCY DEPARTMENT AT [facility] Provider Note   CSN: [id number] Arrival date & time: 12/01/23  8158     Patient presents with: Chest Pain   [name] is a 53 y.o. female.  With a history of nonischemic cardiomyopathy, heart failure and type 2 diabetes (ED for chest pain.  Patient currently being treated for ear infection on p.o. antibiotics.  3 days ago she began to experience right-sided chest pain which has persisted since the onset.  Subjective fevers at home.  No abdominal pain nausea vomiting or diarrhea.    Chest Pain      Prior to Admission medications   Medication Sig Start Date End Date Taking? Authorizing Provider  albuterol  (PROVENTIL ) (2.5 MG/3ML) 0.083% nebulizer solution Take 2.5 mg by nebulization every 6 (six) hours as needed for wheezing or shortness of breath.    [provider]  albuterol  (VENTOLIN  HFA) 108 (90 Base) MCG/ACT inhaler Inhale 2 puffs into the lungs every 6 (six) hours as needed for wheezing or shortness of breath.    [provider]  benzonatate  (TESSALON ) 100 MG capsule Take 1 capsule (100 mg total) by mouth as needed for cough. 02/26/21   Lenetta No D, NP  betamethasone dipropionate (DIPROLENE) 0.05 % ointment As needed 05/03/19   [provider]  cholecalciferol (VITAMIN D3) 25 MCG (1000 UNIT) tablet Take 25 mcg by mouth daily.    [provider]  cyclobenzaprine  (FLEXERIL ) 10 MG tablet Take 1 tablet (10 mg total) by mouth at bedtime as needed for muscle spasms. 01/29/21   Joldersma, Logan, PA-C  ENBREL 50 MG/ML injection Inject 50 mg into the skin once a week. 07/27/23   [provider]  fluconazole  (DIFLUCAN ) 150 MG tablet Take 1 tablet (150 mg total) by mouth daily. Patient not taking: Reported on 08/29/2023 12/24/22   Geiple, Joshua, PA-C  Fluocinolone Acetonide Scalp 0.01 % OIL daily as needed. 04/26/19   [provider]  fluticasone (FLONASE) 50 MCG/ACT nasal spray  Place 2 sprays into both nostrils daily as needed. 04/07/21   [provider]  folic acid  (FOLVITE ) 1 MG tablet Take 1 mg by mouth daily.    [provider]  furosemide  (LASIX ) 40 MG tablet Take 1 tablet (40 mg total) by mouth daily. 07/25/23   Clegg, Amy D, NP  gabapentin (NEURONTIN) 600 MG tablet Take 600 mg by mouth daily as needed. 03/13/21   [provider]  isosorbide -hydrALAZINE  (BIDIL ) 20-37.5 MG tablet Take 2 tablets by mouth 3 (three) times daily. 05/31/23   Rolan Ezra RAMAN, MD  levocetirizine (XYZAL ) 5 MG tablet Take 5 mg by mouth at bedtime.    [provider]  meloxicam (MOBIC) 15 MG tablet Take 15 mg by mouth daily as needed. 06/26/21   [provider]  metoprolol  succinate (TOPROL -XL) 100 MG 24 hr tablet Take 1 tablet (100 mg total) by mouth in the morning and at bedtime. Take with or immediately following a meal. 05/31/23   Rolan Ezra RAMAN, MD  minoxidil (LONITEN) 2.5 MG tablet Take 2.5 mg by mouth daily.    [provider]  montelukast (SINGULAIR) 10 MG tablet Take 10 mg by mouth every morning. 05/17/21   [provider]  MOUNJARO 5 MG/0.5ML Pen Inject 5 mg into the skin once a week. On Wednesdays 06/16/21   [provider]  omeprazole  (PRILOSEC) 40 MG capsule Take 1 capsule (40 mg total) by mouth daily before breakfast. 03/28/18   Sebastian,  Toribio GAILS, MD  potassium chloride  SA (KLOR-CON  M) 10 MEQ tablet Take 4 tablets (40 mEq total) by mouth daily. 08/16/23   Milford, Harlene HERO, FNP  sacubitril -valsartan  (ENTRESTO ) 97-103 MG Take 1 tablet by mouth 2 (two) times daily. 05/31/23   Rolan Ezra RAMAN, MD  spironolactone  (ALDACTONE ) 25 MG tablet Take 1 tablet (25 mg total) by mouth daily. 05/31/23   Rolan Ezra RAMAN, MD  SYMBICORT  160-4.5 MCG/ACT inhaler Inhale 2 puffs into the lungs daily as needed (for respiratory issues.).  06/01/13   [provider]  traZODone (DESYREL) 50 MG tablet Take 50 mg by mouth at bedtime.  05/21/21   [provider]  valACYclovir  (VALTREX ) 500 MG tablet Take 500 mg by mouth 2 (two) times daily as needed (for cold sores.).    [provider]    Allergies: Clarithromycin, Hydrocodone, Hydrocodone-acetaminophen , Ondansetron , Penicillins, Tdap [tetanus-diphth-acell pertussis], and Tetanus toxoid    Review of Systems  Cardiovascular:  Positive for chest pain.    Updated Vital Signs BP (!) 158/96   Pulse 96   Temp 98.9 F (37.2 C) (Oral)   Resp (!) 32   Ht 5' 2 (1.575 m)   Wt 99.8 kg   LMP 08/10/2019 (Exact Date)   SpO2 95%   BMI 40.24 kg/m   Physical Exam Vitals and nursing note reviewed.  HENT:     Head: Normocephalic and atraumatic.  Eyes:     Pupils: Pupils are equal, round, and reactive to light.  Cardiovascular:     Rate and Rhythm: Normal rate and regular rhythm.  Pulmonary:     Effort: Pulmonary effort is normal.     Breath sounds: Normal breath sounds.  Abdominal:     Palpations: Abdomen is soft.     Tenderness: There is no abdominal tenderness.  Skin:    General: Skin is warm and dry.  Neurological:     Mental Status: She is alert.  Psychiatric:        Mood and Affect: Mood normal.     (all labs ordered are listed, but only abnormal results are displayed) Labs Reviewed  BASIC METABOLIC PANEL WITH GFR - Abnormal; Notable for the following components:      Result Value   Potassium 2.8 (*)    Glucose, Bld 103 (*)    Calcium  7.6 (*)    All other components within normal limits  CBC - Abnormal; Notable for the following components:   RBC 3.41 (*)    Hemoglobin 9.2 (*)    HCT 30.0 (*)    All other components within normal limits  I-STAT CG4 LACTIC ACID, ED  I-STAT CG4 LACTIC ACID, ED  TROPONIN I (HIGH SENSITIVITY)    EKG: EKG Interpretation Date/Time:  Thursday December 01 2023 18:54:18 EDT Ventricular Rate:  105 PR Interval:  155 QRS Duration:  95 QT Interval:  330 QTC Calculation: 437 R Axis:   8  Text  Interpretation: Sinus tachycardia LVH with secondary repolarization abnormality Confirmed by Pamella Sharper [phone]) on 12/01/2023 10:03:38 PM  Radiology: DG Chest 2 View Result Date: 12/01/2023 CLINICAL DATA:  Chest pain and shortness of breath EXAM: CHEST - 2 VIEW COMPARISON:  03/10/2022 FINDINGS: Stable cardiomediastinal silhouette. Diffuse interstitial coarsening and bronchial wall thickening suspicious for airway infection/inflammation. No pleural effusion or pneumothorax. IMPRESSION: Findings suspicious for bronchitis/reactive airways. Electronically Signed   By: Norman Gatlin M.D.   On: 12/01/2023 19:39     .Critical Care  Performed by: Pamella Sharper LABOR, DO Authorized  by: Pamella Ozell LABOR, DO   Critical care provider statement:    Critical care time (minutes):  30   Critical care was necessary to treat or prevent imminent or life-threatening deterioration of the following conditions:  Metabolic crisis   Critical care was time spent personally by me on the following activities:  Development of treatment plan with patient or surrogate, discussions with consultants, evaluation of patient's response to treatment, examination of patient, ordering and review of laboratory studies, ordering and review of radiographic studies, ordering and performing treatments and interventions, pulse oximetry, re-evaluation of patient's condition and review of old charts   I assumed direction of critical care for this patient from another provider in my specialty: no      Medications Ordered in the ED  acetaminophen  (TYLENOL ) tablet 650 mg (650 mg Oral Given 12/01/23 1857)  potassium chloride  SA (KLOR-CON  M) CR tablet 40 mEq (40 mEq Oral Given 12/01/23 2232)  magnesium  sulfate IVPB 2 g 50 mL (2 g Intravenous New Bag/Given 12/01/23 2232)  potassium chloride  10 mEq in 100 mL IVPB (10 mEq Intravenous New Bag/Given 12/01/23 2232)                                    Medical Decision Making 53 year old female  with history as above presenting for chest pain.  Currently undergoing treatment with outpatient antibiotics for ear infection.  Is hypertensive initially febrile on exam.  Symptoms have resolved after Tylenol .  Laboratory workup notable for recurrent hypokalemia.  Will replete potassium magnesium  here.  Cardiac workup negative for ACS or dysrhythmia..  No focal consolidation on chest x-ray patient appropriate for discharge with plan for outpatient follow-up  Amount and/or Complexity of Data Reviewed Labs: ordered. Radiology: ordered.  Risk OTC drugs. Prescription drug management.        Final diagnoses:  Hypokalemia  Chest pain, unspecified type    ED Discharge Orders     None          Pamella Ozell LABOR, DO 12/01/23 2339

## 2023-12-01 NOTE — ED Provider Triage Note (Signed)
 Emergency Medicine Provider Triage Evaluation Note  [name] , a 53 y.o. female  was evaluated in triage.  Pt complains of bilateral ear infection and chest pain. Diagnosed with ear infection and is on day 3 of abx, started having chest pain and chills so decided to come in. Denies shortness of breath, abdominal pain, nausea, vomiting. Noted to have a fever in triage, symptoms improved with tylenol .  Review of Systems  Positive: Bilateral ear pain, chest pain, fever Negative: Shortness of breath, abdominal pain, nausea, vomiting  Physical Exam  BP (!) 151/94   Pulse (!) 105   Temp (!) 100.7 F (38.2 C) (Oral)   Resp 18   Ht 5' 2 (1.575 m)   Wt 99.8 kg   LMP 08/10/2019 (Exact Date)   SpO2 100%   BMI 40.24 kg/m  Gen:   Awake, no distress   Resp:  Normal effort  MSK:   Moves extremities without difficulty  Other:  Bilateral tm's not erythematous however appears to be purulent behind tm's, patient talking in full sentences on room air  Medical Decision Making  Medically screening exam initiated at 8:13 PM.  Appropriate orders placed.  [name] was informed that the remainder of the evaluation will be completed by another provider, this initial triage assessment does not replace that evaluation, and the importance of remaining in the ED until their evaluation is complete.  Orders: CBC, BMP, tylenol , troponin, CXR, EKG, lactic   Janetta Terrall FALCON, PA-C 12/01/23 2018

## 2023-12-01 NOTE — Discharge Instructions (Addendum)
 You were seen in the emerged from for chest pain Your chest x-ray and EKG looked okay Your laboratory workup was notable for low potassium We gave you potassium magnesium  here You will need to have your potassium repeat checked with your primary care doctor Return to the emergency room for chest pain trouble breathing or any concerns Continue taking antibiotics and any prescribed occasions at home

## 2023-12-01 NOTE — ED Triage Notes (Signed)
 Patient said she had an ear infection, fluid in both ears. Is on the 3rd day of antibiotics. Then began having right sided chest pain since this morning. No radiation. No vomiting.

## 2023-12-05 NOTE — Telephone Encounter (Signed)
 Called pt and rescheduled her ICD Implant with Dr. Inocencio. She is scheduled on 10/13 at 3:00 pm. She will have her labs done on 9/29 at [facility].   Instruction letter has been mailed to pt.

## 2023-12-08 ENCOUNTER — Ambulatory Visit

## 2023-12-27 ENCOUNTER — Telehealth (HOSPITAL_COMMUNITY): Payer: Self-pay

## 2023-12-27 DIAGNOSIS — Z79899 Other long term (current) drug therapy: Secondary | ICD-10-CM | POA: Diagnosis not present

## 2023-12-27 DIAGNOSIS — M2550 Pain in unspecified joint: Secondary | ICD-10-CM | POA: Diagnosis not present

## 2023-12-27 DIAGNOSIS — M0600 Rheumatoid arthritis without rheumatoid factor, unspecified site: Secondary | ICD-10-CM | POA: Diagnosis not present

## 2023-12-27 NOTE — Telephone Encounter (Signed)
 Attempted to reach patient to discuss upcoming procedure, no answer. Unable to leave message due to VM full. Attempted alternate home number listed- recording stated call cannot be completed as dialed.

## 2023-12-27 NOTE — Progress Notes (Incomplete)
 PCP: Dr. Dyane Cardiology: Dr. Verlin HF Cardiology: Dr. Rolan  Chief complaint: CHF  53 y.o. with history of nonischemic cardiomyopathy/chronic systolic CHF and rheumatoid arthritis.   Patient has had a cardiomyopathy known for a number of years now.  Coronary angiography in 2012 showed no significant disease.  EF has been in the 25-30% range long-term.  Most recent echo in 8/18 showed EF remains 25-30%.  She has no family history of cardiomyopathy and she has never been a heavy drinker.  She has not used illicit drugs.  RHC in 10/18 showed preserved cardiac output with mildly increased filling pressures.  Lasix  was increased to 120 qam/80 qpm.  She was seen by EP (Camnitz) and decided against ICD.    Echo in 3/23 showed EF lower at 30-35%, mild-moderate LV dilation, normal RV.  Echo in 12/23 showed EF 25-30%, global hypokinesis, LV mildly dilated, mildly decreased RV systolic function, IVC normal. Echo in 5/24 showed EF 30-35%, normal RV, normal IVC.    06/2023 Echo EF 25-30%. She was referred to EP for ICD, plan for placement in 7/25.     She returns for HF follow up. She is currently wearing a Zio monitor due to palpitations.  Still has occasional palpitations, no lightheadedness or syncope.  She is off Farxiga  because of multiple yeast infections. No dyspnea walking on flat ground.  No problems with 1 flight of stairs.  No chest pain. She uses 2 pillows at night, no PND.  RA is currently well-treated, minimal joint pain.  She continues to have some generalized fatigue.   ECG (personally reviewed): NSR, LVH with repolarization abnormality.   Labs (4/25): BNP 160, K 3.1, creatinine 0.76  PMH: 1. Rheumatoid arthritis: Followed by Dr. Lavell.  2. Type II diabetes.  3. HTN 4. Chronic systolic CHF: Nonischemic cardiomyopathy.  - LHC (2012): No significant CAD.  - Echo (7/13): EF 30-35% - Cardiac MRI (12/14): Mild LV dilation, EF 40%, normal RV size and systolic function, no late gadolinium  enhancement.  - Echo (7/16): EF 25-30% - Echo (8/18): EF 25-30% - RHC (10/18): mean RA 11, PA 47/20 mean 32, mean PCWP 16, CI 3.06 Fick/3.5 thermo, PVR 2.65 WU.  - Echo (10/19): EF 40-45%, diffuse hypokinesis, moderate diastolic dysfunction, normal RV size and systolic function  - Echo (2/21): EF 40-45%, diffuse hypokinesis.  - Echo (2/23): EF lower at 30-35%, mild-moderate LV dilation, normal RV.  - Echo (12/23): EF 25-30%, global hypokinesis, LV mildly dilated, mildly decreased RV systolic function, IVC normal.  - Echo (5/24): EF 30-35%, normal RV, normal IVC. - Echo (3/25):  EF 25-30%. Referred to EP for ICD.   SH: Lives alone.  Rare ETOH, no drugs.  No smoking.   Family History  Problem Relation Age of Onset   Heart attack Mother    CAD Mother    Allergies Mother    Breast cancer Mother    Hypertension Other    Diabetes Mellitus II Other    ROS: All systems reviewed and negative except as per HPI.   Current Outpatient Medications  Medication Sig Dispense Refill   albuterol  (PROVENTIL ) (2.5 MG/3ML) 0.083% nebulizer solution Take 2.5 mg by nebulization every 6 (six) hours as needed for wheezing or shortness of breath.     albuterol  (VENTOLIN  HFA) 108 (90 Base) MCG/ACT inhaler Inhale 2 puffs into the lungs every 6 (six) hours as needed for wheezing or shortness of breath.     benzonatate  (TESSALON ) 100 MG capsule Take 1 capsule (  100 mg total) by mouth as needed for cough. 20 capsule 1   betamethasone dipropionate (DIPROLENE) 0.05 % ointment As needed     cholecalciferol (VITAMIN D3) 25 MCG (1000 UNIT) tablet Take 25 mcg by mouth daily.     cyclobenzaprine  (FLEXERIL ) 10 MG tablet Take 1 tablet (10 mg total) by mouth at bedtime as needed for muscle spasms. 8 tablet 0   ENBREL 50 MG/ML injection Inject 50 mg into the skin once a week.     fluconazole  (DIFLUCAN ) 150 MG tablet Take 1 tablet (150 mg total) by mouth daily. (Patient not taking: Reported on 08/29/2023) 1 tablet 0    Fluocinolone Acetonide Scalp 0.01 % OIL daily as needed.     fluticasone (FLONASE) 50 MCG/ACT nasal spray Place 2 sprays into both nostrils daily as needed.     folic acid  (FOLVITE ) 1 MG tablet Take 1 mg by mouth daily.     furosemide  (LASIX ) 40 MG tablet Take 1 tablet (40 mg total) by mouth daily. 90 tablet 3   gabapentin (NEURONTIN) 600 MG tablet Take 600 mg by mouth daily as needed.     isosorbide -hydrALAZINE  (BIDIL ) 20-37.5 MG tablet Take 2 tablets by mouth 3 (three) times daily. 540 tablet 3   levocetirizine (XYZAL ) 5 MG tablet Take 5 mg by mouth at bedtime.     meloxicam (MOBIC) 15 MG tablet Take 15 mg by mouth daily as needed.     metoprolol  succinate (TOPROL -XL) 100 MG 24 hr tablet Take 1 tablet (100 mg total) by mouth in the morning and at bedtime. Take with or immediately following a meal. 180 tablet 3   minoxidil (LONITEN) 2.5 MG tablet Take 2.5 mg by mouth daily.     montelukast (SINGULAIR) 10 MG tablet Take 10 mg by mouth every morning.     MOUNJARO 5 MG/0.5ML Pen Inject 5 mg into the skin once a week. On Wednesdays     omeprazole  (PRILOSEC) 40 MG capsule Take 1 capsule (40 mg total) by mouth daily before breakfast. 30 capsule 0   potassium chloride  SA (KLOR-CON  M) 10 MEQ tablet Take 4 tablets (40 mEq total) by mouth daily. 120 tablet 6   sacubitril -valsartan  (ENTRESTO ) 97-103 MG Take 1 tablet by mouth 2 (two) times daily. 180 tablet 3   spironolactone  (ALDACTONE ) 25 MG tablet Take 1 tablet (25 mg total) by mouth daily. 90 tablet 3   SYMBICORT  160-4.5 MCG/ACT inhaler Inhale 2 puffs into the lungs daily as needed (for respiratory issues.).      traZODone (DESYREL) 50 MG tablet Take 50 mg by mouth at bedtime.     valACYclovir  (VALTREX ) 500 MG tablet Take 500 mg by mouth 2 (two) times daily as needed (for cold sores.).     No current facility-administered medications for this visit.   Wt Readings from Last 3 Encounters:  12/01/23 99.8 kg (220 lb)  08/29/23 103 kg (227 lb)  08/08/23  99.8 kg (220 lb)    LMP 08/10/2019 (Exact Date)  General: NAD Neck: No JVD, no thyromegaly or thyroid  nodule.  Lungs: Clear to auscultation bilaterally with normal respiratory effort. CV: Nondisplaced PMI.  Heart regular S1/S2, no S3/S4, no murmur.  No peripheral edema.  No carotid bruit.  Normal pedal pulses.  Abdomen: Soft, nontender, no hepatosplenomegaly, no distention.  Skin: Intact without lesions or rashes.  Neurologic: Alert and oriented x 3.  Psych: Normal affect. Extremities: No clubbing or cyanosis.  HEENT: Normal.   Assessment/Plan: 1. Chronic systolic CHF: Nonischemic cardiomyopathy,  cath showed no significant coronary disease at time of diagnosis.  Etiology uncertain, possible prior viral myocarditis.  CMRI in 12/14 with no LGE noted.  No heavy drinking or drugs.  No history of familial cardiomyopathy.  Echo in 2/21 showed stable EF 40-45%.  Echo in 3/23 with EF lower at 30-35%, mild-moderate LV dilation, normal RV.  Echo in 12/23 showed EF 25-30%, global hypokinesis, LV mildly dilated, mildly decreased RV systolic function, IVC normal. Echo in 3/25 with EF 25-30%. NYHA class II, not volume overloaded on exam.  - She will be getting an ICD in 7/25.  Narrow QRS so not CRT candidate.    - Continue Lasix  40 mg daily. BMET/BNP today.  - Continue spironolactone  25 mg daily.   - Continue Toprol  XL 100 mg bid.   - Continue Bidil  2 tabs tid.   - Continue Entresto  97/103 bid.    - She is off Farxiga  due to multiple yeast infections.  2. Rheumatoid arthritis: She sees a rheumatologist, now on Enbrel.  3. Obesity: There is no height or weight on file to calculate BMI. - Continue Mounjaro 4. Palpitations: No worrisome lightheadedness or syncope. She is wearing a Zio monitor, await results.   Follow up in 4 months with APP.   I spent 31 minutes reviewing records, interviewing/examining patient, and managing orders.    Harlene HERO [facility]  12/27/2023

## 2023-12-30 ENCOUNTER — Encounter (HOSPITAL_COMMUNITY)

## 2023-12-30 DIAGNOSIS — M47816 Spondylosis without myelopathy or radiculopathy, lumbar region: Secondary | ICD-10-CM | POA: Diagnosis not present

## 2024-01-02 DIAGNOSIS — M25511 Pain in right shoulder: Secondary | ICD-10-CM | POA: Diagnosis not present

## 2024-01-03 DIAGNOSIS — M25511 Pain in right shoulder: Secondary | ICD-10-CM | POA: Diagnosis not present

## 2024-01-04 DIAGNOSIS — M25511 Pain in right shoulder: Secondary | ICD-10-CM | POA: Diagnosis not present

## 2024-01-09 ENCOUNTER — Telehealth: Payer: Self-pay | Admitting: Cardiology

## 2024-01-09 ENCOUNTER — Encounter: Payer: Self-pay | Admitting: *Deleted

## 2024-01-09 DIAGNOSIS — I5022 Chronic systolic (congestive) heart failure: Secondary | ICD-10-CM | POA: Diagnosis not present

## 2024-01-09 NOTE — Telephone Encounter (Signed)
 Attempted pt, unable to leave message.  Will send her a [employer].[email or web address]

## 2024-01-09 NOTE — Telephone Encounter (Signed)
 Hey I have pt here to pick up paperwork for her return date back to work after her Implantable device procedure on 10.13.2025. 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 Pt supervisor is asking for proof of the documents. Pt call back number is [phone].

## 2024-01-10 LAB — CBC
Hematocrit: 31.3 % — ABNORMAL LOW (ref 34.0–46.6)
Hemoglobin: 9.8 g/dL — ABNORMAL LOW (ref 11.1–15.9)
MCH: 26.8 pg (ref 26.6–33.0)
MCHC: 31.3 g/dL — ABNORMAL LOW (ref 31.5–35.7)
MCV: 86 fL (ref 79–97)
Platelets: 398 x10E3/uL (ref 150–450)
RBC: 3.65 x10E6/uL — ABNORMAL LOW (ref 3.77–5.28)
RDW: 15.4 % (ref 11.7–15.4)
WBC: 7.9 x10E3/uL (ref 3.4–10.8)

## 2024-01-10 LAB — BASIC METABOLIC PANEL WITH GFR
BUN/Creatinine Ratio: 20 (ref 9–23)
BUN: 16 mg/dL (ref 6–24)
CO2: 25 mmol/L (ref 20–29)
Calcium: 9.3 mg/dL (ref 8.7–10.2)
Chloride: 100 mmol/L (ref 96–106)
Creatinine, Ser: 0.82 mg/dL (ref 0.57–1.00)
Glucose: 123 mg/dL — AB (ref 70–99)
Potassium: 4.1 mmol/L (ref 3.5–5.2)
Sodium: 139 mmol/L (ref 134–144)
eGFR: 87 mL/min/1.73 (ref >59)

## 2024-01-22 NOTE — Pre-Procedure Instructions (Signed)
 Attempted to call patient regarding procedure instructions.  Left voicemail on the following items: Arrival time 1200, new arrival time Nothing to eat or drink after midnight No meds AM of procedure Responsible person to drive you home and stay with you for 24 hrs Wash with special soap night before and morning of procedure.

## 2024-01-23 ENCOUNTER — Ambulatory Visit (HOSPITAL_COMMUNITY)
Admission: RE | Admit: 2024-01-23 | Discharge: 2024-01-23 | Disposition: A | Discharge status: Home / Self Care | Attending: Cardiology | Admitting: Cardiology

## 2024-01-23 ENCOUNTER — Ambulatory Visit (HOSPITAL_COMMUNITY)

## 2024-01-23 ENCOUNTER — Other Ambulatory Visit: Payer: Self-pay

## 2024-01-23 ENCOUNTER — Ambulatory Visit (HOSPITAL_COMMUNITY)
Admission: RE | Disposition: A | Discharge status: Home / Self Care | Payer: Self-pay | Source: Home / Self Care | Attending: Cardiology

## 2024-01-23 DIAGNOSIS — I110 Hypertensive heart disease with heart failure: Secondary | ICD-10-CM | POA: Insufficient documentation

## 2024-01-23 DIAGNOSIS — I429 Cardiomyopathy, unspecified: Secondary | ICD-10-CM | POA: Diagnosis not present

## 2024-01-23 DIAGNOSIS — I255 Ischemic cardiomyopathy: Secondary | ICD-10-CM | POA: Insufficient documentation

## 2024-01-23 DIAGNOSIS — I5022 Chronic systolic (congestive) heart failure: Secondary | ICD-10-CM | POA: Diagnosis present

## 2024-01-23 HISTORY — PX: ICD IMPLANT: EP1208

## 2024-01-23 LAB — GLUCOSE, CAPILLARY: Glucose-Capillary: 107 mg/dL — ABNORMAL HIGH (ref 70–99)

## 2024-01-23 SURGERY — ICD IMPLANT

## 2024-01-23 MED ORDER — CHLORHEXIDINE GLUCONATE 4 % EX SOLN
4.0000 | Freq: Once | CUTANEOUS | Status: DC
  Filled 2024-01-23: qty 60

## 2024-01-23 MED ORDER — SODIUM CHLORIDE 0.9 % IV SOLN: 80.0000 mg | INTRAVENOUS | Status: AC

## 2024-01-23 MED ORDER — FENTANYL CITRATE (PF) 100 MCG/2ML IJ SOLN
INTRAMUSCULAR | Status: AC
  Filled 2024-01-23: qty 2

## 2024-01-23 MED ORDER — SODIUM CHLORIDE 0.9 % IV SOLN
INTRAVENOUS | Status: AC
  Administered 2024-01-23: 80 mg
  Filled 2024-01-23: qty 2

## 2024-01-23 MED ORDER — MIDAZOLAM HCL 2 MG/2ML IJ SOLN
INTRAMUSCULAR | Status: AC
  Filled 2024-01-23: qty 2

## 2024-01-23 MED ORDER — MIDAZOLAM HCL 5 MG/5ML IJ SOLN
INTRAMUSCULAR | Status: DC | PRN
  Administered 2024-01-23 (×3): 1 mg via INTRAVENOUS

## 2024-01-23 MED ORDER — LIDOCAINE HCL (PF) 1 % IJ SOLN
INTRAMUSCULAR | Status: DC | PRN
  Administered 2024-01-23: 30 mL

## 2024-01-23 MED ORDER — CEFAZOLIN SODIUM-DEXTROSE 2-4 GM/100ML-% IV SOLN: 2.0000 g | INTRAVENOUS | Status: AC

## 2024-01-23 MED ORDER — FENTANYL CITRATE (PF) 100 MCG/2ML IJ SOLN
INTRAMUSCULAR | Status: DC | PRN
  Administered 2024-01-23 (×2): 25 ug via INTRAVENOUS

## 2024-01-23 MED ORDER — LIDOCAINE HCL (PF) 1 % IJ SOLN
INTRAMUSCULAR | Status: AC
  Filled 2024-01-23: qty 60

## 2024-01-23 MED ORDER — HEPARIN (PORCINE) IN NACL 1000-0.9 UT/500ML-% IV SOLN
INTRAVENOUS | Status: DC | PRN
  Administered 2024-01-23: 500 mL

## 2024-01-23 MED ORDER — CEFAZOLIN SODIUM-DEXTROSE 2-4 GM/100ML-% IV SOLN
INTRAVENOUS | Status: AC
  Administered 2024-01-23: 2 g via INTRAVENOUS
  Filled 2024-01-23: qty 100

## 2024-01-23 MED ORDER — ACETAMINOPHEN 325 MG PO TABS
650.0000 mg | ORAL_TABLET | Freq: Four times a day (QID) | ORAL | Status: DC | PRN
  Administered 2024-01-23: 650 mg via ORAL
  Filled 2024-01-23: qty 2

## 2024-01-23 MED ORDER — POVIDONE-IODINE 10 % EX SWAB: 2.0000 | Freq: Once | CUTANEOUS | Status: DC

## 2024-01-23 MED ORDER — SODIUM CHLORIDE 0.9 % IV SOLN: INTRAVENOUS | Status: DC

## 2024-01-23 SURGICAL SUPPLY — 7 items
CABLE SURGICAL S-101-97-12 (CABLE) ×1 IMPLANT
ICD COBALT XT VR DVPA2D4 (ICD Generator) IMPLANT
LEAD SPRINT QUAT SEC 6935M-62 (Lead) IMPLANT
PAD DEFIB RADIO PHYSIO CONN (PAD) ×1 IMPLANT
SHEATH 9FR PRELUDE SNAP 13 (SHEATH) IMPLANT
SHEATH PROBE COVER 6X72 (BAG) IMPLANT
TRAY PACEMAKER INSERTION (PACKS) ×1 IMPLANT

## 2024-01-23 NOTE — Discharge Instructions (Signed)
 After Your ICD (Implantable Cardiac Defibrillator)   You have a Medtronic ICD  If you have a Medtronic or Biotronik device, plug in your home monitor once you get home, and no manual interaction is required.   If you have an [employer] or [employer] device, plug your home monitor once you get home, sit near the device, and press the large activation button. Sit nearby until the process is complete, usually notated by lights on the monitor.   If you were set up for monitoring using an app on your phone, make sure the app remains open in the background and the Bluetooth remains on.  ACTIVITY Do not lift your arm above shoulder height for 1 week after your procedure. After 7 days, you may progress as below.  You should remove your sling 24 hours after your procedure, unless otherwise instructed by your provider.     Monday January 30, 2024  Tuesday January 31, 2024 Wednesday February 01, 2024 Thursday February 02, 2024   Do not lift, push, pull, or carry anything over 10 pounds with the affected arm until 6 weeks (Monday March 05, 2024 ) after your procedure.   You may drive AFTER your wound check, unless you have been told otherwise by your provider.   Ask your healthcare provider when you can go back to work   INCISION/Dressing If you are on a blood thinner such as Coumadin, Xarelto, Eliquis, Plavix, or Pradaxa please confirm with your provider when this should be resumed.   If large square, outer bandage is left in place, this can be removed after 24 hours from your procedure. Do not remove steri-strips or glue as below.   Monitor your defibrillator site for redness, swelling, and drainage. Call the device clinic at [phone] if you experience these symptoms or fever/chills.  If your incision is sealed with Steri-strips or staples, you may shower 7 days after your procedure or when told by your provider. Do not remove the steri-strips or let the shower hit directly on your  site. You may wash around your site with soap and water.    If you were discharged in a sling, please do not wear this during the day more than 48 hours after your surgery unless otherwise instructed. This may increase the risk of stiffness and soreness in your shoulder.   Avoid lotions, ointments, or perfumes over your incision until it is well-healed.  You may use a hot tub or a pool AFTER your wound check appointment if the incision is completely closed.  Your ICD is designed to protect you from life threatening heart rhythms. Because of this, you may receive a shock.   1 shock with no symptoms:  Call the office during business hours. 1 shock with symptoms (chest pain, chest pressure, dizziness, lightheadedness, shortness of breath, overall feeling unwell):  Call 911. If you experience 2 or more shocks in 24 hours:  Call 911. If you receive a shock, you should not drive for 6 months per the [place] DMV IF you receive appropriate therapy from your ICD.   ICD Alerts:  Some alerts are vibratory and others beep. These are NOT emergencies. Please call our office to let us  know. If this occurs at night or on weekends, it can wait until the next business day. Send a remote transmission.  If your device is capable of reading fluid status (for heart failure), you will be offered monthly monitoring to review this with you.   DEVICE MANAGEMENT Remote monitoring  is used to monitor your ICD from home. This monitoring is scheduled every 91 days by our office. It allows us  to keep an eye on the functioning of your device to ensure it is working properly. You will routinely see your Electrophysiologist annually (more often if necessary). This will appear as a REMOTE check on your MyChart schedule. These are automatic and there is nothing for you to manually do unless otherwise instructed.  You should receive your ID card for your new device in 4-8 weeks. Keep this card with you at all times once received.  Consider wearing a medical alert bracelet or necklace.  Your ICD  may be MRI compatible. This will be discussed at your next office visit/wound check.  You should avoid contact with strong electric or magnetic fields.   Do not use amateur (ham) radio equipment or electric (arc) welding torches. MP3 player headphones with magnets should not be used. Some devices are safe to use if held at least 12 inches (30 cm) from your defibrillator. These include power tools, lawn mowers, and speakers. If you are unsure if something is safe to use, ask your health care provider.  When using your cell phone, hold it to the ear that is on the opposite side from the defibrillator. Do not leave your cell phone in a pocket over the defibrillator.  You may safely use electric blankets, heating pads, computers, and microwave ovens.  Call the office right away if: You have chest pain. You feel more than one shock. You feel more short of breath than you have felt before. You feel more light-headed than you have felt before. Your incision starts to open up.  This information is not intended to replace advice given to you by your health care provider. Make sure you discuss any questions you have with your health care provider.

## 2024-01-23 NOTE — H&P (Signed)
  Electrophysiology Office Note:   Date:  01/23/2024  ID:  [name], DOB 02-24-1971, MRN [id number]  Primary Cardiologist: Lonni Cash, MD Primary Heart Failure: Ezra Shuck, MD Electrophysiologist: Soyla Gladis Norton, MD  Issue  History of Present Illness:   [name] is a 53 y.o. female with h/o chronic systolic heart failure, rheumatoid arthritis, type 2 diabetes, hypertension seen today for  for Electrophysiology evaluation of chronic systolic heart failure at the request of Ezra Shuck.    She has had a cardiomyopathy for a number of years.  Ejection fraction 25 to 30%.  Today, denies symptoms of palpitations, chest pain, dyspnea, orthopnea, PND, lower extremity edema, claudication, dizziness, presyncope, syncope, bleeding, or neurologic sequela. The patient is tolerating medications without difficulties. Plan ICD implant today.   EP Information / Studies Reviewed:    EKG is not ordered today. EKG from 07/04/23 reviewed which showed sinus rhythm        Risk Assessment/Calculations:             Physical Exam:   VS:  BP (!) 108/90   Pulse 93   Temp (!) 97.4 F (36.3 C) (Oral)   Resp 17   Ht 5' 2 (1.575 m)   Wt 98.9 kg   LMP 08/10/2019 (Exact Date)   SpO2 99%   BMI 39.87 kg/m    Wt Readings from Last 3 Encounters:  01/23/24 98.9 kg  12/01/23 99.8 kg  08/29/23 103 kg    GEN: Well nourished, well developed in no acute distress NECK: No JVD; No carotid bruits CARDIAC: Regular rate and rhythm, no murmurs, rubs, gallops RESPIRATORY:  Clear to auscultation without rales, wheezing or rhonchi  ABDOMEN: Soft, non-tender, non-distended EXTREMITIES:  No edema; No deformity    ASSESSMENT AND PLAN:    1.  Chronic systolic heart failure: [name] has presented today for surgery, with the diagnosis of CHF.  The various methods of treatment have been discussed with the patient and family. After consideration of risks, benefits and other options for  treatment, the patient has consented to  Procedure(s): ICD implant as a surgical intervention .  Risks include but not limited to bleeding, infection, pneumothorax, perforation, tamponade, vascular damage, renal failure, MI, stroke, death, and lead dislodgement . The patient's history has been reviewed, patient examined, no change in status, stable for surgery.  I have reviewed the patient's chart and labs.  Questions were answered to the patient's satisfaction.    Oniel Meleski Norton, MD 01/23/2024 2:06 PM

## 2024-01-23 NOTE — Progress Notes (Signed)
 Verbal order for Tylenol  650mg  every 6 hours PRN. Read back and confirmed. Order placed.

## 2024-01-24 ENCOUNTER — Encounter (HOSPITAL_COMMUNITY): Payer: Self-pay | Admitting: Cardiology

## 2024-01-24 MED FILL — Midazolam HCl Inj PF 2 MG/2ML (Base Equivalent): INTRAMUSCULAR | Qty: 4 | Status: AC

## 2024-01-25 ENCOUNTER — Telehealth: Payer: Self-pay | Admitting: Cardiology

## 2024-01-25 ENCOUNTER — Encounter: Admitting: Physician Assistant

## 2024-01-25 NOTE — Telephone Encounter (Signed)
 Patient has new ICD implant by Dr. Inocencio 01/23/24. Patient reports of swelling to left arm/back area since coming home from hospital. Denies any increase in size. Patient denies any redness to left arm, warmth or any numbness/tingling.   Device clinic apt made 01/26/24 @ 8:40 am. Location reviewed w. Pt also. Patient advised if swelling increases/tingling or numbness starts to go to ER. Patient voiced understanding and appreciative of call.

## 2024-01-25 NOTE — Telephone Encounter (Signed)
 Routing to Dr. Inocencio as rick.

## 2024-01-25 NOTE — Telephone Encounter (Signed)
 Spoke with pt she states she has burning at wound site and she has take percocet and tylenol  and nothing has helped. Pt has sent in transmission

## 2024-01-25 NOTE — Telephone Encounter (Signed)
 Patient called in to say that her left arm and side is swollen. Also, states that her area of the pacemaker has been burning. Calling to see if that normal and if not what she needs to do. Please advise

## 2024-01-26 ENCOUNTER — Ambulatory Visit: Attending: Cardiology

## 2024-01-26 DIAGNOSIS — I5022 Chronic systolic (congestive) heart failure: Secondary | ICD-10-CM

## 2024-01-26 NOTE — Patient Instructions (Addendum)
 Take acetaminophen  1000 mg by mouth every 6 hours for two days.  Use a cool compress at your incision site, taking care not to have any moisture on that area.  Use a stress ball to perform left hand exercises to encourage blood flow in that arm.  You will get a follow up phone call next Monday January 30, 2024 to determine if you need any further follow up.  Please call the Device Clinic at [phone] if you have increasing pain or swelling at your incision site or in your arm.

## 2024-01-26 NOTE — Telephone Encounter (Signed)
 Device clinic apt 01/26/24 @ 8:40 AM

## 2024-01-26 NOTE — Progress Notes (Signed)
 Pt seen in device clinic to evaluate Pt report of left arm swelling and burning at incision site.  Pt evaluated with Charlies Arthur PA.  Incision site is healing WNL.  No abnormal swelling or signs/symptoms of infection.  Left arm is not overtly swollen.  May be slight swelling noted.  Pt advised to use a stress ball in her left hand to encourage blood flow.  Pt unable to elevate arm at this point post implant (day 3 post implant).  Advised Pt that discomfort at this point in her recovery is normal.  Encouraged Pt to take acetaminophen  scheduled for 2 days.  Then can change to taking as needed.  Also advised Pt ok to use cool compress to site for discomfort.  Cautioned not to get site wet.  This nurse will contact Pt next Monday to ensure discomfort is improving.  Will follow up based on results of Monday call.  Will plan to see next Tuesday in device clinic if needed.

## 2024-01-31 NOTE — Progress Notes (Addendum)
 [name]                                          MRN: [id number]   05/04/2024   The [facility] Quality Team Specialist reviewed this patient medical record for the purposes of chart review for care gap closure. The following were reviewed: chart review for care gap closure-diabetic eye exam, glycemic status assessment, and kidney health evaluation for diabetes:eGFR  and uACR.    [facility] Quality Team

## 2024-02-02 ENCOUNTER — Encounter: Payer: Self-pay | Admitting: *Deleted

## 2024-02-02 ENCOUNTER — Telehealth: Payer: Self-pay | Admitting: Cardiology

## 2024-02-02 NOTE — Telephone Encounter (Signed)
 Called pt to discuss. Made aware will update letter to reflect wound check appt that is now scheduled (was not scheduled at the time the letter was completed on 9/29).  Patient appreciative.

## 2024-02-02 NOTE — Telephone Encounter (Signed)
 Pt was calling concerning a letter that was discussed with RN Maeola and would like a c/b please advise

## 2024-02-02 NOTE — Telephone Encounter (Signed)
 Pt aware will send an updated letter that reflects her wound check date on 10/28  (original instruction letter completed on 9/29, prior to wound check appt being scheduled). She appreciates the help with this.

## 2024-02-07 ENCOUNTER — Ambulatory Visit: Attending: Internal Medicine

## 2024-02-07 DIAGNOSIS — I428 Other cardiomyopathies: Secondary | ICD-10-CM

## 2024-02-07 DIAGNOSIS — I5022 Chronic systolic (congestive) heart failure: Secondary | ICD-10-CM | POA: Diagnosis not present

## 2024-02-07 NOTE — Patient Instructions (Signed)
   After Your ICD (Implantable Cardiac Defibrillator)    Monitor your defibrillator site for redness, swelling, and drainage. Call the device clinic at [phone] if you experience these symptoms or fever/chills.  Your incision was closed with Steri-strips or staples:  You may shower 7 days after your procedure and wash your incision with soap and water. Avoid lotions, ointments, or perfumes over your incision until it is well-healed.  You may use a hot tub or a pool after your wound check appointment if the incision is completely closed.  Do not lift, push or pull greater than 10 pounds with the affected arm until 6 weeks after your procedure.  UNTIL AFTER NOVEMBER 24TH.     There are no other restrictions in arm movement after your wound check appointment.  Your ICD is designed to protect you from life threatening heart rhythms. Because of this, you may receive a shock.   1 shock with no symptoms:  Call the office during business hours. 1 shock with symptoms (chest pain, chest pressure, dizziness, lightheadedness, shortness of breath, overall feeling unwell):  Call 911. If you experience 2 or more shocks in 24 hours:  Call 911. If you receive a shock, you should not drive.  [place] DMV - no driving for 6 months if you receive appropriate therapy from your ICD.   ICD Alerts:  Some alerts are vibratory and others beep. These are NOT emergencies. Please call our office to let us  know. If this occurs at night or on weekends, it can wait until the next business day. Send a remote transmission.  If your device is capable of reading fluid status (for heart failure), you will be offered monthly monitoring to review this with you.   Remote monitoring is used to monitor your ICD from home. This monitoring is scheduled every 91 days by our office. It allows us  to keep an eye on the functioning of your device to ensure it is working properly. You will routinely see your Electrophysiologist annually  (more often if necessary).

## 2024-02-08 LAB — CUP PACEART INCLINIC DEVICE CHECK
Date Time Interrogation Session: 20251029145054
Implantable Lead Connection Status: 753985
Implantable Lead Implant Date: 20251013
Implantable Lead Location: 753860
Implantable Pulse Generator Implant Date: 20251013

## 2024-02-08 NOTE — Progress Notes (Addendum)
 Normal SINGLE chamber ICD wound check.  There is 1 small stitch present at proximal end of incision line.  Stitch pulled and clipped, wound care instructions given, no signs of infection.  Wound edges well approximated and healing within normal limites.   Presenting rhythm: VS 90. Routine testing performed. Presenting rhythm: VS 90. Routine testing performed. Thresholds, sensing, and impedance consistent with implant measurements with 3.5V safety margin until 3 month visit. No treated arrhythmias. Reviewed arm restrictions to continue for 6 weeks total post op. Reviewed shock plan and alert tones.  Pt enrolled in remote follow-up.

## 2024-02-10 ENCOUNTER — Ambulatory Visit: Payer: Self-pay | Admitting: Cardiology

## 2024-02-20 ENCOUNTER — Encounter: Admitting: Cardiology

## 2024-02-23 ENCOUNTER — Telehealth (HOSPITAL_COMMUNITY): Payer: Self-pay

## 2024-02-23 NOTE — Telephone Encounter (Signed)
 Called to confirm/remind patient of their appointment at the [facility] on 02/24/24.   Appointment:   [] Confirmed  [] Left mess   [] No answer/No voice mail  [x] VM Full/unable to leave message  [] Phone not in service

## 2024-02-24 ENCOUNTER — Ambulatory Visit (HOSPITAL_COMMUNITY)
Admission: RE | Admit: 2024-02-24 | Discharge: 2024-02-24 | Disposition: A | Discharge status: Home / Self Care | Source: Ambulatory Visit | Attending: Family Medicine | Admitting: Family Medicine

## 2024-02-24 ENCOUNTER — Encounter (HOSPITAL_COMMUNITY): Payer: Self-pay

## 2024-02-24 VITALS — BP 118/82 | HR 89 | Ht 62.0 in | Wt 224.0 lb

## 2024-02-24 DIAGNOSIS — E119 Type 2 diabetes mellitus without complications: Secondary | ICD-10-CM | POA: Diagnosis not present

## 2024-02-24 DIAGNOSIS — Z95810 Presence of automatic (implantable) cardiac defibrillator: Secondary | ICD-10-CM | POA: Insufficient documentation

## 2024-02-24 DIAGNOSIS — I5022 Chronic systolic (congestive) heart failure: Secondary | ICD-10-CM | POA: Insufficient documentation

## 2024-02-24 DIAGNOSIS — Z6841 Body Mass Index (BMI) 40.0 and over, adult: Secondary | ICD-10-CM | POA: Insufficient documentation

## 2024-02-24 DIAGNOSIS — M05731 Rheumatoid arthritis with rheumatoid factor of right wrist without organ or systems involvement: Secondary | ICD-10-CM | POA: Diagnosis not present

## 2024-02-24 DIAGNOSIS — I110 Hypertensive heart disease with heart failure: Secondary | ICD-10-CM | POA: Diagnosis not present

## 2024-02-24 DIAGNOSIS — E669 Obesity, unspecified: Secondary | ICD-10-CM | POA: Insufficient documentation

## 2024-02-24 DIAGNOSIS — M069 Rheumatoid arthritis, unspecified: Secondary | ICD-10-CM | POA: Insufficient documentation

## 2024-02-24 DIAGNOSIS — R002 Palpitations: Secondary | ICD-10-CM

## 2024-02-24 DIAGNOSIS — Z79899 Other long term (current) drug therapy: Secondary | ICD-10-CM | POA: Insufficient documentation

## 2024-02-24 DIAGNOSIS — I429 Cardiomyopathy, unspecified: Secondary | ICD-10-CM | POA: Diagnosis not present

## 2024-02-24 DIAGNOSIS — Z7985 Long-term (current) use of injectable non-insulin antidiabetic drugs: Secondary | ICD-10-CM | POA: Insufficient documentation

## 2024-02-24 DIAGNOSIS — M05732 Rheumatoid arthritis with rheumatoid factor of left wrist without organ or systems involvement: Secondary | ICD-10-CM

## 2024-02-24 NOTE — Patient Instructions (Addendum)
Good to see you today!  Your physician has requested that you have an echocardiogram. Echocardiography is a painless test that uses sound waves to create images of your heart. It provides your doctor with information about the size and shape of your heart and how well your heart's chambers and valves are working. This procedure takes approximately one hour. There are no restrictions for this procedure. Please do NOT wear cologne, perfume, aftershave, or lotions (deodorant is allowed). Please arrive 15 minutes prior to your appointment time.  Please note: We ask at that you not bring children with you during ultrasound (echo/ vascular) testing. Due to room size and safety concerns, children are not allowed in the ultrasound rooms during exams. Our front office staff cannot provide observation of children in our lobby area while testing is being conducted. An adult accompanying a patient to their appointment will only be allowed in the ultrasound room at the discretion of the ultrasound technician under special circumstances. We apologize for any inconvenience.  Your physician recommends that you schedule a follow-up appointment 4 months with echocardiogram(March) Call office in January to schedule an appointment  If you have any questions or concerns before your next appointment please send us  a message through [place] or call our office at [phone].    TO LEAVE A MESSAGE FOR THE NURSE SELECT OPTION 2, PLEASE LEAVE A MESSAGE INCLUDING: YOUR NAME DATE OF BIRTH CALL BACK NUMBER REASON FOR CALL**this is important as we prioritize the call backs  YOU WILL RECEIVE A CALL BACK THE SAME DAY AS LONG AS YOU CALL BEFORE 4:00 PM At the [facility], you and your health needs are our priority. As part of our continuing mission to provide you with exceptional heart care, we have created designated Provider Care Teams. These Care Teams include your primary Cardiologist (physician) and  Advanced Practice Providers (APPs- Physician Assistants and Nurse Practitioners) who all work together to provide you with the care you need, when you need it.   You may see any of the following providers on your designated Care Team at your next follow up: Dr Toribio Fuel Dr Ezra Shuck Dr. Morene Brownie Greig Mosses, NP Caffie Shed, [place] [facility] [place], [place] Beckey Coe, NP Fleury Lee, NP Ellouise Class, NP Tinnie Redman, PharmD Jaun Bash, PharmD   Please be sure to bring in all your medications bottles to every appointment.    Thank you for choosing [facility]

## 2024-02-24 NOTE — Addendum Note (Signed)
 Encounter addended by: Glena Harlene HERO, FNP on: 02/24/2024 1:28 PM  Actions taken: Clinical Note Signed

## 2024-02-24 NOTE — Progress Notes (Addendum)
 PCP: Dr. Dyane Cardiology: Dr. Verlin HF Cardiology: Dr. Rolan  Chief complaint: CHF  53 y.o. with history of nonischemic cardiomyopathy/chronic systolic CHF and rheumatoid arthritis.   Patient has had a cardiomyopathy known for a number of years now.  Coronary angiography in 2012 showed no significant disease.  EF has been in the 25-30% range long-term.  Most recent echo in 8/18 showed EF remains 25-30%.  She has no family history of cardiomyopathy and she has never been a heavy drinker.  She has not used illicit drugs.  RHC in 10/18 showed preserved cardiac output with mildly increased filling pressures.  Lasix  was increased to 120 qam/80 qpm.  She was seen by EP (Camnitz) and decided against ICD.    Echo in 3/23 showed EF lower at 30-35%, mild-moderate LV dilation, normal RV.  Echo in 12/23 showed EF 25-30%, global hypokinesis, LV mildly dilated, mildly decreased RV systolic function, IVC normal. Echo in 5/24 showed EF 30-35%, normal RV, normal IVC.    06/2023 Echo EF 25-30%. She was referred to EP for ICD, plan for placement in 7/25.     Zio 6/25: 2 short NSVT runs and 3 SVTs.  Today she returns for HF follow up. Overall feeling fine. No SOB with activity. Rare dizziness, no syncope or falls. Denies palpitations, abnormal bleeding, CP, dizziness, edema, or PND/Orthopnea. Appetite ok. Not weighing at home. Taking all medications. She works at [employer].  Device interrogation (personally reviewed): no OptiVol data yet, 4.1 hr/day activity, no AF or VT  ECG (personally reviewed): none ordered today   Labs (4/25): BNP 160, K 3.1, creatinine 0.76 Labs (9/25): K 4.1, creatinine 0.82 Labs (11/25): K 3.3, creatinine 0.75  PMH: 1. Rheumatoid arthritis: Followed by Dr. Lavell.  2. Type II diabetes.  3. HTN 4. Chronic systolic CHF: Nonischemic cardiomyopathy.  - LHC (2012): No significant CAD.  - Echo (7/13): EF 30-35% - Cardiac MRI (12/14): Mild LV dilation, EF 40%, normal RV size and  systolic function, no late gadolinium enhancement.  - Echo (7/16): EF 25-30% - Echo (8/18): EF 25-30% - RHC (10/18): mean RA 11, PA 47/20 mean 32, mean PCWP 16, CI 3.06 Fick/3.5 thermo, PVR 2.65 WU.  - Echo (10/19): EF 40-45%, diffuse hypokinesis, moderate diastolic dysfunction, normal RV size and systolic function  - Echo (2/21): EF 40-45%, diffuse hypokinesis.  - Echo (2/23): EF lower at 30-35%, mild-moderate LV dilation, normal RV.  - Echo (12/23): EF 25-30%, global hypokinesis, LV mildly dilated, mildly decreased RV systolic function, IVC normal.  - Echo (5/24): EF 30-35%, normal RV, normal IVC. - Echo (3/25):  EF 25-30%. Referred to EP for ICD.    SH: Lives alone.  Rare ETOH, no drugs.  No smoking.   Family History  Problem Relation Age of Onset   Heart attack Mother    CAD Mother    Allergies Mother    Breast cancer Mother    Hypertension Other    Diabetes Mellitus II Other    ROS: All systems reviewed and negative except as per HPI.   Current Outpatient Medications  Medication Sig Dispense Refill   albuterol  (PROVENTIL ) (2.5 MG/3ML) 0.083% nebulizer solution Take 2.5 mg by nebulization every 6 (six) hours as needed for wheezing or shortness of breath.     albuterol  (VENTOLIN  HFA) 108 (90 Base) MCG/ACT inhaler Inhale 2 puffs into the lungs every 6 (six) hours as needed for wheezing or shortness of breath.     benzonatate  (TESSALON ) 100 MG capsule Take 1  capsule (100 mg total) by mouth as needed for cough. 20 capsule 1   betamethasone dipropionate (DIPROLENE) 0.05 % ointment As needed     cholecalciferol (VITAMIN D3) 25 MCG (1000 UNIT) tablet Take 25 mcg by mouth daily.     cyclobenzaprine  (FLEXERIL ) 10 MG tablet Take 1 tablet (10 mg total) by mouth at bedtime as needed for muscle spasms. 8 tablet 0   fluconazole  (DIFLUCAN ) 150 MG tablet Take 1 tablet (150 mg total) by mouth daily. 1 tablet 0   Fluocinolone Acetonide Scalp 0.01 % OIL daily as needed.     fluticasone (FLONASE)  50 MCG/ACT nasal spray Place 2 sprays into both nostrils daily as needed.     folic acid  (FOLVITE ) 1 MG tablet Take 1 mg by mouth daily.     furosemide  (LASIX ) 40 MG tablet Take 1 tablet (40 mg total) by mouth daily. 90 tablet 3   gabapentin (NEURONTIN) 600 MG tablet Take 600 mg by mouth daily as needed.     isosorbide -hydrALAZINE  (BIDIL ) 20-37.5 MG tablet Take 2 tablets by mouth 3 (three) times daily. 540 tablet 3   levocetirizine (XYZAL ) 5 MG tablet Take 5 mg by mouth at bedtime.     meloxicam (MOBIC) 15 MG tablet Take 15 mg by mouth daily as needed.     metoprolol  succinate (TOPROL -XL) 100 MG 24 hr tablet Take 1 tablet (100 mg total) by mouth in the morning and at bedtime. Take with or immediately following a meal. 180 tablet 3   minoxidil (LONITEN) 2.5 MG tablet Take 2.5 mg by mouth daily.     montelukast (SINGULAIR) 10 MG tablet Take 10 mg by mouth every morning.     MOUNJARO 5 MG/0.5ML Pen Inject 5 mg into the skin once a week. On Wednesdays     omeprazole  (PRILOSEC) 40 MG capsule Take 1 capsule (40 mg total) by mouth daily before breakfast. 30 capsule 0   potassium chloride  SA (KLOR-CON  M) 10 MEQ tablet Take 4 tablets (40 mEq total) by mouth daily. 120 tablet 6   sacubitril -valsartan  (ENTRESTO ) 97-103 MG Take 1 tablet by mouth 2 (two) times daily. 180 tablet 3   spironolactone  (ALDACTONE ) 25 MG tablet Take 1 tablet (25 mg total) by mouth daily. 90 tablet 3   SYMBICORT  160-4.5 MCG/ACT inhaler Inhale 2 puffs into the lungs daily as needed (for respiratory issues.).      traZODone (DESYREL) 50 MG tablet Take 50 mg by mouth at bedtime.     valACYclovir  (VALTREX ) 500 MG tablet Take 500 mg by mouth 2 (two) times daily as needed (for cold sores.).     ENBREL 50 MG/ML injection Inject 50 mg into the skin once a week.     No current facility-administered medications for this encounter.   Wt Readings from Last 3 Encounters:  02/24/24 101.6 kg (224 lb)  01/23/24 98.9 kg (218 lb)  12/01/23 99.8  kg (220 lb)   BP 118/82   Pulse 89   Ht 5' 2 (1.575 m)   Wt 101.6 kg (224 lb)   LMP 08/10/2019 (Exact Date)   SpO2 98%   BMI 40.97 kg/m   Physical Exam General:  NAD. No resp difficulty, walked into clinic HEENT: Normal Neck: Supple. No JVD. Cor: Regular rate & rhythm. No rubs, gallops or murmurs. Lungs: Clear Abdomen: Soft, nontender, nondistended.  Extremities: No cyanosis, clubbing, rash, edema Neuro: Alert & oriented x 3, moves all 4 extremities w/o difficulty. Affect pleasant.  Assessment/Plan: 1. Chronic systolic CHF:  Nonischemic cardiomyopathy, cath showed no significant coronary disease at time of diagnosis.  Etiology uncertain, possible prior viral myocarditis.  CMRI in 12/14 with no LGE noted.  No heavy drinking or drugs.  No history of familial cardiomyopathy.  Echo in 2/21 showed stable EF 40-45%.  Echo in 3/23 with EF lower at 30-35%, mild-moderate LV dilation, normal RV.  Echo in 12/23 showed EF 25-30%, global hypokinesis, LV mildly dilated, mildly decreased RV systolic function, IVC normal. Echo in 3/25 with EF 25-30%. S/p ICD 10/25. NYHA class II, not volume overloaded on exam.  - Continue Lasix  40 mg daily. Had labs with PCP on 02/20/24, K 3.3, creatinine 0.75; instructed to increase K-rich foods in diet. - Continue spironolactone  25 mg daily.   - Continue Toprol  XL 100 mg bid.   - Continue Bidil  2 tabs tid.   - Continue Entresto  97/103 bid.    - She is off Farxiga  due to multiple yeast infections.  - Update echo next visit. 2. Rheumatoid arthritis: She sees a rheumatologist, now on Enbrel.  3. Obesity: Body mass index is 40.97 kg/m. - Continue Mounjaro. 4. Palpitations: No worrisome lightheadedness or syncope.  - Zio 6/25: 2 short NSVT runs and 3 SVTs. - resolved.   Follow up in 4 months with Dr. Rolan + echo.  Harlene HERO [facility] FNP-BC 02/24/2024

## 2024-02-29 ENCOUNTER — Other Ambulatory Visit (HOSPITAL_COMMUNITY): Payer: Self-pay | Admitting: Cardiology

## 2024-02-29 ENCOUNTER — Ambulatory Visit (HOSPITAL_COMMUNITY): Payer: Self-pay | Admitting: Cardiology

## 2024-03-02 ENCOUNTER — Telehealth (HOSPITAL_COMMUNITY): Payer: Self-pay | Admitting: Cardiology

## 2024-03-02 NOTE — Telephone Encounter (Signed)
 Patient called to report increase in SOB of the past few days  No weights No swelling No missed doses of medication Reports compliance with sodium and fluid restrictions    Reports SOB is at rest and with exertion-unsure where all the SOB is coming from   Please advise

## 2024-03-02 NOTE — Telephone Encounter (Signed)
   I called Ms Loewe.   Complaining of shortness of breath.No fever or chills. She just had PCP visit and she forgot to mention it PCP.     I suspect she has some fluid related to her diet. She was instructed  to take extra 40 mg of lasix  today and tomorrow. If now improvement she was asked to call back next week.   Rocket Gunderson NP-C  10:14 AM

## 2024-03-07 ENCOUNTER — Ambulatory Visit: Attending: Cardiology

## 2024-03-07 DIAGNOSIS — I5022 Chronic systolic (congestive) heart failure: Secondary | ICD-10-CM | POA: Diagnosis not present

## 2024-03-09 LAB — CUP PACEART REMOTE DEVICE CHECK
Battery Remaining Longevity: 171 mo
Battery Voltage: 3.17 V
Brady Statistic RA Percent Paced: INVALID
Brady Statistic RV Percent Paced: 0.01 %
Date Time Interrogation Session: 20251125225933
HighPow Impedance: 54 Ohm
Implantable Lead Connection Status: 753985
Implantable Lead Implant Date: 20251013
Implantable Lead Location: 753860
Implantable Pulse Generator Implant Date: 20251013
Lead Channel Impedance Value: 304 Ohm
Lead Channel Impedance Value: 380 Ohm
Lead Channel Pacing Threshold Amplitude: 0.75 V
Lead Channel Pacing Threshold Pulse Width: 0.4 ms
Lead Channel Sensing Intrinsic Amplitude: 15.5 mV
Lead Channel Setting Pacing Amplitude: 2 V
Lead Channel Setting Pacing Pulse Width: 0.4 ms
Lead Channel Setting Sensing Sensitivity: 0.3 mV
Zone Setting Status: 755011

## 2024-03-12 ENCOUNTER — Telehealth (HOSPITAL_COMMUNITY): Payer: Self-pay | Admitting: *Deleted

## 2024-03-12 ENCOUNTER — Encounter (HOSPITAL_COMMUNITY): Payer: Self-pay

## 2024-03-12 ENCOUNTER — Ambulatory Visit (HOSPITAL_COMMUNITY)
Admission: RE | Admit: 2024-03-12 | Discharge: 2024-03-12 | Disposition: A | Source: Ambulatory Visit | Attending: Family Medicine

## 2024-03-12 ENCOUNTER — Ambulatory Visit (HOSPITAL_COMMUNITY): Payer: Self-pay | Admitting: Family Medicine

## 2024-03-12 VITALS — BP 130/90 | HR 99 | Wt 219.6 lb

## 2024-03-12 DIAGNOSIS — R002 Palpitations: Secondary | ICD-10-CM | POA: Diagnosis not present

## 2024-03-12 DIAGNOSIS — R9431 Abnormal electrocardiogram [ECG] [EKG]: Secondary | ICD-10-CM | POA: Diagnosis not present

## 2024-03-12 DIAGNOSIS — I110 Hypertensive heart disease with heart failure: Secondary | ICD-10-CM | POA: Insufficient documentation

## 2024-03-12 DIAGNOSIS — R0609 Other forms of dyspnea: Secondary | ICD-10-CM | POA: Insufficient documentation

## 2024-03-12 DIAGNOSIS — Z6841 Body Mass Index (BMI) 40.0 and over, adult: Secondary | ICD-10-CM | POA: Insufficient documentation

## 2024-03-12 DIAGNOSIS — Z791 Long term (current) use of non-steroidal anti-inflammatories (NSAID): Secondary | ICD-10-CM | POA: Diagnosis not present

## 2024-03-12 DIAGNOSIS — I5022 Chronic systolic (congestive) heart failure: Secondary | ICD-10-CM | POA: Diagnosis present

## 2024-03-12 DIAGNOSIS — E669 Obesity, unspecified: Secondary | ICD-10-CM | POA: Diagnosis not present

## 2024-03-12 DIAGNOSIS — Z4502 Encounter for adjustment and management of automatic implantable cardiac defibrillator: Secondary | ICD-10-CM | POA: Diagnosis not present

## 2024-03-12 DIAGNOSIS — M069 Rheumatoid arthritis, unspecified: Secondary | ICD-10-CM | POA: Diagnosis not present

## 2024-03-12 DIAGNOSIS — Z79899 Other long term (current) drug therapy: Secondary | ICD-10-CM | POA: Diagnosis not present

## 2024-03-12 DIAGNOSIS — M05731 Rheumatoid arthritis with rheumatoid factor of right wrist without organ or systems involvement: Secondary | ICD-10-CM

## 2024-03-12 DIAGNOSIS — I428 Other cardiomyopathies: Secondary | ICD-10-CM | POA: Insufficient documentation

## 2024-03-12 LAB — BASIC METABOLIC PANEL WITH GFR
Anion gap: 14 (ref 5–15)
BUN: 19 mg/dL (ref 6–20)
CO2: 27 mmol/L (ref 22–32)
Calcium: 8.6 mg/dL — ABNORMAL LOW (ref 8.9–10.3)
Chloride: 99 mmol/L (ref 98–111)
Creatinine, Ser: 0.8 mg/dL (ref 0.44–1.00)
GFR, Estimated: >60 mL/min (ref >60)
Glucose, Bld: 117 mg/dL — ABNORMAL HIGH (ref 70–99)
Potassium: 3.5 mmol/L (ref 3.5–5.1)
Sodium: 140 mmol/L (ref 135–145)

## 2024-03-12 LAB — BRAIN NATRIURETIC PEPTIDE: B Natriuretic Peptide: 387.5 pg/mL — ABNORMAL HIGH (ref 0.0–100.0)

## 2024-03-12 MED ORDER — FUROSEMIDE 40 MG PO TABS: 80.0000 mg | ORAL_TABLET | Freq: Every day | ORAL | 3 refills | Status: AC

## 2024-03-12 MED ORDER — POTASSIUM CHLORIDE CRYS ER 10 MEQ PO TBCR: 40.0000 meq | EXTENDED_RELEASE_TABLET | Freq: Every day | ORAL | 11 refills | Status: AC

## 2024-03-12 MED ORDER — POTASSIUM CHLORIDE CRYS ER 10 MEQ PO TBCR: 40.0000 meq | EXTENDED_RELEASE_TABLET | Freq: Two times a day (BID) | ORAL | 3 refills | Status: DC

## 2024-03-12 NOTE — Telephone Encounter (Signed)
 Patient returned call for lab results and instructions per Harlene Gainer, NP:  BNP elevated, diuretics adjusted at visit.  Change KCL to 40 daily (not bid). OK to take extra Lasix  40 mg PRN  Pt verbalized understanding of above.

## 2024-03-12 NOTE — Progress Notes (Addendum)
 PCP: Dr. Dyane Cardiology: Dr. Verlin HF Cardiology: Dr. Rolan  Chief complaint: CHF  53 y.o. with history of nonischemic cardiomyopathy/chronic systolic CHF and rheumatoid arthritis.   Patient has had a cardiomyopathy known for a number of years now.  Coronary angiography in 2012 showed no significant disease.  EF has been in the 25-30% range long-term.  Most recent echo in 8/18 showed EF remains 25-30%.  She has no family history of cardiomyopathy and she has never been a heavy drinker.  She has not used illicit drugs.  RHC in 10/18 showed preserved cardiac output with mildly increased filling pressures.  Lasix  was increased to 120 qam/80 qpm.  She was seen by EP (Camnitz) and decided against ICD.    Echo in 3/23 showed EF lower at 30-35%, mild-moderate LV dilation, normal RV.  Echo in 12/23 showed EF 25-30%, global hypokinesis, LV mildly dilated, mildly decreased RV systolic function, IVC normal. Echo in 5/24 showed EF 30-35%, normal RV, normal IVC.    06/2023 Echo EF 25-30%.   Zio 6/25: 2 short NSVT runs and 3 SVTs.  S/p ICD placement 01/2024  Today she returns for an acute visit with complaints of increased dyspnea and chest flutters. She called clinic last week and was advised to take extra Lasix  until follow up. She has been taking Lasix  40 bid without improvement in dyspnea, not urinating much. She is limited physically by her RA, but she is not SOB walking on flat ground. Feels more dyspnea with palpitations. Denies abnormal bleeding, CP, dizziness, edema, or PND/Orthopnea. Appetite ok. Not weighing at home. Taking all medications. She works at [employer].  ReDs reading: 37 %, abnormal  ECG (personally reviewed): SR 99 bpm  MDT interrogation (personally reviewed): OptiVol ok, no AF or VT, 4.5 hr/day activity, 100% VS  Labs (4/25): BNP 160, K 3.1, creatinine 0.76 Labs (9/25): K 4.1, creatinine 0.82 Labs (11/25): K 3.3, creatinine 0.75  PMH: 1. Rheumatoid arthritis: Followed by  Dr. Lavell.  2. Type II diabetes.  3. HTN 4. Chronic systolic CHF: Nonischemic cardiomyopathy.  - LHC (2012): No significant CAD.  - Echo (7/13): EF 30-35% - Cardiac MRI (12/14): Mild LV dilation, EF 40%, normal RV size and systolic function, no late gadolinium enhancement.  - Echo (7/16): EF 25-30% - Echo (8/18): EF 25-30% - RHC (10/18): mean RA 11, PA 47/20 mean 32, mean PCWP 16, CI 3.06 Fick/3.5 thermo, PVR 2.65 WU.  - Echo (10/19): EF 40-45%, diffuse hypokinesis, moderate diastolic dysfunction, normal RV size and systolic function  - Echo (2/21): EF 40-45%, diffuse hypokinesis.  - Echo (2/23): EF lower at 30-35%, mild-moderate LV dilation, normal RV.  - Echo (12/23): EF 25-30%, global hypokinesis, LV mildly dilated, mildly decreased RV systolic function, IVC normal.  - Echo (5/24): EF 30-35%, normal RV, normal IVC. - Echo (3/25):  EF 25-30%. Referred to EP for ICD.    SH: Lives alone.  Rare ETOH, no drugs.  No smoking.   Family History  Problem Relation Age of Onset   Heart attack Mother    CAD Mother    Allergies Mother    Breast cancer Mother    Hypertension Other    Diabetes Mellitus II Other    ROS: All systems reviewed and negative except as per HPI.   Current Outpatient Medications  Medication Sig Dispense Refill   albuterol  (PROVENTIL ) (2.5 MG/3ML) 0.083% nebulizer solution Take 2.5 mg by nebulization every 6 (six) hours as needed for wheezing or shortness of breath.  albuterol  (VENTOLIN  HFA) 108 (90 Base) MCG/ACT inhaler Inhale 2 puffs into the lungs every 6 (six) hours as needed for wheezing or shortness of breath.     benzonatate  (TESSALON ) 100 MG capsule Take 1 capsule (100 mg total) by mouth as needed for cough. 20 capsule 1   betamethasone dipropionate (DIPROLENE) 0.05 % ointment As needed     cholecalciferol (VITAMIN D3) 25 MCG (1000 UNIT) tablet Take 25 mcg by mouth daily.     cyclobenzaprine  (FLEXERIL ) 10 MG tablet Take 1 tablet (10 mg total) by mouth at  bedtime as needed for muscle spasms. 8 tablet 0   fluconazole  (DIFLUCAN ) 150 MG tablet Take 1 tablet (150 mg total) by mouth daily. 1 tablet 0   Fluocinolone Acetonide Scalp 0.01 % OIL daily as needed.     fluticasone (FLONASE) 50 MCG/ACT nasal spray Place 2 sprays into both nostrils daily as needed.     folic acid  (FOLVITE ) 1 MG tablet Take 1 mg by mouth daily.     gabapentin (NEURONTIN) 600 MG tablet Take 600 mg by mouth daily as needed.     isosorbide -hydrALAZINE  (BIDIL ) 20-37.5 MG tablet Take 2 tablets by mouth 3 (three) times daily. 540 tablet 3   levocetirizine (XYZAL ) 5 MG tablet Take 5 mg by mouth at bedtime.     meloxicam (MOBIC) 15 MG tablet Take 15 mg by mouth daily as needed.     metoprolol  succinate (TOPROL -XL) 100 MG 24 hr tablet Take 1 tablet (100 mg total) by mouth in the morning and at bedtime. Take with or immediately following a meal. 180 tablet 3   minoxidil (LONITEN) 2.5 MG tablet Take 2.5 mg by mouth daily.     montelukast (SINGULAIR) 10 MG tablet Take 10 mg by mouth every morning.     MOUNJARO 5 MG/0.5ML Pen Inject 5 mg into the skin once a week. On Wednesdays     omeprazole  (PRILOSEC) 40 MG capsule Take 1 capsule (40 mg total) by mouth daily before breakfast. 30 capsule 0   potassium chloride  (KLOR-CON  M) 10 MEQ tablet Take 4 tablets (40 mEq total) by mouth 2 (two) times daily. 180 tablet 3   sacubitril -valsartan  (ENTRESTO ) 97-103 MG Take 1 tablet by mouth 2 (two) times daily. 180 tablet 3   spironolactone  (ALDACTONE ) 25 MG tablet Take 1 tablet (25 mg total) by mouth daily. 90 tablet 3   SYMBICORT  160-4.5 MCG/ACT inhaler Inhale 2 puffs into the lungs daily as needed (for respiratory issues.).      traZODone (DESYREL) 50 MG tablet Take 50 mg by mouth at bedtime.     valACYclovir  (VALTREX ) 500 MG tablet Take 500 mg by mouth 2 (two) times daily as needed (for cold sores.).     ENBREL 50 MG/ML injection Inject 50 mg into the skin once a week.     furosemide  (LASIX ) 40 MG  tablet Take 2 tablets (80 mg total) by mouth daily. 180 tablet 3   No current facility-administered medications for this encounter.   Wt Readings from Last 3 Encounters:  03/12/24 99.6 kg (219 lb 9.6 oz)  02/24/24 101.6 kg (224 lb)  01/23/24 98.9 kg (218 lb)   BP (!) 130/90   Pulse 99   Wt 99.6 kg (219 lb 9.6 oz)   LMP 08/10/2019 (Exact Date)   SpO2 99%   BMI 40.17 kg/m   Physical Exam General:  NAD. No resp difficulty HEENT: Normal Neck: Supple. No JVD. Cor: Regular rate & rhythm. No rubs, gallops or  murmurs. Lungs: Clear Abdomen: Soft, nontender, nondistended.  Extremities: No cyanosis, clubbing, rash, edema Neuro: Alert & oriented x 3, moves all 4 extremities w/o difficulty. Affect pleasant.  Assessment/Plan: 1. Chronic systolic CHF: Nonischemic cardiomyopathy, cath showed no significant coronary disease at time of diagnosis.  Etiology uncertain, possible prior viral myocarditis.  CMRI in 12/14 with no LGE noted.  No heavy drinking or drugs.  No history of familial cardiomyopathy.  Echo in 2/21 showed stable EF 40-45%.  Echo in 3/23 with EF lower at 30-35%, mild-moderate LV dilation, normal RV.  Echo in 12/23 showed EF 25-30%, global hypokinesis, LV mildly dilated, mildly decreased RV systolic function, IVC normal. Echo in 3/25 with EF 25-30%. S/p ICD 10/25. NYHA class IIb-early III. She does not appear markedly volume overloaded by exam, REDs mildly elevated at 37%. ? If at least part of symptoms confounded by asthma. - Not urinating briskly on Lasix  40 bid, will change to Lasix  80 mg daily, increase KCL to 40 bid. BMET/BNP today. - Continue spironolactone  25 mg daily.   - Continue Toprol  XL 100 mg bid.   - Continue Bidil  2 tabs tid.   - Continue Entresto  97/103 bid.    - She is off Farxiga  due to multiple yeast infections.  - Update echo next visit. 2. Rheumatoid arthritis: She sees a rheumatologist, now on Enbrel.  3. Obesity: Body mass index is 40.17 kg/m. - Continue  Mounjaro. 4. Palpitations: Zio 6/25: 2 short NSVT runs and 3 SVTs. - No worrisome lightheadedness or syncope. No ectopy on ECG today, no events of device interrogation today.  Follow up in 3 months with Dr. Rolan + echo.  Harlene HERO [facility] FNP-BC 03/12/2024

## 2024-03-12 NOTE — Progress Notes (Signed)
 ReDS Vest / Clip - 03/12/24 1100       ReDS Vest / Clip   Station Marker B    Ruler Value 34    ReDS Value Range Moderate volume overload    ReDS Actual Value 37

## 2024-03-12 NOTE — Patient Instructions (Addendum)
 Thank you for coming in today  If you had labs drawn today, any labs that are abnormal the clinic will call you No news is good news  Medications: Increase Lasix  to 80 mg daily Increase Potassium to 40 meq twice daily  Follow up appointments:  Your physician recommends that you schedule a follow-up appointment in:  3 months with echocardiogram  Please call our office to schedule the follow-up appointment in January 2026 for February 2026.    Do the following things EVERYDAY: Weigh yourself in the morning before breakfast. Write it down and keep it in a log. Take your medicines as prescribed Eat low salt foods--Limit salt (sodium) to 2000 mg per day.  Stay as active as you can everyday Limit all fluids for the day to less than 2 liters   At the [facility], you and your health needs are our priority. As part of our continuing mission to provide you with exceptional heart care, we have created designated Provider Care Teams. These Care Teams include your primary Cardiologist (physician) and Advanced Practice Providers (APPs- Physician Assistants and Nurse Practitioners) who all work together to provide you with the care you need, when you need it.   You may see any of the following providers on your designated Care Team at your next follow up: Dr Toribio Fuel Dr Ezra Shuck Dr. Ria Gardenia Greig Lenetta, NP Caffie Shed, [place] [facility] [place], [place] Beckey Coe, NP Tinnie Redman, PharmD   Please be sure to bring in all your medications bottles to every appointment.    Thank you for choosing [facility]  If you have any questions or concerns before your next appointment please send us  a message through Anton or call our office at [phone].    TO LEAVE A MESSAGE FOR THE NURSE SELECT OPTION 2, PLEASE LEAVE A MESSAGE INCLUDING: YOUR NAME DATE OF BIRTH CALL BACK NUMBER REASON FOR CALL**this  is important as we prioritize the call backs  YOU WILL RECEIVE A CALL BACK THE SAME DAY AS LONG AS YOU CALL BEFORE 4:00 PM

## 2024-03-13 NOTE — Progress Notes (Signed)
 Remote ICD Transmission

## 2024-03-16 ENCOUNTER — Ambulatory Visit: Payer: Self-pay | Admitting: Cardiology

## 2024-03-19 ENCOUNTER — Telehealth: Payer: Self-pay | Admitting: Cardiology

## 2024-03-19 NOTE — Telephone Encounter (Signed)
 Patient wants a call back from RN Sherri P. regarding getting work accommodations extended.

## 2024-03-19 NOTE — Telephone Encounter (Signed)
 Pt reporting continued issues since ICD implant.  Reports that it still stings and burns when lifting straps.  She works at [employer] and they had her lifting straps less than 10 pds.  Says that they still had her under light duty up until now although our instructions released her to return to full duty with no restrictions on 11/25. She is asking for extended accommodations. Aware will forward to MD for advisement, but she has not called to voice her concern/discomfort until now, and she will most likely need to be seen for determination of extended accommodations. Aware if she needs to be seen I am not sure how quickly we could get her into the office for this

## 2024-03-20 NOTE — Telephone Encounter (Signed)
 Left message to call back  (Per Dr. Inocencio - pt will need to be seen before determination can be made)

## 2024-03-20 NOTE — Telephone Encounter (Signed)
 A message was sent to Dr. Inocencio for review yesterday. Will wait for response from provider before moving forward. Spoke with patient and advised. Pt agrees with plan.

## 2024-03-20 NOTE — Telephone Encounter (Signed)
 Patient came to office today needing to know if she can get off light duty.  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  The light duty note expired nov 24th.  Please let pt know as soon as possible her new limits.  Patient request note be sent to mychart and she can get it to her supervisor in a timely manner.

## 2024-03-20 NOTE — Telephone Encounter (Signed)
 Pt scheduled to see Charlies Kluver, PA tomorrow. Pt agreeable to plan.

## 2024-03-21 ENCOUNTER — Ambulatory Visit: Attending: Physician Assistant | Admitting: Physician Assistant

## 2024-03-21 ENCOUNTER — Ambulatory Visit

## 2024-03-21 ENCOUNTER — Ambulatory Visit: Payer: Self-pay | Admitting: Cardiology

## 2024-03-21 VITALS — BP 122/64 | HR 109 | Ht 62.25 in | Wt 217.0 lb

## 2024-03-21 DIAGNOSIS — R002 Palpitations: Secondary | ICD-10-CM

## 2024-03-21 DIAGNOSIS — R0789 Other chest pain: Secondary | ICD-10-CM | POA: Diagnosis not present

## 2024-03-21 DIAGNOSIS — Z95810 Presence of automatic (implantable) cardiac defibrillator: Secondary | ICD-10-CM

## 2024-03-21 LAB — CUP PACEART INCLINIC DEVICE CHECK
Date Time Interrogation Session: 20251210151016
Implantable Lead Connection Status: 753985
Implantable Lead Implant Date: 20251013
Implantable Lead Location: 753860
Implantable Pulse Generator Implant Date: 20251013

## 2024-03-21 NOTE — Patient Instructions (Addendum)
 Medication Instructions:    Your physician recommends that you continue on your current medications as directed. Please refer to the Current Medication list given to you today.   *If you need a refill on your cardiac medications before your next appointment, please call your pharmacy*  Lab Work: NONE ORDERED  TODAY   If you have labs (blood work) drawn today and your tests are completely normal, you will receive your results only by: MyChart Message (if you have MyChart) OR A paper copy in the mail If you have any lab test that is abnormal or we need to change your treatment, we will call you to review the results.   Testing/Procedures: Your physician has recommended that you wear an event monitor. Event monitors are medical devices that record the hearts electrical activity. Doctors most often us  these monitors to diagnose arrhythmias. Arrhythmias are problems with the speed or rhythm of the heartbeat. The monitor is a small, portable device. You can wear one while you do your normal daily activities. This is usually used to diagnose what is causing palpitations/syncope (passing out).    Follow-Up: At [facility], you and your health needs are our priority.  As part of our continuing mission to provide you with exceptional heart care, our providers are all part of one team.  This team includes your primary Cardiologist (physician) and Advanced Practice Providers or APPs (Physician Assistants and Nurse Practitioners) who all work together to provide you with the care you need, when you need it.   Your next appointment: AS SCHEDULED      We recommend signing up for the patient portal called MyChart.  Sign up information is provided on this After Visit Summary.  MyChart is used to connect with patients for Virtual Visits (Telemedicine).  Patients are able to view lab/test results, encounter notes, upcoming appointments, etc.  Non-urgent messages can be sent to your provider as  well.   To learn more about what you can do with MyChart, go to [email or web address].   Other Instructions  ZIO XT- Long Term Monitor Instructions  Your physician has requested you wear a ZIO patch monitor for 7 days.  This is a single patch monitor. [employer] supplies one patch monitor per enrollment. Additional stickers are not available. Please do not apply patch if you will be having a Nuclear Stress Test,  Echocardiogram, Cardiac CT, MRI, or Chest Xray during the period you would be wearing the  monitor. The patch cannot be worn during these tests. You cannot remove and re-apply the  ZIO XT patch monitor.  Your ZIO patch monitor will be mailed 3 day USPS to your address on file. It may take 3-5 days  to receive your monitor after you have been enrolled.  Once you have received your monitor, please review the enclosed instructions. Your monitor  has already been registered assigning a specific monitor serial # to you.  Billing and Patient Assistance Program Information  We have supplied [employer] with any of your insurance information on file for billing purposes. [employer] offers a sliding scale Patient Assistance Program for patients that do not have  insurance, or whose insurance does not completely cover the cost of the ZIO monitor.  You must apply for the Patient Assistance Program to qualify for this discounted rate.  To apply, please call [employer] at [phone], select option 4, select option 2, ask to apply for  Patient Assistance Program. Meredeth will ask your household income, and how many people  are  in your household. They will quote your out-of-pocket cost based on that information.  [employer] will also be able to set up a 34-month, interest-free payment plan if needed.  Applying the monitor   Shave hair from upper left chest.  Hold abrader disc by orange tab. Rub abrader in 40 strokes over the upper left chest as  indicated in your monitor instructions.  Clean area  with 4 enclosed alcohol pads. Let dry.  Apply patch as indicated in monitor instructions. Patch will be placed under collarbone on left  side of chest with arrow pointing upward.  Rub patch adhesive wings for 2 minutes. Remove white label marked 1. Remove the white  label marked 2. Rub patch adhesive wings for 2 additional minutes.  While looking in a mirror, press and release button in center of patch. A small green light will  flash 3-4 times. This will be your only indicator that the monitor has been turned on.  Do not shower for the first 24 hours. You may shower after the first 24 hours.  Press the button if you feel a symptom. You will hear a small click. Record Date, Time and  Symptom in the Patient Logbook.  When you are ready to remove the patch, follow instructions on the last 2 pages of Patient  Logbook. Stick patch monitor onto the last page of Patient Logbook.  Place Patient Logbook in the blue and white box. Use locking tab on box and tape box closed  securely. The blue and white box has prepaid postage on it. Please place it in the mailbox as  soon as possible. Your physician should have your test results approximately 7 days after the  monitor has been mailed back to [facility].  Call [facility] Customer Care at [phone] if you have questions regarding  your ZIO XT patch monitor. Call them immediately if you see an orange light blinking on your  monitor.  If your monitor falls off in less than 4 days, contact our Monitor department at [phone].  If your monitor becomes loose or falls off after 4 days call [employer] at [phone] for  suggestions on securing your monitor

## 2024-03-21 NOTE — Progress Notes (Addendum)
 Cardiology Office Note:  .   Date:  03/21/2024  ID:  [name], DOB January 15, 1971, MRN [id number] PCP: Dyane Anthony RAMAN, FNP  [facility] Providers Cardiologist:  Lonni Cash, MD Electrophysiologist:  Will Gladis Norton, MD  Advanced Heart Failure:  Ezra Shuck, MD {  History of Present Illness: .   [name] is a 53 y.o. female w/PMHx of  RA, DM, HTN NICM, chronic CHF (systolic) ICD  She saw the AHF team 03/12/24, reported some palpitations, flutters, as well as DOE, via prior phone recs advised to take extra lasix . Though did not seem to help much, denied rest SOB, flat ground limitations Palpitations made her SOB She was in SR Etiology of her CM unclear, perhaps prior viral myocarditis She was not felt to be volume OL, wondered if her symptoms were being confounded by her asthma.  Pt reported no clear increase in urine OP with the pulse of lasix  and her 40mg  BID >> 80mg  daily No other med changes made Planned for 3 mo visit with updated echo then   Pt called requesting work restrictions be extended 2/2 burning at her ICD site  Today's visit is scheduled as a work in to evaluate pacer pocket and pt request for light duty work restrictions ROS:   She reports since implant she has had tenderness at the device site, sometimes even her shirt is uncomfortable No CP She is tolerating limited duty, very weary of progressing onward to full duty again. No near syncope or syncope She does report palpitations that are new since her device They are random, can happen anytime at rest or with exertional, most days, generally brief but seem to make her breathing feel short or different. Like a bird fluttering    Device information MDT single chamber ICD implanted 01/23/24   Studies Reviewed: SABRA    EKG done today and reviewed by myself ST105bpm, ST/T changes similar to prior  DEVICE interrogation done today and reviewed by myself Battery and lead  measurements are good No arrhythmias Presenting is ST 100's HR histograms look good (not pulled right)   CMRI in 12/14 with no LGE noted.  Echo in 2/21 showed stable EF 40-45%.  Echo in 3/23 with EF lower at 30-35%, mild-moderate LV dilation, normal RV. Echo in  12/23 showed EF 25-30%, global hypokinesis, LV mildly dilated, mildly decreased RV systolic function, IVC normal.  Echo in 3/25 with EF 25-30%.    Risk Assessment/Calculations:    Physical Exam:   VS:  LMP 08/10/2019 (Exact Date)    Wt Readings from Last 3 Encounters:  03/12/24 219 lb 9.6 oz (99.6 kg)  02/24/24 224 lb (101.6 kg)  01/23/24 218 lb (98.9 kg)    GEN: Well nourished, well developed in no acute distress NECK: No JVD; No carotid bruits CARDIAC: RRR, no murmurs, rubs, gallops RESPIRATORY:  CTA b/l without rales, wheezing or rhonchi  ABDOMEN: Soft, non-tender, non-distended EXTREMITIES: No edema; No deformity   ICD site: looks great, no erythema, edema, no thinning, fluctuation, tethering, no keloid though light touch reported as tender at/around the pocket There is no signs of infection  ASSESSMENT AND PLAN: .    ICD intact function no programming changes made  Post implant restrictions are completed, she has no further strict post implant physical restrictions. I encouraged she slowly work towards her normal activities. She reports she is scared to, worried, suggested that she start slow, talk to her employer about working back into normal duties.  Pocket discomfort, reports sine implant No keloid, site is well healed with no signs of infection Seems more like a skin sensitivity/neuropathy type pain Advised she try topical hydrocortisone cream for a few days/week to see if that helps   2.  Sinus tachycardia Unclear though she did not take her AM meds today (missed Toprol ) Her HR histograms look good Volume status looks good Follow   3. Palpitations New No VT on her device Single chamber  ICD Will have her wear a 1 week monitor   3 mo visit scheduled for January with Brandi  Dispo: as scheduled, sooner if needed  Signed, Charlies Macario Arthur, PA-C

## 2024-03-21 NOTE — Progress Notes (Unsigned)
 Enrolled for [employer] to mail a ZIO XT long term holter monitor to the patients address on file.   Dr. Elberta Fortis to read.

## 2024-03-30 ENCOUNTER — Ambulatory Visit (HOSPITAL_COMMUNITY)
Admission: RE | Admit: 2024-03-30 | Discharge: 2024-03-30 | Disposition: A | Discharge status: Home / Self Care | Source: Ambulatory Visit | Attending: Cardiology | Admitting: Cardiology

## 2024-03-30 ENCOUNTER — Encounter (HOSPITAL_COMMUNITY): Payer: Self-pay | Admitting: Cardiology

## 2024-03-30 VITALS — BP 116/76 | HR 94 | Wt 215.4 lb

## 2024-03-30 DIAGNOSIS — Z6839 Body mass index (BMI) 39.0-39.9, adult: Secondary | ICD-10-CM | POA: Diagnosis not present

## 2024-03-30 DIAGNOSIS — Z833 Family history of diabetes mellitus: Secondary | ICD-10-CM | POA: Diagnosis not present

## 2024-03-30 DIAGNOSIS — Z7951 Long term (current) use of inhaled steroids: Secondary | ICD-10-CM | POA: Diagnosis not present

## 2024-03-30 DIAGNOSIS — I5022 Chronic systolic (congestive) heart failure: Secondary | ICD-10-CM | POA: Insufficient documentation

## 2024-03-30 DIAGNOSIS — I428 Other cardiomyopathies: Secondary | ICD-10-CM | POA: Insufficient documentation

## 2024-03-30 DIAGNOSIS — E669 Obesity, unspecified: Secondary | ICD-10-CM | POA: Insufficient documentation

## 2024-03-30 DIAGNOSIS — M069 Rheumatoid arthritis, unspecified: Secondary | ICD-10-CM | POA: Insufficient documentation

## 2024-03-30 DIAGNOSIS — I110 Hypertensive heart disease with heart failure: Secondary | ICD-10-CM | POA: Insufficient documentation

## 2024-03-30 DIAGNOSIS — Z7985 Long-term (current) use of injectable non-insulin antidiabetic drugs: Secondary | ICD-10-CM | POA: Diagnosis not present

## 2024-03-30 DIAGNOSIS — E119 Type 2 diabetes mellitus without complications: Secondary | ICD-10-CM | POA: Insufficient documentation

## 2024-03-30 DIAGNOSIS — R002 Palpitations: Secondary | ICD-10-CM | POA: Diagnosis not present

## 2024-03-30 DIAGNOSIS — M05731 Rheumatoid arthritis with rheumatoid factor of right wrist without organ or systems involvement: Secondary | ICD-10-CM | POA: Diagnosis not present

## 2024-03-30 DIAGNOSIS — Z95810 Presence of automatic (implantable) cardiac defibrillator: Secondary | ICD-10-CM | POA: Insufficient documentation

## 2024-03-30 DIAGNOSIS — Z79899 Other long term (current) drug therapy: Secondary | ICD-10-CM | POA: Insufficient documentation

## 2024-03-30 DIAGNOSIS — Z8249 Family history of ischemic heart disease and other diseases of the circulatory system: Secondary | ICD-10-CM | POA: Insufficient documentation

## 2024-03-30 DIAGNOSIS — M05732 Rheumatoid arthritis with rheumatoid factor of left wrist without organ or systems involvement: Secondary | ICD-10-CM

## 2024-03-30 NOTE — Progress Notes (Addendum)
 "  ADVANCED HF CLINIC NOTE PCP: Dr. Dyane Cardiology: Dr. Verlin HF Cardiology: Dr. Rolan  HPI: 53 y.o. with history of nonischemic cardiomyopathy/chronic systolic CHF and rheumatoid arthritis.   Patient has had a cardiomyopathy known for a number of years now.  Coronary angiography in 2012 showed no significant disease.  EF has been in the 25-30% range long-term.  Most recent echo in 8/18 showed EF remains 25-30%.  She has no family history of cardiomyopathy and she has never been a heavy drinker.  She has not used illicit drugs.  RHC in 10/18 showed preserved cardiac output with mildly increased filling pressures.  Lasix  was increased to 120 qam/80 qpm.  She was seen by EP (Camnitz) and decided against ICD.    Echo in 3/23 showed EF lower at 30-35%, mild-moderate LV dilation, normal RV.  Echo in 12/23 showed EF 25-30%, global hypokinesis, LV mildly dilated, mildly decreased RV systolic function, IVC normal. Echo in 5/24 showed EF 30-35%, normal RV, normal IVC.    3/25 Echo EF 25-30%.   Zio 6/25: 2 short NSVT runs and 3 SVTs.  S/p ICD placement 10/25.  She returns today for HF follow up. Overall feeling ok. She reports palpitations and fluttering in her chest with associated shortness of breath. Also reports pain at ICD insertion site with strenuous activity. She has a scale at home but does not take her weight. Her lasix  was increased and she has been peeing more. Saw EP, and Zio was ordered, although she reports that she does not know how to place this. Reports compliance with medications, although dispense report on Toprol , Entresto , Spiro and bidil  showed that the last fill date was Feburary of 2025. She is filling her Lasix , mounjaro, and trazodone.  ReDs reading:  29%, normal  MDT interrogation (personally reviewed): OptiVol ok, no AF, VT/VF, 4.5 hr/day activity, <0.1% VP  Labs (4/25): BNP 160, K 3.1, creatinine 0.76 Labs (9/25): K 4.1, creatinine 0.82 Labs (11/25): K 3.3,  creatinine 0.75  PMH: 1. Rheumatoid arthritis: Followed by Dr. Lavell.  2. Type II diabetes.  3. HTN 4. Chronic systolic CHF: Nonischemic cardiomyopathy.  - LHC (2012): No significant CAD.  - Echo (7/13): EF 30-35% - Cardiac MRI (12/14): Mild LV dilation, EF 40%, normal RV size and systolic function, no late gadolinium enhancement.  - Echo (7/16): EF 25-30% - Echo (8/18): EF 25-30% - RHC (10/18): mean RA 11, PA 47/20 mean 32, mean PCWP 16, CI 3.06 Fick/3.5 thermo, PVR 2.65 WU.  - Echo (10/19): EF 40-45%, diffuse hypokinesis, moderate diastolic dysfunction, normal RV size and systolic function  - Echo (2/21): EF 40-45%, diffuse hypokinesis.  - Echo (2/23): EF lower at 30-35%, mild-moderate LV dilation, normal RV.  - Echo (12/23): EF 25-30%, global hypokinesis, LV mildly dilated, mildly decreased RV systolic function, IVC normal.  - Echo (5/24): EF 30-35%, normal RV, normal IVC. - Echo (3/25):  EF 25-30%. Referred to EP for ICD.    SH: Lives alone.  Rare ETOH, no drugs.  No smoking.   Family History  Problem Relation Age of Onset   Heart attack Mother    CAD Mother    Allergies Mother    Breast cancer Mother    Hypertension Other    Diabetes Mellitus II Other    ROS: All systems reviewed and negative except as per HPI.   Current Outpatient Medications  Medication Sig Dispense Refill   Abatacept (ORENCIA) 125 MG/ML SOSY Inject 125 mg into the skin once  a week.     albuterol  (PROVENTIL ) (2.5 MG/3ML) 0.083% nebulizer solution Take 2.5 mg by nebulization every 6 (six) hours as needed for wheezing or shortness of breath.     albuterol  (VENTOLIN  HFA) 108 (90 Base) MCG/ACT inhaler Inhale 2 puffs into the lungs every 6 (six) hours as needed for wheezing or shortness of breath.     benzonatate  (TESSALON ) 100 MG capsule Take 1 capsule (100 mg total) by mouth as needed for cough. 20 capsule 1   betamethasone dipropionate (DIPROLENE) 0.05 % ointment As needed     cholecalciferol (VITAMIN D3)  25 MCG (1000 UNIT) tablet Take 25 mcg by mouth daily.     cyclobenzaprine  (FLEXERIL ) 10 MG tablet Take 1 tablet (10 mg total) by mouth at bedtime as needed for muscle spasms. 8 tablet 0   fluconazole  (DIFLUCAN ) 150 MG tablet Take 1 tablet (150 mg total) by mouth daily. 1 tablet 0   Fluocinolone Acetonide Scalp 0.01 % OIL daily as needed.     fluticasone (FLONASE) 50 MCG/ACT nasal spray Place 2 sprays into both nostrils daily as needed.     folic acid  (FOLVITE ) 1 MG tablet Take 1 mg by mouth daily.     furosemide  (LASIX ) 40 MG tablet Take 2 tablets (80 mg total) by mouth daily. 180 tablet 3   gabapentin (NEURONTIN) 600 MG tablet Take 600 mg by mouth daily as needed.     isosorbide -hydrALAZINE  (BIDIL ) 20-37.5 MG tablet Take 2 tablets by mouth 3 (three) times daily. 540 tablet 3   levocetirizine (XYZAL ) 5 MG tablet Take 5 mg by mouth at bedtime.     meloxicam (MOBIC) 15 MG tablet Take 15 mg by mouth daily as needed.     metoprolol  succinate (TOPROL -XL) 100 MG 24 hr tablet Take 1 tablet (100 mg total) by mouth in the morning and at bedtime. Take with or immediately following a meal. 180 tablet 3   minoxidil (LONITEN) 2.5 MG tablet Take 2.5 mg by mouth daily.     montelukast (SINGULAIR) 10 MG tablet Take 10 mg by mouth every morning.     MOUNJARO 5 MG/0.5ML Pen Inject 5 mg into the skin once a week. On Wednesdays (Patient taking differently: Inject 15 mg into the skin once a week. On Wednesdays)     omeprazole  (PRILOSEC) 40 MG capsule Take 1 capsule (40 mg total) by mouth daily before breakfast. 30 capsule 0   potassium chloride  (KLOR-CON  M) 10 MEQ tablet Take 4 tablets (40 mEq total) by mouth daily. 120 tablet 11   sacubitril -valsartan  (ENTRESTO ) 97-103 MG Take 1 tablet by mouth 2 (two) times daily. 180 tablet 3   spironolactone  (ALDACTONE ) 25 MG tablet Take 1 tablet (25 mg total) by mouth daily. 90 tablet 3   SYMBICORT  160-4.5 MCG/ACT inhaler Inhale 2 puffs into the lungs daily as needed (for  respiratory issues.).      traZODone (DESYREL) 50 MG tablet Take 50 mg by mouth at bedtime.     valACYclovir  (VALTREX ) 500 MG tablet Take 500 mg by mouth 2 (two) times daily as needed (for cold sores.).     ENBREL 50 MG/ML injection Inject 50 mg into the skin once a week.     No current facility-administered medications for this encounter.   Wt Readings from Last 3 Encounters:  03/30/24 97.7 kg (215 lb 6.4 oz)  03/21/24 98.4 kg (217 lb)  03/12/24 99.6 kg (219 lb 9.6 oz)   BP 116/76 (BP Location: Right Arm, Patient Position: Sitting,  Cuff Size: Normal)   Pulse 94   Wt 97.7 kg (215 lb 6.4 oz)   LMP 08/10/2019   SpO2 97%   BMI 39.08 kg/m   Physical Exam:  General: Well appearing. No distress  Cardiac: JVP flat. No murmurs  Extremities: Warm and dry.  No peripheral edema.  Neuro: A&O x3. Affect pleasant.   Assessment/Plan: 1. Chronic systolic CHF: Nonischemic cardiomyopathy, cath showed no significant coronary disease at time of diagnosis.  Etiology uncertain, possible prior viral myocarditis.  CMRI in 12/14 with no LGE noted.  No heavy drinking or drugs.  No history of familial cardiomyopathy.  Echo in 2/21 showed stable EF 40-45%.  Echo in 3/23 with EF lower at 30-35%, mild-moderate LV dilation, normal RV.  Echo in 12/23 showed EF 25-30%, global hypokinesis, LV mildly dilated, mildly decreased RV systolic function, IVC normal. Echo in 3/25 with EF 25-30%. S/p ICD 10/25.  - NYHA class IIb-early III. Appears euvolemic on exam.  - Continue Torsemide 80 mg daily + KCL 40 mEq daily - Has not filled spiro, entresto , bidil , or toprol  since 2/25. Very concerned that if she does one day become compliant, her BP likely won't tolerate these medications. Ordered as spiro 25 mg daily, toprolXL 100 mg bid, bidil  2 tabs tid, entresto  97/103 mg bid.  - She is off Farxiga  due to multiple yeast infections.  - Reports that she had labs drawn with [facility] this week, says that she will bring results to RN  appointment next week to scan in. 2. Rheumatoid arthritis: She sees a rheumatologist. Off Enbrel. 3. Obesity: Body mass index is 39.08 kg/m. - Continue Mounjaro. 4. Palpitations: Zio 6/25: 2 short NSVT runs and 3 SVTs. - No lightheadedness, dizziness, and syncope - Zio ordered per EP, she reports that she is unable to place this - RN visit next week to place Zio patch  Letter given today for lifting maximum at work (15 lbs) for 8 weeks, should no longer need after this.   Follow up in 3 month with Dr. McLean  Asad Keeven, NP 03/30/2024  "

## 2024-03-30 NOTE — Progress Notes (Signed)
"   ReDS Vest / Clip - 03/30/24 1300       ReDS Vest / Clip   Station Marker C    Ruler Value 35    ReDS Value Range Low volume    ReDS Actual Value 29          "

## 2024-03-30 NOTE — Patient Instructions (Signed)
" °  Follow-Up in: next week for nurse visit, and then 3 months with APP as scheduled   At the [facility], you and your health needs are our priority. We have a designated team specialized in the treatment of Heart Failure. This Care Team includes your primary Heart Failure Specialized Cardiologist (physician), Advanced Practice Providers (APPs- Physician Assistants and Nurse Practitioners), and Pharmacist who all work together to provide you with the care you need, when you need it.   You may see any of the following providers on your designated Care Team at your next follow up:  Dr. Toribio Fuel Dr. Ezra Shuck Dr. Odis Brownie Greig Mosses, NP Caffie Shed, [place] [facility] [place], [place] Beckey Coe, NP Briceno Lee, NP Tinnie Redman, PharmD   Please be sure to bring in all your medications bottles to every appointment.   Need to Contact Us :  If you have any questions or concerns before your next appointment please send us  a message through [place] or call our office at [phone].    TO LEAVE A MESSAGE FOR THE NURSE SELECT OPTION 2, PLEASE LEAVE A MESSAGE INCLUDING: YOUR NAME DATE OF BIRTH CALL BACK NUMBER REASON FOR CALL**this is important as we prioritize the call backs  YOU WILL RECEIVE A CALL BACK THE SAME DAY AS LONG AS YOU CALL BEFORE 4:00 PM "

## 2024-04-02 ENCOUNTER — Encounter: Payer: Self-pay | Admitting: Cardiology

## 2024-04-02 ENCOUNTER — Encounter (HOSPITAL_COMMUNITY): Payer: Self-pay | Admitting: Adult Health

## 2024-04-06 ENCOUNTER — Ambulatory Visit (HOSPITAL_COMMUNITY)
Admission: RE | Admit: 2024-04-06 | Discharge: 2024-04-06 | Disposition: A | Discharge status: Home / Self Care | Source: Ambulatory Visit | Attending: Internal Medicine | Admitting: Internal Medicine

## 2024-04-09 ENCOUNTER — Other Ambulatory Visit (HOSPITAL_COMMUNITY): Payer: Self-pay | Admitting: Cardiology

## 2024-04-22 NOTE — Progress Notes (Unsigned)
" °  Electrophysiology Office Note:   Date:  04/24/2024  ID:  Tamasha Meckes Izzo, DOB 1970/07/18, MRN [id number]  Primary Cardiologist: Lonni Cash, MD Primary Heart Failure: Ezra Shuck, MD Electrophysiologist: Will Gladis Norton, MD       History of Present Illness:   [name] is a 54 y.o. female with h/o HFrEF s/p ICD, NICM, HTN, asthma, GERD, DM II, RA, lupus, & obesity seen today for routine electrophysiology follow-up s/p Defibrillator implant.  Since last being seen in our clinic the patient reports she had asked for extended leave previously due to her device. 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 She reports she wore the cardiac monitor and has sent it back.  She notes occasional palpitations and a fullness in her throat at times.  She denies chest pain, dyspnea, PND, orthopnea, nausea, vomiting, dizziness, syncope, edema, weight gain, or early satiety.    Review of systems complete and found to be negative unless listed in HPI.   EP Information / Studies Reviewed:    EKG is not ordered today. EKG from 03/21/24 reviewed which showed ST 105 bpm      ICD Interrogation-  reviewed in detail today,  See PACEART report.  Device History: Medtronic Single Chamber ICD implanted 01/23/24 for HFrEF History of appropriate therapy: No History of AAD therapy: No     Arrhythmia / AAD / Pertinent EP Studies Palpitations  ZIO 03/21/24 > results pending  Risk Assessment/Calculations:              Physical Exam:   VS:  BP 122/76   Pulse 88   Ht 5' 2.25 (1.581 m)   Wt 216 lb (98 kg)   LMP 08/10/2019   SpO2 98%   BMI 39.19 kg/m    Wt Readings from Last 3 Encounters:  04/24/24 216 lb (98 kg)  03/30/24 215 lb 6.4 oz (97.7 kg)  03/21/24 217 lb (98.4 kg)     GEN: Well nourished, well developed in no acute distress NECK: No JVD; No carotid bruits CARDIAC: Regular rate and rhythm, no murmurs, rubs, gallops. Device site  with well healed scar, no edema/erythema  RESPIRATORY:  Clear to auscultation without rales, wheezing or rhonchi  ABDOMEN: Soft, non-tender, non-distended EXTREMITIES:  No edema; No deformity   ASSESSMENT AND PLAN:    Chronic Systolic Dysfunction s/p Medtronic single chamber ICD  NICM  Unclear etiology of NICM, possible viral.  No LGE on cMRI -euvolemic on exam   -Stable on an appropriate medical regimen -Normal ICD function -See Pace Art report -No programming changes   -continue Toprol  100 mg BID -note patient noted fullness in her throat & palpitations (likely AV dyssynchrony with VVI pacing) during device check > this may be what she was feeling when monitor was ordered ?  Palpitations -await monitor results  -Toprol  as above   Disposition:   Follow up with EP APP in 12 months   Signed, Daphne Barrack, NP-C, AGACNP-BC [facility] - Electrophysiology  04/24/2024, 1:00 PM  "

## 2024-04-24 ENCOUNTER — Ambulatory Visit (HOSPITAL_COMMUNITY)
Admission: RE | Admit: 2024-04-24 | Discharge: 2024-04-24 | Disposition: A | Discharge status: Home / Self Care | Source: Ambulatory Visit | Attending: Family Medicine | Admitting: Family Medicine

## 2024-04-24 ENCOUNTER — Encounter: Payer: Self-pay | Admitting: Pulmonary Disease

## 2024-04-24 ENCOUNTER — Ambulatory Visit (INDEPENDENT_AMBULATORY_CARE_PROVIDER_SITE_OTHER): Admitting: Pulmonary Disease

## 2024-04-24 ENCOUNTER — Ambulatory Visit: Payer: Self-pay | Admitting: Cardiology

## 2024-04-24 VITALS — BP 122/76 | HR 88 | Ht 62.25 in | Wt 216.0 lb

## 2024-04-24 DIAGNOSIS — I119 Hypertensive heart disease without heart failure: Secondary | ICD-10-CM | POA: Insufficient documentation

## 2024-04-24 DIAGNOSIS — I340 Nonrheumatic mitral (valve) insufficiency: Secondary | ICD-10-CM | POA: Diagnosis not present

## 2024-04-24 DIAGNOSIS — I5022 Chronic systolic (congestive) heart failure: Secondary | ICD-10-CM

## 2024-04-24 DIAGNOSIS — R002 Palpitations: Secondary | ICD-10-CM

## 2024-04-24 DIAGNOSIS — I428 Other cardiomyopathies: Secondary | ICD-10-CM | POA: Diagnosis not present

## 2024-04-24 DIAGNOSIS — Z95810 Presence of automatic (implantable) cardiac defibrillator: Secondary | ICD-10-CM | POA: Diagnosis not present

## 2024-04-24 LAB — CUP PACEART INCLINIC DEVICE CHECK
Date Time Interrogation Session: 20260113130531
Implantable Lead Connection Status: 753985
Implantable Lead Implant Date: 20251013
Implantable Lead Location: 753860
Implantable Pulse Generator Implant Date: 20251013

## 2024-04-24 NOTE — Patient Instructions (Signed)
 Medication Instructions:  Your physician recommends that you continue on your current medications as directed. Please refer to the Current Medication list given to you today.  *If you need a refill on your cardiac medications before your next appointment, please call your pharmacy*  Lab Work: None ordered If you have labs (blood work) drawn today and your tests are completely normal, you will receive your results only by: MyChart Message (if you have MyChart) OR A paper copy in the mail If you have any lab test that is abnormal or we need to change your treatment, we will call you to review the results.  Follow-Up: At [facility], you and your health needs are our priority.  As part of our continuing mission to provide you with exceptional heart care, our providers are all part of one team.  This team includes your primary Cardiologist (physician) and Advanced Practice Providers or APPs (Physician Assistants and Nurse Practitioners) who all work together to provide you with the care you need, when you need it.  Your next appointment:   1 year(s)  Provider:   Soyla Norton, MD or Daphne Barrack, NP

## 2024-04-26 LAB — ECHOCARDIOGRAM COMPLETE
AR max vel: 1.48 cm2
AV Area VTI: 1.36 cm2
AV Area mean vel: 1.38 cm2
AV Mean grad: 3 mmHg
AV Peak grad: 5.7 mmHg
Ao pk vel: 1.19 m/s
Area-P 1/2: 4.8 cm2
Calc EF: 25.3 %
Est EF: 20
S' Lateral: 5.7 cm
Single Plane A2C EF: 28.3 %
Single Plane A4C EF: 27 %

## 2024-04-27 ENCOUNTER — Ambulatory Visit (HOSPITAL_COMMUNITY): Payer: Self-pay | Admitting: Family Medicine

## 2024-04-27 ENCOUNTER — Other Ambulatory Visit (HOSPITAL_COMMUNITY): Payer: Self-pay

## 2024-04-27 ENCOUNTER — Ambulatory Visit (HOSPITAL_COMMUNITY)
Admission: RE | Admit: 2024-04-27 | Discharge: 2024-04-27 | Disposition: A | Discharge status: Home / Self Care | Source: Ambulatory Visit | Attending: Physician Assistant | Admitting: Physician Assistant

## 2024-04-27 ENCOUNTER — Encounter (HOSPITAL_COMMUNITY): Payer: Self-pay

## 2024-04-27 ENCOUNTER — Encounter: Payer: Self-pay | Admitting: Cardiology

## 2024-04-27 ENCOUNTER — Telehealth: Payer: Self-pay | Admitting: Pharmacy Technician

## 2024-04-27 VITALS — BP 122/80 | HR 106 | Ht 62.5 in | Wt 220.2 lb

## 2024-04-27 DIAGNOSIS — Z7985 Long-term (current) use of injectable non-insulin antidiabetic drugs: Secondary | ICD-10-CM | POA: Diagnosis not present

## 2024-04-27 DIAGNOSIS — E669 Obesity, unspecified: Secondary | ICD-10-CM | POA: Diagnosis not present

## 2024-04-27 DIAGNOSIS — I513 Intracardiac thrombosis, not elsewhere classified: Secondary | ICD-10-CM

## 2024-04-27 DIAGNOSIS — I428 Other cardiomyopathies: Secondary | ICD-10-CM | POA: Diagnosis present

## 2024-04-27 DIAGNOSIS — R002 Palpitations: Secondary | ICD-10-CM | POA: Diagnosis not present

## 2024-04-27 DIAGNOSIS — Z95810 Presence of automatic (implantable) cardiac defibrillator: Secondary | ICD-10-CM | POA: Diagnosis not present

## 2024-04-27 DIAGNOSIS — Z79899 Other long term (current) drug therapy: Secondary | ICD-10-CM | POA: Insufficient documentation

## 2024-04-27 DIAGNOSIS — I5022 Chronic systolic (congestive) heart failure: Secondary | ICD-10-CM | POA: Diagnosis present

## 2024-04-27 DIAGNOSIS — M069 Rheumatoid arthritis, unspecified: Secondary | ICD-10-CM | POA: Diagnosis present

## 2024-04-27 DIAGNOSIS — Z7901 Long term (current) use of anticoagulants: Secondary | ICD-10-CM | POA: Diagnosis not present

## 2024-04-27 DIAGNOSIS — E66812 Obesity, class 2: Secondary | ICD-10-CM | POA: Diagnosis not present

## 2024-04-27 DIAGNOSIS — Z6839 Body mass index (BMI) 39.0-39.9, adult: Secondary | ICD-10-CM | POA: Insufficient documentation

## 2024-04-27 MED ORDER — APIXABAN 5 MG PO TABS: 5.0000 mg | ORAL_TABLET | Freq: Two times a day (BID) | ORAL | 11 refills | Status: AC

## 2024-04-27 NOTE — Telephone Encounter (Signed)
 Ran test claim for eliquis . For a 30 day supply and the co-pay is 0.00 . PA is not needed at this time. This test claim was processed through [facility]- copay amounts may vary at other pharmacies due to pharmacy/plan contracts, or as the patient moves through the different stages of their insurance plan.

## 2024-04-27 NOTE — Progress Notes (Addendum)
 PCP: Dr. Dyane Cardiology: Dr. Verlin HF Cardiology: Dr. Rolan  History of Present Illness 54 y.o. with history of nonischemic cardiomyopathy/chronic systolic CHF and rheumatoid arthritis.   Patient has had a cardiomyopathy known for a number of years now.  Coronary angiography in 2012 showed no significant disease.  EF has been in the 25-30% range long-term.  Most recent echo in 8/18 showed EF remains 25-30%.  She has no family history of cardiomyopathy and she has never been a heavy drinker.  She has not used illicit drugs.  RHC in 10/18 showed preserved cardiac output with mildly increased filling pressures.  Lasix  was increased to 120 qam/80 qpm.  She was seen by EP (Camnitz) and decided against ICD.    Echo in 3/23 showed EF lower at 30-35%, mild-moderate LV dilation, normal RV.  Echo in 12/23 showed EF 25-30%, global hypokinesis, LV mildly dilated, mildly decreased RV systolic function, IVC normal. Echo in 5/24 showed EF 30-35%, normal RV, normal IVC.    06/2023 Echo EF 25-30%.   Zio 6/25: 2 short NSVT runs and 3 SVTs.  S/p ICD placement 01/2024  Echo 04/24/24 which showed EF 20%, possible apical thrombus vs calcified chord, RV mildly reduced.   Here today for a urgent visit. Feels okay but having some trouble walking long distances and carrying heavy loads at work d/t dyspnea and fatigue. She works at an [employer]. Her weight is up 5 lb from last visit. No orthopnea or PND. She is adamant she is taking all medications. Fill history is worrisome.   MDT interrogation (personally reviewed): Optivol trending up last few days, thoracic impedance below threshold, no VT, no AT/AF, activity 4.9 hrs/day  Labs (4/25): BNP 160, K 3.1, creatinine 0.76 Labs (9/25): K 4.1, creatinine 0.82 Labs (11/25): K 3.3, creatinine 0.75 Labs (12/25): creatinine 0.8, K 3.5  PMH: 1. Rheumatoid arthritis: Followed by Dr. Lavell.  2. Type II diabetes.  3. HTN 4. Chronic systolic CHF:  Nonischemic cardiomyopathy.  - LHC (2012): No significant CAD.  - Echo (7/13): EF 30-35% - Cardiac MRI (12/14): Mild LV dilation, EF 40%, normal RV size and systolic function, no late gadolinium enhancement.  - Echo (7/16): EF 25-30% - Echo (8/18): EF 25-30% - RHC (10/18): mean RA 11, PA 47/20 mean 32, mean PCWP 16, CI 3.06 Fick/3.5 thermo, PVR 2.65 WU.  - Echo (10/19): EF 40-45%, diffuse hypokinesis, moderate diastolic dysfunction, normal RV size and systolic function  - Echo (2/21): EF 40-45%, diffuse hypokinesis.  - Echo (2/23): EF lower at 30-35%, mild-moderate LV dilation, normal RV.  - Echo (12/23): EF 25-30%, global hypokinesis, LV mildly dilated, mildly decreased RV systolic function, IVC normal.  - Echo (5/24): EF 30-35%, normal RV, normal IVC. - Echo (3/25):  EF 25-30%.  - MDT ICD placement 10/25 - Echo (12/25): EF 20%, RV mildly reduced, possible LV apical thrombus  SH: Lives alone.  Rare ETOH, no drugs.  No smoking.   Family History  Problem Relation Age of Onset   Heart attack Mother    CAD Mother    Allergies Mother    Breast cancer Mother    Hypertension Other    Diabetes Mellitus II Other    ROS: All systems reviewed and negative except as per HPI.   Current Outpatient Medications  Medication Sig Dispense Refill   Abatacept (ORENCIA) 125 MG/ML SOSY Inject 125 mg into the skin once a week.     albuterol  (PROVENTIL ) (2.5 MG/3ML) 0.083% nebulizer solution Take  2.5 mg by nebulization every 6 (six) hours as needed for wheezing or shortness of breath.     albuterol  (VENTOLIN  HFA) 108 (90 Base) MCG/ACT inhaler Inhale 2 puffs into the lungs every 6 (six) hours as needed for wheezing or shortness of breath.     apixaban  (ELIQUIS ) 5 MG TABS tablet Take 1 tablet (5 mg total) by mouth 2 (two) times daily. 60 tablet 11   benzonatate  (TESSALON ) 100 MG capsule Take 1 capsule (100 mg total) by mouth as needed for cough. 20 capsule 1   betamethasone dipropionate (DIPROLENE) 0.05 %  ointment As needed     cholecalciferol (VITAMIN D3) 25 MCG (1000 UNIT) tablet Take 25 mcg by mouth daily.     cyclobenzaprine  (FLEXERIL ) 10 MG tablet Take 1 tablet (10 mg total) by mouth at bedtime as needed for muscle spasms. 8 tablet 0   ENBREL 50 MG/ML injection Inject 50 mg into the skin once a week.     fluconazole  (DIFLUCAN ) 150 MG tablet Take 1 tablet (150 mg total) by mouth daily. 1 tablet 0   Fluocinolone Acetonide Scalp 0.01 % OIL daily as needed.     fluticasone (FLONASE) 50 MCG/ACT nasal spray Place 2 sprays into both nostrils daily as needed.     folic acid  (FOLVITE ) 1 MG tablet Take 1 mg by mouth daily.     furosemide  (LASIX ) 40 MG tablet Take 2 tablets (80 mg total) by mouth daily. 180 tablet 3   gabapentin (NEURONTIN) 600 MG tablet Take 600 mg by mouth daily as needed.     isosorbide -hydrALAZINE  (BIDIL ) 20-37.5 MG tablet Take 2 tablets by mouth 3 (three) times daily. 540 tablet 3   levocetirizine (XYZAL ) 5 MG tablet Take 5 mg by mouth at bedtime.     meloxicam (MOBIC) 15 MG tablet Take 15 mg by mouth daily as needed.     metoprolol  succinate (TOPROL -XL) 100 MG 24 hr tablet Take 1 tablet (100 mg total) by mouth in the morning and at bedtime. Take with or immediately following a meal. 180 tablet 3   minoxidil (LONITEN) 2.5 MG tablet Take 2.5 mg by mouth daily.     montelukast (SINGULAIR) 10 MG tablet Take 10 mg by mouth every morning.     MOUNJARO 5 MG/0.5ML Pen Inject 5 mg into the skin once a week. On Wednesdays (Patient taking differently: Inject 15 mg into the skin once a week. On Wednesdays)     omeprazole  (PRILOSEC) 40 MG capsule Take 1 capsule (40 mg total) by mouth daily before breakfast. 30 capsule 0   potassium chloride  (KLOR-CON  M) 10 MEQ tablet Take 4 tablets (40 mEq total) by mouth daily. 120 tablet 11   sacubitril -valsartan  (ENTRESTO ) 97-103 MG Take 1 tablet by mouth 2 (two) times daily. 180 tablet 3   spironolactone  (ALDACTONE ) 25 MG tablet Take 1 tablet (25 mg total)  by mouth daily. 90 tablet 3   SYMBICORT  160-4.5 MCG/ACT inhaler Inhale 2 puffs into the lungs daily as needed (for respiratory issues.).      traZODone (DESYREL) 50 MG tablet Take 50 mg by mouth at bedtime.     valACYclovir  (VALTREX ) 500 MG tablet Take 500 mg by mouth 2 (two) times daily as needed (for cold sores.).     No current facility-administered medications for this encounter.   Wt Readings from Last 3 Encounters:  04/27/24 99.9 kg (220 lb 3.2 oz)  04/24/24 98 kg (216 lb)  03/30/24 97.7 kg (215 lb 6.4 oz)  BP 122/80   Pulse (!) 106   Ht 5' 2.5 (1.588 m)   Wt 99.9 kg (220 lb 3.2 oz)   LMP 08/10/2019   SpO2 98%   BMI 39.63 kg/m   General:  Fatigued appearing. Ambulated into clinic. Cor: JVP difficult d/t thick neck. Regular rate & rhythm. No murmurs. Lungs: clear Abdomen: obese, soft, nontender, nondistended. Extremities: no edema Neuro: alert & orientedx3. Affect pleasant   Assessment/Plan: 1. Chronic systolic CHF: Nonischemic cardiomyopathy, cath showed no significant coronary disease at time of diagnosis.  Etiology uncertain, possible prior viral myocarditis.  CMRI in 12/14 with no LGE noted.  No heavy drinking or drugs.  No history of familial cardiomyopathy.  Echo in 2/21 showed stable EF 40-45%.  Echo in 3/23 with EF lower at 30-35%, mild-moderate LV dilation, normal RV.  Echo in 12/23 showed EF 25-30%, global hypokinesis, LV mildly dilated, mildly decreased RV systolic function, IVC normal. Echo in 3/25 with EF 25-30%. S/p ICD 10/25. Echo 1/26: EF 20%, RV mildly down, possible LV thrombus. NYHA class III. Volume not significantly up on exam but weight is up 5 lb and OptiVol starting to trend up. Takes 80 mg furosemide  daily. Take an extra 40 mg X 3 days with an extra 20 mEq potassium chloride . - Continue spironolactone  25 mg daily.   - Continue Toprol  XL 100 mg bid.   - Continue Bidil  2 tabs tid.   - Continue Entresto  97/103 bid.    - She is off Farxiga  due to  multiple yeast infections.  - Obtain CPX to risk stratify - cMRI to definitively assess for LV thrombus 2. Rheumatoid arthritis: She sees a rheumatologist, now on Enbrel.  3. Obesity: Body mass index is 39.63 kg/m. - Continue Mounjaro. 4. Palpitations: Zio 6/25: 2 short NSVT runs and 3 SVTs. - No symptoms today - No arrhythmias on device 5. Possible LV thrombus vs calcified chord - Noted on echo 04/24/24 - Obtain cMRI - Start eliquis  5 mg BID - discussed with Dr. Rolan  Completed paperwork for accommodations through her employer. No pushing, pulling or lifting > 15 lbs, can reassess at f/u pending results of CPX.   Follow up: Keep appointment as scheduled in March  [facility], [place] N PA-C 04/27/2024

## 2024-04-27 NOTE — Patient Instructions (Signed)
Medication Changes:  START ELIQUIS  5MG  TWICE DAILY   TAKE AN EXTRA 40MG  OF FUROSEMIDE  FOR THE NEXT 3 DAYS   TAKE AN EXTRA OF POTASSIUM WITH FUROSEMIDE  FOR THE NEXT 3 DAYS   Testing/Procedures:  AS SCHEDULED Expect to be in the lab for 2 hours. Please plan to arrive 30 minutes prior to your appointment. You may be asked to reschedule your test if you arrive 20 minutes or more after your scheduled appointment time.  Main Campus address: [address] [place], [place] [address] You may arrive to the Main Entrance A or Entrance C (free valet parking is available at both). -Main Entrance A (on [address]) :proceed to admitting for check in -Entrance C (on [employer]): proceed to [employer] parking or under hospital deck parking using this code _________  Check In: [facility] waiting room (1st floor)   General Instructions for the day of the test (Please follow all instructions from your physician): Refrain from ingesting a heavy meal, alcohol, or caffeine or using tobacco products within 2 hours of the test (DO NOT FAST for mare than 8 hours). You may have all other non-alcoholic, non -caffeinated beverage,a light snack (crackers,a piece of fruit, carrot sticks, toast bagel,etc) up to your appointment. Avoid significant exertion or exercise within 24 hours of your test. Be prepared to exercise and sweat. Your clothing should permit freedom of movement and include walking or running shoes. Women bring loose fitting short sleeved blouse.  This evaluation may be fatiguing and you may wish ti have someone accompany you to the assessment to drive you home afterward. Bring a list of your medications with you, including dosage and frequency you take the medications (  I.e.,once per day, twice per day, etc). Take all medications as prescribed, unless noted below or instructed to do so by your physician.  Please do not take the following medications prior to your  CPX:  _________________________________________________  _________________________________________________  Brief description of the test: A brief lung test will be performed. This will involve you taking deep breaths and blowing hard and fast through your mouth. During these , a clip will be on your nose and you will be breathing through a breathing device.   For the exercise portion of the test you will be walking on a treadmill, or riding a stationary bike, to your maximal effor or until symptoms such as chest pain, shortness of breath, leg pain or dizziness limit your exercise. You will be breathing in and out of a breathing device through your mouth (a clip will be on your nose again). Your heart rate, ECG, blood pressure, oxygen saturations, breathing rate and depth, amount of oxygen you consume and amount of carbon dioxide you produce will be measured and monitored throughout the exercise test.  If you need to cancel or reschedule your appointment please call [phone] If you have further questions please call your physician or Josette Pesa, MS, ACSM-RCEP at [phone]   SOMEONE FROM [facility] WILL REACH OUT TO [facility] CARDIAC MRI  [facility] [address] [place], [place] [address] Please take advantage of the free valet parking available at the [facility] and [employer] (Entrance C).  Proceed to the [facility] Radiology Department (First Floor) for check-in.   Magnetic resonance imaging (MRI) is a painless test that produces images of the inside of the body without using Xrays.  During an MRI, strong magnets and radio waves work together in a data processing manager to form  detailed images.   MRI images may provide more details about a medical condition than X-rays, CT scans, and ultrasounds can provide.  You may be given earphones to listen for instructions.  You may eat a light breakfast and take medications as ordered with the exception of furosemide ,  hydrochlorothiazide, chlorthalidone or spironolactone  (or any other fluid pill). If you are undergoing a stress MRI, please avoid stimulants for 12 hr prior to test. (I.e. Caffeine, nicotine, chocolate, or antihistamine medications)  If your provider has ordered anti-anxiety medications for this test, then you will need a driver.  An IV will be inserted into one of your veins. Contrast material will be injected into your IV. It will leave your body through your urine within a day. You may be told to drink plenty of fluids to help flush the contrast material out of your system.  You will be asked to remove all metal, including: Watch, jewelry, and other metal objects including hearing aids, hair pieces and dentures. Also wearable glucose monitoring systems (ie. Freestyle Libre and Omnipods) (Braces and fillings normally are not a problem.)   TEST WILL TAKE APPROXIMATELY 1 HOUR  PLEASE NOTIFY SCHEDULING AT LEAST 24 HOURS IN ADVANCE IF YOU ARE UNABLE TO KEEP YOUR APPOINTMENT. [phone]  For more information and frequently asked questions, please visit our website : [email or web address]  Please call the Cardiac Imaging Nurse Navigators with any questions/concerns. [phone] Office   Follow-Up in: AS SCHEDULED IN MARCH   At the [facility], you and your health needs are our priority. We have a designated team specialized in the treatment of Heart Failure. This Care Team includes your primary Heart Failure Specialized Cardiologist (physician), Advanced Practice Providers (APPs- Physician Assistants and Nurse Practitioners), and Pharmacist who all work together to provide you with the care you need, when you need it.   You may see any of the following providers on your designated Care Team at your next follow up:  Dr. Toribio Fuel Dr. Ezra Shuck Dr. Odis Brownie Greig Mosses, NP Caffie Shed, [place] [facility], [place] Beckey Coe,  NP Bittinger Lee, NP Tinnie Redman, PharmD   Please be sure to bring in all your medications bottles to every appointment.   Need to Contact Us :  If you have any questions or concerns before your next appointment please send us  a message through [place] or call our office at [phone].    TO LEAVE A MESSAGE FOR THE NURSE SELECT OPTION 2, PLEASE LEAVE A MESSAGE INCLUDING: YOUR NAME DATE OF BIRTH CALL BACK NUMBER REASON FOR CALL**this is important as we prioritize the call backs  YOU WILL RECEIVE A CALL BACK THE SAME DAY AS LONG AS YOU CALL BEFORE 4:00 PM

## 2024-05-02 ENCOUNTER — Encounter (HOSPITAL_COMMUNITY)

## 2024-06-06 ENCOUNTER — Ambulatory Visit

## 2024-06-28 ENCOUNTER — Ambulatory Visit (HOSPITAL_COMMUNITY)

## 2024-09-05 ENCOUNTER — Ambulatory Visit

## 2024-12-05 ENCOUNTER — Ambulatory Visit

## 2025-03-06 ENCOUNTER — Ambulatory Visit
